# Patient Record
Sex: Female | Born: 1960
Health system: Southern US, Community
[De-identification: ages and names within clinical notes are randomized; demographics above are authoritative.]

## PROBLEM LIST (undated history)

## (undated) DIAGNOSIS — K289 Gastrojejunal ulcer, unspecified as acute or chronic, without hemorrhage or perforation: Secondary | ICD-10-CM

## (undated) DIAGNOSIS — E785 Hyperlipidemia, unspecified: Secondary | ICD-10-CM

## (undated) DIAGNOSIS — E669 Obesity, unspecified: Secondary | ICD-10-CM

## (undated) DIAGNOSIS — K219 Gastro-esophageal reflux disease without esophagitis: Secondary | ICD-10-CM

## (undated) DIAGNOSIS — I10 Essential (primary) hypertension: Secondary | ICD-10-CM

## (undated) DIAGNOSIS — Z8719 Personal history of other diseases of the digestive system: Secondary | ICD-10-CM

## (undated) DIAGNOSIS — F329 Major depressive disorder, single episode, unspecified: Secondary | ICD-10-CM

## (undated) DIAGNOSIS — F32A Depression, unspecified: Secondary | ICD-10-CM

## (undated) DIAGNOSIS — K589 Irritable bowel syndrome without diarrhea: Secondary | ICD-10-CM

## (undated) DIAGNOSIS — K635 Polyp of colon: Secondary | ICD-10-CM

## (undated) DIAGNOSIS — K5790 Diverticulosis of intestine, part unspecified, without perforation or abscess without bleeding: Secondary | ICD-10-CM

## (undated) DIAGNOSIS — D649 Anemia, unspecified: Secondary | ICD-10-CM

## (undated) DIAGNOSIS — F419 Anxiety disorder, unspecified: Secondary | ICD-10-CM

## (undated) HISTORY — DX: Obesity, unspecified: E66.9

## (undated) HISTORY — DX: Major depressive disorder, single episode, unspecified: F32.9

## (undated) HISTORY — PX: PIP JOINT FUSION: SHX2238

## (undated) HISTORY — DX: Diverticulosis of intestine, part unspecified, without perforation or abscess without bleeding: K57.90

## (undated) HISTORY — DX: Gastrojejunal ulcer, unspecified as acute or chronic, without hemorrhage or perforation: K28.9

## (undated) HISTORY — DX: Polyp of colon: K63.5

## (undated) HISTORY — DX: Anxiety disorder, unspecified: F41.9

## (undated) HISTORY — DX: Anemia, unspecified: D64.9

## (undated) HISTORY — PX: ABDOMINAL HYSTERECTOMY: SHX81

## (undated) HISTORY — DX: Irritable bowel syndrome, unspecified: K58.9

## (undated) HISTORY — DX: Personal history of other diseases of the digestive system: Z87.19

## (undated) HISTORY — DX: Depression, unspecified: F32.A

---

## 2009-05-04 HISTORY — PX: COLONOSCOPY WITH ESOPHAGOGASTRODUODENOSCOPY (EGD): SHX5779

## 2010-02-16 ENCOUNTER — Ambulatory Visit
Admission: RE | Admit: 2010-02-16 | Discharge: 2010-02-16 | Payer: Self-pay | Source: Home / Self Care | Admitting: Emergency Medicine

## 2010-02-16 ENCOUNTER — Encounter: Payer: Self-pay | Admitting: Emergency Medicine

## 2010-02-16 DIAGNOSIS — E119 Type 2 diabetes mellitus without complications: Secondary | ICD-10-CM | POA: Insufficient documentation

## 2010-02-16 DIAGNOSIS — K219 Gastro-esophageal reflux disease without esophagitis: Secondary | ICD-10-CM

## 2010-02-16 DIAGNOSIS — M25561 Pain in right knee: Secondary | ICD-10-CM

## 2010-02-16 DIAGNOSIS — E1140 Type 2 diabetes mellitus with diabetic neuropathy, unspecified: Secondary | ICD-10-CM

## 2010-02-16 DIAGNOSIS — I10 Essential (primary) hypertension: Secondary | ICD-10-CM

## 2010-02-16 DIAGNOSIS — M25569 Pain in unspecified knee: Secondary | ICD-10-CM | POA: Insufficient documentation

## 2010-02-16 DIAGNOSIS — M25562 Pain in left knee: Secondary | ICD-10-CM

## 2010-02-16 DIAGNOSIS — E785 Hyperlipidemia, unspecified: Secondary | ICD-10-CM

## 2010-02-16 DIAGNOSIS — F3289 Other specified depressive episodes: Secondary | ICD-10-CM | POA: Insufficient documentation

## 2010-02-16 DIAGNOSIS — F329 Major depressive disorder, single episode, unspecified: Secondary | ICD-10-CM | POA: Insufficient documentation

## 2010-02-16 HISTORY — DX: Pain in left knee: M25.562

## 2010-02-16 HISTORY — DX: Major depressive disorder, single episode, unspecified: F32.9

## 2010-02-16 HISTORY — DX: Other specified depressive episodes: F32.89

## 2010-02-16 HISTORY — DX: Type 2 diabetes mellitus with diabetic neuropathy, unspecified: E11.40

## 2010-02-16 HISTORY — DX: Essential (primary) hypertension: I10

## 2010-02-16 HISTORY — DX: Gastro-esophageal reflux disease without esophagitis: K21.9

## 2010-02-16 HISTORY — DX: Pain in right knee: M25.561

## 2010-02-16 HISTORY — DX: Hyperlipidemia, unspecified: E78.5

## 2010-02-22 NOTE — Assessment & Plan Note (Signed)
Summary: INJURY TO KNEE room 4   Vital Signs:  Patient Profile:   50 Years Old Female CC:      knee injury Height:     60 inches Weight:      166.50 pounds O2 Sat:      95 % O2 treatment:    Room Air Temp:     98.7 degrees F oral Pulse rate:   88 / minute Resp:     16 per minute BP sitting:   123 / 83  (left arm) Cuff size:   regular  Pt. in pain?   yes    Location:   knee    Intensity:   6    Type:       burning  Vitals Entered By: Clemens Catholic LPN (February 16, 2010 11:16 AM)                   Updated Prior Medication List: NEXIUM 40 MG CPDR (ESOMEPRAZOLE MAGNESIUM)  CELEXA 40 MG TABS (CITALOPRAM HYDROBROMIDE)  METFORMIN HCL 500 MG TABS (METFORMIN HCL)  VICTOZA 18 MG/3ML SOLN (LIRAGLUTIDE)  MAXZIDE 75-50 MG TABS (TRIAMTERENE-HCTZ)  TARKA 1-240 MG CR-TABS (TRANDOLAPRIL-VERAPAMIL HCL)  TOPROL XL 25 MG XR24H-TAB (METOPROLOL SUCCINATE)  LOVASTATIN 20 MG TABS (LOVASTATIN)   Current Allergies: ! AMPICILLINHistory of Present Illness History from: patient Chief Complaint: knee injury History of Present Illness: L knee injury at work.  Was walking down the hall, turned the corner, and while pivoting felt pain.  Immediate pain described as constant dull burning, worse with movement.  Mild swelling later on.  Has been icing and resting and using Ibu 600 which is helping.  No locking, popping, giving way.  REVIEW OF SYSTEMS Constitutional Symptoms      Denies fever, chills, night sweats, weight loss, weight gain, and fatigue.  Eyes       Complains of glasses.      Denies change in vision, eye pain, eye discharge, contact lenses, and eye surgery. Ear/Nose/Throat/Mouth       Denies hearing loss/aids, change in hearing, ear pain, ear discharge, dizziness, frequent runny nose, frequent nose bleeds, sinus problems, sore throat, hoarseness, and tooth pain or bleeding.  Respiratory       Denies dry cough, productive cough, wheezing, shortness of breath, asthma, bronchitis, and  emphysema/COPD.  Cardiovascular       Denies murmurs, chest pain, and tires easily with exhertion.    Gastrointestinal       Denies stomach pain, nausea/vomiting, diarrhea, constipation, blood in bowel movements, and indigestion. Genitourniary       Denies painful urination, kidney stones, and loss of urinary control. Neurological       Denies paralysis, seizures, and fainting/blackouts. Musculoskeletal       Denies muscle pain, joint pain, joint stiffness, decreased range of motion, redness, swelling, muscle weakness, and gout.  Skin       Denies bruising, unusual mles/lumps or sores, and hair/skin or nail changes.  Psych       Denies mood changes, temper/anger issues, anxiety/stress, speech problems, depression, and sleep problems. Other Comments: pt states that she injured her  LT knee yesterday. she was walking, turned the corner and heard a pop sound. pain is constant dull/burning, pain level 6. with movement pain level 10, sharp pain. she applied ice immediately, continued to ice through out the night, IBF 600mg  q 6 hrs with no relief.   Past History:  Past Medical History: Depression Diabetes mellitus, type II GERD Hyperlipidemia  Hypertension  Past Surgical History: Hysterectomy  Family History: Family History Diabetes 1st degree relative  Social History: Never Smoked Alcohol use-no Drug use-no Smoking Status:  never Drug Use:  no Physical Exam General appearance: well developed, well nourished, limping gait Extremities: OA nodules DIPs Skin: no obvious rashes or lesions MSE: oriented to time, place, and person L knee: FROM but painful with flexion and bounce test, no effusion, no ecchymoses, Lachmans normal (although patient is guarding), Anterior & posterior drawer normal, McMurrays is painful, Varus & valgus stress normal.  Patella freely mobile, Clarks compression test normal.  Good alignment.   She is tender both lateral and medial joint lines, and mildly TTP  along LCL and at pes anserine bursa.  Distal NV status intact. Assessment New Problems: KNEE PAIN, LEFT, ACUTE (ICD-719.46) FAMILY HISTORY DIABETES 1ST DEGREE RELATIVE (ICD-V18.0) HYPERTENSION (ICD-401.9) HYPERLIPIDEMIA (ICD-272.4) GERD (ICD-530.81) DIABETES MELLITUS, TYPE II (ICD-250.00) DEPRESSION (ICD-311)   Plan New Orders: New Patient Level III [99203] T-DG Knee Complete 4 Views*L* [16109] Planning Comments:   Ice, rest, compression, elevation.  Motrin as needed. Will refer to Dr. Margaretha Sheffield sports med for eval next week.  I suspect that she has a meniscal injury. Will get started with PT in the meantime Xray today is normal other than some mild medial compartment narrowing   The patient and/or caregiver has been counseled thoroughly with regard to medications prescribed including dosage, schedule, interactions, rationale for use, and possible side effects and they verbalize understanding.  Diagnoses and expected course of recovery discussed and will return if not improved as expected or if the condition worsens. Patient and/or caregiver verbalized understanding.   Orders Added: 1)  New Patient Level III [99203] 2)  T-DG Knee Complete 4 Views*L* [60454]

## 2010-02-22 NOTE — Letter (Signed)
Summary: Internal Other  Internal Other   Imported By: Dannette Barbara 02/16/2010 12:31:44  _____________________________________________________________________  External Attachment:    Type:   Image     Comment:   External Document

## 2011-02-02 ENCOUNTER — Emergency Department
Admission: EM | Admit: 2011-02-02 | Discharge: 2011-02-02 | Disposition: A | Discharge status: Home / Self Care | Payer: 59 | Source: Home / Self Care | Attending: Emergency Medicine | Admitting: Emergency Medicine

## 2011-02-02 ENCOUNTER — Emergency Department: Admit: 2011-02-02 | Discharge: 2011-02-02 | Disposition: A | Discharge status: Home / Self Care | Payer: 59

## 2011-02-02 DIAGNOSIS — M25539 Pain in unspecified wrist: Secondary | ICD-10-CM

## 2011-02-02 DIAGNOSIS — M25579 Pain in unspecified ankle and joints of unspecified foot: Secondary | ICD-10-CM

## 2011-02-02 DIAGNOSIS — M25532 Pain in left wrist: Secondary | ICD-10-CM

## 2011-02-02 DIAGNOSIS — M79609 Pain in unspecified limb: Secondary | ICD-10-CM

## 2011-02-02 DIAGNOSIS — M25571 Pain in right ankle and joints of right foot: Secondary | ICD-10-CM

## 2011-02-02 DIAGNOSIS — M79605 Pain in left leg: Secondary | ICD-10-CM

## 2011-02-02 DIAGNOSIS — M545 Low back pain, unspecified: Secondary | ICD-10-CM

## 2011-02-02 HISTORY — DX: Hyperlipidemia, unspecified: E78.5

## 2011-02-02 HISTORY — DX: Gastro-esophageal reflux disease without esophagitis: K21.9

## 2011-02-02 HISTORY — DX: Essential (primary) hypertension: I10

## 2011-02-02 MED ORDER — CYCLOBENZAPRINE HCL 10 MG PO TABS
10.0000 mg | ORAL_TABLET | Freq: Three times a day (TID) | ORAL | Status: DC | PRN
Start: 2011-02-02 — End: 2011-02-12

## 2011-02-02 MED ORDER — TRAMADOL HCL 50 MG PO TABS: 50.0000 mg | ORAL_TABLET | Freq: Three times a day (TID) | ORAL | Status: AC | PRN

## 2011-02-02 NOTE — ED Notes (Signed)
Fell in [place] this am

## 2011-02-02 NOTE — ED Provider Notes (Signed)
History     CSN: [id number]  Arrival date & time 02/02/11  1417   First MD Initiated Contact with Patient 02/02/11 1448      Chief Complaint  Patient presents with  . Fall    (Consider location/radiation/quality/duration/timing/severity/associated sxs/prior treatment) HPI She was walking to the front door of [employer] today about 2 hours ago and fell on a wet floor. When she fell hurt left leg went forward in her shin hit the shopping cart. Her right leg went backwards and she feels that she twisted her ankle. She also feels some tightness in her lower back. What most concerned her is her left wrist pain. She has recently had left wrist and left fifth finger surgery and just got out of a cast. Her pain was baseline 3/10 but has since increased to about 7/10 after the injury. She is here and would like to have that left wrist checked out and x-rayed. She did not hit her head, no headache or loss of consciousness. She was able to get up and walk away on her own power.  Past Medical History  Diagnosis Date  . Hypertension   . Diabetes mellitus   . Hyperlipidemia   . Gastro-esophageal reflux     Past Surgical History  Procedure Date  . Pip joint fusion     pinky  . Abdominal hysterectomy     Family History  Problem Relation Age of Onset  . Diabetes Mother   . Heart attack Father     History  Substance Use Topics  . Smoking status: Never Smoker   . Smokeless tobacco: Not on file  . Alcohol Use: No    OB History    Grav Para Term Preterm Abortions TAB SAB Ect Mult Living                  Review of Systems  Allergies  Ampicillin; Cefzil; and Mobic  Home Medications   Current Outpatient Rx  Name Route Sig Dispense Refill  . CITALOPRAM HYDROBROMIDE 40 MG PO TABS Oral Take 40 mg by mouth daily.    Marland Kitchen ESOMEPRAZOLE MAGNESIUM 40 MG PO CPDR Oral Take 40 mg by mouth daily before breakfast.    . FENOFIBRATE 54 MG PO TABS Oral Take 54 mg by mouth daily. Unsure of dosage      . INSULIN ASPART 100 UNIT/ML [place] SOLN Subcutaneous Inject 12 Units into the skin 3 (three) times daily before meals.    . INSULIN GLARGINE 100 UNIT/ML [place] SOLN Subcutaneous Inject 65 Units into the skin at bedtime.    Marland Kitchen LOVASTATIN 10 MG PO TABS Oral Take 10 mg by mouth at bedtime.    . TRANDOLAPRIL-VERAPAMIL HCL ER 2-240 MG PO TBCR Oral Take 1 tablet by mouth daily.    . TRIAMTERENE-HCTZ 75-50 MG PO TABS Oral Take 1 tablet by mouth daily.    . CYCLOBENZAPRINE HCL 10 MG PO TABS Oral Take 1 tablet (10 mg total) by mouth 3 (three) times daily as needed for muscle spasms. 15 tablet 0  . TRAMADOL HCL 50 MG PO TABS Oral Take 1 tablet (50 mg total) by mouth every 8 (eight) hours as needed for pain. 24 tablet 0    BP 136/85  Pulse 77  Temp(Src) 97.9 F (36.6 C) (Oral)  Resp 20  Ht 5' (1.524 m)  Wt 173 lb (78.472 kg)  BMI 33.79 kg/m2  SpO2 98%  Physical Exam  Nursing note and vitals reviewed. Constitutional: She is oriented to  person, place, and time. She appears well-developed and well-nourished.  HENT:  Head: Normocephalic and atraumatic.  Eyes: No scleral icterus.  Neck: Neck supple.  Cardiovascular: Regular rhythm and normal heart sounds.   Pulmonary/Chest: Effort normal and breath sounds normal. No respiratory distress.  Musculoskeletal:       R ankle/foot: FROM, +TTP ATFL and mild anterior ankle.   No TTP medial/lateral malleolus, navicular, base of 5th, calcaneus, Achilles, proximal fibula.  No swelling.  No ecchymoses.  Distal neurovascular status is intact.   Right shin examination shows tenderness and ecchymoses on the anterior aspect. Mild bony tenderness on the anterior tibia, no crepitus. Her gait is normal.  Lower back examination demonstrates a mild tenderness and spasm of the paraspinal muscles. No bony central tenderness. Straight leg raise negative.  Left wrist examination demonstrates some tenderness around the snuffbox and on the radial aspect around the [facility] joint. There  are multiple surgical scars. Her range of motion is limited by her original disability per her.  The distal neurovascular status of all extremities is intact and normal.  Neurological: She is alert and oriented to person, place, and time.  Skin: Skin is warm and dry.  Psychiatric: She has a normal mood and affect. Her speech is normal.    ED Course  Procedures (including critical care time)  Labs Reviewed - No data to display Dg Wrist Complete Left  02/02/2011  *RADIOLOGY REPORT*  Clinical Data: Fall  LEFT WRIST - COMPLETE 3+ VIEW  Comparison: None.  Findings: Status post resection of the trapezium.  Plate and screws are present within the proximal phalanx of the small finger.  No acute fracture or dislocation.  IMPRESSION: No acute bony pathology.  Original Report Authenticated By: Donavan Burnet, M.D.     1. Left wrist pain   2. Right ankle pain   3. Lumbago   4. Left leg pain       MDM   An x-ray was obtained today of her left wrist and is read by the radiologist as above. I've encouraged her to use rest, ice, elevation, and to put her wrist splint on for the next few days. I have also advised her to call her orthopedist and let them know that she fell and injured her wrist. For her other injuries such as the left shin contusion, ankle pain, and lower back pain, I do not feel that we need to get x-rays today. The patient agrees with me and states that she would only like to x-ray the left wrist because that is what she is most worried about since she recently had surgery on it. However, in a few days if she is noticing continued pain in any not improvement and the others, she should followup with her physician to have additional x-rays done. If there is going to be any kind of legal action, that x-rays would also be appropriate at that time or sooner. In the meantime, I believe these are just contusions and mild strains and I would expect that she will be sore for a few days at least.   Rx for Tramadol given for pain and Flexeril given if needed for her back.  Should also use a heating pad for her back.  Lily Kocher, MD 02/02/11 1550

## 2011-05-27 ENCOUNTER — Encounter: Payer: Self-pay | Admitting: Emergency Medicine

## 2011-05-27 ENCOUNTER — Emergency Department
Admission: EM | Admit: 2011-05-27 | Discharge: 2011-05-27 | Disposition: A | Discharge status: Home / Self Care | Payer: 59 | Source: Home / Self Care | Attending: Family Medicine | Admitting: Family Medicine

## 2011-05-27 DIAGNOSIS — M940 Chondrocostal junction syndrome [Tietze]: Secondary | ICD-10-CM

## 2011-05-27 DIAGNOSIS — J069 Acute upper respiratory infection, unspecified: Secondary | ICD-10-CM

## 2011-05-27 MED ORDER — GUAIFENESIN-CODEINE 100-10 MG/5ML PO SYRP: 10.0000 mL | ORAL_SOLUTION | Freq: Every day | ORAL | Status: AC

## 2011-05-27 MED ORDER — SULFAMETHOXAZOLE-TRIMETHOPRIM 800-160 MG PO TABS: 1.0000 | ORAL_TABLET | Freq: Two times a day (BID) | ORAL | Status: AC

## 2011-05-27 NOTE — Discharge Instructions (Signed)
Take plain Mucinex (guaifenesin) twice daily for cough and congestion.  Increase fluid intake, rest. May use Afrin nasal spray (or generic oxymetazoline) twice daily for about 5 days.  Also recommend using saline nasal spray several times daily and saline nasal irrigation (AYR is a common brand) Stop all antihistamines for now, and other non-prescription cough/cold preparations. May take Ibuprofen 200mg , 4 tabs every 8 hours with food for chest/sternum discomfort. Recommend a Tdap when well.  Follow-up with family doctor if not improving 7 to 10 days.

## 2011-05-27 NOTE — ED Provider Notes (Signed)
History     CSN: [id number]  Arrival date & time 05/27/11  1548   First MD Initiated Contact with Patient 05/27/11 [phone]      Chief Complaint  Patient presents with  . URI      HPI Comments: Patient complains of approximately 4 day history of gradually progressive URI symptoms beginning with a mild sore throat (now improved), followed by progressive nasal congestion.  A cough started about 2 days ago.  Complains of fatigue and initial myalgias.  Cough is now worse at night and generally non-productive during the day.  There has been no pleuritic pain but she has tightness in her anterior chest.  No wheezing or shortness of breath.  She sometimes coughs until she gags.  She notes that she has a similar respiratory illness about 3 to 4 weeks ago.  She does not remember her last tetanus shot.   The history is provided by the patient.    Past Medical History  Diagnosis Date  . Hypertension   . Diabetes mellitus   . Hyperlipidemia   . Gastro-esophageal reflux     Past Surgical History  Procedure Date  . Pip joint fusion     pinky  . Abdominal hysterectomy     Family History  Problem Relation Age of Onset  . Diabetes Mother   . Heart attack Father     History  Substance Use Topics  . Smoking status: Never Smoker   . Smokeless tobacco: Not on file  . Alcohol Use: No    OB History    Grav Para Term Preterm Abortions TAB SAB Ect Mult Living                  Review of Systems + sore throat + cough No pleuritic pain but has tightness in anterior chest No wheezing + nasal congestion + post-nasal drainage + sinus pain/pressure No itchy/red eyes ? earache No hemoptysis No SOB No fever/chills No nausea No vomiting No abdominal pain No diarrhea No urinary symptoms No skin rashes + fatigue + myalgias + headache Used OTC meds without relief (Robitussin)  Allergies  Ampicillin; Cefprozil; and Meloxicam  Home Medications   Current Outpatient Rx  Name Route  Sig Dispense Refill  . CITALOPRAM HYDROBROMIDE 40 MG PO TABS Oral Take 40 mg by mouth daily.    Marland Kitchen ESOMEPRAZOLE MAGNESIUM 40 MG PO CPDR Oral Take 40 mg by mouth daily before breakfast.    . FENOFIBRATE 54 MG PO TABS Oral Take 54 mg by mouth daily. Unsure of dosage    . GUAIFENESIN-CODEINE 100-10 MG/5ML PO SYRP Oral Take 10 mLs by mouth at bedtime. for cough 120 mL 0  . INSULIN ASPART 100 UNIT/ML [place] SOLN Subcutaneous Inject 12 Units into the skin 3 (three) times daily before meals.    . INSULIN GLARGINE 100 UNIT/ML [place] SOLN Subcutaneous Inject 65 Units into the skin at bedtime.    Marland Kitchen LOVASTATIN 10 MG PO TABS Oral Take 10 mg by mouth at bedtime.    . SULFAMETHOXAZOLE-TRIMETHOPRIM 800-160 MG PO TABS Oral Take 1 tablet by mouth 2 (two) times daily. 14 tablet 0  . TRANDOLAPRIL-VERAPAMIL HCL ER 2-240 MG PO TBCR Oral Take 1 tablet by mouth daily.    . TRIAMTERENE-HCTZ 75-50 MG PO TABS Oral Take 1 tablet by mouth daily.      BP 135/85  Pulse 80  Temp(Src) 98.6 F (37 C) (Oral)  Resp 16  Ht 5' (1.524 m)  Wt 178 lb (  80.74 kg)  BMI 34.76 kg/m2  SpO2 97%  Physical Exam Nursing notes and Vital Signs reviewed. Appearance:  Patient appears stated age, and in no acute distress.  Patient is obese (BMI 34.8) Eyes:  Pupils are equal, round, and reactive to light and accomodation.  Extraocular movement is intact.  Conjunctivae are not inflamed  Ears:  Canals normal.  Tympanic membranes normal.  Nose:  Mildly congested turbinates.  Maxillary sinus tenderness is present.  Pharynx:  Normal Neck:  Supple.   Tender shotty anterior/posterior nodes are palpated bilaterally  Lungs:  Clear to auscultation.  Breath sounds are equal.  Chest:  Distinct tenderness to palpation over the mid-sternum.  Heart:  Regular rate and rhythm without murmurs, rubs, or gallops.  Abdomen:  Nontender without masses or hepatosplenomegaly.  Bowel sounds are present.  No CVA or flank tenderness.  Extremities:  No edema.  No calf  tenderness Skin:  No rash present.   ED Course  Procedures  none      1. Acute upper respiratory infections of unspecified site   2. Costochondritis, acute       MDM  With a second respiratory illness in one month, will begin Bactrim DS.  Rx for cough suppressant at bedtime. Take plain Mucinex (guaifenesin) twice daily for cough and congestion.  Increase fluid intake, rest. May use Afrin nasal spray (or generic oxymetazoline) twice daily for about 5 days.  Also recommend using saline nasal spray several times daily and saline nasal irrigation (AYR is a common brand) Stop all antihistamines for now, and other non-prescription cough/cold preparations. May take Ibuprofen 200mg , 4 tabs every 8 hours with food for chest/sternum discomfort. Recommend a Tdap when well.  Follow-up with family doctor if not improving 7 to 10 days.        Lattie Haw, MD 05/27/11 [phone]

## 2011-05-27 NOTE — ED Notes (Signed)
Cough, Congestion, Headache, sore throat, green mucus x 5 days

## 2012-09-15 ENCOUNTER — Ambulatory Visit (HOSPITAL_COMMUNITY): Payer: 59 | Admitting: Behavioral Health

## 2012-09-17 ENCOUNTER — Encounter (HOSPITAL_COMMUNITY): Payer: Self-pay | Admitting: Behavioral Health

## 2012-09-17 ENCOUNTER — Ambulatory Visit (INDEPENDENT_AMBULATORY_CARE_PROVIDER_SITE_OTHER): Payer: 59 | Admitting: Behavioral Health

## 2012-09-17 DIAGNOSIS — F339 Major depressive disorder, recurrent, unspecified: Secondary | ICD-10-CM

## 2012-09-17 DIAGNOSIS — F331 Major depressive disorder, recurrent, moderate: Secondary | ICD-10-CM

## 2012-09-17 NOTE — Progress Notes (Signed)
Presenting Problem Chief Complaint: The client presents with multiple stressors. Her husband is out of work and due to severe anxiety. Her daughter has had issues with substance abuse. 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 There are also financial stressors as her husband was not able to work currently  What are the main stressors in your life right now? Depression  2  How long have you had these symptoms?: On and off for the last 4 or 5 years   Previous mental health services Have you ever been treated for a mental health problem? Yes  If Yes, when? Depression , where? , Health, by whom? Dr. Orson Aloe   Are you currently seeing a therapist or counselor? No If Yes, whom?   Have you ever had a mental health hospitalization? No If Yes, when?  , where? , why? , how many times?   Have you ever been treated with medication for a mental health problem? Yes If Yes, please list as completely as possible (name of medication, reason prescribed, and response:  depression see note in epic   Have you ever had suicidal thoughts or attempted suicide? None reported If Yes, when?   Describe   Risk factors for Suicide Demographic factors:  Caucasian Current mental status: Suicidal ideation  Loss factors: Financial problems/change in socioeconomic status Historical factors: No history or family history of suicide  Risk Reduction factors: Living with another person, especially a relative Clinical factors:  depression Cognitive features that contribute to risk:     SUICIDE RISK:  Minimal: No identifiable suicidal ideation.  Patients presenting with no risk factors but with morbid ruminations; may be classified as minimal risk based on the severity of the depressive symptoms   Medical history Medical treatment and/or problems: Yes If Yes, please explain see note in epic  Name of primary care physician/last physical exam:  Dr.  Orson Aloe  Chronic pain issues: No If Yes, please explain  Allergies: None reported If yes, what medications are you allergic to and what happened when taking the medication?    Current medications:  see note in epic  Prescribed by:  see note in epic   Is there any history of mental health problems or substance abuse in your family? No If Yes, please explain (include information on parents, siblings, aunts/uncles, grandparents, cousins, etc.):  Has anyone in your family been hospitalized for mental health problems?  If Yes, please explain (including who, where, and for what length of time):    Social/family history Who lives in your current household?  the client, her husband Tonya Chandler, and 40 year old daughter Tonya Chandler history: Have you ever been in the [employer]? No If Yes, when?  for how long?   Were you ever in active combat?  If Yes, when?  for how long?  Were there any lasting effects on you?  If Yes, please explain:   Religious/spiritual involvement:  What Religion are you?  Christian   Family of origin (childhood history)  Where were you born?  [facility] [place]  Where did you grow up? [place] Describe the household where you grew up: The client reports a good childhood with both parents being together  Do you have siblings, step/half siblings? Yes If Yes, please list names, sex and ages:  has 3 brothers 2 of which are older one of which his younger   Are your parents separated/divorced? No If Yes, approximately when?  clients father is deceased   Are you presently: Married How many times have you been married?  once  Dates of previous marriages:  nonapplicable  Do you have any concerns regarding marriage? No If Yes, please explain:  client is very supportive of her husband is going to a difficult time   Do you have any children? Yes If Yes, how many?  two Please list their sexes and ages:  Tonya Chandler who is 49 and Tonya Chandler who is 11  Social supports  (personal and professional):  the clients brothers, her husband and adult children, and her husband's brothers Education How many grades have you completed? college graduate Do you hold any Degrees? Yes If Yes, in what?   From where?  What were your special talents/interests in school?   Did you have any problems in school? No If Yes, were these problems behavioral, attention, or due to learning difficulties?  Were any medications ever prescribed for these problems?  If Yes, what were the medications?    Employment (financial issues) Do you work? 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  26 years   Name of employer:  [employer]  Do you enjoy your present job? Yes What is your previous work history?  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 No If Yes, please explain:    Legal history Do you have any current legal issues? no If yes, please describe:   Do you have any [ast legal issues? If yes, please describe:   Trauma/Abuse history: Have you ever been exposed to any form of abuse? No If Yes:   Have you ever been exposed to something traumatic? Yes If yes, please described:  the client reports that her father's death was traumatic for her. She described herself as a" daddy's girl." She indicated that he died 21 years ago and just in the past year as she found come to terms with it. She stated that she was with him on the day that he died and she and other family members simply thought he had the flu  when he had a major heart by the time that she got to him. She was told by the doctors at his heart exploded and that they could not say to him whether or not she had gotten into the hospital sooner. Substance use Do you use Caffeine?  If Yes, what type?  How often?   Do you use Nicotine? No What type?  Packs per day  How many years at this  frequency?   Do you use Alcohol? No If Yes, what type?  Frequency?   At what age did you take your first drink?  nonapplicable  Was this accepted by your family? NA  When was your last drink?  client does not drink  How much?   Have you ever experienced any form of withdrawal symptoms, i.e., Hallucinations, Tremors, Excessive Sweating, or Nausea or Vomiting? No If Yes, please explain:   Have you ever experienced blackouts? None reported If Yes, how frequently?   Have you ever had a DWI/DUI? No If Yes, when?   Do you have any legal charges pending involving substance abuse? No If Yes, please explain:   Have you ever used illicit drugs or taken more than prescribed? None reported If Yes, what type?  Frequency:   Date of last usage:  Have you ever experienced any withdrawal symptoms as listed above? No If Yes, please explain:   If you are not using presently, have you ever used in the past? None reported  If Yes, what types of Alcohol or other substances have you used?  Frequency  Last used:   Have you ever received treatment for Alcohol or Substance Abuse problems? No  Inpatient? No Outpatient? No What were the dates of treatment?  Where?   Have you ever been involved in any Recovery or Support Programs? No  If Yes, where?   Are you aware of your triggers to drink or use? No If Yes, please explain:   Mental Status: General Appearance Tonya Chandler:  Neat Eye Contact:  Good Motor Behavior:  Normal Speech:  Normal Level of Consciousness:  Alert Mood:  Depressed Affect:  Appropriate Anxiety Level:  Minimal Thought Process:  Coherent Thought Content:   Perception:  Normal Judgment:  Good Insight:  Present Cognition:  Orientation time Sleep:  client reports that she sleeps fairly well   Diagnosis AXIS I 296.32  AXIS II Deferred  AXIS III Past Medical History  Diagnosis Date  . Hypertension   . Diabetes mellitus   . Hyperlipidemia   . Gastro-esophageal  reflux     AXIS IV problems with primary support group/occupational  AXIS V 51-60 moderate symptoms    Plan: To provide supportive therapy for the client in dealing with stressors and depression.   __________________________________________ Signature/Date

## 2012-10-01 ENCOUNTER — Ambulatory Visit (INDEPENDENT_AMBULATORY_CARE_PROVIDER_SITE_OTHER): Payer: 59 | Admitting: Behavioral Health

## 2012-10-01 ENCOUNTER — Encounter (HOSPITAL_COMMUNITY): Payer: Self-pay | Admitting: Behavioral Health

## 2012-10-01 DIAGNOSIS — F331 Major depressive disorder, recurrent, moderate: Secondary | ICD-10-CM

## 2012-10-01 NOTE — Progress Notes (Signed)
   THERAPIST PROGRESS NOTE  Session Time: 10:00  Participation Level: Active  Behavioral Response: CasualAlertDepressed  Type of Therapy: Individual Therapy  Treatment Goals addressed: Coping  Interventions: CBT  Summary: Tonya Chandler is a 52 y.o. female who presents with depression.   Suicidal/Homicidal: Nowithout intent/plan  Therapist Response: The client presents with depression but also with the termination. She has multiple triggers including her husband who is struggling with moderate to severe anxiety, a difficult job which requires a substantial amount of travel, a part-time job related to the [employer], and a daughter who is struggling with overcoming and addiction..  The client expresses appreciation for supportive therapy. We did talk about ways in which she can find some time for herself for relaxation. She enjoys cooking. We also talked about scheduling time for hiking and, walking nature trails, getting a massage. She indicates that she has a small group for her church which is very supportive and has membership that she feel she can be very open with.  She indicates that one of her biggest stressors currently in his frustration with her daughter's lack of motivation. She indicates that her daughter has taking money from him as well as requested some of the clients medication. I told her that setting a firm limit with her daughter would be better for both of them. Asked her to not allow the daughter to have her do that credit card, do not give her any of her medication, and to directly pay for any of the daughter's school bills without allowing her any cash or access to dinner or credit card.  She reports that she is medication compliant and is sleeping fairly well.  Plan: Return again in 3 weeks.  Diagnosis: Axis I: 296.32    Axis II: Deferred    French Ana, [facility] 10/01/2012

## 2012-10-15 ENCOUNTER — Encounter (HOSPITAL_COMMUNITY): Payer: Self-pay | Admitting: Behavioral Health

## 2012-10-15 ENCOUNTER — Ambulatory Visit (INDEPENDENT_AMBULATORY_CARE_PROVIDER_SITE_OTHER): Payer: 59 | Admitting: Behavioral Health

## 2012-10-15 DIAGNOSIS — F331 Major depressive disorder, recurrent, moderate: Secondary | ICD-10-CM

## 2012-10-15 NOTE — Progress Notes (Signed)
   THERAPIST PROGRESS NOTE  Session Time: 10:00  Participation Level: Active  Behavioral Response: CasualAlertDepressed  Type of Therapy: Individual Therapy  Treatment Goals addressed: Coping  Interventions: CBT  Summary: Tonya Chandler is a 52 y.o. female who presents with depression.   Suicidal/Homicidal: Nowithout intent/plan  Therapist Response: The client presents with depression and anxiety. She indicates that her husband husband's health issues in particular anxiety have been very difficult. He is waiting to hear from his insurance company to see if he has been approved for disability and indicates that has been difficult for everyone. She also indicates that she struggles with her daughter's choices and lack of willingness to take responsibility or actively look for work. The client reports that her work is going very well and at times that is a play she can rest and escape. She does admit to being overwhelmed indicates that she has no thoughts of hurting herself or anyone else. We spent significant time talking about coping skills for the client, the client taking time for herself, and how she can set healthy limits.  Plan: Return again in 2 weeks.  Diagnosis: Axis I: 296.32    Axis II: Deferred    French Ana, [facility] 10/15/2012

## 2012-11-02 ENCOUNTER — Ambulatory Visit (HOSPITAL_COMMUNITY): Payer: Self-pay | Admitting: Behavioral Health

## 2012-11-05 ENCOUNTER — Ambulatory Visit (INDEPENDENT_AMBULATORY_CARE_PROVIDER_SITE_OTHER): Payer: 59 | Admitting: Behavioral Health

## 2012-11-05 ENCOUNTER — Encounter (HOSPITAL_COMMUNITY): Payer: Self-pay | Admitting: Behavioral Health

## 2012-11-05 DIAGNOSIS — F331 Major depressive disorder, recurrent, moderate: Secondary | ICD-10-CM

## 2012-11-05 NOTE — Progress Notes (Signed)
   THERAPIST PROGRESS NOTE  Session Time: 10:00  Participation Level: Active  Behavioral Response: Well GroomedAlertpleasant  Type of Therapy: Individual Therapy  Treatment Goals addressed: Coping  Interventions: CBT  Summary: Tonya Chandler is a 52 y.o. female who presents with depression.   Suicidal/Homicidal: Nowithout intent/plan  Therapist Response: The client stated that she was fired from her job very presented very calmly and at peace. She reported that she had been with the company 26 years and did not like the way the dismissal was handled but is not angry and is not better. She indicates that she was given a fair substance and is not concerned about finding work up immediately. 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 As she restricts that her husband found out that he had been approved for short-term disability to that also reduced a significant amount of stress from she and her family also. The client does indicate that she knows she will have to look for work fairly soon but in the meantime she is looking for to time with her family and an upcoming mission trip. She says that she does not have any major financial concerns. She contracts for safety having no thoughts of hurting herself or anyone else.  Plan: Return again in 3 weeks.  Diagnosis: Axis I: 296.32    Axis II: Deferred    Jay Haskew M, LPC 11/05/2012

## 2012-11-23 ENCOUNTER — Ambulatory Visit (INDEPENDENT_AMBULATORY_CARE_PROVIDER_SITE_OTHER): Payer: 59 | Admitting: Behavioral Health

## 2012-11-23 DIAGNOSIS — F331 Major depressive disorder, recurrent, moderate: Secondary | ICD-10-CM

## 2012-11-24 ENCOUNTER — Encounter (HOSPITAL_COMMUNITY): Payer: Self-pay | Admitting: Behavioral Health

## 2012-11-24 NOTE — Progress Notes (Signed)
   THERAPIST PROGRESS NOTE  Session Time: 2:00  Participation Level: Active  Behavioral Response: CasualAlertDepressed  Type of Therapy: Individual Therapy  Treatment Goals addressed: Coping  Interventions: CBT  Summary: Tonya Chandler is a 52 y.o. female who presents with depression.   Suicidal/Homicidal: Nowithout intent/plan  Therapist Response: The client presented with some frustration related to family matters. She indicates that her husband still struggles with some anxiety but feels that he is making progress. She said that her daughter is making poor decisions which has affected the whole family and created some trauma within the home. We talked about setting healthy limits with her daughter in order to protect herself.  She states that she is at peace with her job situation. She is going to a class related to the job search and had hunting opportunities this week which her former employer pay for. She states her is no financial stress now and feels confident that she will find work in an area that she is comfortable with. She is using this time to look closely what she wants to do her recognizes at this point in her work career she is looking forward to an 8-5 job in which she does not have to travel or take her job with her when she goes home.  She states that she is medication compliant and has no thoughts of hurting herself or anyone else.  Plan: Return again in 2 weeks.  Diagnosis: Axis I: 296.32    Axis II: Deferred    French Ana, [facility] 11/24/2012

## 2012-12-07 ENCOUNTER — Ambulatory Visit (INDEPENDENT_AMBULATORY_CARE_PROVIDER_SITE_OTHER): Payer: 59 | Admitting: Behavioral Health

## 2012-12-07 ENCOUNTER — Encounter (HOSPITAL_COMMUNITY): Payer: Self-pay | Admitting: Behavioral Health

## 2012-12-07 DIAGNOSIS — F331 Major depressive disorder, recurrent, moderate: Secondary | ICD-10-CM

## 2012-12-07 NOTE — Progress Notes (Signed)
   THERAPIST PROGRESS NOTE  Session Time: 11:00  Participation Level: Active  Behavioral Response: NeatAlertDepressed  Type of Therapy: Individual Therapy/grief  Treatment Goals addressed: Coping  Interventions: CBT  Summary: Tonya Chandler is a 52 y.o. female who presents with depression.   Suicidal/Homicidal: Nowithout intent/plan  Therapist Response: The client presents with depression indicated that her mother died last week. She indicated that she had been increasing stages of dementia for the past few years but had gone downhill significantly over the past few weeks and only live 6 days after being transferred to hospice. We did help the client process her grief issues related to her mother's death. She indicated that she typically doesn't requesting busy and so she is beginning this week to get back to looking for active work for herself. She does report some stressors with her daughter's he gave years but indicates that she and her husband are doing a much better job of setting limits for her daughter.  The client reports that she is medication compliant and that she has no thoughts of hurting herself or anyone else. She reports a very good support group with her husband and Bible study group as well as her church and extended family.  Plan: Return again in 3 weeks.  Diagnosis: Axis I: 296.32    Axis II: Deferred    French Ana, [facility] 12/07/2012

## 2012-12-21 ENCOUNTER — Ambulatory Visit (HOSPITAL_COMMUNITY): Payer: Self-pay | Admitting: Behavioral Health

## 2013-02-18 ENCOUNTER — Encounter (HOSPITAL_COMMUNITY): Payer: Self-pay | Admitting: Behavioral Health

## 2013-02-18 NOTE — Progress Notes (Unsigned)
Patient ID: Tonya Chandler, female   DOB: 09-10-1960, 53 y.o.   MRN: [id number]   Outpatient Therapist Discharge Summary  Tyria Springer    03-17-1960   Admission Date:    Discharge Date:   Reason for Discharge:  Client reported that she is doing well and did not feel the need to continue. Diagnosis:  Axis I:  296.32  Axis II:  deferred  Axis III:  None reported  Axis IV:    Axis V:    Comments:    Sabas Sous

## 2013-04-18 ENCOUNTER — Encounter: Payer: Self-pay | Admitting: Emergency Medicine

## 2013-04-18 ENCOUNTER — Emergency Department (INDEPENDENT_AMBULATORY_CARE_PROVIDER_SITE_OTHER)
Admission: EM | Admit: 2013-04-18 | Discharge: 2013-04-18 | Disposition: A | Discharge status: Home / Self Care | Payer: 59 | Source: Home / Self Care | Attending: Family Medicine | Admitting: Family Medicine

## 2013-04-18 DIAGNOSIS — H9312 Tinnitus, left ear: Secondary | ICD-10-CM

## 2013-04-18 DIAGNOSIS — H9319 Tinnitus, unspecified ear: Secondary | ICD-10-CM

## 2013-04-18 MED ORDER — PREDNISONE 20 MG PO TABS: 20.0000 mg | ORAL_TABLET | Freq: Two times a day (BID) | ORAL | Status: DC

## 2013-04-18 NOTE — Discharge Instructions (Signed)
Tinnitus  Sounds you hear in your ears and coming from within the ear is called tinnitus. This can be a symptom of many ear disorders. It is often associated with hearing loss.   Tinnitus can be seen with:  · Infections.  · Ear blockages such as wax buildup.  · Meniere's disease.  · Ear damage.  · Inherited.  · Occupational causes.  While irritating, it is not usually a threat to health. When the cause of the tinnitus is wax, infection in the middle ear, or foreign body it is easily treated. Hearing loss will usually be reversible.   TREATMENT   When treating the underlying cause does not get rid of tinnitus, it may be necessary to get rid of the unwanted sound by covering it up with more pleasant background noises. This may include music, the radio etc. There are tinnitus maskers which can be worn which produce background noise to cover up the tinnitus.  Avoid all medications which tend to make tinnitus worse such as alcohol, caffeine, aspirin, and nicotine. There are many soothing background tapes such as rain, ocean, thunderstorms, etc. These soothing sounds help with sleeping or resting.  Keep all follow-up appointments and referrals. This is important to identify the cause of the problem. It also helps avoid complications, impaired hearing, disability, or chronic pain.  Document Released: 01/07/2005 Document Revised: 04/01/2011 Document Reviewed: 08/26/2007  [employer]® Patient Information ©2014 [employer].

## 2013-04-18 NOTE — ED Provider Notes (Signed)
CSN: [id number]     Arrival date & time 04/18/13  1453 History   First MD Initiated Contact with Patient 04/18/13 1520     Chief Complaint  Patient presents with  . Tinnitus      HPI Comments: Patient complains of one week history of ringing in her left ear which she describes as a "swishy" tone.  The tone is more obvious when she is in quiet areas.  She believes that her hearing is slightly decreased, but no earache.  She feels slightly dizzy at times.  No recent sinus congestion or URI, and she feels well otherwise.  The history is provided by the patient.    Past Medical History  Diagnosis Date  . Hypertension   . Diabetes mellitus   . Hyperlipidemia   . Gastro-esophageal reflux   . Depression   . Anxiety    Past Surgical History  Procedure Laterality Date  . Pip joint fusion      pinky  . Abdominal hysterectomy     Family History  Problem Relation Age of Onset  . Diabetes Mother   . Dementia Mother   . Heart attack Father    History  Substance Use Topics  . Smoking status: Never Smoker   . Smokeless tobacco: Never Used  . Alcohol Use: No   OB History   Grav Para Term Preterm Abortions TAB SAB Ect Mult Living                 Review of Systems No sore throat No cough No pleuritic pain No wheezing No nasal congestion No post-nasal drainage No sinus pain/pressure No itchy/red eyes ? Earache + dizziness No hemoptysis No SOB No fever/chills No nausea No vomiting No abdominal pain No diarrhea No urinary symptoms No skin rash No fatigue No myalgias + headache Used OTC meds without relief  Allergies  Ampicillin; Cefprozil; and Meloxicam  Home Medications   Current Outpatient Rx  Name  Route  Sig  Dispense  Refill  . citalopram (CELEXA) 40 MG tablet   Oral   Take 40 mg by mouth daily.         Marland Kitchen esomeprazole (NEXIUM) 40 MG capsule   Oral   Take 40 mg by mouth daily before breakfast.         . fenofibrate 54 MG tablet   Oral   Take 54  mg by mouth daily. Unsure of dosage         . insulin aspart (NOVOLOG) 100 UNIT/ML injection   Subcutaneous   Inject 12 Units into the skin 3 (three) times daily before meals.         . insulin glargine (LANTUS) 100 UNIT/ML injection   Subcutaneous   Inject 65 Units into the skin at bedtime.         . lovastatin (MEVACOR) 10 MG tablet   Oral   Take 10 mg by mouth at bedtime.         . predniSONE (DELTASONE) 20 MG tablet   Oral   Take 1 tablet (20 mg total) by mouth 2 (two) times daily. Take with food.   10 tablet   0   . trandolapril-verapamil (TARKA) 2-240 MG per tablet   Oral   Take 1 tablet by mouth daily.         Marland Kitchen triamterene-hydrochlorothiazide (MAXZIDE) 75-50 MG per tablet   Oral   Take 1 tablet by mouth daily.          BP  109/73  Pulse 76  Temp(Src) 98 F (36.7 C) (Oral)  Resp 16  Ht 5' (1.524 m)  Wt 159 lb (72.122 kg)  BMI 31.05 kg/m2  SpO2 96% Physical Exam  Nursing notes and Vital Signs reviewed. Appearance:  Patient appears stated age, and in no acute distress. Patient is obese (BMI 31.1) Eyes:  Pupils are equal, round, and reactive to light and accomodation.  Extraocular movement is intact.  Conjunctivae are not inflamed  Ears:  Canals normal.  Tympanic membranes normal.  Nose:  Mildly congested turbinates.  No sinus tenderness.   Mouth/Pharynx:  Normal Neck:  Supple.  No adenopathy or thyromegaly.  Carotids have normal upstrokes without bruits. Lungs:  Clear to auscultation.  Breath sounds are equal.  Heart:  Regular rate and rhythm without murmurs, rubs, or gallops.  Skin:  No rash present. Neurologic:  Cranial nerves 2 through 12 are normal.  Patellar, achilles, and elbow reflexes are normal.  Cerebellar function is intact (finger-to-nose and rapid alternating hand movement).  Gait and station are normal.      ED Course  Procedures  none   Labs Reviewed - Tympanogram:  Positive peak pressure left ear; normal right ear      MDM    1. Tinnitus of left ear; suspect eustachian tube dysfunction    Begin prednisone burst. Followup with ENT if not improved one week.    Kandra Nicolas, MD 04/20/13 [phone]

## 2013-04-18 NOTE — ED Notes (Signed)
Reports ringing in left ear along with some pain x 1 week; feels it is effecting her hearing.

## 2013-12-18 ENCOUNTER — Emergency Department (INDEPENDENT_AMBULATORY_CARE_PROVIDER_SITE_OTHER)
Admission: EM | Admit: 2013-12-18 | Discharge: 2013-12-18 | Disposition: A | Discharge status: Home / Self Care | Payer: 59 | Source: Home / Self Care | Attending: Emergency Medicine | Admitting: Emergency Medicine

## 2013-12-18 DIAGNOSIS — J029 Acute pharyngitis, unspecified: Secondary | ICD-10-CM

## 2013-12-18 DIAGNOSIS — B370 Candidal stomatitis: Secondary | ICD-10-CM

## 2013-12-18 LAB — POCT RAPID STREP A (OFFICE): RAPID STREP A SCREEN: NEGATIVE

## 2013-12-18 MED ORDER — FLUCONAZOLE 200 MG PO TABS: 200.0000 mg | ORAL_TABLET | Freq: Every day | ORAL | Status: AC

## 2013-12-18 NOTE — Discharge Instructions (Signed)
Candida Infection A Candida infection (also called yeast, fungus, and Monilia infection) is an overgrowth of yeast that can occur anywhere on the body. A yeast infection commonly occurs in warm, moist body areas. Usually, the infection remains localized but can spread to become a systemic infection. A yeast infection may be a sign of a more severe disease such as diabetes, leukemia, or AIDS. A yeast infection can occur in both men and women. In women, Candida vaginitis is a vaginal infection. It is one of the most common causes of vaginitis. Men usually do not have symptoms or know they have an infection until other problems develop. Men may find out they have a yeast infection because their sex partner has a yeast infection. Uncircumcised men are more likely to get a yeast infection than circumcised men. This is because the uncircumcised glans is not exposed to air and does not remain as dry as that of a circumcised glans. Older adults may develop yeast infections around dentures. CAUSES  Women  Antibiotics.  Steroid medication taken for a long time.  Being overweight (obese).  Diabetes.  Poor immune condition.  Certain serious medical conditions.  Immune suppressive medications for organ transplant patients.  Chemotherapy.  Pregnancy.  Menstruation.  Stress and fatigue.  Intravenous drug use.  Oral contraceptives.  Wearing tight-fitting clothes in the crotch area.  Catching it from a sex partner who has a yeast infection.  Spermicide.  Intravenous, urinary, or other catheters. Men  Catching it from a sex partner who has a yeast infection.  Having oral or anal sex with a person who has the infection.  Spermicide.  Diabetes.  Antibiotics.  Poor immune system.  Medications that suppress the immune system.  Intravenous drug use.  Intravenous, urinary, or other catheters. SYMPTOMS  Women  Thick, white vaginal discharge.  Vaginal itching.  Redness and  swelling in and around the vagina.  Irritation of the lips of the vagina and perineum.  Blisters on the vaginal lips and perineum.  Painful sexual intercourse.  Low blood sugar (hypoglycemia).  Painful urination.  Bladder infections.  Intestinal problems such as constipation, indigestion, bad breath, bloating, increase in gas, diarrhea, or loose stools. Men  Men may develop intestinal problems such as constipation, indigestion, bad breath, bloating, increase in gas, diarrhea, or loose stools.  Dry, cracked skin on the penis with itching or discomfort.  Jock itch.  Dry, flaky skin.  Athlete's foot.  Hypoglycemia. DIAGNOSIS  Women  A history and an exam are performed.  The discharge may be examined under a microscope.  A culture may be taken of the discharge. Men  A history and an exam are performed.  Any discharge from the penis or areas of cracked skin will be looked at under the microscope and cultured.  Stool samples may be cultured. TREATMENT  Women  Vaginal antifungal suppositories and creams.  Medicated creams to decrease irritation and itching on the outside of the vagina.  Warm compresses to the perineal area to decrease swelling and discomfort.  Oral antifungal medications.  Medicated vaginal suppositories or cream for repeated or recurrent infections.  Wash and dry the irritation areas before applying the cream.  Eating yogurt with Lactobacillus may help with prevention and treatment.  Sometimes painting the vagina with gentian violet solution may help if creams and suppositories do not work. Men  Antifungal creams and oral antifungal medications.  Sometimes treatment must continue for 30 days after the symptoms go away to prevent recurrence. HOME CARE INSTRUCTIONS  Women  Use cotton underwear and avoid tight-fitting clothing.  Avoid colored, scented toilet paper and deodorant tampons or pads.  Do not douche.  Keep your diabetes  under control.  Finish all the prescribed medications.  Keep your skin clean and dry.  Consume milk or yogurt with Lactobacillus-active culture regularly. If you get frequent yeast infections and think that is what the infection is, there are over-the-counter medications that you can get. If the infection does not show healing in 3 days, talk to your caregiver.  Tell your sex partner you have a yeast infection. Your partner may need treatment also, especially if your infection does not clear up or recurs. Men  Keep your skin clean and dry.  Keep your diabetes under control.  Finish all prescribed medications.  Tell your sex partner that you have a yeast infection so he or she can be treated if necessary. SEEK MEDICAL CARE IF:   Your symptoms do not clear up or worsen in one week after treatment.  You have an oral temperature above 102 F (38.9 C).  You have trouble swallowing or eating for a prolonged time.  You develop blisters on and around your vagina.  You develop vaginal bleeding and it is not your menstrual period.  You develop abdominal pain.  You develop intestinal problems as mentioned above.  You get weak or light-headed.  You have painful or increased urination.  You have pain during sexual intercourse. MAKE SURE YOU:   Understand these instructions.  Will watch your condition.  Will get help right away if you are not doing well or get worse. Document Released: 02/15/2004 Document Revised: 05/24/2013 Document Reviewed: 05/29/2009 [facility] Patient Information 2015 [place], [place]. This information is not intended to replace advice given to you by your health care provider. Make sure you discuss any questions you have with your health care provider. Thrush, Adult  Ritta Slot, also called oral candidiasis, is a fungal infection that develops in the mouth and throat and on the tongue. It causes white patches to form on the mouth and tongue. Ritta Slot is most common  in older adults, but it can occur at any age.  Many cases of thrush are mild, but this infection can also be more serious. Ritta Slot can be a recurring problem for people who have chronic illnesses or who take medicines that limit the body's ability to fight infection. Because these people have difficulty fighting infections, the fungus that causes thrush can spread throughout the body. This can cause life-threatening blood or organ infections. CAUSES  Ritta Slot is usually caused by a yeast called Candida albicans. This fungus is normally present in small amounts in the mouth and on other mucous membranes. It usually causes no harm. However, when conditions are present that allow the fungus to grow uncontrolled, it invades surrounding tissues and becomes an infection. Less often, other Candida species can also lead to thrush.  RISK FACTORS Ritta Slot is more likely to develop in the following people: People with an impaired ability to fight infection (weakened immune system).  Older adults.  People with HIV.  People with diabetes.  People with dry mouth (xerostomia).  Pregnant women.  People with poor dental care, especially those who have false teeth.  People who use antibiotic medicines.  SIGNS AND SYMPTOMS  Ritta Slot can be a mild infection that causes no symptoms. If symptoms develop, they may include:  A burning feeling in the mouth and throat. This can occur at the start of a thrush infection.  White patches that adhere to the mouth and tongue. The tissue around the patches may be red, raw, and painful. If rubbed (during tooth brushing, for example), the patches and the tissue of the mouth may bleed easily.  A bad taste in the mouth or difficulty tasting foods.  Cottony feeling in the mouth.  Pain during eating and swallowing. DIAGNOSIS  Your health care provider can usually diagnose thrush by looking in your mouth and asking you questions about your health.  TREATMENT  Medicines that  help prevent the growth of fungi (antifungals) are the standard treatment for thrush. These medicines are either applied directly to the affected area (topical) or swallowed (oral). The treatment will depend on the severity of the condition.  Mild Thrush Mild cases of thrush may clear up with the use of an antifungal mouth rinse or lozenges. Treatment usually lasts about 14 days.  Moderate to Severe Thrush More severe thrush infections that have spread to the esophagus are treated with an oral antifungal medicine. A topical antifungal medicine may also be used.  For some severe infections, a treatment period longer than 14 days may be needed.  Oral antifungal medicines are almost never used during pregnancy because the fetus may be harmed. However, if a pregnant woman has a rare, severe thrush infection that has spread to her blood, oral antifungal medicines may be used. In this case, the risk of harm to the mother and fetus from the severe thrush infection may be greater than the risk posed by the use of antifungal medicines.  Persistent or Recurrent Thrush For cases of thrush that do not go away or keep coming back, treatment may involve the following:  Treatment may be needed twice as long as the symptoms last.  Treatment will include both oral and topical antifungal medicines.  People with weakened immune systems can take an antifungal medicine on a continuous basis to prevent thrush infections.  It is important to treat conditions that make you more likely to get thrush, such as diabetes or HIV.  HOME CARE INSTRUCTIONS  Only take over-the-counter or prescription medicine as directed by your health care provider. Talk to your health care provider about an over-the-counter medicine called gentian violet, which kills bacteria and fungi.  Eat plain, unflavored yogurt as directed by your health care provider. Check the label to make sure the yogurt contains live cultures. This yogurt can help  healthy bacteria grow in the mouth that can stop the growth of the fungus that causes thrush.  Try these measures to help reduce the discomfort of thrush:  Drink cold liquids such as water or iced tea.  Try flavored ice treats or frozen juices.  Eat foods that are easy to swallow, such as gelatin, ice cream, or custard.  If the patches in your mouth are painful, try drinking from a straw.  Rinse your mouth several times a day with a warm saltwater rinse. You can make the saltwater mixture with 1 tsp (6 g) of salt in 8 fl oz (0.2 L) of warm water.  If you wear dentures, remove the dentures before going to bed, brush them vigorously, and soak them in a cleaning solution as directed by your health care provider.  Women who are breastfeeding should clean their nipples with an antifungal medicine as directed by their health care provider. Dry the nipples after breastfeeding. Applying lanolin-containing body lotion may help relieve nipple soreness.  SEEK MEDICAL CARE IF: Your symptoms are getting worse or are not  improving within 7 days of starting treatment.  You have symptoms of spreading infection, such as white patches on the skin outside of the mouth.  You are nursing and you have redness, burning, or pain in the nipples that is not relieved with treatment.  MAKE SURE YOU: Understand these instructions. Will watch your condition. Will get help right away if you are not doing well or get worse. Document Released: 10/03/2003 Document Revised: 10/28/2012 Document Reviewed: 08/10/2012 [facility] Patient Information 2015 [place], [place]. This information is not intended to replace advice given to you by your health care provider. Make sure you discuss any questions you have with your health care provider.

## 2013-12-18 NOTE — ED Notes (Signed)
Complains of sore throat x 2-3 days

## 2013-12-19 NOTE — ED Provider Notes (Signed)
CSN: [id number]     Arrival date & time 12/18/13  1644 History   First MD Initiated Contact with Patient 12/18/13 1701     Chief Complaint  Patient presents with  . Sore Throat     (Consider location/radiation/quality/duration/timing/severity/associated sxs/prior Treatment) Patient is a 53 y.o. female presenting with pharyngitis. The history is provided by the patient. No language interpreter was used.  Sore Throat This is a new problem. The current episode started yesterday. The problem occurs constantly. The problem has been rapidly worsening. Nothing aggravates the symptoms. She has tried nothing for the symptoms. The treatment provided no relief.  Pt has a vaginal yeast infection.   Pt reports she now has a sore throat.  Past Medical History  Diagnosis Date  . Hypertension   . Diabetes mellitus   . Hyperlipidemia   . Gastro-esophageal reflux   . Depression   . Anxiety    Past Surgical History  Procedure Laterality Date  . Pip joint fusion      pinky  . Abdominal hysterectomy     Family History  Problem Relation Age of Onset  . Diabetes Mother   . Dementia Mother   . Heart attack Father    History  Substance Use Topics  . Smoking status: Never Smoker   . Smokeless tobacco: Never Used  . Alcohol Use: No   OB History    No data available     Review of Systems  All other systems reviewed and are negative.     Allergies  Ampicillin; Cefprozil; and Meloxicam  Home Medications   Prior to Admission medications   Medication Sig Start Date End Date Taking? Authorizing Provider  citalopram (CELEXA) 40 MG tablet Take 40 mg by mouth daily.   Yes Historical Provider, MD  esomeprazole (NEXIUM) 40 MG capsule Take 40 mg by mouth daily before breakfast.   Yes Historical Provider, MD  fenofibrate 54 MG tablet Take 54 mg by mouth daily. Unsure of dosage   Yes Historical Provider, MD  insulin aspart (NOVOLOG) 100 UNIT/ML injection Inject 12 Units into the skin 3 (three)  times daily before meals.   Yes Historical Provider, MD  insulin glargine (LANTUS) 100 UNIT/ML injection Inject 65 Units into the skin at bedtime.   Yes Historical Provider, MD  lovastatin (MEVACOR) 10 MG tablet Take 10 mg by mouth at bedtime.   Yes Historical Provider, MD  predniSONE (DELTASONE) 20 MG tablet Take 1 tablet (20 mg total) by mouth 2 (two) times daily. Take with food. 04/18/13  Yes Kandra Nicolas, MD  trandolapril-verapamil (TARKA) 2-240 MG per tablet Take 1 tablet by mouth daily.   Yes Historical Provider, MD  triamterene-hydrochlorothiazide (MAXZIDE) 75-50 MG per tablet Take 1 tablet by mouth daily.   Yes Historical Provider, MD  fluconazole (DIFLUCAN) 200 MG tablet Take 1 tablet (200 mg total) by mouth daily. 12/18/13 12/25/13  Hollace Kinnier Sofia, PA-C   BP 110/64 mmHg  Pulse 72  Temp(Src) 97.9 F (36.6 C) (Oral)  Wt 159 lb (72.122 kg)  SpO2 96% Physical Exam  Constitutional: She is oriented to person, place, and time. She appears well-developed.  HENT:  Head: Normocephalic and atraumatic.  Erythema posterior pharynx.  Small amount of white exudate  Eyes: Conjunctivae are normal. Pupils are equal, round, and reactive to light.  Neck: Normal range of motion. Neck supple.  Cardiovascular: Normal rate.   Pulmonary/Chest: Effort normal.  Abdominal: Soft.  Musculoskeletal: Normal range of motion.  Neurological: She is alert  and oriented to person, place, and time. She has normal reflexes.  Skin: Skin is warm.  Psychiatric: She has a normal mood and affect.    ED Course  Procedures (including critical care time) Labs Review Labs Reviewed  POCT RAPID STREP A (OFFICE) - Normal  STREP A DNA PROBE    Imaging Review No results found.   EKG Interpretation None      MDM   Strep negative   Final diagnoses:  Thrush    Diflucan  Return if any problems. AVS   Fransico Meadow, PA-C 12/19/13 1435

## 2013-12-20 LAB — STREP A DNA PROBE: GASP: NEGATIVE

## 2013-12-21 ENCOUNTER — Telehealth: Payer: Self-pay | Admitting: *Deleted

## 2013-12-23 ENCOUNTER — Telehealth: Payer: Self-pay | Admitting: *Deleted

## 2013-12-26 ENCOUNTER — Emergency Department (INDEPENDENT_AMBULATORY_CARE_PROVIDER_SITE_OTHER)
Admission: EM | Admit: 2013-12-26 | Discharge: 2013-12-26 | Disposition: A | Discharge status: Home / Self Care | Payer: 59 | Source: Home / Self Care | Attending: Family Medicine | Admitting: Family Medicine

## 2013-12-26 DIAGNOSIS — Z8619 Personal history of other infectious and parasitic diseases: Secondary | ICD-10-CM

## 2013-12-26 DIAGNOSIS — N760 Acute vaginitis: Secondary | ICD-10-CM

## 2013-12-26 MED ORDER — TERCONAZOLE 0.8 % VA CREA: 1.0000 | TOPICAL_CREAM | Freq: Every day | VAGINAL | Status: DC

## 2013-12-26 MED ORDER — CLOTRIMAZOLE 10 MG MT TROC: 10.0000 mg | Freq: Every day | OROMUCOSAL | Status: DC

## 2013-12-26 NOTE — Discharge Instructions (Signed)

## 2013-12-26 NOTE — ED Notes (Addendum)
Patient c/o yeast in mouth and vaginally,burning sensation down her esophagus, she was seen and treated last week and still is not better, she read the literature given to her at discharge last visit and thinks now that the yeast may be caused by her medications

## 2013-12-26 NOTE — ED Provider Notes (Signed)
CSN: [id number]     Arrival date & time 12/26/13  1256 History   First MD Initiated Contact with Patient 12/26/13 1529     Chief Complaint  Patient presents with  . Vaginitis      HPI Comments: Patient was treated for oral candidiasis and vaginitis one week ago Diflucan for one week.  Her symptoms improved, but she still has developed mild recurrent burning sensation when she swallows, and feels that she is beginning to have some recurrent vaginal itching.  No pelvic or abdominal pain.  She feels well otherwise. She notes that her Hgb A1C was last 6.3  Patient is a 53 y.o. female presenting with vaginal discharge. The history is provided by the patient and the spouse.  Vaginal Discharge Quality:  Unable to specify Severity:  Mild Onset quality:  Gradual Duration:  2 days Timing:  Intermittent Progression:  Partially resolved Chronicity:  Recurrent Relieved by: Diflucan. Worsened by:  Nothing tried Associated symptoms: vaginal itching   Associated symptoms: no abdominal pain, no dysuria, no fever, no genital lesions, no nausea, no rash, no urinary frequency, no urinary hesitancy, no urinary incontinence and no vomiting   Risk factors comment:  Diabetes   Past Medical History  Diagnosis Date  . Hypertension   . Diabetes mellitus   . Hyperlipidemia   . Gastro-esophageal reflux   . Depression   . Anxiety    Past Surgical History  Procedure Laterality Date  . Pip joint fusion      pinky  . Abdominal hysterectomy     Family History  Problem Relation Age of Onset  . Diabetes Mother   . Dementia Mother   . Heart attack Father    History  Substance Use Topics  . Smoking status: Never Smoker   . Smokeless tobacco: Never Used  . Alcohol Use: No   OB History    No data available     Review of Systems  Constitutional: Negative for fever.  Gastrointestinal: Negative for nausea, vomiting and abdominal pain.  Genitourinary: Positive for vaginal discharge. Negative for  bladder incontinence, dysuria and hesitancy.    Allergies  Ampicillin; Cefprozil; and Meloxicam  Home Medications   Prior to Admission medications   Medication Sig Start Date End Date Taking? Authorizing Provider  citalopram (CELEXA) 40 MG tablet Take 40 mg by mouth daily.   Yes Historical Provider, MD  esomeprazole (NEXIUM) 40 MG capsule Take 40 mg by mouth daily before breakfast.   Yes Historical Provider, MD  fenofibrate 54 MG tablet Take 54 mg by mouth daily. Unsure of dosage   Yes Historical Provider, MD  insulin aspart (NOVOLOG) 100 UNIT/ML injection Inject 12 Units into the skin 3 (three) times daily before meals.   Yes Historical Provider, MD  insulin glargine (LANTUS) 100 UNIT/ML injection Inject 65 Units into the skin at bedtime.   Yes Historical Provider, MD  lovastatin (MEVACOR) 10 MG tablet Take 10 mg by mouth at bedtime.   Yes Historical Provider, MD  predniSONE (DELTASONE) 20 MG tablet Take 1 tablet (20 mg total) by mouth 2 (two) times daily. Take with food. 04/18/13  Yes Kandra Nicolas, MD  trandolapril-verapamil (TARKA) 2-240 MG per tablet Take 1 tablet by mouth daily.   Yes Historical Provider, MD  triamterene-hydrochlorothiazide (MAXZIDE) 75-50 MG per tablet Take 1 tablet by mouth daily.   Yes Historical Provider, MD  clotrimazole (MYCELEX) 10 MG troche Take 1 tablet (10 mg total) by mouth 5 (five) times daily. Dissolve in  mouth 12/26/13   Kandra Nicolas, MD  terconazole (TERAZOL 3) 0.8 % vaginal cream Place 1 applicator vaginally at bedtime. 12/26/13   Kandra Nicolas, MD   BP 123/81 mmHg  Pulse 86  Temp(Src) 98.2 F (36.8 C) (Oral)  Wt 154 lb (69.854 kg)  SpO2 95% Physical Exam Nursing notes and Vital Signs reviewed. Appearance:  Patient appears healthy, stated age, and in no acute distress Eyes:  Pupils are equal, round, and reactive to light and accomodation.  Extraocular movement is intact.  Conjunctivae are not inflamed  Ears:  Canals normal.  Tympanic  membranes normal.  Nose:  Normal turbinates.  No sinus tenderness.   Mouth/Pharynx:  Normal.  No plaques, lesions, or ulceration.  No tenderness noted. Neck:  Supple.  No adenopathy Lungs:  Clear to auscultation.  Breath sounds are equal.  Heart:  Regular rate and rhythm without murmurs, rubs, or gallops.  Abdomen:  Nontender without masses or hepatosplenomegaly.  Bowel sounds are present.  No CVA or flank tenderness.  Extremities:  No edema.  No calf tenderness Skin:  No rash present.   ED Course  Procedures none    Labs Reviewed - POCT KOH prep mouth/pharynx/tongue:  No fungal elements noted.       MDM   1. Vaginitis and vulvovaginitis; recent history of candidiasis   2. History of oral candidiasis    Although oral KOH prep negative, will begin empiric Mycelex Troches, and empiric Terazol vaginal cream. Followup with Family Doctor if not improved in one week.     Kandra Nicolas, MD 12/29/13 [phone]

## 2013-12-29 DIAGNOSIS — M5442 Lumbago with sciatica, left side: Secondary | ICD-10-CM

## 2013-12-29 HISTORY — DX: Lumbago with sciatica, left side: M54.42

## 2015-03-14 DIAGNOSIS — F5101 Primary insomnia: Secondary | ICD-10-CM

## 2015-03-14 DIAGNOSIS — F411 Generalized anxiety disorder: Secondary | ICD-10-CM

## 2015-03-14 HISTORY — DX: Primary insomnia: F51.01

## 2015-03-14 HISTORY — DX: Generalized anxiety disorder: F41.1

## 2015-05-04 DIAGNOSIS — R0789 Other chest pain: Secondary | ICD-10-CM

## 2015-05-04 DIAGNOSIS — R072 Precordial pain: Secondary | ICD-10-CM | POA: Insufficient documentation

## 2015-05-04 HISTORY — DX: Precordial pain: R07.2

## 2015-05-04 HISTORY — DX: Other chest pain: R07.89

## 2015-05-10 DIAGNOSIS — I25119 Atherosclerotic heart disease of native coronary artery with unspecified angina pectoris: Secondary | ICD-10-CM

## 2015-05-10 HISTORY — DX: Atherosclerotic heart disease of native coronary artery with unspecified angina pectoris: I25.119

## 2015-07-14 DIAGNOSIS — F324 Major depressive disorder, single episode, in partial remission: Secondary | ICD-10-CM

## 2015-07-14 DIAGNOSIS — G2581 Restless legs syndrome: Secondary | ICD-10-CM | POA: Insufficient documentation

## 2015-07-14 HISTORY — DX: Restless legs syndrome: G25.81

## 2015-07-14 HISTORY — DX: Major depressive disorder, single episode, in partial remission: F32.4

## 2016-03-19 DIAGNOSIS — Z794 Long term (current) use of insulin: Secondary | ICD-10-CM | POA: Diagnosis not present

## 2016-03-19 DIAGNOSIS — F3341 Major depressive disorder, recurrent, in partial remission: Secondary | ICD-10-CM | POA: Diagnosis not present

## 2016-03-19 DIAGNOSIS — E1140 Type 2 diabetes mellitus with diabetic neuropathy, unspecified: Secondary | ICD-10-CM | POA: Diagnosis not present

## 2016-03-19 DIAGNOSIS — I10 Essential (primary) hypertension: Secondary | ICD-10-CM | POA: Diagnosis not present

## 2016-05-07 DIAGNOSIS — E119 Type 2 diabetes mellitus without complications: Secondary | ICD-10-CM | POA: Diagnosis not present

## 2016-07-29 DIAGNOSIS — M546 Pain in thoracic spine: Secondary | ICD-10-CM | POA: Diagnosis not present

## 2016-07-29 DIAGNOSIS — M542 Cervicalgia: Secondary | ICD-10-CM | POA: Diagnosis not present

## 2016-08-26 DIAGNOSIS — M25562 Pain in left knee: Secondary | ICD-10-CM | POA: Diagnosis not present

## 2016-09-25 DIAGNOSIS — E1140 Type 2 diabetes mellitus with diabetic neuropathy, unspecified: Secondary | ICD-10-CM | POA: Diagnosis not present

## 2016-09-25 DIAGNOSIS — I10 Essential (primary) hypertension: Secondary | ICD-10-CM | POA: Diagnosis not present

## 2016-09-25 DIAGNOSIS — I25119 Atherosclerotic heart disease of native coronary artery with unspecified angina pectoris: Secondary | ICD-10-CM | POA: Diagnosis not present

## 2016-09-25 DIAGNOSIS — Z0000 Encounter for general adult medical examination without abnormal findings: Secondary | ICD-10-CM | POA: Diagnosis not present

## 2016-09-25 DIAGNOSIS — F3341 Major depressive disorder, recurrent, in partial remission: Secondary | ICD-10-CM | POA: Diagnosis not present

## 2016-09-25 DIAGNOSIS — Z794 Long term (current) use of insulin: Secondary | ICD-10-CM | POA: Diagnosis not present

## 2016-10-09 DIAGNOSIS — M79644 Pain in right finger(s): Secondary | ICD-10-CM | POA: Diagnosis not present

## 2016-10-09 DIAGNOSIS — M25562 Pain in left knee: Secondary | ICD-10-CM | POA: Diagnosis not present

## 2016-10-09 DIAGNOSIS — S83242A Other tear of medial meniscus, current injury, left knee, initial encounter: Secondary | ICD-10-CM | POA: Diagnosis not present

## 2016-10-09 DIAGNOSIS — M1811 Unilateral primary osteoarthritis of first carpometacarpal joint, right hand: Secondary | ICD-10-CM | POA: Diagnosis not present

## 2016-11-13 DIAGNOSIS — M79641 Pain in right hand: Secondary | ICD-10-CM | POA: Diagnosis not present

## 2016-11-13 DIAGNOSIS — R9431 Abnormal electrocardiogram [ECG] [EKG]: Secondary | ICD-10-CM | POA: Diagnosis not present

## 2016-11-13 DIAGNOSIS — M25531 Pain in right wrist: Secondary | ICD-10-CM | POA: Diagnosis not present

## 2016-11-13 DIAGNOSIS — Z01818 Encounter for other preprocedural examination: Secondary | ICD-10-CM | POA: Diagnosis not present

## 2016-11-28 DIAGNOSIS — Z9889 Other specified postprocedural states: Secondary | ICD-10-CM | POA: Diagnosis not present

## 2016-11-28 DIAGNOSIS — M19041 Primary osteoarthritis, right hand: Secondary | ICD-10-CM | POA: Diagnosis not present

## 2016-11-28 DIAGNOSIS — M1811 Unilateral primary osteoarthritis of first carpometacarpal joint, right hand: Secondary | ICD-10-CM | POA: Diagnosis not present

## 2016-12-20 DIAGNOSIS — M19049 Primary osteoarthritis, unspecified hand: Secondary | ICD-10-CM | POA: Diagnosis not present

## 2016-12-20 DIAGNOSIS — Z4789 Encounter for other orthopedic aftercare: Secondary | ICD-10-CM | POA: Diagnosis not present

## 2017-01-10 ENCOUNTER — Other Ambulatory Visit: Payer: Self-pay

## 2017-01-10 ENCOUNTER — Emergency Department (INDEPENDENT_AMBULATORY_CARE_PROVIDER_SITE_OTHER): Payer: BLUE CROSS/BLUE SHIELD

## 2017-01-10 ENCOUNTER — Emergency Department (INDEPENDENT_AMBULATORY_CARE_PROVIDER_SITE_OTHER)
Admission: EM | Admit: 2017-01-10 | Discharge: 2017-01-10 | Disposition: A | Discharge status: Home / Self Care | Payer: BLUE CROSS/BLUE SHIELD | Source: Home / Self Care | Attending: Family Medicine | Admitting: Family Medicine

## 2017-01-10 ENCOUNTER — Encounter: Payer: Self-pay | Admitting: Emergency Medicine

## 2017-01-10 DIAGNOSIS — M542 Cervicalgia: Secondary | ICD-10-CM | POA: Diagnosis not present

## 2017-01-10 DIAGNOSIS — S161XXA Strain of muscle, fascia and tendon at neck level, initial encounter: Secondary | ICD-10-CM

## 2017-01-10 MED ORDER — PREDNISONE 20 MG PO TABS: ORAL_TABLET | ORAL | 0 refills | Status: DC

## 2017-01-10 MED ORDER — HYDROCODONE-ACETAMINOPHEN 5-325 MG PO TABS: 1.0000 | ORAL_TABLET | Freq: Four times a day (QID) | ORAL | 0 refills | Status: DC | PRN

## 2017-01-10 MED ORDER — CYCLOBENZAPRINE HCL 10 MG PO TABS: ORAL_TABLET | ORAL | 0 refills | Status: DC
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
## 2017-01-10 NOTE — Discharge Instructions (Addendum)
Wear cervical collar.  Apply ice pack for 20 to 30 minutes, 3 to 4 times daily  Continue until pain and swelling decrease.  Begin range of motion and stretching exercises as tolerated.
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
## 2017-01-11 ENCOUNTER — Telehealth: Payer: Self-pay | Admitting: Emergency Medicine

## 2017-01-11 NOTE — Telephone Encounter (Signed)
Courtesy call to patient. States she is feeling better.  advised to CB with questions or concerns.

## 2017-03-27 DIAGNOSIS — Z794 Long term (current) use of insulin: Secondary | ICD-10-CM | POA: Diagnosis not present

## 2017-03-27 DIAGNOSIS — F3341 Major depressive disorder, recurrent, in partial remission: Secondary | ICD-10-CM | POA: Diagnosis not present

## 2017-03-27 DIAGNOSIS — E1140 Type 2 diabetes mellitus with diabetic neuropathy, unspecified: Secondary | ICD-10-CM | POA: Diagnosis not present

## 2017-03-27 DIAGNOSIS — E785 Hyperlipidemia, unspecified: Secondary | ICD-10-CM | POA: Diagnosis not present

## 2017-03-27 DIAGNOSIS — I10 Essential (primary) hypertension: Secondary | ICD-10-CM | POA: Diagnosis not present

## 2017-03-27 DIAGNOSIS — I25119 Atherosclerotic heart disease of native coronary artery with unspecified angina pectoris: Secondary | ICD-10-CM | POA: Diagnosis not present

## 2017-04-28 DIAGNOSIS — G5603 Carpal tunnel syndrome, bilateral upper limbs: Secondary | ICD-10-CM | POA: Diagnosis not present

## 2017-04-28 DIAGNOSIS — Z96691 Finger-joint replacement of right hand: Secondary | ICD-10-CM | POA: Diagnosis not present

## 2017-04-28 DIAGNOSIS — M1811 Unilateral primary osteoarthritis of first carpometacarpal joint, right hand: Secondary | ICD-10-CM | POA: Diagnosis not present

## 2017-04-28 DIAGNOSIS — M79644 Pain in right finger(s): Secondary | ICD-10-CM | POA: Diagnosis not present

## 2017-05-23 DIAGNOSIS — E1140 Type 2 diabetes mellitus with diabetic neuropathy, unspecified: Secondary | ICD-10-CM | POA: Diagnosis not present

## 2017-05-23 DIAGNOSIS — Z794 Long term (current) use of insulin: Secondary | ICD-10-CM | POA: Diagnosis not present

## 2017-05-23 DIAGNOSIS — G5603 Carpal tunnel syndrome, bilateral upper limbs: Secondary | ICD-10-CM | POA: Diagnosis not present

## 2017-05-23 HISTORY — DX: Carpal tunnel syndrome, bilateral upper limbs: G56.03

## 2017-06-23 ENCOUNTER — Encounter: Payer: Self-pay | Admitting: Emergency Medicine

## 2017-06-23 ENCOUNTER — Emergency Department (INDEPENDENT_AMBULATORY_CARE_PROVIDER_SITE_OTHER)
Admission: EM | Admit: 2017-06-23 | Discharge: 2017-06-23 | Disposition: A | Discharge status: Home / Self Care | Payer: BLUE CROSS/BLUE SHIELD | Source: Home / Self Care | Attending: Family Medicine | Admitting: Family Medicine

## 2017-06-23 ENCOUNTER — Other Ambulatory Visit: Payer: Self-pay

## 2017-06-23 DIAGNOSIS — M720 Palmar fascial fibromatosis [Dupuytren]: Secondary | ICD-10-CM

## 2017-06-23 NOTE — ED Triage Notes (Signed)
Patient had right carpal tunnel surgery in October, 2018 and has had problems with pain in hand since then. Over past one month has developed knots along palm crease and excruciating pain at base of thumb.

## 2017-06-23 NOTE — ED Provider Notes (Signed)
[name]    CSN: [id number] Arrival date & time: 06/23/17  0807     History   Chief Complaint Chief Complaint  Patient presents with  . Hand Problem    right    HPI Tonya Chandler is a 57 y.o. female.   Patient complains of approximately one month history of gradual development of painful "knots" in the palmar surface of her right hand.  She has developed a particularly painful small nodule at the base of right thumb (palmar surface).  She has discomfort when extending all of her fingers. In October 2018 she underwent right [facility] interposition arthroplasty utilizing flexor carpi radialis longus, bio-tenodesis screw fixation and graft jacket right thumb in [place].  She recovered reasonably well from that surgery, although she feels discomfort in her right volar forearm.    The history is provided by the patient and the spouse.  Hand Pain  This is a new problem. Episode onset: 1 month ago. The problem occurs constantly. The problem has been gradually worsening. Exacerbated by: flexion and extension of fingers. Nothing relieves the symptoms. Treatments tried: aspirin. The treatment provided mild relief.    Past Medical History:  Diagnosis Date  . Anxiety   . Depression   . Diabetes mellitus   . Gastro-esophageal reflux   . Hyperlipidemia   . Hypertension     Patient Active Problem List   Diagnosis Date Noted  . DIABETES MELLITUS, TYPE II 02/16/2010  . HYPERLIPIDEMIA 02/16/2010  . DEPRESSION 02/16/2010  . HYPERTENSION 02/16/2010  . GERD 02/16/2010  . KNEE PAIN, LEFT, ACUTE 02/16/2010    Past Surgical History:  Procedure Laterality Date  . ABDOMINAL HYSTERECTOMY    . CARPAL TUNNEL RELEASE Right 10/2016  . PIP JOINT FUSION     pinky    OB History   None      Home Medications    Prior to Admission medications   Medication Sig Start Date End Date Taking? Authorizing Provider  Dulaglutide (TRULICITY) 2.87 OM/7.6HM SOPN Inject into the skin once a  week.   Yes [provider]  DULoxetine (CYMBALTA) 20 MG capsule Take 20 mg by mouth daily.   Yes [provider]  estradiol (CLIMARA - DOSED IN MG/24 HR) 0.1 mg/24hr patch Place 0.1 mg onto the skin once a week.   Yes [provider]  rOPINIRole (REQUIP) 3 MG tablet Take 3 mg by mouth at bedtime.   Yes [provider]  citalopram (CELEXA) 40 MG tablet Take 40 mg by mouth daily.    [provider]  clotrimazole (MYCELEX) 10 MG troche Take 1 tablet (10 mg total) by mouth 5 (five) times daily. Dissolve in mouth 12/26/13   Kandra Nicolas, MD  cyclobenzaprine (FLEXERIL) 10 MG tablet Take one tab by mouth 2 or 3 times daily for muscle spasm. 01/10/17   Kandra Nicolas, MD  esomeprazole (NEXIUM) 40 MG capsule Take 40 mg by mouth daily before breakfast.    [provider]  fenofibrate 54 MG tablet Take 54 mg by mouth daily. Unsure of dosage    [provider]  HYDROcodone-acetaminophen (NORCO/VICODIN) 5-325 MG tablet Take 1 tablet by mouth every 6 (six) hours as needed for moderate pain. 01/10/17   Kandra Nicolas, MD  insulin aspart (NOVOLOG) 100 UNIT/ML injection Inject 12 Units into the skin 3 (three) times daily before meals.    [provider]  insulin glargine (LANTUS) 100 UNIT/ML injection Inject 55 Units into the skin  at bedtime.     [provider]  lovastatin (MEVACOR) 10 MG tablet Take 10 mg by mouth at bedtime.    [provider]  predniSONE (DELTASONE) 20 MG tablet Take one tab by mouth twice daily for 3 days, then one daily for 2 days. Take with food. 01/10/17   Kandra Nicolas, MD  terconazole (TERAZOL 3) 0.8 % vaginal cream Place 1 applicator vaginally at bedtime. 12/26/13   Kandra Nicolas, MD  trandolapril-verapamil (TARKA) 2-240 MG per tablet Take 1 tablet by mouth daily.    [provider]  triamterene-hydrochlorothiazide (MAXZIDE) 75-50 MG per tablet Take 1 tablet by mouth daily.     [provider]    Family History Family History  Problem Relation Age of Onset  . Diabetes Mother   . Dementia Mother   . Heart attack Father     Social History Social History   Tobacco Use  . Smoking status: Never Smoker  . Smokeless tobacco: Never Used  Substance Use Topics  . Alcohol use: No  . Drug use: No     Allergies   Ampicillin; Cefprozil; and Meloxicam   Review of Systems Review of Systems  All other systems reviewed and are negative.    Physical Exam Triage Vital Signs ED Triage Vitals  Enc Vitals Group     BP 06/23/17 0829 (!) 141/94     Pulse Rate 06/23/17 0829 83     Resp 06/23/17 0829 16     Temp 06/23/17 0829 98.2 F (36.8 C)     Temp Source 06/23/17 0829 Oral     SpO2 06/23/17 0829 95 %     Weight 06/23/17 0830 153 lb (69.4 kg)     Height 06/23/17 0830 5' (1.524 m)     Head Circumference --      Peak Flow --      Pain Score 06/23/17 0829 4     Pain Loc --      Pain Edu? --      Excl. in [place]? --    No data found.  Updated Vital Signs BP (!) 141/94 (BP Location: Left Arm)   Pulse 83   Temp 98.2 F (36.8 C) (Oral)   Resp 16   Ht 5' (1.524 m)   Wt 153 lb (69.4 kg)   SpO2 95%   BMI 29.88 kg/m   Visual Acuity Right Eye Distance:   Left Eye Distance:   Bilateral Distance:    Right Eye Near:   Left Eye Near:    Bilateral Near:     Physical Exam  Constitutional: She appears well-developed and well-nourished. No distress.  HENT:  Head: Normocephalic.  Eyes: Pupils are equal, round, and reactive to light.  Pulmonary/Chest: Effort normal.  Musculoskeletal:       Arms:      Right hand: She exhibits tenderness. She exhibits normal range of motion, no bony tenderness, normal two-point discrimination, normal capillary refill, no deformity and no swelling. Normal sensation noted.       Hands: Well healed surgical scar right volar forearm; no tenderness to palpation.  Right palm has several palpable subcutaneous nodules  c/w dupuytren's contracture.  There is a small nodule about 30mm diameter at base of thumb that is particularly tender to palpation.   No tenderness of thumb extensor tendons.  Neurological: She is alert.  Skin: Skin is warm and dry.  Nursing note and vitals reviewed.    UC Treatments / Results  Labs (  all labs ordered are listed, but only abnormal results are displayed) Labs Reviewed - No data to display  EKG None  Radiology No results found.  Procedures Procedures (including critical care time)  Medications Ordered in UC Medications - No data to display  Initial Impression / Assessment and Plan / UC Course  I have reviewed the triage vital signs and the nursing notes.  Pertinent labs & imaging results that were available during my care of the patient were reviewed by me and considered in my medical decision making (see chart for details).    Will refer to Sports Medicine for further evaluation/treatment.   Final Clinical Impressions(s) / UC Diagnoses   Final diagnoses:  Dupuytren's contracture of right hand     Discharge Instructions     May take ibuprofen as needed.    ED Prescriptions    None         Kandra Nicolas, MD 06/23/17 [phone]

## 2017-06-23 NOTE — Discharge Instructions (Signed)
May take ibuprofen as needed. 

## 2017-06-24 ENCOUNTER — Ambulatory Visit (INDEPENDENT_AMBULATORY_CARE_PROVIDER_SITE_OTHER): Payer: BLUE CROSS/BLUE SHIELD | Admitting: Sports Medicine

## 2017-06-24 ENCOUNTER — Ambulatory Visit (INDEPENDENT_AMBULATORY_CARE_PROVIDER_SITE_OTHER): Payer: BLUE CROSS/BLUE SHIELD

## 2017-06-24 DIAGNOSIS — M7541 Impingement syndrome of right shoulder: Secondary | ICD-10-CM

## 2017-06-24 DIAGNOSIS — M25511 Pain in right shoulder: Secondary | ICD-10-CM | POA: Diagnosis not present

## 2017-06-24 DIAGNOSIS — M79641 Pain in right hand: Secondary | ICD-10-CM

## 2017-06-24 DIAGNOSIS — M720 Palmar fascial fibromatosis [Dupuytren]: Secondary | ICD-10-CM | POA: Diagnosis not present

## 2017-06-24 HISTORY — DX: Palmar fascial fibromatosis (dupuytren): M72.0

## 2017-06-24 HISTORY — DX: Impingement syndrome of right shoulder: M75.41

## 2017-06-24 HISTORY — DX: Pain in right hand: M79.641

## 2017-06-24 NOTE — Assessment & Plan Note (Signed)
We are going to watch this for now, I do not think its the Dupuytren's contracture that is giving her pain.

## 2017-06-24 NOTE — Progress Notes (Signed)
Subjective:    I'm seeing this patient as a consultation for:  Drake Leach, MD  CC:  Right hand pain and right shoulder pain  HPI: Tonya Chandler, a 57yo female with a pmh significant for bilateral cmc 1st digit oa-8 months s/p right 1st cmc arthroplasty -is presenting in clinic today with a cc of right hand pain on the palmer surface as well as the 1st extension compartment of the wrist.  Pt reports that the pain on the palmer side of her hand began about 2-3  months ago and has progressively worsened- persistent and moderate (5/10 on the pain scale). Pt reports that nsaids have brought inadequate relief.  Pt reports that pain is worse with hand extension. Pt reports that pain is accompanied by progressive weakness. Pt denies mechanical symptoms of locking or catching.   Pt also reports progressively worsening pain of the right shoulder, moderate and persistent.   I reviewed the past medical history, family history, social history, surgical history, and allergies today and no changes were needed.  Please see the problem list section below in epic for further details.  Past Medical History: Past Medical History:  Diagnosis Date  . Anxiety   . Depression   . Diabetes mellitus   . Gastro-esophageal reflux   . Hyperlipidemia   . Hypertension    Past Surgical History: Past Surgical History:  Procedure Laterality Date  . ABDOMINAL HYSTERECTOMY    . CARPAL TUNNEL RELEASE Right 10/2016  . PIP JOINT FUSION     pinky   Social History: Social History   Socioeconomic History  . Marital status: Married    Spouse name: Not on file  . Number of children: Not on file  . Years of education: Not on file  . Highest education level: Not on file  Occupational History  . Not on file  Social Needs  . Financial resource strain: Not on file  . Food insecurity:    Worry: Not on file    Inability: Not on file  . Transportation needs:    Medical: Not on file    Non-medical: Not on file    Tobacco Use  . Smoking status: Never Smoker  . Smokeless tobacco: Never Used  Substance and Sexual Activity  . Alcohol use: No  . Drug use: No  . Sexual activity: Yes  Lifestyle  . Physical activity:    Days per week: Not on file    Minutes per session: Not on file  . Stress: Not on file  Relationships  . Social connections:    Talks on phone: Not on file    Gets together: Not on file    Attends religious service: Not on file    Active member of club or organization: Not on file    Attends meetings of clubs or organizations: Not on file    Relationship status: Not on file  Other Topics Concern  . Not on file  Social History Narrative  . Not on file   Family History: Family History  Problem Relation Age of Onset  . Diabetes Mother   . Dementia Mother   . Heart attack Father    Allergies: Allergies  Allergen Reactions  . Ampicillin   . Cefprozil   . Meloxicam    Medications: See med rec.  Review of Systems: No headache, visual changes, nausea, vomiting, diarrhea, constipation, dizziness, abdominal pain, skin rash, fevers, chills, night sweats, weight loss, swollen lymph nodes, body aches, joint swelling, muscle aches, chest  pain, shortness of breath, mood changes, visual or auditory hallucinations.   Objective:   General: Well Developed, well nourished, and in no acute distress.  Neuro:  Extra-ocular muscles intact, able to move all 4 extremities, sensation grossly intact.  Deep tendon reflexes tested were normal. Psych: Alert and oriented, mood congruent with affect. ENT:  Ears and nose appear unremarkable.  Hearing grossly normal. Neck: Unremarkable overall appearance, trachea midline.  No visible thyroid enlargement. Eyes: Conjunctivae and lids appear unremarkable.  Pupils equal and round. Skin: Warm and dry, no rashes noted.  Cardiovascular: Pulses palpable, no extremity edema.  Right Wrist: Inspection normal with no visible erythema but some visible swelling  over 1st extensor compartment of the wrist. ROM smooth and normal with good flexion and extension and ulnar/radial deviation that is symmetrical with opposite wrist. Palpation is normal over metacarpals, navicular, lunate, and TFCC; without tenderness/swelling.   Nodular mass felt at the midpalmer space over flexor tendons of the hand; tender to palpation 4th >> 3rd Snuffbox tenderness. No tenderness over Canal of [place]. Strength 5/5 in all directions without pain. Negative Finklestein, with baseline tenderness felt over the snuffbox  Negative Watson's test. Palpable digital nerve on 1st digit that is tender to palpation   Right Shoulder: Inspection reveals no abnormalities, atrophy or asymmetry. Palpation is normal with no tenderness over AC joint or bicipital groove. ROM is full in all planes. Rotator cuff strength normal throughout. Signs of impingement with positive Neer and Hawkin's tests,  Negative empty can sign. Speeds and Yergason's tests normal. No labral pathology noted with negative Obrien's, negative clunk and good stability. Normal scapular function observed. No apprehension sign   Impression and Recommendations:   This case required medical decision making of moderate complexity.  Assessment-Dupuytren's Contracture of 3rd and 4th Digit of Right Hand Injected fourth flexor tendon sheath with local short term anesthetic (1/2 ml lidocaine) and corticosteroid  (1/2 ml kenalog) to provide immediate relief.  Pt was asked to follow up for evaluation of symptoms.   Since primary pain generator is  mostly likely the duputren contracture, located midpalm near the 4th flexor tendon, and not over the palpable digital nerve , pt was counseled about next steps including but not limited to nerve block and/or hydrodissection of the palpable digital nerve should right hand pain persist.    Assessment-Impingement Syndrome of Right Shoulder Pt was referred to complete formal physical  therapy to improve range of motion and strength long term.   Pt was asked to complete imaging (Xray) today to more fully evaluate the joint and establish a baseline.  Pt was counseled about next steps which include but are not limited to injection into the RTC and further imaging (MRI) should symptoms not alleviate. Pt was asked to follow up in one month   ___________________________________________ Gwen Her. Dianah Field, M.D., ABFM., CAQSM. Primary Care and [place] [employer] [facility]

## 2017-06-24 NOTE — Assessment & Plan Note (Signed)
I think most of her pain is coming from her fourth flexor tendon sheath, injection today. She does have a palpable digital nerve on the volar aspect of her right thumb that seems to be compressed. We will try a nerve block/hydrodissection at the next visit if the thumb pain is not better.

## 2017-06-24 NOTE — Assessment & Plan Note (Addendum)
X-rays, formal physical therapy. Return in 1 month, injection if no better.

## 2017-07-14 ENCOUNTER — Ambulatory Visit: Payer: BLUE CROSS/BLUE SHIELD | Admitting: Rehabilitative and Restorative Service Providers"

## 2017-07-14 ENCOUNTER — Encounter: Payer: Self-pay | Admitting: Rehabilitative and Restorative Service Providers"

## 2017-07-14 DIAGNOSIS — R29898 Other symptoms and signs involving the musculoskeletal system: Secondary | ICD-10-CM | POA: Diagnosis not present

## 2017-07-14 DIAGNOSIS — M25512 Pain in left shoulder: Secondary | ICD-10-CM

## 2017-07-14 DIAGNOSIS — R293 Abnormal posture: Secondary | ICD-10-CM | POA: Diagnosis not present

## 2017-07-14 DIAGNOSIS — M25511 Pain in right shoulder: Secondary | ICD-10-CM

## 2017-07-14 NOTE — Therapy (Signed)
[place], [name], [address] Phone: [phone]   Fax:  [phone]  Physical Therapy Evaluation  Patient Details  Name: Tonya Chandler MRN: [id number] Date of Birth: 1960-02-08 Referring Provider: Dr Dianah Field   Encounter Date: 07/14/2017  PT End of Session - 07/14/17 0846    Visit Number  1    Number of Visits  12    Date for PT Re-Evaluation  08/25/17    PT Start Time  0844    PT Stop Time  0940    PT Time Calculation (min)  56 min    Activity Tolerance  Patient tolerated treatment well       Past Medical History:  Diagnosis Date  . Anxiety   . Depression   . Diabetes mellitus   . Gastro-esophageal reflux   . Hyperlipidemia   . Hypertension     Past Surgical History:  Procedure Laterality Date  . ABDOMINAL HYSTERECTOMY    . CARPAL TUNNEL RELEASE Right 10/2016  . PIP JOINT FUSION     pinky    There were no vitals filed for this visit.   Subjective Assessment - 07/14/17 0859    Subjective  Patient reports ~ 2 month history of Rt shoulder pain with no known injury. Symptoms have increased in the past serveral weeks. Pain is worst with driving and functional use of Rt UE>     Pertinent History  [facility] surgery 10/18 Rt; 2015 Lt; hysterectomy      Diagnostic tests  xrays    Patient Stated Goals  get rid of shoulder pain     Currently in Pain?  Yes    Pain Score  7     Pain Location  Shoulder    Pain Orientation  Right    Pain Descriptors / Indicators  Dull;Nagging;Constant    Pain Type  Acute pain    Pain Radiating Towards  into the Rt neck area' occasionally into the Rt arm to elbow area     Pain Onset  More than a month ago    Pain Frequency  Constant    Aggravating Factors   driving; lifting; carrying; reaching; lying on Lt/Rt side to sleep    Pain Relieving Factors  TENS; heat         [facility] PT Assessment - 07/14/17 0001      Assessment   Medical Diagnosis  Rt shoulder  pain     Referring Provider  Dr Dianah Field    Onset Date/Surgical Date  04/21/17    Hand Dominance  Left    Next MD Visit  07/22/17    Prior Therapy  none       Precautions   Precautions  None      Balance Screen   Has the patient fallen in the past 6 months  No    Has the patient had a decrease in activity level because of a fear of falling?   No    Is the patient reluctant to leave their home because of a fear of falling?   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       Observation/Other Assessments   Focus on Therapeutic Outcomes (  FOTO)   27% limitation       Sensation   Additional Comments  numbness and tingling Rt hand since surgery       Posture/Postural Control   Posture Comments  head forward; shoulders rounded and elevated; head of the humerus anterior in orientation       AROM   Right/Left Shoulder  -- discomfort end range Rt shd flex/abd/IR; cervical motions     Right Shoulder Extension  45 Degrees    Right Shoulder Flexion  150 Degrees    Right Shoulder ABduction  149 Degrees    Right Shoulder Internal Rotation  35 Degrees    Right Shoulder External Rotation  88 Degrees    Left Shoulder Extension  57 Degrees    Left Shoulder Flexion  151 Degrees    Left Shoulder ABduction  169 Degrees    Left Shoulder Internal Rotation  42 Degrees    Left Shoulder External Rotation  90 Degrees    Cervical Flexion  42    Cervical Extension  36    Cervical - Right Side Bend  35    Cervical - Left Side Bend  26    Cervical - Right Rotation  53    Cervical - Left Rotation  59      Strength   Overall Strength Comments  5/5 bilat shoudler except middle and lower traps Rt - 4+/5 with some discomfort       Palpation   Spinal mobility  hypomibiilty and pain with  CPA and lateral mobs upper thoracic and cervical spine     Palpation comment  muscular tightness Rt > Lt pecs; upper trap; leveator; periscapular musculature; teres                 Objective measurements completed on examination: See above findings.      [facility] Adult PT Treatment/Exercise - 07/14/17 0001      Self-Care   Self-Care  -- myofacial ball release work       Neuro Re-ed    Neuro Re-ed Details   postural correction       Shoulder Exercises: Standing   Other Standing Exercises  axial extension 10 sec x 5; scap squeeze 10 sec x 5; L's x 10; W's x 10 with swim noodle       Shoulder Exercises: Stretch   Other Shoulder Stretches  3 way doorway stretch 30 sec x 2 each       Moist Heat Therapy   Number Minutes Moist Heat  20 Minutes    Moist Heat Location  Cervical;Shoulder Rt      Electrical Stimulation   Electrical Stimulation Location  Rt cervical and shoulder areas     Electrical Stimulation Action  IFC    Electrical Stimulation Parameters  to tolerance    Electrical Stimulation Goals  Pain;Tone             PT Education - 07/14/17 [phone]    Education Details  HEP DN     Person(s) Educated  Patient    Methods  Explanation;Demonstration;Tactile cues;Verbal cues;Handout    Comprehension  Verbalized understanding;Returned demonstration;Verbal cues required;Tactile cues required          PT Long Term Goals - 07/14/17 1717      PT LONG TERM GOAL #1   Title  Improve posture and alignment with patient to demonstrate improve upright postue with posterior shoulder girdle engaged. 08/25/17    Time  6    Period  Weeks    Status  New      PT LONG TERM GOAL #2   Title  Decrease pain Rt upper quarter by 50-75% allowing patient to participate in normal functional activities with less pain and discomfort 08/25/17    Time  6    Period  Weeks    Status  New      PT LONG TERM GOAL #3   Title  Increase cervical and shoulder ROM to painful WNL's 08/25/17    Time  6     Period  Weeks    Status  New      PT LONG TERM GOAL #4   Title  Independent in HEP 08/25/17    Time  6    Period  Weeks    Status  New      PT LONG TERM GOAL #5   Title  Improve to maintain FOTO at current level 08/25/17    Time  [place]    Status  New             Plan - 07/14/17 1712    Clinical Impression Statement  Carinne presents with 2 month history of Rt shoulder pain wth no known injury. She ahs poor posture; limted cervical and shoulder ROM; muscular tightness to palpation; radicular symptoms; Rt hand and UE dysfunction. She will benefit from PT to address problems identified.     History and Personal Factors relevant to plan of care:  chronic Rt UE dysfunction     Clinical Presentation  Stable    Clinical Decision Making  Low    Rehab Potential  Good    PT Frequency  2x / week    PT Duration  6 weeks    PT Treatment/Interventions  Patient/family education;ADLs/Self Care Home Management;Cryotherapy;Electrical Stimulation;Iontophoresis 4mg /ml Dexamethasone;Moist Heat;Ultrasound;Dry needling;Manual techniques;Neuromuscular re-education;Therapeutic activities;Therapeutic exercise    PT Next Visit Plan  check neural tension; progress with supine static snow angel; manual work Rt upper quarter; progress to posterior shoulder girdle strengthening as postural correction allows; modalities    Consulted and Agree with Plan of Care  Patient       Patient will benefit from skilled therapeutic intervention in order to improve the following deficits and impairments:  Postural dysfunction, Improper body mechanics, Pain, Increased fascial restricitons, Increased muscle spasms, Decreased range of motion, Decreased activity tolerance  Visit Diagnosis: Acute pain of left shoulder - Plan: PT plan of care cert/re-cert  Abnormal posture - Plan: PT plan of care cert/re-cert  Other symptoms and signs involving the musculoskeletal system - Plan: PT plan of care  cert/re-cert     Problem List Patient Active Problem List   Diagnosis Date Noted  . Right hand pain 06/24/2017  . Impingement syndrome of shoulder, right 06/24/2017  . Dupuytren's contracture of right hand 06/24/2017  . DIABETES MELLITUS, TYPE II 02/16/2010  . HYPERLIPIDEMIA 02/16/2010  . DEPRESSION 02/16/2010  . HYPERTENSION 02/16/2010  . GERD 02/16/2010  . KNEE PAIN, LEFT, ACUTE 02/16/2010    Derita Michelsen Nilda Simmer PT,MPH  07/14/2017, 5:22 PM  [facility] [place], [name], [address] Phone: [phone]   Fax:  [phone]  Name: Tocara Mennen MRN: [id number] Date of Birth: 1960-11-19

## 2017-07-14 NOTE — Patient Instructions (Signed)
Axial Extension (Chin Tuck)    Pull chin in and lengthen back of neck. Hold __5__ seconds while counting out loud. Repeat __10__ times. Do __several__ sessions per day.  Shoulder Blade Squeeze   Can use swim noodle along spine  Rotate shoulders back, then squeeze shoulder blades down and back. Hold 10 Repeat _10___ times. Do __several __ sessions per day.  Upper Back Strength: Lower Trapezius / Rotator Cuff " L's "     Arms in waitress pose, palms up. Press hands back and slide shoulder blades down. Hold for __5__ seconds. Repeat _10___ times. 1-2 times per day.    Scapular Retraction: Elbow Flexion (Standing)  "W's"     With elbows bent to 90, pinch shoulder blades together and rotate arms out, keeping elbows bent. Repeat __10__ times per set. Do __1-2__ sets per session. Do _several ___ sessions per day.  Scapula Adduction With Pectoralis Stretch: Low - Standing   Shoulders at 45 hands even with shoulders, keeping weight through legs, shift weight forward until you feel pull or stretch through the front of your chest. Hold _30__ seconds. Do _3__ times, _2-4__ times per day.   Scapula Adduction With Pectoralis Stretch: Mid-Range - Standing   Shoulders at 90 elbows even with shoulders, keeping weight through legs, shift weight forward until you feel pull or strength through the front of your chest. Hold __30_ seconds. Do _3__ times, __2-4_ times per day.   Scapula Adduction With Pectoralis Stretch: High - Standing   Shoulders at 120 hands up high on the doorway, keeping weight on feet, shift weight forward until you feel pull or stretch through the front of your chest. Hold _30__ seconds. Do _3__ times, _2-3__ times per day.   Trigger Point Dry Needling  . What is Trigger Point Dry Needling (DN)? o DN is a physical therapy technique used to treat muscle pain and dysfunction. Specifically, DN helps deactivate muscle trigger points (muscle knots).  o A thin  filiform needle is used to penetrate the skin and stimulate the underlying trigger point. The goal is for a local twitch response (LTR) to occur and for the trigger point to relax. No medication of any kind is injected during the procedure.   . What Does Trigger Point Dry Needling Feel Like?  o The procedure feels different for each individual patient. Some patients report that they do not actually feel the needle enter the skin and overall the process is not painful. Very mild bleeding may occur. However, many patients feel a deep cramping in the muscle in which the needle was inserted. This is the local twitch response.   Marland Kitchen How Will I feel after the treatment? o Soreness is normal, and the onset of soreness may not occur for a few hours. Typically this soreness does not last longer than two days.  o Bruising is uncommon, however; ice can be used to decrease any possible bruising.  o In rare cases feeling tired or nauseous after the treatment is normal. In addition, your symptoms may get worse before they get better, this period will typically not last longer than 24 hours.   . What Can I do After My Treatment? o Increase your hydration by drinking more water for the next 24 hours. o You may place ice or heat on the areas treated that have become sore, however, do not use heat on inflamed or bruised areas. Heat often brings more relief post needling. o You can continue your regular activities, but vigorous  activity is not recommended initially after the treatment for 24 hours. o DN is best combined with other physical therapy such as strengthening, stretching, and other therapies.    [facility] [place], [place] [address]  [phone] (office) [phone] (fax)

## 2017-07-17 ENCOUNTER — Ambulatory Visit: Payer: BLUE CROSS/BLUE SHIELD | Admitting: Physical Therapy

## 2017-07-17 DIAGNOSIS — M25511 Pain in right shoulder: Secondary | ICD-10-CM

## 2017-07-17 DIAGNOSIS — R293 Abnormal posture: Secondary | ICD-10-CM | POA: Diagnosis not present

## 2017-07-17 DIAGNOSIS — R29898 Other symptoms and signs involving the musculoskeletal system: Secondary | ICD-10-CM | POA: Diagnosis not present

## 2017-07-17 DIAGNOSIS — M25512 Pain in left shoulder: Secondary | ICD-10-CM | POA: Diagnosis not present

## 2017-07-17 NOTE — Therapy (Signed)
[place], [name], [address] Phone: [phone]   Fax:  [phone]  Physical Therapy Treatment  Patient Details  Name: Tonya Chandler MRN: [id number] Date of Birth: 05/29/1960 Referring Provider: Dr. Dianah Field   Encounter Date: 07/17/2017  PT End of Session - 07/17/17 1618    Visit Number  2    Number of Visits  12    Date for PT Re-Evaluation  08/25/17    PT Start Time  5053    PT Stop Time  1716    PT Time Calculation (min)  61 min    Activity Tolerance  Patient tolerated treatment well    Behavior During Therapy  [facility] for tasks assessed/performed       Past Medical History:  Diagnosis Date  . Anxiety   . Depression   . Diabetes mellitus   . Gastro-esophageal reflux   . Hyperlipidemia   . Hypertension     Past Surgical History:  Procedure Laterality Date  . ABDOMINAL HYSTERECTOMY    . CARPAL TUNNEL RELEASE Right 10/2016  . PIP JOINT FUSION     pinky    There were no vitals filed for this visit.  Subjective Assessment - 07/17/17 1618    Subjective  Pt reports she was so sore after last session.  She was too sore to do exercises. She couldn't find a ball for massage.     Currently in Pain?  Yes    Pain Score  7     Pain Location  Shoulder    Pain Orientation  Right    Pain Radiating Towards  into Rt neck and elbow    Aggravating Factors   driving, lifting, ,carrying, reaching    Pain Relieving Factors  TENS, aspirin, heat.          [facility] PT Assessment - 07/17/17 0001      Assessment   Medical Diagnosis  Rt shoulder pain     Referring Provider  Dr. Dianah Field    Onset Date/Surgical Date  04/21/17    Hand Dominance  Left    Next MD Visit  07/22/17      AROM   Cervical - Right Rotation  32    Cervical - Left Rotation  60        [facility] Adult PT Treatment/Exercise - 07/17/17 0001      Self-Care   Self-Care  Other Self-Care Comments    Other Self-Care Comments   Pt  instructed in self MFR to Rt ant neck.  Pt returned demo with VC, verbalized understanding.       Shoulder Exercises: Supine   Other Supine Exercises  thoracic ext over black bolster for 3-4 breaths, x 3 reps      Shoulder Exercises: Seated   Other Seated Exercises  scap squeeze with L's, W's x 5 sec hold x 10 reps    Other Seated Exercises  chin tucks with axial ext x 5 sec x 10 reps;  thoracic ext over back of chair x 4 reps of 5-10 sec      Shoulder Exercises: Stretch   Other Shoulder Stretches  3 position doorway x 3 reps each position for 20 sec - modified to unilateral for use in HEP when traveling.       Moist Heat Therapy   Number Minutes Moist Heat  15 Minutes    Moist Heat Location  Cervical;Shoulder Rt      Electrical Stimulation   Electrical Stimulation  Location  Rt cervical and shoulder areas     Electrical Stimulation Action  IFC    Electrical Stimulation Parameters   To tolerance    Electrical Stimulation Goals  Pain;Tone      Manual Therapy   Manual Therapy  Soft tissue mobilization    Manual therapy comments  pt supine    Soft tissue mobilization  STM to Rt upper trap, levator, scalenes, rhomboid and thoracic paraspinals.  suboccipital release.                   PT Long Term Goals - 07/14/17 1717      PT LONG TERM GOAL #1   Title  Improve posture and alignment with patient to demonstrate improve upright postue with posterior shoulder girdle engaged. 08/25/17    Time  6    Period  Weeks    Status  New      PT LONG TERM GOAL #2   Title  Decrease pain Rt upper quarter by 50-75% allowing patient to participate in normal functional activities with less pain and discomfort 08/25/17    Time  6    Period  Weeks    Status  New      PT LONG TERM GOAL #3   Title  Increase cervical and shoulder ROM to painful WNL's 08/25/17    Time  6    Period  Weeks    Status  New      PT LONG TERM GOAL #4   Title  Independent in HEP 08/25/17    Time  6    Period  Weeks     Status  New      PT LONG TERM GOAL #5   Title  Improve to maintain FOTO at current level 08/25/17    Time  6    Period  Weeks    Status  New            Plan - 07/17/17 1807    Clinical Impression Statement  Pt demonstrated decreased Rt cervical rotation today.  She required some cues to correct form with exercises from HEP.  Pt very sensitive to pressure in Rt suboccipital and Rt cervical/thoracic paraspinals.  Pt tolerated exercises well, with no increase in pain.      Rehab Potential  Good    PT Frequency  2x / week    PT Duration  6 weeks    PT Treatment/Interventions  Patient/family education;ADLs/Self Care Home Management;Cryotherapy;Electrical Stimulation;Iontophoresis 4mg /ml Dexamethasone;Moist Heat;Ultrasound;Dry needling;Manual techniques;Neuromuscular re-education;Therapeutic activities;Therapeutic exercise    PT Next Visit Plan  manual therapy to Rt upper quadrant; add supine static snow angel.      Consulted and Agree with Plan of Care  Patient       Patient will benefit from skilled therapeutic intervention in order to improve the following deficits and impairments:  Postural dysfunction, Improper body mechanics, Pain, Increased fascial restricitons, Increased muscle spasms, Decreased range of motion, Decreased activity tolerance  Visit Diagnosis: Acute pain of left shoulder  Abnormal posture  Other symptoms and signs involving the musculoskeletal system     Problem List Patient Active Problem List   Diagnosis Date Noted  . Right hand pain 06/24/2017  . Impingement syndrome of shoulder, right 06/24/2017  . Dupuytren's contracture of right hand 06/24/2017  . DIABETES MELLITUS, TYPE II 02/16/2010  . HYPERLIPIDEMIA 02/16/2010  . DEPRESSION 02/16/2010  . HYPERTENSION 02/16/2010  . GERD 02/16/2010  . KNEE PAIN, LEFT, ACUTE 02/16/2010   This entire  session was performed under direct supervision and direction of a licensed physical therapist assistant. I have  personally read, edited and approve of the note as written.  Kerin Perna, PTA 07/17/17 6:10 PM  [place], [name], [address] Phone: [phone]   Fax:  [phone]  Name: Tonya Chandler MRN: [id number] Date of Birth: 06/19/1960

## 2017-07-21 ENCOUNTER — Ambulatory Visit (INDEPENDENT_AMBULATORY_CARE_PROVIDER_SITE_OTHER): Payer: BLUE CROSS/BLUE SHIELD | Admitting: Rehabilitative and Restorative Service Providers"

## 2017-07-21 ENCOUNTER — Encounter: Payer: Self-pay | Admitting: Rehabilitative and Restorative Service Providers"

## 2017-07-21 DIAGNOSIS — R29898 Other symptoms and signs involving the musculoskeletal system: Secondary | ICD-10-CM

## 2017-07-21 DIAGNOSIS — M25511 Pain in right shoulder: Secondary | ICD-10-CM

## 2017-07-21 DIAGNOSIS — R293 Abnormal posture: Secondary | ICD-10-CM

## 2017-07-21 NOTE — Patient Instructions (Signed)
Neurovascular: Median Nerve Stretch - Supine   NO PAIN with this stretch - repeat on both sides  Lie with neck supported, side-bent away from moving arm. Hold right arm out to side, elbow bent, thumb down, fingers and wrist bent back. Slowly straighten elbowas far as possible without pain. Hold for __60__ seconds. Repeat _2___ times 2 times per day  Trigger Point Dry Needling  . What is Trigger Point Dry Needling (DN)? o DN is a physical therapy technique used to treat muscle pain and dysfunction. Specifically, DN helps deactivate muscle trigger points (muscle knots).  o A thin filiform needle is used to penetrate the skin and stimulate the underlying trigger point. The goal is for a local twitch response (LTR) to occur and for the trigger point to relax. No medication of any kind is injected during the procedure.   . What Does Trigger Point Dry Needling Feel Like?  o The procedure feels different for each individual patient. Some patients report that they do not actually feel the needle enter the skin and overall the process is not painful. Very mild bleeding may occur. However, many patients feel a deep cramping in the muscle in which the needle was inserted. This is the local twitch response.   Marland Kitchen How Will I feel after the treatment? o Soreness is normal, and the onset of soreness may not occur for a few hours. Typically this soreness does not last longer than two days.  o Bruising is uncommon, however; ice can be used to decrease any possible bruising.  o In rare cases feeling tired or nauseous after the treatment is normal. In addition, your symptoms may get worse before they get better, this period will typically not last longer than 24 hours.   . What Can I do After My Treatment? o Increase your hydration by drinking more water for the next 24 hours. o You may place ice or heat on the areas treated that have become sore, however, do not use heat on inflamed or bruised areas. Heat often  brings more relief post needling. o You can continue your regular activities, but vigorous activity is not recommended initially after the treatment for 24 hours. o DN is best combined with other physical therapy such as strengthening, stretching, and other therapies.

## 2017-07-21 NOTE — Therapy (Signed)
[place], [name], [address] Phone: [phone]   Fax:  [phone]  Physical Therapy Treatment  Patient Details  Name: Tonya Chandler MRN: [id number] Date of Birth: November 06, 1960 Referring Provider: Dr Dianah Field    Encounter Date: 07/21/2017  PT End of Session - 07/21/17 1604    Visit Number  3    Number of Visits  12    Date for PT Re-Evaluation  08/25/17    PT Start Time  1602    PT Stop Time  1659    PT Time Calculation (min)  57 min    Activity Tolerance  Patient tolerated treatment well       Past Medical History:  Diagnosis Date  . Anxiety   . Depression   . Diabetes mellitus   . Gastro-esophageal reflux   . Hyperlipidemia   . Hypertension     Past Surgical History:  Procedure Laterality Date  . ABDOMINAL HYSTERECTOMY    . CARPAL TUNNEL RELEASE Right 10/2016  . PIP JOINT FUSION     pinky    There were no vitals filed for this visit.  Subjective Assessment - 07/21/17 1605    Subjective  Can't tell much difference in the shoulder pain. Can turn her head more after manual work last visit.     Currently in Pain?  Yes    Pain Score  3     Pain Location  Shoulder    Pain Orientation  Right    Pain Descriptors / Indicators  Dull;Nagging;Constant    Pain Onset  More than a month ago    Pain Frequency  Constant         [facility] PT Assessment - 07/21/17 0001      Assessment   Medical Diagnosis  Rt shoulder pain     Referring Provider  Dr Dianah Field     Onset Date/Surgical Date  04/21/17    Hand Dominance  Left    Next MD Visit  07/22/17      Sensation   Additional Comments  numbness and tingling Rt hand since surgery       Posture/Postural Control   Posture Comments  head forward; shoulders rounded and elevated; head of the humerus anterior in orientation       Palpation   Spinal mobility  hypomobiilty and pain with CPA and lateral mobs upper thoracic and cervical spine     Palpation comment  muscular tightness Rt > Lt ant/lat/posterior cervical musculature; pecs; upper trap; leveator; periscapular musculature; teres       Special Tests   Other special tests  (+) neural tension test bilat UE's                    [facility] Adult PT Treatment/Exercise - 07/21/17 0001      Shoulder Exercises: Supine   Other Supine Exercises  chin tuck; nodding yes/nodding no x 3-5 reps       Shoulder Exercises: Seated   Other Seated Exercises  scap squeeze with L's, W's x 5 sec hold x 10 reps      Shoulder Exercises: Standing   Other Standing Exercises  axial extension 10 sec x 5; scap squeeze 10 sec x 5; L's x 10; W's x 10 with swim noodle       Shoulder Exercises: Stretch   Other Shoulder Stretches  3 way doorwya stretch 30 sec x 3 reps       Moist Heat Therapy  Number Minutes Moist Heat  15 Minutes    Moist Heat Location  Cervical;Shoulder Rt      Electrical Stimulation   Electrical Stimulation Location  Rt cervical and shoulder areas     Electrical Stimulation Action  IFC    Electrical Stimulation Parameters  to tolerance    Electrical Stimulation Goals  Pain;Tone      Manual Therapy   Manual therapy comments  pt supine    Joint Mobilization  cervical CPA and lateral mobs through the cervical spine     Soft tissue mobilization  deep tissue work through the Rt > Lt cervical musculature - ant/middle/posterior cerviical musculature; upper trap; pecs     Myofascial Release  anterior cervical to pecs     Passive ROM  gentle stretch cervical spine     Manual Traction  manual traction 2-3 reps for 20-30 sec        Trigger Point Dry Needling - 07/21/17 1659    Consent Given?  Yes    Education Handout Provided  Yes    Muscles Treated Upper Body  Sternocleidomastoid;Scalenes;Upper trapezius;Longissimus scaleni Rt     Sternocleidomastoid Response  Palpable increased muscle length    Scalenes Response  Palpable increased muscle length    Longissimus Response   Palpable increased muscle length           PT Education - 07/21/17 1658    Education Details  HEP DN     Person(s) Educated  Patient    Methods  Explanation;Demonstration;Tactile cues;Verbal cues;Handout    Comprehension  Verbalized understanding;Returned demonstration;Verbal cues required;Tactile cues required          PT Long Term Goals - 07/21/17 1604      PT LONG TERM GOAL #1   Title  Improve posture and alignment with patient to demonstrate improve upright postue with posterior shoulder girdle engaged. 08/25/17    Time  6    Period  Weeks    Status  On-going      PT LONG TERM GOAL #2   Title  Decrease pain Rt upper quarter by 50-75% allowing patient to participate in normal functional activities with less pain and discomfort 08/25/17    Time  6    Period  Weeks    Status  On-going      PT LONG TERM GOAL #3   Title  Increase cervical and shoulder ROM to painful WNL's 08/25/17    Time  6    Period  Weeks    Status  On-going      PT LONG TERM GOAL #4   Title  Independent in HEP 08/25/17    Time  6    Period  Weeks    Status  On-going      PT LONG TERM GOAL #5   Title  Improve to maintain FOTO at current level 08/25/17    Time  6    Period  Weeks    Status  On-going            Plan - 07/21/17 1655    Clinical Impression Statement  Significant tightness and cervical limitation in ROM through the Rt cervical spine. Good response to DN and manual work with decreased muscular tightness to palpation and improved mobility through the cervical spine following treatment.     Rehab Potential  Good    PT Frequency  2x / week    PT Duration  6 weeks    PT Treatment/Interventions  Patient/family education;ADLs/Self Care  Home Management;Cryotherapy;Electrical Stimulation;Iontophoresis 4mg /ml Dexamethasone;Moist Heat;Ultrasound;Dry needling;Manual techniques;Neuromuscular re-education;Therapeutic activities;Therapeutic exercise    PT Next Visit Plan  manual therapy to Rt  upper quadrant; add supine static snow angel, further assess neural tightness and response to DN.      Consulted and Agree with Plan of Care  Patient       Patient will benefit from skilled therapeutic intervention in order to improve the following deficits and impairments:  Postural dysfunction, Improper body mechanics, Pain, Increased fascial restricitons, Increased muscle spasms, Decreased range of motion, Decreased activity tolerance  Visit Diagnosis: Acute pain of right shoulder  Abnormal posture  Other symptoms and signs involving the musculoskeletal system     Problem List Patient Active Problem List   Diagnosis Date Noted  . Right hand pain 06/24/2017  . Impingement syndrome of shoulder, right 06/24/2017  . Dupuytren's contracture of right hand 06/24/2017  . DIABETES MELLITUS, TYPE II 02/16/2010  . HYPERLIPIDEMIA 02/16/2010  . DEPRESSION 02/16/2010  . HYPERTENSION 02/16/2010  . GERD 02/16/2010  . KNEE PAIN, LEFT, ACUTE 02/16/2010    Rhiley Tarver Nilda Simmer PT, MPH  07/21/2017, 5:00 PM  [facility] [place], [name], [address] Phone: [phone]   Fax:  [phone]  Name: Tonya Chandler MRN: [id number] Date of Birth: November 25, 1960

## 2017-07-22 ENCOUNTER — Encounter: Payer: Self-pay | Admitting: Sports Medicine

## 2017-07-22 ENCOUNTER — Ambulatory Visit (INDEPENDENT_AMBULATORY_CARE_PROVIDER_SITE_OTHER): Payer: BLUE CROSS/BLUE SHIELD | Admitting: Sports Medicine

## 2017-07-22 DIAGNOSIS — M7541 Impingement syndrome of right shoulder: Secondary | ICD-10-CM

## 2017-07-22 DIAGNOSIS — M79641 Pain in right hand: Secondary | ICD-10-CM

## 2017-07-22 MED ORDER — CYCLOBENZAPRINE HCL 10 MG PO TABS: ORAL_TABLET | ORAL | 11 refills | Status: DC

## 2017-07-22 NOTE — Progress Notes (Addendum)
Subjective:    CC: Follow-up  HPI: Right hand pain: Previous injection performed the fourth flexor tendon sheath has resolved all pain here, continues to have some discomfort over the volar first MCP that seems to be referrable to a digital nerve running across the joint.  Right shoulder pain: Doing much better with physical therapy, 70% improvement after only 3 sessions and not yet plateaued.  I reviewed the past medical history, family history, social history, surgical history, and allergies today and no changes were needed.  Please see the problem list section below in epic for further details.  Past Medical History: Past Medical History:  Diagnosis Date  . Anxiety   . Depression   . Diabetes mellitus   . Gastro-esophageal reflux   . Hyperlipidemia   . Hypertension    Past Surgical History: Past Surgical History:  Procedure Laterality Date  . ABDOMINAL HYSTERECTOMY    . CARPAL TUNNEL RELEASE Right 10/2016  . PIP JOINT FUSION     pinky   Social History: Social History   Socioeconomic History  . Marital status: Married    Spouse name: Not on file  . Number of children: Not on file  . Years of education: Not on file  . Highest education level: Not on file  Occupational History  . Not on file  Social Needs  . Financial resource strain: Not on file  . Food insecurity:    Worry: Not on file    Inability: Not on file  . Transportation needs:    Medical: Not on file    Non-medical: Not on file  Tobacco Use  . Smoking status: Never Smoker  . Smokeless tobacco: Never Used  Substance and Sexual Activity  . Alcohol use: No  . Drug use: No  . Sexual activity: Yes  Lifestyle  . Physical activity:    Days per week: Not on file    Minutes per session: Not on file  . Stress: Not on file  Relationships  . Social connections:    Talks on phone: Not on file    Gets together: Not on file    Attends religious service: Not on file    Active member of club or  organization: Not on file    Attends meetings of clubs or organizations: Not on file    Relationship status: Not on file  Other Topics Concern  . Not on file  Social History Narrative  . Not on file   Family History: Family History  Problem Relation Age of Onset  . Diabetes Mother   . Dementia Mother   . Heart attack Father    Allergies: Allergies  Allergen Reactions  . Ampicillin   . Cefprozil   . Meloxicam    Medications: See med rec.  Review of Systems: No fevers, chills, night sweats, weight loss, chest pain, or shortness of breath.   Objective:    General: Well Developed, well nourished, and in no acute distress.  Neuro: Alert and oriented x3, extra-ocular muscles intact, sensation grossly intact.  HEENT: Normocephalic, atraumatic, pupils equal round reactive to light, neck supple, no masses, no lymphadenopathy, thyroid nonpalpable.  Skin: Warm and dry, no rashes. Cardiac: Regular rate and rhythm, no murmurs rubs or gallops, no lower extremity edema.  Respiratory: Clear to auscultation bilaterally. Not using accessory muscles, speaking in full sentences. Right hand: Fourth flexor tendon sheath is pain-free, no triggering, still has some tenderness over the volar thumb over the palpable proper digital nerve.  Procedure: Real-time  Ultrasound Guided Hydro dissection of right thumb proper digital nerve Device: GE Logiq E  Verbal informed consent obtained.  Time-out conducted.  Noted no overlying erythema, induration, or other signs of local infection.  Skin prepped in a sterile fashion.  Local anesthesia: Topical Ethyl chloride.  With sterile technique and under real time ultrasound guidance: Visualized proper digital nerve overlying first MCP, 25-gauge needle advanced to the nerve and injected medication both superficial to and deep to the nerve taking care to avoid intraneural injection, needle was redirected and I also placed medication into the flexor pollicis longus  tendon sheath for a total of 0.5 cc Kenalog 40, 0.5 cc lidocaine Completed without difficulty  Pain immediately resolved suggesting accurate placement of the medication.  Advised to call if fevers/chills, erythema, induration, drainage, or persistent bleeding.  Images permanently stored and available for review in the ultrasound unit.  Impression: Technically successful ultrasound guided injection.  Impression and Recommendations:    Right hand pain Right fourth flexor tendon sheath is now pain-free. She still has some pain or not a palpable digital nerve on the volar aspect of her right thumb, hydrodissection performed today around the proper digital nerves as well as the flexor pollicis longus tendon. Return in 1 month for this.  Impingement syndrome of shoulder, right Continues to improve with formal physical therapy, has only had 3 sessions and not plateaued, we will give her at least another month. ___________________________________________ Gwen Her. Dianah Field, M.D., ABFM., CAQSM. Primary Care and [place] In[employer] of [employer] [facility]

## 2017-07-22 NOTE — Addendum Note (Signed)
Addended by: Silverio Decamp on: 07/22/2017 10:02 AM   Modules accepted: Orders

## 2017-07-22 NOTE — Assessment & Plan Note (Addendum)
Right fourth flexor tendon sheath is now pain-free. She still has some pain or not a palpable digital nerve on the volar aspect of her right thumb, hydrodissection performed today around the proper digital nerves as well as the flexor pollicis longus tendon. Return in 1 month for this.

## 2017-07-22 NOTE — Assessment & Plan Note (Signed)
Continues to improve with formal physical therapy, has only had 3 sessions and not plateaued, we will give her at least another month.

## 2017-07-28 ENCOUNTER — Encounter: Payer: Self-pay | Admitting: Rehabilitative and Restorative Service Providers"

## 2017-07-28 ENCOUNTER — Ambulatory Visit (INDEPENDENT_AMBULATORY_CARE_PROVIDER_SITE_OTHER): Payer: BLUE CROSS/BLUE SHIELD | Admitting: Rehabilitative and Restorative Service Providers"

## 2017-07-28 DIAGNOSIS — M25511 Pain in right shoulder: Secondary | ICD-10-CM | POA: Diagnosis not present

## 2017-07-28 DIAGNOSIS — R293 Abnormal posture: Secondary | ICD-10-CM

## 2017-07-28 DIAGNOSIS — R29898 Other symptoms and signs involving the musculoskeletal system: Secondary | ICD-10-CM

## 2017-07-28 NOTE — Therapy (Signed)
[place], [name], [address] Phone: [phone]   Fax:  [phone]  Physical Therapy Treatment  Patient Details  Name: Tonya Chandler MRN: [id number] Date of Birth: 23-Jun-1960 Referring Provider: Dr Dianah Field    Encounter Date: 07/28/2017  PT End of Session - 07/28/17 0804    Visit Number  4    Number of Visits  12    Date for PT Re-Evaluation  08/25/17    PT Start Time  0804    PT Stop Time  0901    PT Time Calculation (min)  57 min    Activity Tolerance  Patient tolerated treatment well       Past Medical History:  Diagnosis Date  . Anxiety   . Depression   . Diabetes mellitus   . Gastro-esophageal reflux   . Hyperlipidemia   . Hypertension     Past Surgical History:  Procedure Laterality Date  . ABDOMINAL HYSTERECTOMY    . CARPAL TUNNEL RELEASE Right 10/2016  . PIP JOINT FUSION     pinky    There were no vitals filed for this visit.  Subjective Assessment - 07/28/17 0809    Subjective  Some improvement - thinks the DN helped. Still has some soreness in the trap area.     Currently in Pain?  Yes    Pain Score  3     Pain Location  Shoulder    Pain Orientation  Right    Pain Descriptors / Indicators  Dull;Nagging    Pain Type  Acute pain    Pain Onset  More than a month ago    Pain Frequency  Intermittent         [facility] PT Assessment - 07/28/17 0001      Assessment   Medical Diagnosis  Rt shoulder pain     Referring Provider  Dr Dianah Field     Onset Date/Surgical Date  04/21/17    Hand Dominance  Left    Next MD Visit  07/22/17      Sensation   Additional Comments  numbness and tingling Rt hand since surgery       Posture/Postural Control   Posture Comments  head forward; shoulders rounded and elevated; head of the humerus anterior in orientation       AROM   Right Shoulder Extension  62 Degrees    Right Shoulder Flexion  153 Degrees    Right Shoulder ABduction   164 Degrees    Right Shoulder Internal Rotation  39 Degrees    Right Shoulder External Rotation  95 Degrees                   [facility] Adult PT Treatment/Exercise - 07/28/17 0001      Shoulder Exercises: ROM/Strengthening   UBE (Upper Arm Bike)  L1 to L2 1 min fwd/1 min back       Shoulder Exercises: Stretch   Other Shoulder Stretches  3 way doorwya stretch 30 sec x 3 reps     Other Shoulder Stretches  reach for floor upper trap stretch 10 sec x 4; lateral cervical flexion 10 sec x 3       Moist Heat Therapy   Number Minutes Moist Heat  20 Minutes    Moist Heat Location  Cervical;Shoulder Rt      Electrical Stimulation   Electrical Stimulation Location  Rt cervical and shoulder areas     Electrical Stimulation Action  IFC  Electrical Stimulation Parameters  to tolerance    Electrical Stimulation Goals  Pain;Tone      Manual Therapy   Manual therapy comments  pt prone     Joint Mobilization  cervical CPA and lateral mobs through the cervical spine     Soft tissue mobilization  deep tissue work through the Rt > Lt cervical musculature - ant/middle/posterior cerviical musculature; upper trap; pecs     Myofascial Release  posterior cervical to upper trap        Trigger Point Dry Needling - 07/28/17 0842    Consent Given?  Yes    Muscles Treated Upper Body  -- Rt with estim     Upper Trapezius Response  Palpable increased muscle length;Twitch reponse elicited    SubOccipitals Response  Palpable increased muscle length    Levator Scapulae Response  Palpable increased muscle length;Twitch response elicited    Longissimus Response  Palpable increased muscle length           PT Education - 07/28/17 0830    Education Details  HEP    Person(s) Educated  Patient    Methods  Explanation;Demonstration;Tactile cues;Verbal cues;Handout    Comprehension  Verbalized understanding;Returned demonstration;Verbal cues required;Tactile cues required          PT Long Term  Goals - 07/28/17 0805      PT LONG TERM GOAL #1   Title  Improve posture and alignment with patient to demonstrate improve upright postue with posterior shoulder girdle engaged. 08/25/17    Time  6    Period  Weeks    Status  On-going      PT LONG TERM GOAL #2   Title  Decrease pain Rt upper quarter by 50-75% allowing patient to participate in normal functional activities with less pain and discomfort 08/25/17    Time  6    Period  Weeks    Status  On-going      PT LONG TERM GOAL #3   Title  Increase cervical and shoulder ROM to painful WNL's 08/25/17    Time  6    Period  Weeks    Status  On-going      PT LONG TERM GOAL #4   Title  Independent in HEP 08/25/17    Time  6    Period  Weeks    Status  On-going      PT LONG TERM GOAL #5   Title  Improve to maintain FOTO at current level 08/25/17    Time  6    Period  Weeks    Status  On-going            Plan - 07/28/17 0810    Clinical Impression Statement  Patient reports some improvement - symptoms are no longer constant. Good gains in shoulder ROM. Gradual progress continues toward stated goals of therapy.     Rehab Potential  Good    PT Frequency  2x / week    PT Duration  6 weeks    PT Treatment/Interventions  Patient/family education;ADLs/Self Care Home Management;Cryotherapy;Electrical Stimulation;Iontophoresis 4mg /ml Dexamethasone;Moist Heat;Ultrasound;Dry needling;Manual techniques;Neuromuscular re-education;Therapeutic activities;Therapeutic exercise    PT Next Visit Plan  manual therapy to Rt upper quadrant; add supine static snow angel, continue DN as indicated.      Consulted and Agree with Plan of Care  Patient       Patient will benefit from skilled therapeutic intervention in order to improve the following deficits and impairments:  Postural dysfunction, Improper  body mechanics, Pain, Increased fascial restricitons, Increased muscle spasms, Decreased range of motion, Decreased activity tolerance  Visit  Diagnosis: Acute pain of right shoulder  Abnormal posture  Other symptoms and signs involving the musculoskeletal system     Problem List Patient Active Problem List   Diagnosis Date Noted  . Right hand pain 06/24/2017  . Impingement syndrome of shoulder, right 06/24/2017  . Dupuytren's contracture of right hand 06/24/2017  . DIABETES MELLITUS, TYPE II 02/16/2010  . HYPERLIPIDEMIA 02/16/2010  . DEPRESSION 02/16/2010  . HYPERTENSION 02/16/2010  . GERD 02/16/2010  . KNEE PAIN, LEFT, ACUTE 02/16/2010    Lodie Waheed Nilda Simmer PT, MPH  07/28/2017, 8:49 AM  [facility] [place], [name], [address] Phone: [phone]   Fax:  [phone]  Name: Tonya Chandler MRN: [id number] Date of Birth: January 14, 1961

## 2017-07-28 NOTE — Patient Instructions (Addendum)
   Standing tall - lift chest and reach for the floor with both hands  Hold 10-15 sec release and repeat 3-4 times    Side Bend, Sitting    Sit, head in comfortable, centered position, chin slightly tucked. Gently tilt head, bringing ear toward same-side shoulder. Hold _5-10__ seconds.  Repeat _2-3__ times per session. Do _2-3__ sessions per day.

## 2017-07-31 ENCOUNTER — Ambulatory Visit (INDEPENDENT_AMBULATORY_CARE_PROVIDER_SITE_OTHER): Payer: BLUE CROSS/BLUE SHIELD | Admitting: Physical Therapy

## 2017-07-31 DIAGNOSIS — R293 Abnormal posture: Secondary | ICD-10-CM

## 2017-07-31 DIAGNOSIS — M25511 Pain in right shoulder: Secondary | ICD-10-CM

## 2017-07-31 DIAGNOSIS — R29898 Other symptoms and signs involving the musculoskeletal system: Secondary | ICD-10-CM | POA: Diagnosis not present

## 2017-07-31 NOTE — Patient Instructions (Signed)
Resisted External Rotation: in Neutral - Bilateral  PALMS UP!!! Sit or stand, tubing in both hands, elbows at sides, bent to 90, forearms forward. Pinch shoulder blades together and rotate forearms out. Keep elbows at sides. Repeat __10__ times per set. Do __2-3__ sets per session. Do __3-4__ sessions per week.  Resistive Band Rowing   With resistive band anchored in door, grasp both ends. Keeping elbows bent, pull back, squeezing shoulder blades together. Hold _3-5___ seconds. Repeat _10-30___ times. Do __1__ sessions per day.   Strengthening: Resisted Extension   Hold tubing with both hands, arms forward. Pull arms back, elbow straight. Repeat _10-30___ times per set. Do ____ sets per session. Do _1___ sessions per day.

## 2017-07-31 NOTE — Therapy (Signed)
[place], [name], [address] Phone: [phone]   Fax:  [phone]  Physical Therapy Treatment  Patient Details  Name: Tonya Chandler MRN: [id number] Date of Birth: Oct 09, 1960 Referring Provider: Dr. Dianah Field   Encounter Date: 07/31/2017  PT End of Session - 07/31/17 1659    Visit Number  5    Number of Visits  12    Date for PT Re-Evaluation  08/25/17    PT Start Time  1027    PT Stop Time  1655    PT Time Calculation (min)  40 min    Activity Tolerance  Patient tolerated treatment well    Behavior During Therapy  [facility] for tasks assessed/performed       Past Medical History:  Diagnosis Date  . Anxiety   . Depression   . Diabetes mellitus   . Gastro-esophageal reflux   . Hyperlipidemia   . Hypertension     Past Surgical History:  Procedure Laterality Date  . ABDOMINAL HYSTERECTOMY    . CARPAL TUNNEL RELEASE Right 10/2016  . PIP JOINT FUSION     pinky    There were no vitals filed for this visit.  Subjective Assessment - 07/31/17 1610    Subjective  Pt reports she is still sore from DN last session.  She took off last week and was painting shoulder to ankle height structures. She is back to driving for last 3 days (600-700 miles).  She has been doing exercises and neck stretches in the car.     Patient Stated Goals  get rid of shoulder pain     Currently in Pain?  Yes    Pain Score  4     Pain Location  Shoulder    Pain Orientation  Right    Pain Descriptors / Indicators  Aching;Dull    Pain Radiating Towards  up into neck    Aggravating Factors   lifting, driving, carrying     Pain Relieving Factors  TENS, aspirin, heat         [facility] PT Assessment - 07/31/17 0001      Assessment   Medical Diagnosis  Rt shoulder pain     Referring Provider  Dr. Dianah Field    Onset Date/Surgical Date  04/21/17    Hand Dominance  Left    Next MD Visit  08/25/17      AROM   Cervical  Flexion  50    Cervical Extension  30    Cervical - Right Side Bend  40    Cervical - Left Side Bend  35    Cervical - Right Rotation  45    Cervical - Left Rotation  60        [facility] Adult PT Treatment/Exercise - 07/31/17 0001      Shoulder Exercises: Standing   External Rotation  Strengthening;Both;10 reps;Theraband    Theraband Level (Shoulder External Rotation)  Level 1 (Yellow);Level 2 (Red)    Other Standing Exercises  Row and shoulder ext (yellow band) with 3 sec pause in retraction x 10 reps;       Shoulder Exercises: ROM/Strengthening   UBE (Upper Arm Bike)  L1: 1.5 min fwd/1.5 min back       Shoulder Exercises: Stretch   Other Shoulder Stretches  3 way doorway stretch 30 sec x 3 reps     Other Shoulder Stretches  reach for floor upper trap stretch 10 sec x 4; lateral cervical flexion  10 sec x 3       Modalities   Modalities  -- pt declined, time constraints      Moist Heat Therapy   Number Minutes Moist Heat  --    Moist Heat Location  --      Acupuncturist Location  --    Electrical Stimulation Action  --    Electrical Stimulation Parameters  --    Electrical Stimulation Goals  --      Manual Therapy   Manual therapy comments  pt supine    Soft tissue mobilization  STM to Rt upper trap, levator, scalenes, rhomboid and thoracic paraspinals.  suboccipital release.     Myofascial Release  anterior cervical to pecs; Rt pec release, suboccipital release     Manual Traction  manual traction 2-3 reps for 20-30 sec       Neck Exercises: Stretches   Other Neck Stretches  seated with forearms on thighs, cervical flex to neutral and cervical diagonals x 5 each.              PT Education - 07/31/17 1702    Education Details  HEP - added standing resistance and issued yellow band    Person(s) Educated  Patient    Methods  Explanation;Demonstration;Tactile cues;Verbal cues;Handout    Comprehension  Verbalized  understanding;Returned demonstration          PT Long Term Goals - 07/31/17 1617      PT LONG TERM GOAL #1   Title  Improve posture and alignment with patient to demonstrate improve upright postue with posterior shoulder girdle engaged. 08/25/17    Time  6    Period  Weeks    Status  On-going      PT LONG TERM GOAL #2   Title  Decrease pain Rt upper quarter by 50-75% allowing patient to participate in normal functional activities with less pain and discomfort 08/25/17    Time  6    Period  Weeks    Status  Partially Met 50% reduction, reported 07/31/17.      PT LONG TERM GOAL #3   Title  Increase cervical and shoulder ROM to painful WNL's 08/25/17    Time  6    Period  Weeks    Status  Partially Met      PT LONG TERM GOAL #4   Title  Independent in HEP 08/25/17    Time  6    Period  Weeks    Status  On-going      PT LONG TERM GOAL #5   Title  Improve to maintain FOTO at current level 08/25/17    Time  6    Period  Weeks    Status  On-going            Plan - 07/31/17 1803    Clinical Impression Statement  Pt demonstrated improved Rt shoulder and cervical ROM, especially after manual therapy.   She tolerated new exercises without increase in symptoms.  Pt has partially met LTG #2 and 3, and is progressing towards goals.      Rehab Potential  Good    PT Frequency  2x / week    PT Duration  6 weeks    PT Treatment/Interventions  Patient/family education;ADLs/Self Care Home Management;Cryotherapy;Electrical Stimulation;Iontophoresis 50m/ml Dexamethasone;Moist Heat;Ultrasound;Dry needling;Manual techniques;Neuromuscular re-education;Therapeutic activities;Therapeutic exercise    PT Next Visit Plan  manual therapy to Rt upper quadrant; assess response to new exercises, continue  DN as indicated.      Consulted and Agree with Plan of Care  Patient       Patient will benefit from skilled therapeutic intervention in order to improve the following deficits and impairments:  Postural  dysfunction, Improper body mechanics, Pain, Increased fascial restricitons, Increased muscle spasms, Decreased range of motion, Decreased activity tolerance  Visit Diagnosis: Acute pain of right shoulder  Abnormal posture  Other symptoms and signs involving the musculoskeletal system     Problem List Patient Active Problem List   Diagnosis Date Noted  . Right hand pain 06/24/2017  . Impingement syndrome of shoulder, right 06/24/2017  . Dupuytren's contracture of right hand 06/24/2017  . DIABETES MELLITUS, TYPE II 02/16/2010  . HYPERLIPIDEMIA 02/16/2010  . DEPRESSION 02/16/2010  . HYPERTENSION 02/16/2010  . GERD 02/16/2010  . KNEE PAIN, LEFT, ACUTE 02/16/2010   Kerin Perna, PTA 07/31/17 6:07 PM  [place], [name], [address] Phone: [phone]   Fax:  [phone]  Name: Lucely Leard MRN: [id number] Date of Birth: November 09, 1960

## 2017-08-04 ENCOUNTER — Encounter: Payer: Self-pay | Admitting: Rehabilitative and Restorative Service Providers"

## 2017-08-04 ENCOUNTER — Ambulatory Visit (INDEPENDENT_AMBULATORY_CARE_PROVIDER_SITE_OTHER): Payer: BLUE CROSS/BLUE SHIELD | Admitting: Rehabilitative and Restorative Service Providers"

## 2017-08-04 DIAGNOSIS — R29898 Other symptoms and signs involving the musculoskeletal system: Secondary | ICD-10-CM | POA: Diagnosis not present

## 2017-08-04 DIAGNOSIS — M25511 Pain in right shoulder: Secondary | ICD-10-CM

## 2017-08-04 DIAGNOSIS — R293 Abnormal posture: Secondary | ICD-10-CM

## 2017-08-04 NOTE — Therapy (Signed)
[place], [name], [address] Phone: [phone]   Fax:  [phone]  Physical Therapy Treatment  Patient Details  Name: Tonya Chandler MRN: [id number] Date of Birth: 02/15/1960 Referring Provider: Dr Liam Graham    Encounter Date: 08/04/2017  PT End of Session - 08/04/17 1620    Visit Number  6    Number of Visits  12    Date for PT Re-Evaluation  08/25/17    PT Start Time  4097    PT Stop Time  1656    PT Time Calculation (min)  59 min    Activity Tolerance  Patient tolerated treatment well       Past Medical History:  Diagnosis Date  . Anxiety   . Depression   . Diabetes mellitus   . Gastro-esophageal reflux   . Hyperlipidemia   . Hypertension     Past Surgical History:  Procedure Laterality Date  . ABDOMINAL HYSTERECTOMY    . CARPAL TUNNEL RELEASE Right 10/2016  . PIP JOINT FUSION     pinky    There were no vitals filed for this visit.  Subjective Assessment - 08/04/17 1624    Subjective  Feeling much better. Still some tightness in the shoulder blade area. Does well with DN. She is doing her exercises without difficulty.     Currently in Pain?  Yes    Pain Score  3     Pain Location  Shoulder    Pain Orientation  Right    Pain Descriptors / Indicators  Aching;Dull    Pain Onset  More than a month ago         [facility] PT Assessment - 08/04/17 0001      Assessment   Medical Diagnosis  Rt shoulder pain     Referring Provider  Dr Liam Graham     Onset Date/Surgical Date  04/21/17    Hand Dominance  Left    Next MD Visit  08/25/17      Palpation   Palpation comment  decreased muscular tightness Rt > Lt ant/lat/posterior cervical musculature; pecs; upper trap; leveator; periscapular musculature; teres                    [facility] Adult PT Treatment/Exercise - 08/04/17 0001      Shoulder Exercises: ROM/Strengthening   UBE (Upper Arm Bike)  L2: 1 min fwd/1 min back  total 4 min alt       Shoulder Exercises: Stretch   Other Shoulder Stretches  3 way doorway stretch 30 sec x 3 reps     Other Shoulder Stretches  reach for floor upper trap stretch 10 sec x 4; lateral cervical flexion 10 sec x 3       Moist Heat Therapy   Number Minutes Moist Heat  20 Minutes    Moist Heat Location  Cervical;Shoulder      Electrical Stimulation   Electrical Stimulation Location  Rt cervical and shoulder area    Electrical Stimulation Action  IFC    Electrical Stimulation Parameters  to tolerance    Electrical Stimulation Goals  Pain;Tone      Manual Therapy   Manual therapy comments  pt prone     Soft tissue mobilization  deep tissue work through cervical and thoracic paraspinals; upper trap; leveator Rt > Lt     Myofascial Release  posterior cervical to thoracic        Trigger Point Dry Needling -  08/04/17 1645    Consent Given?  Yes    Muscles Treated Upper Body  -- Rt thoracic area with estim    Upper Trapezius Response  Palpable increased muscle length    Levator Scapulae Response  Palpable increased muscle length    Rhomboids Response  Palpable increased muscle length    Longissimus Response  Palpable increased muscle length Rt thoracic                 PT Long Term Goals - 08/04/17 1623      PT LONG TERM GOAL #1   Title  Improve posture and alignment with patient to demonstrate improve upright postue with posterior shoulder girdle engaged. 08/25/17    Time  6    Period  Weeks    Status  On-going      PT LONG TERM GOAL #2   Title  Decrease pain Rt upper quarter by 50-75% allowing patient to participate in normal functional activities with less pain and discomfort 08/25/17    Time  6    Period  Weeks    Status  Partially Met      PT LONG TERM GOAL #3   Title  Increase cervical and shoulder ROM to painful WNL's 08/25/17    Time  6    Period  Weeks    Status  Partially Met      PT LONG TERM GOAL #4   Time  6    Period  Weeks    Status   On-going      PT LONG TERM GOAL #5   Title  Improve to maintain FOTO at current level 08/25/17    Time  6    Period  Weeks    Status  On-going            Plan - 08/04/17 1620    Clinical Impression Statement  Continued improvement in pain and muscular tightness. She has less palpable tightness noted through cervical and thoracic musculature. She responded well to DN and manual work with decreased palpable tightness. Progressing well toward stated goals of therapy.     Rehab Potential  Good    PT Frequency  2x / week    PT Duration  6 weeks    PT Treatment/Interventions  Patient/family education;ADLs/Self Care Home Management;Cryotherapy;Electrical Stimulation;Iontophoresis 25m/ml Dexamethasone;Moist Heat;Ultrasound;Dry needling;Manual techniques;Neuromuscular re-education;Therapeutic activities;Therapeutic exercise    PT Next Visit Plan  manual therapy to Rt upper quadrant; assess response to new exercises, continue DN as indicated.      Consulted and Agree with Plan of Care  Patient       Patient will benefit from skilled therapeutic intervention in order to improve the following deficits and impairments:  Postural dysfunction, Improper body mechanics, Pain, Increased fascial restricitons, Increased muscle spasms, Decreased range of motion, Decreased activity tolerance  Visit Diagnosis: Acute pain of right shoulder  Abnormal posture  Other symptoms and signs involving the musculoskeletal system     Problem List Patient Active Problem List   Diagnosis Date Noted  . Right hand pain 06/24/2017  . Impingement syndrome of shoulder, right 06/24/2017  . Dupuytren's contracture of right hand 06/24/2017  . DIABETES MELLITUS, TYPE II 02/16/2010  . HYPERLIPIDEMIA 02/16/2010  . DEPRESSION 02/16/2010  . HYPERTENSION 02/16/2010  . GERD 02/16/2010  . KNEE PAIN, LEFT, ACUTE 02/16/2010    Jashan Cotten PNilda SimmerPT, MPH  08/04/2017, 4:49 PM  [facility]  [place] [address] Phone: [phone]   Fax:  [phone]  Name: Tonya Chandler MRN: [id number] Date of Birth: 12-Jan-1961

## 2017-08-07 ENCOUNTER — Ambulatory Visit (INDEPENDENT_AMBULATORY_CARE_PROVIDER_SITE_OTHER): Payer: BLUE CROSS/BLUE SHIELD | Admitting: Rehabilitative and Restorative Service Providers"

## 2017-08-07 ENCOUNTER — Encounter: Payer: Self-pay | Admitting: Rehabilitative and Restorative Service Providers"

## 2017-08-07 DIAGNOSIS — M25511 Pain in right shoulder: Secondary | ICD-10-CM | POA: Diagnosis not present

## 2017-08-07 DIAGNOSIS — R293 Abnormal posture: Secondary | ICD-10-CM

## 2017-08-07 DIAGNOSIS — R29898 Other symptoms and signs involving the musculoskeletal system: Secondary | ICD-10-CM

## 2017-08-07 NOTE — Therapy (Signed)
[place], [name], [address] Phone: [phone]   Fax:  [phone]  Physical Therapy Treatment  Patient Details  Name: Tonya Chandler MRN: [id number] Date of Birth: Apr 23, 1960 Referring Provider: Dr Liam Graham    Encounter Date: 08/07/2017  PT End of Session - 08/07/17 1532    Visit Number  7    Number of Visits  12    Date for PT Re-Evaluation  08/25/17    PT Start Time  6962    PT Stop Time  1628    PT Time Calculation (min)  57 min    Activity Tolerance  Patient tolerated treatment well       Past Medical History:  Diagnosis Date  . Anxiety   . Depression   . Diabetes mellitus   . Gastro-esophageal reflux   . Hyperlipidemia   . Hypertension     Past Surgical History:  Procedure Laterality Date  . ABDOMINAL HYSTERECTOMY    . CARPAL TUNNEL RELEASE Right 10/2016  . PIP JOINT FUSION     pinky    There were no vitals filed for this visit.  Subjective Assessment - 08/07/17 1533    Subjective  Patient reports that she continues to improve with less pain and improved activity. Having some continued pain in the top of the shoulder. Would like to try the DN for the upper shoulder area - it really helped the last time we did it.     Currently in Pain?  Yes    Pain Score  2     Pain Location  Shoulder    Pain Orientation  Right;Left    Pain Descriptors / Indicators  Aching;Dull    Pain Type  Acute pain    Pain Radiating Towards  into the neck less     Pain Onset  More than a month ago    Pain Frequency  Intermittent                       [facility] Adult PT Treatment/Exercise - 08/07/17 0001      Shoulder Exercises: ROM/Strengthening   UBE (Upper Arm Bike)  L2: 1 min fwd/1 min back total 4 min alt       Shoulder Exercises: Stretch   Other Shoulder Stretches  3 way doorway stretch 30 sec x 3 reps     Other Shoulder Stretches  reach for floor upper trap stretch 10 sec x 4;  lateral cervical flexion 10 sec x 3       Moist Heat Therapy   Number Minutes Moist Heat  20 Minutes    Moist Heat Location  Cervical;Shoulder      Electrical Stimulation   Electrical Stimulation Location  Rt cervical and shoulder area    Electrical Stimulation Action  IFC    Electrical Stimulation Parameters  to tolerance    Electrical Stimulation Goals  Pain;Tone      Manual Therapy   Manual therapy comments  pt sitting and supine    Joint Mobilization  cervical CPA and lateral mobs through the cervical spine     Soft tissue mobilization  deep tissue work through cervical and thoracic paraspinals; upper trap; leveator Rt > Lt     Myofascial Release  cervical to thoracic     Manual Traction  manual traction 2-3 reps for 20-30 sec        Trigger Point Dry Needling - 08/07/17 1557    Consent  Given?  Yes    Muscles Treated Upper Body  -- Rt with estim     Upper Trapezius Response  Palpable increased muscle length    Levator Scapulae Response  Palpable increased muscle length    Rhomboids Response  Palpable increased muscle length    Longissimus Response  Palpable increased muscle length Rt upper thoracic                 PT Long Term Goals - 08/04/17 1623      PT LONG TERM GOAL #1   Title  Improve posture and alignment with patient to demonstrate improve upright postue with posterior shoulder girdle engaged. 08/25/17    Time  6    Period  Weeks    Status  On-going      PT LONG TERM GOAL #2   Title  Decrease pain Rt upper quarter by 50-75% allowing patient to participate in normal functional activities with less pain and discomfort 08/25/17    Time  6    Period  Weeks    Status  Partially Met      PT LONG TERM GOAL #3   Title  Increase cervical and shoulder ROM to painful WNL's 08/25/17    Time  6    Period  Weeks    Status  Partially Met      PT LONG TERM GOAL #4   Time  6    Period  Weeks    Status  On-going      PT LONG TERM GOAL #5   Title  Improve to  maintain FOTO at current level 08/25/17    Time  6    Period  Weeks    Status  On-going            Plan - 08/07/17 1537    Clinical Impression Statement  Continued improvement with decreased pain and discomfort. Patient has persistent tightness through the upper trap musculature and leveator on Rt. Responds well to DN and manual work. Progressing well toward stated goals of therapy.     Rehab Potential  Good    PT Frequency  2x / week    PT Duration  6 weeks    PT Treatment/Interventions  Patient/family education;ADLs/Self Care Home Management;Cryotherapy;Electrical Stimulation;Iontophoresis '4mg'$ /ml Dexamethasone;Moist Heat;Ultrasound;Dry needling;Manual techniques;Neuromuscular re-education;Therapeutic activities;Therapeutic exercise    PT Next Visit Plan  manual therapy to Rt upper quadrant; assess response to DN    Consulted and Agree with Plan of Care  Patient       Patient will benefit from skilled therapeutic intervention in order to improve the following deficits and impairments:  Postural dysfunction, Improper body mechanics, Pain, Increased fascial restricitons, Increased muscle spasms, Decreased range of motion, Decreased activity tolerance  Visit Diagnosis: Acute pain of right shoulder  Abnormal posture  Other symptoms and signs involving the musculoskeletal system     Problem List Patient Active Problem List   Diagnosis Date Noted  . Right hand pain 06/24/2017  . Impingement syndrome of shoulder, right 06/24/2017  . Dupuytren's contracture of right hand 06/24/2017  . DIABETES MELLITUS, TYPE II 02/16/2010  . HYPERLIPIDEMIA 02/16/2010  . DEPRESSION 02/16/2010  . HYPERTENSION 02/16/2010  . GERD 02/16/2010  . KNEE PAIN, LEFT, ACUTE 02/16/2010    [name] PT, MPH  08/07/2017, 3:59 PM  [facility] [place], [name], [address] Phone: [phone]   Fax:  [phone]  Name: Tonya Chandler MRN: [id number] Date of  Birth: 1960-11-18

## 2017-08-11 ENCOUNTER — Encounter: Payer: Self-pay | Admitting: Physical Therapy

## 2017-08-14 ENCOUNTER — Encounter: Payer: Self-pay | Admitting: Rehabilitative and Restorative Service Providers"

## 2017-08-14 ENCOUNTER — Ambulatory Visit (INDEPENDENT_AMBULATORY_CARE_PROVIDER_SITE_OTHER): Payer: BLUE CROSS/BLUE SHIELD | Admitting: Rehabilitative and Restorative Service Providers"

## 2017-08-14 DIAGNOSIS — R293 Abnormal posture: Secondary | ICD-10-CM

## 2017-08-14 DIAGNOSIS — M25511 Pain in right shoulder: Secondary | ICD-10-CM | POA: Diagnosis not present

## 2017-08-14 DIAGNOSIS — R29898 Other symptoms and signs involving the musculoskeletal system: Secondary | ICD-10-CM

## 2017-08-14 NOTE — Therapy (Signed)
[place], [name], [address] Phone: [phone]   Fax:  [phone]  Physical Therapy Treatment  Patient Details  Name: Tonya Chandler MRN: [id number] Date of Birth: 1960/08/17 Referring Provider: Dr Liam Graham    Encounter Date: 08/14/2017  PT End of Session - 08/14/17 1503    Visit Number  8    Number of Visits  12    Date for PT Re-Evaluation  08/25/17    PT Start Time  1501    PT Stop Time  1552    PT Time Calculation (min)  51 min    Activity Tolerance  Patient tolerated treatment well       Past Medical History:  Diagnosis Date  . Anxiety   . Depression   . Diabetes mellitus   . Gastro-esophageal reflux   . Hyperlipidemia   . Hypertension     Past Surgical History:  Procedure Laterality Date  . ABDOMINAL HYSTERECTOMY    . CARPAL TUNNEL RELEASE Right 10/2016  . PIP JOINT FUSION     pinky    There were no vitals filed for this visit.  Subjective Assessment - 08/14/17 1505    Subjective  Patient reports some persistent symptoms in the Rt upper trap/neck area. Improved with DN/manual work and stretching but just keeps hanging on. Has tried to make some modifications with driving/work. Now sleeping without awakening due to pain.     Currently in Pain?  Yes    Pain Score  3     Pain Location  Neck    Pain Orientation  Right    Pain Descriptors / Indicators  Aching;Dull    Pain Type  Acute pain    Pain Radiating Towards  Into the shoulder     Pain Frequency  Intermittent                       [facility] Adult PT Treatment/Exercise - 08/14/17 0001      Shoulder Exercises: Stretch   Other Shoulder Stretches  3 way doorway stretch 30 sec x 3 reps     Other Shoulder Stretches  reach for floor upper trap stretch 10 sec x 4; lateral cervical flexion 10 sec x 3       Moist Heat Therapy   Number Minutes Moist Heat  20 Minutes    Moist Heat Location  Cervical;Shoulder       Electrical Stimulation   Electrical Stimulation Location  Rt cervical and shoulder area    Electrical Stimulation Action  TENS    Electrical Stimulation Parameters  to tolerance    Electrical Stimulation Goals  Pain;Tone      Manual Therapy   Manual therapy comments  pt sitting     Soft tissue mobilization  deep tissue work through cervical and thoracic paraspinals; upper trap; leveator Rt > Lt        Trigger Point Dry Needling - 08/14/17 1530    Consent Given?  Yes    Muscles Treated Upper Body  -- Rt with estim     Upper Trapezius Response  Palpable increased muscle length    Levator Scapulae Response  Palpable increased muscle length    Rhomboids Response  Palpable increased muscle length    Longissimus Response  Palpable increased muscle length Rt cervical                 PT Long Term Goals - 08/14/17 1503  PT LONG TERM GOAL #1   Title  Improve posture and alignment with patient to demonstrate improve upright postue with posterior shoulder girdle engaged. 08/25/17    Time  6    Period  Weeks    Status  On-going      PT LONG TERM GOAL #2   Title  Decrease pain Rt upper quarter by 50-75% allowing patient to participate in normal functional activities with less pain and discomfort 08/25/17    Time  6    Period  Weeks    Status  Partially Met      PT LONG TERM GOAL #3   Title  Increase cervical and shoulder ROM to painful WNL's 08/25/17    Time  6    Period  Weeks    Status  Partially Met      PT LONG TERM GOAL #4   Title  Independent in HEP 08/25/17    Time  6    Period  Weeks    Status  On-going      PT LONG TERM GOAL #5   Title  Improve to maintain FOTO at current level 08/25/17    Time  6    Period  Weeks    Status  On-going            Plan - 08/14/17 1531    Clinical Impression Statement  Pt running late for appt - continues to have muscular tightness through Rt upper trap/lateral cervical musculature. Responds well to DN and manual work.      Rehab Potential  Good    PT Frequency  2x / week    PT Duration  6 weeks    PT Treatment/Interventions  Patient/family education;ADLs/Self Care Home Management;Cryotherapy;Electrical Stimulation;Iontophoresis 3m/ml Dexamethasone;Moist Heat;Ultrasound;Dry needling;Manual techniques;Neuromuscular re-education;Therapeutic activities;Therapeutic exercise    PT Next Visit Plan  manual therapy to Rt upper quadrant; continue DN    Consulted and Agree with Plan of Care  Patient       Patient will benefit from skilled therapeutic intervention in order to improve the following deficits and impairments:  Postural dysfunction, Improper body mechanics, Pain, Increased fascial restricitons, Increased muscle spasms, Decreased range of motion, Decreased activity tolerance  Visit Diagnosis: Acute pain of right shoulder  Abnormal posture  Other symptoms and signs involving the musculoskeletal system     Problem List Patient Active Problem List   Diagnosis Date Noted  . Right hand pain 06/24/2017  . Impingement syndrome of shoulder, right 06/24/2017  . Dupuytren's contracture of right hand 06/24/2017  . DIABETES MELLITUS, TYPE II 02/16/2010  . HYPERLIPIDEMIA 02/16/2010  . DEPRESSION 02/16/2010  . HYPERTENSION 02/16/2010  . GERD 02/16/2010  . KNEE PAIN, LEFT, ACUTE 02/16/2010    Deke Tilghman PNilda SimmerPT, MPH  08/14/2017, 3:34 PM  [facility] [address]Phone: [phone]  Fax:  [phone] Name: JShelbe HaglundMRN: [id number] of Birth: [birth year]

## 2017-08-18 ENCOUNTER — Ambulatory Visit (INDEPENDENT_AMBULATORY_CARE_PROVIDER_SITE_OTHER): Payer: BLUE CROSS/BLUE SHIELD | Admitting: Rehabilitative and Restorative Service Providers"

## 2017-08-18 ENCOUNTER — Encounter: Payer: Self-pay | Admitting: Rehabilitative and Restorative Service Providers"

## 2017-08-18 DIAGNOSIS — M25511 Pain in right shoulder: Secondary | ICD-10-CM | POA: Diagnosis not present

## 2017-08-18 DIAGNOSIS — R293 Abnormal posture: Secondary | ICD-10-CM | POA: Diagnosis not present

## 2017-08-18 DIAGNOSIS — R29898 Other symptoms and signs involving the musculoskeletal system: Secondary | ICD-10-CM | POA: Diagnosis not present

## 2017-08-18 NOTE — Therapy (Signed)
[place], [name], [address] Phone: [phone]   Fax:  [phone]  Physical Therapy Treatment  Patient Details  Name: Tonya Chandler MRN: [id number] Date of Birth: 05/06/1960 Referring Provider: Dr Dianah Field   Encounter Date: 08/18/2017  PT End of Session - 08/18/17 1148    Visit Number  9    Number of Visits  12    Date for PT Re-Evaluation  08/25/17    PT Start Time  1146    PT Stop Time  1245    PT Time Calculation (min)  59 min    Activity Tolerance  Patient tolerated treatment well       Past Medical History:  Diagnosis Date  . Anxiety   . Depression   . Diabetes mellitus   . Gastro-esophageal reflux   . Hyperlipidemia   . Hypertension     Past Surgical History:  Procedure Laterality Date  . ABDOMINAL HYSTERECTOMY    . CARPAL TUNNEL RELEASE Right 10/2016  . PIP JOINT FUSION     pinky    There were no vitals filed for this visit.  Subjective Assessment - 08/18/17 1149    Subjective  Shoulder is feeling better than it did last week. She has less tightness and pain. The DN continues to help with the tightness.     Currently in Pain?  Yes    Pain Score  2     Pain Location  Shoulder    Pain Orientation  Right    Pain Descriptors / Indicators  Dull;Tightness    Pain Type  Acute pain    Pain Onset  More than a month ago    Pain Frequency  Intermittent         [facility] PT Assessment - 08/18/17 0001      Assessment   Medical Diagnosis  Rt shoulder pain     Referring Provider  Dr Dianah Field    Onset Date/Surgical Date  04/21/17    Hand Dominance  Left    Next MD Visit  08/25/17      AROM   Cervical Flexion  46    Cervical Extension  53    Cervical - Right Side Bend  42    Cervical - Left Side Bend  38    Cervical - Right Rotation  64    Cervical - Left Rotation  70      Palpation   Palpation comment  muscular tightness Rt > Lt ant/lat/posterior cervical musculature;  pecs; upper trap; leveator; periscapular musculature; teres                    [facility] Adult PT Treatment/Exercise - 08/18/17 0001      Shoulder Exercises: Prone   Other Prone Exercises  prone series 5 reps each       Shoulder Exercises: ROM/Strengthening   UBE (Upper Arm Bike)  L3: 1 min fwd/1 min back total 4 min alt       Shoulder Exercises: Stretch   Other Shoulder Stretches  3 way doorway stretch 30 sec x 3 reps     Other Shoulder Stretches  reach for floor upper trap stretch 10 sec x 4; lateral cervical flexion 10 sec x 3       Moist Heat Therapy   Number Minutes Moist Heat  20 Minutes    Moist Heat Location  Cervical;Shoulder      Electrical Stimulation   Electrical Stimulation Location  Rt cervical and shoulder area    Electrical Stimulation Action  IFC    Electrical Stimulation Parameters  to tolerance    Electrical Stimulation Goals  Pain;Tone      Manual Therapy   Manual therapy comments  pt sitting     Soft tissue mobilization  deep tissue work through cervical and thoracic paraspinals; upper trap; leveator Rt > Lt     Myofascial Release  cervical to upper trap       Neck Exercises: Stretches   Neck Stretch  3 reps;10 seconds lateral cervical flexion        Trigger Point Dry Needling - 08/18/17 1219    Consent Given?  Yes    Muscles Treated Upper Body  -- Rt with estim     Upper Trapezius Response  Palpable increased muscle length    Levator Scapulae Response  Palpable increased muscle length    Rhomboids Response  Palpable increased muscle length    Longissimus Response  Palpable increased muscle length Rt cervical            PT Education - 08/18/17 1207    Education Details  HEP     Person(s) Educated  Patient    Methods  Explanation;Demonstration;Tactile cues;Verbal cues;Handout    Comprehension  Verbalized understanding;Returned demonstration;Verbal cues required;Tactile cues required          PT Long Term Goals - 08/18/17 1150       PT LONG TERM GOAL #1   Title  Improve posture and alignment with patient to demonstrate improve upright postue with posterior shoulder girdle engaged. 08/25/17    Time  6    Period  Weeks    Status  On-going      PT LONG TERM GOAL #2   Title  Decrease pain Rt upper quarter by 50-75% allowing patient to participate in normal functional activities with less pain and discomfort 08/25/17    Time  6    Period  Weeks    Status  Partially Met      PT LONG TERM GOAL #3   Title  Increase cervical and shoulder ROM to painful WNL's 08/25/17    Time  6    Period  Weeks    Status  Partially Met      PT LONG TERM GOAL #4   Title  Independent in HEP 08/25/17    Time  6    Period  Weeks    Status  On-going      PT LONG TERM GOAL #5   Title  Improve to maintain FOTO at current level 08/25/17    Time  6    Period  Weeks    Status  On-going            Plan - 08/18/17 1151    Clinical Impression Statement  Continued gradula improvement. Fighting posture and activities of work and daily living to create lasting changes in the neck and shoulders. Patient has some improvement in posture and alignment; continued muscular tightness through the Rt > Lt upper trap and cervical musculature. Good response to Dn and manual work    Rehab Potential  Good    PT Frequency  2x / week    PT Duration  6 weeks    PT Treatment/Interventions  Patient/family education;ADLs/Self Care Home Management;Cryotherapy;Electrical Stimulation;Iontophoresis 41m/ml Dexamethasone;Moist Heat;Ultrasound;Dry needling;Manual techniques;Neuromuscular re-education;Therapeutic activities;Therapeutic exercise    PT Next Visit Plan  manual therapy to Rt upper quadrant; continue DN    Consulted  and Agree with Plan of Care  Patient       Patient will benefit from skilled therapeutic intervention in order to improve the following deficits and impairments:  Postural dysfunction, Improper body mechanics, Pain, Increased fascial restricitons,  Increased muscle spasms, Decreased range of motion, Decreased activity tolerance  Visit Diagnosis: Acute pain of right shoulder  Abnormal posture  Other symptoms and signs involving the musculoskeletal system     Problem List Patient Active Problem List   Diagnosis Date Noted  . Right hand pain 06/24/2017  . Impingement syndrome of shoulder, right 06/24/2017  . Dupuytren's contracture of right hand 06/24/2017  . DIABETES MELLITUS, TYPE II 02/16/2010  . HYPERLIPIDEMIA 02/16/2010  . DEPRESSION 02/16/2010  . HYPERTENSION 02/16/2010  . GERD 02/16/2010  . KNEE PAIN, LEFT, ACUTE 02/16/2010    Kemaria Dedic Nilda Simmer PT, MPH  08/18/2017, 12:30 PM  [facility] [place], [name], [address] Phone: [phone]   Fax:  [phone]  Name: Tonya Chandler MRN: [id number] Date of Birth: [birth year]

## 2017-08-18 NOTE — Patient Instructions (Addendum)
Side Bend, Sitting Keep chin tucked     Sit, head in comfortable, centered position, chin slightly tucked. Gently tilt head, bringing ear toward same-side shoulder. Hold _10__ seconds.  Repeat _2-3__ times per session. Do _2-3__ sessions per day.   Side Bend, Sitting Keep chin tucked     Sit, hand over top of head. Gently pull head to one side. Hold _10__ seconds. Repeat _2-3__ times per session. Do _2-3__ sessions per day.  Shoulder Blade Squeeze: Arms at Sides    Arms at sides, parallel, elbows straight, palms up. Press pelvis down. Squeeze backbone with shoulder blades, raising front of shoulders, chest, and arms. Keep head and neck neutral. Hold _3-5__ seconds. Relax. Repeat _5-10__ times.   \ Shoulder Blade Squeeze: W    Arms out to sides at 90 palms down. Bend elbows to 90. Press pelvis down. Squeeze backbone with shoulder blades. Raise arms, front of shoulders, chest, and head. Keep neck neutral. Hold __2-3_ seconds. Relax. Repeat _5-10__ times.  Shoulder Blade Squeeze: Airplane    Arms out to sides at 90, elbows straight, palms down. Press pelvis down. Squeeze backbone with shoulder blades. Raise arms, front of shoulders, chest, and head. Keep neck neutral. Hold __2-3_ seconds. Relax. Repeat __5-10_ times.  \ Shoulder Blade Squeeze: Superperson    Arms alongside head, elbows straight, palms down. Press pelvis down. Squeeze backbone with shoulder blades. Raise arms, chest, and head. Keep neck neutral. Hold _2-3__ seconds. Relax. Repeat __5-10_ times.

## 2017-08-21 ENCOUNTER — Ambulatory Visit (INDEPENDENT_AMBULATORY_CARE_PROVIDER_SITE_OTHER): Payer: BLUE CROSS/BLUE SHIELD | Admitting: Physical Therapy

## 2017-08-21 DIAGNOSIS — M25511 Pain in right shoulder: Secondary | ICD-10-CM

## 2017-08-21 DIAGNOSIS — R293 Abnormal posture: Secondary | ICD-10-CM

## 2017-08-21 DIAGNOSIS — R29898 Other symptoms and signs involving the musculoskeletal system: Secondary | ICD-10-CM | POA: Diagnosis not present

## 2017-08-21 NOTE — Therapy (Signed)
[place], [name], [address] Phone: [phone]   Fax:  [phone]  Physical Therapy Treatment  Patient Details  Name: Tonya Chandler MRN: [id number] Date of Birth: 29-Oct-1960 Referring Provider: Dr. Dianah Field   Encounter Date: 08/21/2017  PT End of Session - 08/21/17 1702    Visit Number  10    Number of Visits  12    Date for PT Re-Evaluation  08/25/17    PT Start Time  3244    PT Stop Time  1700    PT Time Calculation (min)  43 min    Activity Tolerance  Patient tolerated treatment well    Behavior During Therapy  [facility] for tasks assessed/performed       Past Medical History:  Diagnosis Date  . Anxiety   . Depression   . Diabetes mellitus   . Gastro-esophageal reflux   . Hyperlipidemia   . Hypertension     Past Surgical History:  Procedure Laterality Date  . ABDOMINAL HYSTERECTOMY    . CARPAL TUNNEL RELEASE Right 10/2016  . PIP JOINT FUSION     pinky    There were no vitals filed for this visit.  Subjective Assessment - 08/21/17 1625    Subjective  Pt reports she has had a couple stressful days with a lot of driving. Neck has been a little tighter.  She can now sleep on either shoulder.     Currently in Pain?  Yes    Pain Score  4     Pain Location  Shoulder (upper trap area)    Pain Orientation  Right    Pain Descriptors / Indicators  Dull;Tightness    Aggravating Factors   driving    Pain Relieving Factors  TENS, heat         [facility] PT Assessment - 08/21/17 0001      Assessment   Medical Diagnosis  Rt shoulder pain     Referring Provider  Dr. Dianah Field    Onset Date/Surgical Date  04/21/17    Hand Dominance  Left    Next MD Visit  08/25/17      AROM   Overall AROM Comments  measurements taken at previous visit     Cervical Flexion  46    Cervical Extension  53    Cervical - Right Side Bend  42    Cervical - Left Side Bend  38    Cervical - Right Rotation  64    Cervical - Left Rotation  70       [facility] Adult PT Treatment/Exercise - 08/21/17 0001      Shoulder Exercises: Prone   Other Prone Exercises  Prone series: axial ext, scap retraction, and --arms in ext, abdct, goal post, then superman x 5 of each .      Shoulder Exercises: Standing   Other Standing Exercises  D2 flexion with yellow band and mirror for feedback x 8 reps each arm.       Shoulder Exercises: ROM/Strengthening   UBE (Upper Arm Bike)  L3: 2 min fwd/1 min back       Shoulder Exercises: Stretch   Other Shoulder Stretches  3 way doorway stretch 30 sec x 3 reps       Modalities   Modalities  Ultrasound;Teacher, English as a foreign language Location  Rt upper trap and cervical paraspinals     Chartered certified accountant  combo [place]  Electrical Stimulation Parameters  to tolerance    Electrical Stimulation Goals  Pain;Tone      Ultrasound   Ultrasound Location  see estim     Ultrasound Parameters  100%, 1.2 w/cm2, 8 min, 1.0 mHz.      Ultrasound Goals  Pain tightness      Manual Therapy   Manual therapy comments  pt sitting     Soft tissue mobilization  IASTM with Edge tool to Rt upper trap and Rt cervical paraspinals to decrease fascial restriction and improve ROM.       Neck Exercises: Stretches   Levator Stretch  Right;Left;3 reps;20 seconds    Neck Stretch  3 reps;10 seconds lateral cervical flexion                   PT Long Term Goals - 08/18/17 1150      PT LONG TERM GOAL #1   Title  Improve posture and alignment with patient to demonstrate improve upright postue with posterior shoulder girdle engaged. 08/25/17    Time  6    Period  Weeks    Status  On-going      PT LONG TERM GOAL #2   Title  Decrease pain Rt upper quarter by 50-75% allowing patient to participate in normal functional activities with less pain and discomfort 08/25/17    Time  6    Period  Weeks    Status  Partially Met      PT LONG TERM  GOAL #3   Title  Increase cervical and shoulder ROM to painful WNL's 08/25/17    Time  6    Period  Weeks    Status  Partially Met      PT LONG TERM GOAL #4   Title  Independent in HEP 08/25/17    Time  6    Period  Weeks    Status  On-going      PT LONG TERM GOAL #5   Title  Improve to maintain FOTO at current level 08/25/17    Time  6    Period  Weeks    Status  On-going            Plan - 08/21/17 1801    Clinical Impression Statement  Pt has had an improvement in cervical ROM since initiating therapy. She reports she is able to sleep on her sides again.  She has had a good response with manual therapy (including IASTM today) and DN.  Pt is making good gains towards remaining goals.     Rehab Potential  Good    PT Frequency  2x / week    PT Duration  6 weeks    PT Treatment/Interventions  Patient/family education;ADLs/Self Care Home Management;Cryotherapy;Electrical Stimulation;Iontophoresis 45m/ml Dexamethasone;Moist Heat;Ultrasound;Dry needling;Manual techniques;Neuromuscular re-education;Therapeutic activities;Therapeutic exercise    PT Next Visit Plan  assess response to combo [place] IASTM.  FOTO! end of POC.     Consulted and Agree with Plan of Care  Patient       Patient will benefit from skilled therapeutic intervention in order to improve the following deficits and impairments:  Postural dysfunction, Improper body mechanics, Pain, Increased fascial restricitons, Increased muscle spasms, Decreased range of motion, Decreased activity tolerance  Visit Diagnosis: Acute pain of right shoulder  Abnormal posture  Other symptoms and signs involving the musculoskeletal system     Problem List Patient Active Problem List   Diagnosis Date Noted  . Right hand pain 06/24/2017  .  Impingement syndrome of shoulder, right 06/24/2017  . Dupuytren's contracture of right hand 06/24/2017  . DIABETES MELLITUS, TYPE II 02/16/2010  . HYPERLIPIDEMIA 02/16/2010  . DEPRESSION  02/16/2010  . HYPERTENSION 02/16/2010  . GERD 02/16/2010  . KNEE PAIN, LEFT, ACUTE 02/16/2010   Kerin Perna, PTA 08/21/17 6:06 PM  [place], [name], [address] Phone: [phone]   Fax:  [phone]  Name: Ragna Kramlich MRN: [id number] Date of Birth: 1960-10-12

## 2017-08-25 ENCOUNTER — Encounter: Payer: Self-pay | Admitting: Sports Medicine

## 2017-08-25 ENCOUNTER — Ambulatory Visit (INDEPENDENT_AMBULATORY_CARE_PROVIDER_SITE_OTHER): Payer: BLUE CROSS/BLUE SHIELD | Admitting: Sports Medicine

## 2017-08-25 DIAGNOSIS — M79641 Pain in right hand: Secondary | ICD-10-CM

## 2017-08-25 DIAGNOSIS — M7541 Impingement syndrome of right shoulder: Secondary | ICD-10-CM

## 2017-08-25 NOTE — Assessment & Plan Note (Signed)
Continues to improve, nearly pain free, return as needed for this.

## 2017-08-25 NOTE — Assessment & Plan Note (Signed)
Right fourth flexor sheath is doing very well. She did have some pain over a palpable digital nerve of the volar aspect of her thumb, I did a hydrodissection of the proper digital nerve as well as an injection into the flexor pollicis longus tendon sheath at the last visit and she returns today completely pain-free.

## 2017-08-25 NOTE — Progress Notes (Signed)
Subjective:    CC: Follow-up  HPI: Right hand pain: Completely resolved after Hydro dissection of right thumb proper digital nerve as well as injection of the FPL.  Right shoulder pain: Almost completely resolved with physical therapy.  Continues to improve.  I reviewed the past medical history, family history, social history, surgical history, and allergies today and no changes were needed.  Please see the problem list section below in epic for further details.  Past Medical History: Past Medical History:  Diagnosis Date  . Anxiety   . Depression   . Diabetes mellitus   . Gastro-esophageal reflux   . Hyperlipidemia   . Hypertension    Past Surgical History: Past Surgical History:  Procedure Laterality Date  . ABDOMINAL HYSTERECTOMY    . CARPAL TUNNEL RELEASE Right 10/2016  . PIP JOINT FUSION     pinky   Social History: Social History   Socioeconomic History  . Marital status: Married    Spouse name: Not on file  . Number of children: Not on file  . Years of education: Not on file  . Highest education level: Not on file  Occupational History  . Not on file  Social Needs  . Financial resource strain: Not on file  . Food insecurity:    Worry: Not on file    Inability: Not on file  . Transportation needs:    Medical: Not on file    Non-medical: Not on file  Tobacco Use  . Smoking status: Never Smoker  . Smokeless tobacco: Never Used  Substance and Sexual Activity  . Alcohol use: No  . Drug use: No  . Sexual activity: Yes  Lifestyle  . Physical activity:    Days per week: Not on file    Minutes per session: Not on file  . Stress: Not on file  Relationships  . Social connections:    Talks on phone: Not on file    Gets together: Not on file    Attends religious service: Not on file    Active member of club or organization: Not on file    Attends meetings of clubs or organizations: Not on file    Relationship status: Not on file  Other Topics Concern  .  Not on file  Social History Narrative  . Not on file   Family History: Family History  Problem Relation Age of Onset  . Diabetes Mother   . Dementia Mother   . Heart attack Father    Allergies: Allergies  Allergen Reactions  . Ampicillin   . Cefprozil   . Meloxicam    Medications: See med rec.  Review of Systems: No fevers, chills, night sweats, weight loss, chest pain, or shortness of breath.   Objective:    General: Well Developed, well nourished, and in no acute distress.  Neuro: Alert and oriented x3, extra-ocular muscles intact, sensation grossly intact.  HEENT: Normocephalic, atraumatic, pupils equal round reactive to light, neck supple, no masses, no lymphadenopathy, thyroid nonpalpable.  Skin: Warm and dry, no rashes. Cardiac: Regular rate and rhythm, no murmurs rubs or gallops, no lower extremity edema.  Respiratory: Clear to auscultation bilaterally. Not using accessory muscles, speaking in full sentences.  Impression and Recommendations:    Right hand pain Right fourth flexor sheath is doing very well. She did have some pain over a palpable digital nerve of the volar aspect of her thumb, I did a hydrodissection of the proper digital nerve as well as an injection into the  flexor pollicis longus tendon sheath at the last visit and she returns today completely pain-free.  Impingement syndrome of shoulder, right Continues to improve, nearly pain free, return as needed for this. ___________________________________________ Gwen Her. Dianah Field, M.D., ABFM., CAQSM. Primary Care and [place] Instructor of [place] [facility] [place] [facility] [facility]

## 2017-08-26 ENCOUNTER — Encounter: Payer: BLUE CROSS/BLUE SHIELD | Admitting: Physical Therapy

## 2017-08-29 ENCOUNTER — Ambulatory Visit (INDEPENDENT_AMBULATORY_CARE_PROVIDER_SITE_OTHER): Payer: BLUE CROSS/BLUE SHIELD | Admitting: Physical Therapy

## 2017-08-29 DIAGNOSIS — R293 Abnormal posture: Secondary | ICD-10-CM | POA: Diagnosis not present

## 2017-08-29 DIAGNOSIS — M25511 Pain in right shoulder: Secondary | ICD-10-CM

## 2017-08-29 DIAGNOSIS — R29898 Other symptoms and signs involving the musculoskeletal system: Secondary | ICD-10-CM

## 2017-08-29 NOTE — Therapy (Signed)
[place], [name], [address] Phone: [phone]   Fax:  [phone]  Physical Therapy Treatment  Patient Details  Name: Tonya Chandler MRN: [id number] Date of Birth: 31-Jan-1960 Referring Provider: Dr. Dianah Field   Encounter Date: 08/29/2017  PT End of Session - 08/29/17 0808    Visit Number  11    PT Start Time  0804    PT Stop Time  0900    PT Time Calculation (min)  56 min    Activity Tolerance  Patient tolerated treatment well    Behavior During Therapy  [facility] for tasks assessed/performed       Past Medical History:  Diagnosis Date  . Anxiety   . Depression   . Diabetes mellitus   . Gastro-esophageal reflux   . Hyperlipidemia   . Hypertension     Past Surgical History:  Procedure Laterality Date  . ABDOMINAL HYSTERECTOMY    . CARPAL TUNNEL RELEASE Right 10/2016  . PIP JOINT FUSION     pinky    There were no vitals filed for this visit.  Subjective Assessment - 08/29/17 0808    Subjective  Pt reports she is very stiff today.  She had a busy day yesterday and didn't get a chance to do any exercises.  "I went to doctor last Monday and told him I was doing well, so he released me". Pt reports a flare up of symptoms since then.     Currently in Pain?  Yes    Pain Score  6     Pain Location  Shoulder   upper trap area   Pain Orientation  Right    Pain Descriptors / Indicators  Dull    Aggravating Factors   driving    Pain Relieving Factors  TENS, heat          [facility] PT Assessment - 08/29/17 0001      Assessment   Medical Diagnosis  Rt shoulder pain     Referring Provider  Dr. Dianah Field    Onset Date/Surgical Date  04/21/17    Hand Dominance  Left    Next MD Visit  PRN      Observation/Other Assessments   Focus on Therapeutic Outcomes (FOTO)   37% limited (intake 27% limited)      AROM   Cervical Flexion  41   57 after IASTM   Cervical Extension  32   40 after IASTM   Cervical - Right Side Bend  31   41 after IASTM   Cervical - Left Side Bend  30   37 after IASTM   Cervical - Right Rotation  46   49 after IASTM   Cervical - Left Rotation  68   70 after IASTM       [facility] Adult PT Treatment/Exercise - 08/29/17 0001      Shoulder Exercises: Supine   Other Supine Exercises  overhead pull with yellow band x 5 reps, 2 sets; sash with yellow band x 5 each arm, horiz abdct x 10 (with red band);  bilat shoulder ER x 10 reps (yellow)      Shoulder Exercises: Standing   Other Standing Exercises  D2 flexion with yellow band and mirror for feedback x 5 reps each arm.       Shoulder Exercises: ROM/Strengthening   UBE (Upper Arm Bike)  L1: 1.5 min each       Shoulder Exercises: Stretch   Other Shoulder Stretches  3 way doorway stretch 30 sec x 3 reps       Moist Heat Therapy   Number Minutes Moist Heat  15 Minutes    Moist Heat Location  Cervical;Shoulder      Electrical Stimulation   Electrical Stimulation Location  bilat upper trap and cervical paraspinals.     Electrical Stimulation Action  IFC    Electrical Stimulation Parameters  to tolerance     Electrical Stimulation Goals  Pain;Tone      Manual Therapy   Soft tissue mobilization  IASTM with Edge tool to Rt upper trap and Rt cervical paraspinals to decrease fascial restriction and improve ROM.                   PT Long Term Goals - 08/29/17 0914      PT LONG TERM GOAL #1   Title  Improve posture and alignment with patient to demonstrate improve upright postue with posterior shoulder girdle engaged. 08/25/17    Time  6    Period  Weeks    Status  On-going      PT LONG TERM GOAL #2   Title  Decrease pain Rt upper quarter by 50-75% allowing patient to participate in normal functional activities with less pain and discomfort 08/25/17    Time  6    Period  Weeks    Status  Partially Met   dependent on activity and driving schedule     PT LONG TERM GOAL #3   Title  Increase  cervical and shoulder ROM to painful WNL's 08/25/17    Time  6    Period  Weeks    Status  Partially Met   shoulder met     PT LONG TERM GOAL #4   Title  Independent in HEP 08/25/17    Time  6    Period  Weeks    Status  On-going      PT LONG TERM GOAL #5   Title  Improve to maintain FOTO at current level 08/25/17    Time  6    Period  Weeks    Status  On-going            Plan - 08/29/17 0852    Clinical Impression Statement  Pt has had a flare up of symptoms in Rt neck/shoulder since last visit a week ago.  Pt reported decrease in stiffness with exercise and further reduction after IASTM/estim. Cervical ROM improved after IASTM.  Pt's decrease in FOTO score likely represents flare up of symptoms over last 1.5 days. Encouraged pt to be more compliant with HEP daily to prevent increase in symptoms; pt verbalized understanding.  She continues to make gradual gains towards goals and will benefit from continued PT intervention to max functional mobility with less pain.      Rehab Potential  Good    PT Frequency  2x / week    PT Duration  6 weeks    PT Treatment/Interventions  Patient/family education;ADLs/Self Care Home Management;Cryotherapy;Electrical Stimulation;Iontophoresis 4mg/ml Dexamethasone;Moist Heat;Ultrasound;Dry needling;Manual techniques;Neuromuscular re-education;Therapeutic activities;Therapeutic exercise    PT Next Visit Plan  spoke to supervising PT regarding pt's progress and goals.  Will request additional visits from MD.        Patient will benefit from skilled therapeutic intervention in order to improve the following deficits and impairments:  Postural dysfunction, Improper body mechanics, Pain, Increased fascial restricitons, Increased muscle spasms, Decreased range of motion, Decreased activity tolerance  Visit Diagnosis: Acute   pain of right shoulder  Abnormal posture  Other symptoms and signs involving the musculoskeletal system     Problem List Patient  Active Problem List   Diagnosis Date Noted  . Right hand pain 06/24/2017  . Impingement syndrome of shoulder, right 06/24/2017  . Dupuytren's contracture of right hand 06/24/2017  . DIABETES MELLITUS, TYPE II 02/16/2010  . HYPERLIPIDEMIA 02/16/2010  . DEPRESSION 02/16/2010  . HYPERTENSION 02/16/2010  . GERD 02/16/2010  . KNEE PAIN, LEFT, ACUTE 02/16/2010   Jennifer Carlson-Long, PTA 08/29/17 9:26 AM  Celyn P. Holt PT, MPH 08/29/17 10:28 AM   [facility] [address] [place], [place], [address] Phone: [phone]   Fax:  [phone]  Name: Tonya Chandler MRN: [id number] Date of Birth: 07/24/1960   

## 2017-09-01 ENCOUNTER — Ambulatory Visit (INDEPENDENT_AMBULATORY_CARE_PROVIDER_SITE_OTHER): Payer: BLUE CROSS/BLUE SHIELD | Admitting: Physical Therapy

## 2017-09-01 DIAGNOSIS — R29898 Other symptoms and signs involving the musculoskeletal system: Secondary | ICD-10-CM

## 2017-09-01 DIAGNOSIS — R293 Abnormal posture: Secondary | ICD-10-CM

## 2017-09-01 DIAGNOSIS — M25511 Pain in right shoulder: Secondary | ICD-10-CM

## 2017-09-01 NOTE — Therapy (Signed)
[place], [name], [address] Phone: [phone]   Fax:  [phone]  Physical Therapy Treatment  Patient Details  Name: Tonya Chandler MRN: [id number] Date of Birth: [birth year] Referring Provider: Dr. Dianah Field   Encounter Date: 09/01/2017  PT End of Session - 09/01/17 0847    Visit Number  12    Number of Visits  24    Date for PT Re-Evaluation  10/10/17    PT Start Time  0805    PT Stop Time  0902    PT Time Calculation (min)  57 min    Activity Tolerance  Patient tolerated treatment well    Behavior During Therapy  [facility] for tasks assessed/performed       Past Medical History:  Diagnosis Date  . Anxiety   . Depression   . Diabetes mellitus   . Gastro-esophageal reflux   . Hyperlipidemia   . Hypertension     Past Surgical History:  Procedure Laterality Date  . ABDOMINAL HYSTERECTOMY    . CARPAL TUNNEL RELEASE Right 10/2016  . PIP JOINT FUSION     pinky    There were no vitals filed for this visit.  Subjective Assessment - 09/01/17 0810    Subjective  Pt reports she is sore from last treatment, but not as stiff. She has been stretching since last visit and continues to try to adjust her arm position while driving.     Patient Stated Goals  get rid of shoulder pain     Currently in Pain?  Yes    Pain Score  2     Pain Location  Shoulder   upper trap area   Pain Orientation  Right   slightly on Lt   Pain Descriptors / Indicators  Sharp    Aggravating Factors   driving    Pain Relieving Factors  TENS, heat         [facility] PT Assessment - 09/01/17 0001      Assessment   Medical Diagnosis  Rt shoulder pain     Referring Provider  Dr. Dianah Field    Onset Date/Surgical Date  04/21/17    Hand Dominance  Left    Next MD Visit  PRN        [facility] Adult PT Treatment/Exercise - 09/01/17 0001      Shoulder Exercises: Prone   Other Prone Exercises  goal posts with scap retraction  x 10 reps (held prone flexion due to increased pain last time performed)      Shoulder Exercises: Standing   Other Standing Exercises  step back single arm row with red band x 10 each side.   D2 flexion (sash) wiht yellow band x 5 each side (tactile cues to correct form);  bilat shoulder ER with yellowband x 10, back against noodle- 2 sts      Shoulder Exercises: ROM/Strengthening   UBE (Upper Arm Bike)  L2: 1 min each direction       Shoulder Exercises: Stretch   Other Shoulder Stretches  3 way doorway stretch 30 sec x 3 reps       Moist Heat Therapy   Number Minutes Moist Heat  20 Minutes    Moist Heat Location  Cervical;Shoulder      Electrical Stimulation   Electrical Stimulation Location  bilat upper trap and cervical paraspinals.     Electrical Stimulation Action  IFC    Electrical Stimulation Parameters  to tolerance  Electrical Stimulation Goals  Tone;Pain      Manual Therapy   Soft tissue mobilization  IASTM with Edge tool to Rt/Lt upper trap and Rt cervical paraspinals to decrease fascial restriction and improve ROM; STM to same area; TPR to Rt paraspinals with active Rt rotation, Rt lateral bending, and flexion.       Myofascial Release  suboccipital release with pt in prone                  PT Long Term Goals - 08/29/17 0914      PT LONG TERM GOAL #1   Title  Improve posture and alignment with patient to demonstrate improve upright postue with posterior shoulder girdle engaged. 10/10/17    Time  12    Period  Weeks    Status  Revised      PT LONG TERM GOAL #2   Title  Decrease pain Rt upper quarter by 50-75% allowing patient to participate in normal functional activities with less pain and discomfort 10/10/17    Time  12    Period  Weeks    Status  Revised   dependent on activity and driving schedule     PT LONG TERM GOAL #3   Title  Increase cervical and shoulder ROM to painful WNL's 10/10/17    Time  12    Period  Weeks    Status  Revised    shoulder met     PT LONG TERM GOAL #4   Title  Independent in HEP 09/2017    Time  12    Period  Weeks    Status  Revised      PT LONG TERM GOAL #5   Title  Improve to maintain FOTO at current level 10/10/17    Time  12    Period  Weeks    Status  Revised            Plan - 09/01/17 1237    Clinical Impression Statement  Pt's pain in Rt neck has calmed some since last visit.  All exercises tolerated well, without increase in symptoms.  Pt has palpable tightness/ trigger point in Rt cervical paraspinals around C4. will benefit from continued manual therapy to this area.       Rehab Potential  Good    PT Frequency  2x / week    PT Duration  6 weeks    PT Treatment/Interventions  Patient/family education;ADLs/Self Care Home Management;Cryotherapy;Electrical Stimulation;Iontophoresis 54m/ml Dexamethasone;Moist Heat;Ultrasound;Dry needling;Manual techniques;Neuromuscular re-education;Therapeutic activities;Therapeutic exercise    PT Next Visit Plan  continued postural strengthening and manual.    Consulted and Agree with Plan of Care  Patient       Patient will benefit from skilled therapeutic intervention in order to improve the following deficits and impairments:  Postural dysfunction, Improper body mechanics, Pain, Increased fascial restricitons, Increased muscle spasms, Decreased range of motion, Decreased activity tolerance  Visit Diagnosis: Acute pain of right shoulder  Abnormal posture  Other symptoms and signs involving the musculoskeletal system     Problem List Patient Active Problem List   Diagnosis Date Noted  . Right hand pain 06/24/2017  . Impingement syndrome of shoulder, right 06/24/2017  . Dupuytren's contracture of right hand 06/24/2017  . DIABETES MELLITUS, TYPE II 02/16/2010  . HYPERLIPIDEMIA 02/16/2010  . DEPRESSION 02/16/2010  . HYPERTENSION 02/16/2010  . GERD 02/16/2010  . KNEE PAIN, LEFT, ACUTE 02/16/2010   JKerin Perna  PTA 09/01/17 12:48 PM  [facility] [address] [place], [name], [address] Phone: [phone]   Fax:  [phone]  Name: Tonya Chandler MRN: [id number] Date of Birth: [birth year]

## 2017-09-04 ENCOUNTER — Ambulatory Visit (INDEPENDENT_AMBULATORY_CARE_PROVIDER_SITE_OTHER): Payer: BLUE CROSS/BLUE SHIELD | Admitting: Physical Therapy

## 2017-09-04 DIAGNOSIS — M25511 Pain in right shoulder: Secondary | ICD-10-CM | POA: Diagnosis not present

## 2017-09-04 DIAGNOSIS — R29898 Other symptoms and signs involving the musculoskeletal system: Secondary | ICD-10-CM | POA: Diagnosis not present

## 2017-09-04 DIAGNOSIS — R293 Abnormal posture: Secondary | ICD-10-CM | POA: Diagnosis not present

## 2017-09-04 NOTE — Therapy (Addendum)
[place], [name], [address] Phone: [phone]   Fax:  [phone]  Physical Therapy Treatment  Patient Details  Name: Tonya Chandler MRN: [id number] Date of Birth: 17-Mar-1960 Referring Provider: Dr. Dianah Field   Encounter Date: 09/04/2017  PT End of Session - 09/04/17 1545    Visit Number  13    Number of Visits  24    Date for PT Re-Evaluation  10/10/17    PT Start Time  9983   pt arrived late   PT Stop Time  1628    PT Time Calculation (min)  46 min    Activity Tolerance  Patient tolerated treatment well;No increased pain    Behavior During Therapy  [facility] for tasks assessed/performed       Past Medical History:  Diagnosis Date  . Anxiety   . Depression   . Diabetes mellitus   . Gastro-esophageal reflux   . Hyperlipidemia   . Hypertension     Past Surgical History:  Procedure Laterality Date  . ABDOMINAL HYSTERECTOMY    . CARPAL TUNNEL RELEASE Right 10/2016  . PIP JOINT FUSION     pinky    There were no vitals filed for this visit.  Subjective Assessment - 09/04/17 1545    Subjective  pt reports she is stiff in her neck in Rt, but less than last visit.  She has been stretching daily.   She reports she has had 3 occasions where her Lt leg has gone numb when driving.      Currently in Pain?  Yes    Pain Score  1     Pain Location  Neck    Pain Orientation  Right        [facility] Adult PT Treatment/Exercise - 09/04/17 0001      Shoulder Exercises: Supine   Other Supine Exercises  overhead pull with red band x 10 reps,  sash with red band x 10 each arm, horiz abdct x 10 (with red band)    Other Supine Exercises  snow angel while hooklying on pool noodle x 1 min, then 10 active snow angels.       Shoulder Exercises: Prone   Other Prone Exercises  goal posts with scap retraction x 10 reps      Shoulder Exercises: Standing   External Rotation  Both;10 reps;Theraband    Theraband  Level (Shoulder External Rotation)  Level 2 (Red)    Row  Strengthening;15 reps    Theraband Level (Shoulder Row)  Level 2 (Red)      Shoulder Exercises: Stretch   Other Shoulder Stretches  3 way doorway stretch 30 sec x 2 reps       Moist Heat Therapy   Number Minutes Moist Heat  15 Minutes    Moist Heat Location  Cervical;Shoulder      Electrical Stimulation   Electrical Stimulation Location  bilat upper trap and cervical paraspinals.     Electrical Stimulation Action  IFC    Electrical Stimulation Parameters  to tolerance    Electrical Stimulation Goals  Pain;Tone      Manual Therapy   Manual therapy comments  pt supine    Soft tissue mobilization  STM to bilat upper trap, levator, cervical paraspinals.     Myofascial Release  suboccipital release with pt in prone    Passive ROM  gentle stretch cervical spine       Neck Exercises: Stretches   Neck Stretch  10 seconds;2 reps   lateral cervical flexion, holding onto chair.                   PT Long Term Goals - 08/29/17 0914      PT LONG TERM GOAL #1   Title  Improve posture and alignment with patient to demonstrate improve upright postue with posterior shoulder girdle engaged. 10/10/17    Time  12    Period  Weeks    Status  Revised      PT LONG TERM GOAL #2   Title  Decrease pain Rt upper quarter by 50-75% allowing patient to participate in normal functional activities with less pain and discomfort 10/10/17    Time  12    Period  Weeks    Status  Revised   dependent on activity and driving schedule     PT LONG TERM GOAL #3   Title  Increase cervical and shoulder ROM to painful WNL's 10/10/17    Time  12    Period  Weeks    Status  Revised   shoulder met     PT LONG TERM GOAL #4   Title  Independent in HEP 09/2017    Time  12    Period  Weeks    Status  Revised      PT LONG TERM GOAL #5   Title  Improve to maintain FOTO at current level 10/10/17    Time  12    Period  Weeks    Status  Revised             Plan - 09/04/17 1618    Clinical Impression Statement  Pt tolerated all exercises with red band without increase in symptoms. She verbalized interest in working on her HEP and holding therapy for 2wks.  She has partially met her goals and is making good gains towards remaining goals.     Rehab Potential  Good    PT Frequency  2x / week    PT Duration  6 weeks    PT Treatment/Interventions  Patient/family education;ADLs/Self Care Home Management;Cryotherapy;Electrical Stimulation;Iontophoresis 16m/ml Dexamethasone;Moist Heat;Ultrasound;Dry needling;Manual techniques;Neuromuscular re-education;Therapeutic activities;Therapeutic exercise    PT Next Visit Plan  will hold therapy for 2 wks (until 8/9); if pt doesn't return will d/c to HEP.     Consulted and Agree with Plan of Care  Patient       Patient will benefit from skilled therapeutic intervention in order to improve the following deficits and impairments:  Postural dysfunction, Improper body mechanics, Pain, Increased fascial restricitons, Increased muscle spasms, Decreased range of motion, Decreased activity tolerance  Visit Diagnosis: Acute pain of right shoulder  Abnormal posture  Other symptoms and signs involving the musculoskeletal system     Problem List Patient Active Problem List   Diagnosis Date Noted  . Right hand pain 06/24/2017  . Impingement syndrome of shoulder, right 06/24/2017  . Dupuytren's contracture of right hand 06/24/2017  . DIABETES MELLITUS, TYPE II 02/16/2010  . HYPERLIPIDEMIA 02/16/2010  . DEPRESSION 02/16/2010  . HYPERTENSION 02/16/2010  . GERD 02/16/2010  . KNEE PAIN, LEFT, ACUTE 02/16/2010   JKerin Perna PTA 09/04/17 4:26 PM  [place] [address]Phone: [phone]  Fax:  [phone] Name: Tonya SobhMRN: [id number] of Birth: [birth year] PHYSICAL THERAPY DISCHARGE  SUMMARY  Visits from Start of Care: 13  Current functional level related to goals / functional  outcomes: See last progress note for discharge status    Remaining deficits: Unknown    Education / Equipment: HEP  Plan: Patient agrees to discharge.  Patient goals were partially met. Patient is being discharged due to being pleased with the current functional level.  ?????   Celyn P. Helene Kelp PT, MPH 10/02/17 4:32 PM

## 2017-11-13 DIAGNOSIS — E1140 Type 2 diabetes mellitus with diabetic neuropathy, unspecified: Secondary | ICD-10-CM | POA: Diagnosis not present

## 2017-11-13 DIAGNOSIS — Z794 Long term (current) use of insulin: Secondary | ICD-10-CM | POA: Diagnosis not present

## 2017-11-14 DIAGNOSIS — I10 Essential (primary) hypertension: Secondary | ICD-10-CM | POA: Diagnosis not present

## 2017-11-14 DIAGNOSIS — F3341 Major depressive disorder, recurrent, in partial remission: Secondary | ICD-10-CM | POA: Diagnosis not present

## 2017-11-14 DIAGNOSIS — E785 Hyperlipidemia, unspecified: Secondary | ICD-10-CM | POA: Diagnosis not present

## 2017-11-14 DIAGNOSIS — E1140 Type 2 diabetes mellitus with diabetic neuropathy, unspecified: Secondary | ICD-10-CM | POA: Diagnosis not present

## 2017-11-14 DIAGNOSIS — Z0000 Encounter for general adult medical examination without abnormal findings: Secondary | ICD-10-CM | POA: Diagnosis not present

## 2017-12-05 DIAGNOSIS — Z13820 Encounter for screening for osteoporosis: Secondary | ICD-10-CM | POA: Diagnosis not present

## 2017-12-05 DIAGNOSIS — Z79890 Hormone replacement therapy (postmenopausal): Secondary | ICD-10-CM | POA: Diagnosis not present

## 2017-12-05 DIAGNOSIS — Z1231 Encounter for screening mammogram for malignant neoplasm of breast: Secondary | ICD-10-CM | POA: Diagnosis not present

## 2017-12-05 LAB — HM MAMMOGRAPHY: HM Mammogram: NORMAL (ref 0–4)

## 2017-12-16 ENCOUNTER — Encounter: Payer: Self-pay | Admitting: Sports Medicine

## 2017-12-16 ENCOUNTER — Ambulatory Visit (INDEPENDENT_AMBULATORY_CARE_PROVIDER_SITE_OTHER): Payer: BLUE CROSS/BLUE SHIELD | Admitting: Sports Medicine

## 2017-12-16 DIAGNOSIS — G5601 Carpal tunnel syndrome, right upper limb: Secondary | ICD-10-CM | POA: Diagnosis not present

## 2017-12-16 HISTORY — DX: Carpal tunnel syndrome, right upper limb: G56.01

## 2017-12-16 NOTE — Assessment & Plan Note (Signed)
Hydrodissection, nighttime splinting. She did have a positive Phalen sign on exam. Return in 1 month.

## 2017-12-16 NOTE — Progress Notes (Signed)
Subjective:    I'm seeing this patient as a consultation for: Dr. Drake Leach  CC: Right hand pain  HPI: This is a pleasant 57 year old female, for the past several weeks she said increasing pain in her right hand, with paresthesias into the middle finger, occasional radiation up to the mid forearm into the elbow.  Worse with prolonged gripping motions, she does have significant nocturnal symptoms.  Moderate, persistent with radiation as above.  I reviewed the past medical history, family history, social history, surgical history, and allergies today and no changes were needed.  Please see the problem list section below in epic for further details.  Past Medical History: Past Medical History:  Diagnosis Date  . Anxiety   . Depression   . Diabetes mellitus   . Gastro-esophageal reflux   . Hyperlipidemia   . Hypertension    Past Surgical History: Past Surgical History:  Procedure Laterality Date  . ABDOMINAL HYSTERECTOMY    . PIP JOINT FUSION     pinky   Social History: Social History   Socioeconomic History  . Marital status: Married    Spouse name: Not on file  . Number of children: Not on file  . Years of education: Not on file  . Highest education level: Not on file  Occupational History  . Not on file  Social Needs  . Financial resource strain: Not on file  . Food insecurity:    Worry: Not on file    Inability: Not on file  . Transportation needs:    Medical: Not on file    Non-medical: Not on file  Tobacco Use  . Smoking status: Never Smoker  . Smokeless tobacco: Never Used  Substance and Sexual Activity  . Alcohol use: No  . Drug use: No  . Sexual activity: Yes  Lifestyle  . Physical activity:    Days per week: Not on file    Minutes per session: Not on file  . Stress: Not on file  Relationships  . Social connections:    Talks on phone: Not on file    Gets together: Not on file    Attends religious service: Not on file    Active member of  club or organization: Not on file    Attends meetings of clubs or organizations: Not on file    Relationship status: Not on file  Other Topics Concern  . Not on file  Social History Narrative  . Not on file   Family History: Family History  Problem Relation Age of Onset  . Diabetes Mother   . Dementia Mother   . Heart attack Father    Allergies: Allergies  Allergen Reactions  . Ampicillin   . Cefprozil   . Meloxicam    Medications: See med rec.  Review of Systems: No headache, visual changes, nausea, vomiting, diarrhea, constipation, dizziness, abdominal pain, skin rash, fevers, chills, night sweats, weight loss, swollen lymph nodes, body aches, joint swelling, muscle aches, chest pain, shortness of breath, mood changes, visual or auditory hallucinations.   Objective:   General: Well Developed, well nourished, and in no acute distress.  Neuro:  Extra-ocular muscles intact, able to move all 4 extremities, sensation grossly intact.  Deep tendon reflexes tested were normal. Psych: Alert and oriented, mood congruent with affect. ENT:  Ears and nose appear unremarkable.  Hearing grossly normal. Neck: Unremarkable overall appearance, trachea midline.  No visible thyroid enlargement. Eyes: Conjunctivae and lids appear unremarkable.  Pupils equal and round. Skin:  Warm and dry, no rashes noted.  Cardiovascular: Pulses palpable, no extremity edema. Right wrist: Inspection normal with no visible erythema or swelling. ROM smooth and normal with good flexion and extension and ulnar/radial deviation that is symmetrical with opposite wrist. Palpation is normal over metacarpals, navicular, lunate, and TFCC; tendons without tenderness/ swelling No snuffbox tenderness. No tenderness over Canal of [place]. Strength 5/5 in all directions without pain. Negative Tinel sign, positive Phalen sign. Negative Finkelstein sign. Negative Watson's test.  Procedure: Real-time Ultrasound Guided  hydrodissection of the right median nerve at the carpal tunnel Device: GE Logiq E  Verbal informed consent obtained.  Time-out conducted.  Noted no overlying erythema, induration, or other signs of local infection.  Skin prepped in a sterile fashion.  Local anesthesia: Topical Ethyl chloride.  With sterile technique and under real time ultrasound guidance: Using a 25-gauge needle advanced into the carpal tunnel, taking care to avoid intraneural injection I injected medication both superficial to and deep to the median nerve freeing it from surrounding structures, I then redirected the needle deep and injected further medication around the flexor tendons deep within the carpal tunnel for a total of 1 cc kenalog 40, 5 cc lidocaine. Completed without difficulty  Advised to call if fevers/chills, erythema, induration, drainage, or persistent bleeding.  Images permanently stored and available for review in the ultrasound unit.  Impression: Technically successful ultrasound guided median nerve hydrodissection.  Impression and Recommendations:   This case required medical decision making of moderate complexity.  Carpal tunnel syndrome, right Hydrodissection, nighttime splinting. She did have a positive Phalen sign on exam. Return in 1 month. ___________________________________________ Gwen Her. Dianah Field, M.D., ABFM., CAQSM. Primary Care and Sports Medicine [facility]  Adjunct Professor of [employer]

## 2018-01-15 ENCOUNTER — Ambulatory Visit: Payer: Self-pay | Admitting: Sports Medicine

## 2018-01-20 ENCOUNTER — Encounter: Payer: Self-pay | Admitting: Sports Medicine

## 2018-01-20 ENCOUNTER — Ambulatory Visit (INDEPENDENT_AMBULATORY_CARE_PROVIDER_SITE_OTHER): Payer: BLUE CROSS/BLUE SHIELD | Admitting: Sports Medicine

## 2018-01-20 DIAGNOSIS — G5601 Carpal tunnel syndrome, right upper limb: Secondary | ICD-10-CM | POA: Diagnosis not present

## 2018-01-20 DIAGNOSIS — M5412 Radiculopathy, cervical region: Secondary | ICD-10-CM | POA: Diagnosis not present

## 2018-01-20 HISTORY — DX: Radiculopathy, cervical region: M54.12

## 2018-01-20 NOTE — Assessment & Plan Note (Signed)
Partial improvement after hydrodissection, nighttime splinting. She did have a positive Phalen sign on exam.

## 2018-01-20 NOTE — Progress Notes (Signed)
Subjective:    CC: Follow-up  HPI: Right hand numbness and tingling: Carpal tunnel syndrome with a positive Phalen sign, and a nerve conduction study many months ago that did show mild carpal tunnel syndrome, failed nighttime splinting so we did a ultrasound-guided median nerve hydrodissection, she has had fantastic relief in her symptoms, she only gets intermittent numbness and tingling but this time going down from the shoulder all the way to the thumb with prolonged gripping.  Better with abduction of the right arm.  No progressive weakness, no trauma, no constitutional symptoms.  She has already had an x-ray and has done greater than 6 weeks of formal physical therapy for her neck, has plateaued with PT.  I reviewed the past medical history, family history, social history, surgical history, and allergies today and no changes were needed.  Please see the problem list section below in epic for further details.  Past Medical History: Past Medical History:  Diagnosis Date  . Anxiety   . Depression   . Diabetes mellitus   . Gastro-esophageal reflux   . Hyperlipidemia   . Hypertension    Past Surgical History: Past Surgical History:  Procedure Laterality Date  . ABDOMINAL HYSTERECTOMY    . PIP JOINT FUSION     pinky   Social History: Social History   Socioeconomic History  . Marital status: Married    Spouse name: Not on file  . Number of children: Not on file  . Years of education: Not on file  . Highest education level: Not on file  Occupational History  . Not on file  Social Needs  . Financial resource strain: Not on file  . Food insecurity:    Worry: Not on file    Inability: Not on file  . Transportation needs:    Medical: Not on file    Non-medical: Not on file  Tobacco Use  . Smoking status: Never Smoker  . Smokeless tobacco: Never Used  Substance and Sexual Activity  . Alcohol use: No  . Drug use: No  . Sexual activity: Yes  Lifestyle  . Physical activity:     Days per week: Not on file    Minutes per session: Not on file  . Stress: Not on file  Relationships  . Social connections:    Talks on phone: Not on file    Gets together: Not on file    Attends religious service: Not on file    Active member of club or organization: Not on file    Attends meetings of clubs or organizations: Not on file    Relationship status: Not on file  Other Topics Concern  . Not on file  Social History Narrative  . Not on file   Family History: Family History  Problem Relation Age of Onset  . Diabetes Mother   . Dementia Mother   . Heart attack Father    Allergies: Allergies  Allergen Reactions  . Ampicillin   . Cefprozil   . Meloxicam    Medications: See med rec.  Review of Systems: No fevers, chills, night sweats, weight loss, chest pain, or shortness of breath.   Objective:    General: Well Developed, well nourished, and in no acute distress.  Neuro: Alert and oriented x3, extra-ocular muscles intact, sensation grossly intact.  HEENT: Normocephalic, atraumatic, pupils equal round reactive to light, neck supple, no masses, no lymphadenopathy, thyroid nonpalpable.  Skin: Warm and dry, no rashes. Cardiac: Regular rate and rhythm, no murmurs rubs  or gallops, no lower extremity edema.  Respiratory: Clear to auscultation bilaterally. Not using accessory muscles, speaking in full sentences. Right wrist: Inspection normal with no visible erythema or swelling. ROM smooth and normal with good flexion and extension and ulnar/radial deviation that is symmetrical with opposite wrist. Palpation is normal over metacarpals, navicular, lunate, and TFCC; tendons without tenderness/ swelling No snuffbox tenderness. No tenderness over Canal of Guyon. Strength 5/5 in all directions without pain. Negative tinel's and phalens signs. Negative Finkelstein sign. Negative Watson's test.  Impression and Recommendations:    Carpal tunnel syndrome,  right Partial improvement after hydrodissection, nighttime splinting. She did have a positive Phalen sign on exam.   Radiculitis of right cervical region Right C6 versus C7 distribution. Failed conservative measures including greater than 6 weeks of PT.  ___________________________________________ Gwen Her. Dianah Field, M.D., ABFM., CAQSM. Primary Care and Sports Medicine [facility]  Adjunct Professor of [place] [employer] [facility] [employer]

## 2018-01-20 NOTE — Assessment & Plan Note (Signed)
Right C6 versus C7 distribution. Failed conservative measures including greater than 6 weeks of PT.

## 2018-01-26 ENCOUNTER — Ambulatory Visit (INDEPENDENT_AMBULATORY_CARE_PROVIDER_SITE_OTHER): Payer: BLUE CROSS/BLUE SHIELD

## 2018-01-26 DIAGNOSIS — M5412 Radiculopathy, cervical region: Secondary | ICD-10-CM | POA: Diagnosis not present

## 2018-01-26 DIAGNOSIS — M4312 Spondylolisthesis, cervical region: Secondary | ICD-10-CM | POA: Diagnosis not present

## 2018-01-26 DIAGNOSIS — M47812 Spondylosis without myelopathy or radiculopathy, cervical region: Secondary | ICD-10-CM | POA: Diagnosis not present

## 2018-02-02 ENCOUNTER — Ambulatory Visit (INDEPENDENT_AMBULATORY_CARE_PROVIDER_SITE_OTHER): Payer: BLUE CROSS/BLUE SHIELD | Admitting: Sports Medicine

## 2018-02-02 ENCOUNTER — Encounter: Payer: Self-pay | Admitting: Sports Medicine

## 2018-02-02 DIAGNOSIS — M5412 Radiculopathy, cervical region: Secondary | ICD-10-CM

## 2018-02-02 NOTE — Assessment & Plan Note (Signed)
The MRI does show central canal as well as foraminal stenosis at C4-5 and C5-6. Tonya Chandler is having right C6 versus C7 distribution radiculitis. At this point has failed conservative measures including greater than 6 weeks of PT so we are going to proceed with a right C6-C7 interlaminar epidural. Return to see me 1 month after the injection to evaluate relief. Epidurals can be done up to 3 times in a 36-month period.

## 2018-02-02 NOTE — Progress Notes (Signed)
Subjective:    CC: Go over MRI results  HPI: Tonya Chandler returns, she is a pleasant 58 year old female with multifactorial right hand paresthesias.  We initially suspected only carpal tunnel syndrome, ultimately a median nerve hydrodissection provided good relief of her paresthesias in the volar hand, she continued to have paresthesias in the dorsal hand so we obtained a cervical spine MRI the results of which will be dictated below.  I reviewed the past medical history, family history, social history, surgical history, and allergies today and no changes were needed.  Please see the problem list section below in epic for further details.  Past Medical History: Past Medical History:  Diagnosis Date  . Anxiety   . Depression   . Diabetes mellitus   . Gastro-esophageal reflux   . Hyperlipidemia   . Hypertension    Past Surgical History: Past Surgical History:  Procedure Laterality Date  . ABDOMINAL HYSTERECTOMY    . PIP JOINT FUSION     pinky   Social History: Social History   Socioeconomic History  . Marital status: Married    Spouse name: Not on file  . Number of children: Not on file  . Years of education: Not on file  . Highest education level: Not on file  Occupational History  . Not on file  Social Needs  . Financial resource strain: Not on file  . Food insecurity:    Worry: Not on file    Inability: Not on file  . Transportation needs:    Medical: Not on file    Non-medical: Not on file  Tobacco Use  . Smoking status: Never Smoker  . Smokeless tobacco: Never Used  Substance and Sexual Activity  . Alcohol use: No  . Drug use: No  . Sexual activity: Yes  Lifestyle  . Physical activity:    Days per week: Not on file    Minutes per session: Not on file  . Stress: Not on file  Relationships  . Social connections:    Talks on phone: Not on file    Gets together: Not on file    Attends religious service: Not on file    Active member of club or organization: Not  on file    Attends meetings of clubs or organizations: Not on file    Relationship status: Not on file  Other Topics Concern  . Not on file  Social History Narrative  . Not on file   Family History: Family History  Problem Relation Age of Onset  . Diabetes Mother   . Dementia Mother   . Heart attack Father    Allergies: Allergies  Allergen Reactions  . Ampicillin   . Cefprozil   . Meloxicam    Medications: See med rec.  Review of Systems: No fevers, chills, night sweats, weight loss, chest pain, or shortness of breath.   Objective:    General: Well Developed, well nourished, and in no acute distress.  Neuro: Alert and oriented x3, extra-ocular muscles intact, sensation grossly intact.  HEENT: Normocephalic, atraumatic, pupils equal round reactive to light, neck supple, no masses, no lymphadenopathy, thyroid nonpalpable.  Skin: Warm and dry, no rashes. Cardiac: Regular rate and rhythm, no murmurs rubs or gallops, no lower extremity edema.  Respiratory: Clear to auscultation bilaterally. Not using accessory muscles, speaking in full sentences.  MRI personally reviewed, she has central canal stenosis as well as by foraminal stenosis from C4-C6.  Impression and Recommendations:    Radiculitis of right cervical region The  MRI does show central canal as well as foraminal stenosis at C4-5 and C5-6. Jennfer is having right C6 versus C7 distribution radiculitis. At this point has failed conservative measures including greater than 6 weeks of PT so we are going to proceed with a right C6-C7 interlaminar epidural. Return to see me 1 month after the injection to evaluate relief. Epidurals can be done up to 3 times in a 23-month period. ___________________________________________ Gwen Her. Dianah Field, M.D., ABFM., CAQSM. Primary Care and Sports Medicine [facility]  Adjunct Professor of [place] [employer] [facility] [employer]

## 2018-02-16 ENCOUNTER — Encounter: Payer: BLUE CROSS/BLUE SHIELD | Admitting: Neurology

## 2018-02-17 ENCOUNTER — Ambulatory Visit
Admission: RE | Admit: 2018-02-17 | Discharge: 2018-02-17 | Disposition: A | Discharge status: Home / Self Care | Payer: BLUE CROSS/BLUE SHIELD | Source: Ambulatory Visit | Attending: Sports Medicine | Admitting: Sports Medicine

## 2018-02-17 DIAGNOSIS — M47812 Spondylosis without myelopathy or radiculopathy, cervical region: Secondary | ICD-10-CM | POA: Diagnosis not present

## 2018-02-17 MED ORDER — TRIAMCINOLONE ACETONIDE 40 MG/ML IJ SUSP (RADIOLOGY)
60.0000 mg | Freq: Once | INTRAMUSCULAR | Status: AC
  Administered 2018-02-17: 60 mg via EPIDURAL

## 2018-02-17 MED ORDER — IOPAMIDOL (ISOVUE-M 300) INJECTION 61%
1.0000 mL | Freq: Once | INTRAMUSCULAR | Status: AC | PRN
  Administered 2018-02-17: 1 mL via EPIDURAL

## 2018-02-17 NOTE — Discharge Instructions (Signed)

## 2018-03-09 ENCOUNTER — Ambulatory Visit (INDEPENDENT_AMBULATORY_CARE_PROVIDER_SITE_OTHER): Payer: BLUE CROSS/BLUE SHIELD | Admitting: Sports Medicine

## 2018-03-09 ENCOUNTER — Encounter: Payer: Self-pay | Admitting: Sports Medicine

## 2018-03-09 DIAGNOSIS — M5412 Radiculopathy, cervical region: Secondary | ICD-10-CM | POA: Diagnosis not present

## 2018-03-09 MED ORDER — GABAPENTIN 800 MG PO TABS: ORAL_TABLET | ORAL | 3 refills | Status: DC

## 2018-03-09 NOTE — Assessment & Plan Note (Signed)
Multilevel cervical DDD, degenerative disc disease with disc protrusions C5-6 and C6-7. She does have some C4-C5 anterolisthesis. At this point we have done a cervical epidural with no relief, not in temporary, she has persistent right-sided radicular pain. Going to increase her gabapentin to 800 mg morning and midday and 1600 mg at bedtime and I would like an opinion from Dr. Phylliss Bob and Pricilla Holm, PA-C with spine surgery.

## 2018-03-09 NOTE — Progress Notes (Signed)
Subjective:    CC: Neck pain  HPI: Taimi returns, she is a pleasant 58 year old female with C4-C5 anterolisthesis, and C5-C7 DDD with central canal stenosis.  We have treated her conservatively, unfortunately she has not responded to conservative treatments including a cervical epidural.  I reviewed the past medical history, family history, social history, surgical history, and allergies today and no changes were needed.  Please see the problem list section below in epic for further details.  Past Medical History: Past Medical History:  Diagnosis Date  . Anxiety   . Depression   . Diabetes mellitus   . Gastro-esophageal reflux   . Hyperlipidemia   . Hypertension    Past Surgical History: Past Surgical History:  Procedure Laterality Date  . ABDOMINAL HYSTERECTOMY    . PIP JOINT FUSION     pinky   Social History: Social History   Socioeconomic History  . Marital status: Married    Spouse name: Not on file  . Number of children: Not on file  . Years of education: Not on file  . Highest education level: Not on file  Occupational History  . Not on file  Social Needs  . Financial resource strain: Not on file  . Food insecurity:    Worry: Not on file    Inability: Not on file  . Transportation needs:    Medical: Not on file    Non-medical: Not on file  Tobacco Use  . Smoking status: Never Smoker  . Smokeless tobacco: Never Used  Substance and Sexual Activity  . Alcohol use: No  . Drug use: No  . Sexual activity: Yes  Lifestyle  . Physical activity:    Days per week: Not on file    Minutes per session: Not on file  . Stress: Not on file  Relationships  . Social connections:    Talks on phone: Not on file    Gets together: Not on file    Attends religious service: Not on file    Active member of club or organization: Not on file    Attends meetings of clubs or organizations: Not on file    Relationship status: Not on file  Other Topics Concern  . Not on file    Social History Narrative  . Not on file   Family History: Family History  Problem Relation Age of Onset  . Diabetes Mother   . Dementia Mother   . Heart attack Father    Allergies: Allergies  Allergen Reactions  . Ampicillin   . Cefprozil   . Meloxicam    Medications: See med rec.  Review of Systems: No fevers, chills, night sweats, weight loss, chest pain, or shortness of breath.   Objective:    General: Well Developed, well nourished, and in no acute distress.  Neuro: Alert and oriented x3, extra-ocular muscles intact, sensation grossly intact.  HEENT: Normocephalic, atraumatic, pupils equal round reactive to light, neck supple, no masses, no lymphadenopathy, thyroid nonpalpable.  Skin: Warm and dry, no rashes. Cardiac: Regular rate and rhythm, no murmurs rubs or gallops, no lower extremity edema.  Respiratory: Clear to auscultation bilaterally. Not using accessory muscles, speaking in full sentences.  Impression and Recommendations:    Radiculitis of right cervical region Multilevel cervical DDD, degenerative disc disease with disc protrusions C5-6 and C6-7. She does have some C4-C5 anterolisthesis. At this point we have done a cervical epidural with no relief, not in temporary, she has persistent right-sided radicular pain. Going to increase her  gabapentin to 800 mg morning and midday and 1600 mg at bedtime and I would like an opinion from Dr. Phylliss Bob and Pricilla Holm, PA-C with spine surgery.  I spent 25 minutes with this patient, greater than 50% was face-to-face time counseling regarding the above diagnoses, specifically diagnosis, treatment options and anticipatory guidance with regards to failure of conservative measures with cervical DDD ___________________________________________ Gwen Her. Dianah Field, M.D., ABFM., CAQSM. Primary Care and Sports Medicine [facility]  Adjunct Professor of [place] of [facility] of Medicine

## 2018-03-16 DIAGNOSIS — M5412 Radiculopathy, cervical region: Secondary | ICD-10-CM | POA: Diagnosis not present

## 2018-03-24 ENCOUNTER — Other Ambulatory Visit: Payer: Self-pay | Admitting: Orthopedic Surgery

## 2018-03-24 DIAGNOSIS — M5412 Radiculopathy, cervical region: Secondary | ICD-10-CM | POA: Diagnosis not present

## 2018-03-27 NOTE — Pre-Procedure Instructions (Signed)
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 [place]  [address] Phone: [phone] Fax: [phone]    Your procedure is scheduled on March 12th.  Report to [facility] Entrance "A" Admitting at 12:00 P.M.  Call this number if you have problems the morning of surgery:  [phone]   Remember:  Do not eat or drink after midnight.     Take these medicines the morning of surgery with A SIP OF WATER   Tylenol - if needed  Cymbalta  Pepcid  Gabapentin   Follow your surgeon's instructions on when to stop Asprin.  If no instructions were given by your surgeon then you will need to call the office to get those instructions.    7 days prior to surgery STOP taking any Aleve, Naproxen, Ibuprofen, Motrin, Advil, Goody's, BC's, all herbal medications, fish oil, and all vitamins.   WHAT DO I DO ABOUT MY DIABETES MEDICATION?   Marland Kitchen Do not take oral diabetes medicines (pills) the morning of surgery. - Invokana   . THE DAY BEFORE - Do NOT take Invokana  . THE NIGHT BEFORE SURGERY, take 50% of dose (27 units) of Insulin Glargine.     . THE MORNING OF SURGERY, take 50% of dose (27 units) of Insulin Glargine.  . The day of surgery, do not take other diabetes injectables, including Byetta (exenatide), Bydureon (exenatide ER), Victoza (liraglutide), or Trulicity (dulaglutide).   How to Manage Your Diabetes Before and After Surgery  Why is it important to control my blood sugar before and after surgery? . Improving blood sugar levels before and after surgery helps healing and can limit problems. . A way of improving blood sugar control is eating a healthy diet by: o  Eating less sugar and carbohydrates o  Increasing activity/exercise o  Talking with your doctor about reaching your blood sugar goals . High blood sugars (greater than 180 mg/dL) can raise your risk  of infections and slow your recovery, so you will need to focus on controlling your diabetes during the weeks before surgery. . Make sure that the doctor who takes care of your diabetes knows about your planned surgery including the date and location.  How do I manage my blood sugar before surgery? . Check your blood sugar at least 4 times a day, starting 2 days before surgery, to make sure that the level is not too high or low. o Check your blood sugar the morning of your surgery when you wake up and every 2 hours until you get to the Short Stay unit. . If your blood sugar is less than 70 mg/dL, you will need to treat for low blood sugar: o Do not take insulin. o Treat a low blood sugar (less than 70 mg/dL) with  cup of clear juice (cranberry or apple), 4 glucose tablets, OR glucose gel. o Recheck blood sugar in 15 minutes after treatment (to make sure it is greater than 70 mg/dL). If your blood sugar is not greater than 70 mg/dL on recheck, call [phone] for further instructions. . Report your blood sugar to the short stay nurse when you get to Short Stay.  . If you are admitted to the hospital after surgery: o Your blood sugar will be checked by the staff and you will probably be given insulin after surgery (instead of  oral diabetes medicines) to make sure you have good blood sugar levels. o The goal for blood sugar control after surgery is 80-180 mg/dL.     Do not wear jewelry, make-up or nail polish.  Do not wear lotions, powders, or perfumes, or deodorant.  Do not shave 48 hours prior to surgery.    Do not bring valuables to the hospital.  [facility] is not responsible for any belongings or valuables.   [facility]- Preparing For Surgery  Before surgery, you can play an important role. Because skin is not sterile, your skin needs to be as free of germs as possible. You can reduce the number of germs on your skin by washing with CHG (chlorahexidine gluconate) Soap before  surgery.  CHG is an antiseptic cleaner which kills germs and bonds with the skin to continue killing germs even after washing.    Oral Hygiene is also important to reduce your risk of infection.  Remember - BRUSH YOUR TEETH THE MORNING OF SURGERY WITH YOUR REGULAR TOOTHPASTE  Please do not use if you have an allergy to CHG or antibacterial soaps. If your skin becomes reddened/irritated stop using the CHG.  Do not shave (including legs and underarms) for at least 48 hours prior to first CHG shower. It is OK to shave your face.  Please follow these instructions carefully.   1. Shower the NIGHT BEFORE SURGERY and the MORNING OF SURGERY with CHG.   2. If you chose to wash your hair, wash your hair first as usual with your normal shampoo.  3. After you shampoo, rinse your hair and body thoroughly to remove the shampoo.  4. Use CHG as you would any other liquid soap. You can apply CHG directly to the skin and wash gently with a scrungie or a clean washcloth.   5. Apply the CHG Soap to your body ONLY FROM THE NECK DOWN.  Do not use on open wounds or open sores. Avoid contact with your eyes, ears, mouth and genitals (private parts). Wash Face and genitals (private parts)  with your normal soap.  6. Wash thoroughly, paying special attention to the area where your surgery will be performed.  7. Thoroughly rinse your body with warm water from the neck down.  8. DO NOT shower/wash with your normal soap after using and rinsing off the CHG Soap.  9. Pat yourself dry with a CLEAN TOWEL.  10. Wear CLEAN PAJAMAS to bed the night before surgery, wear comfortable clothes the morning of surgery  11. Place CLEAN SHEETS on your bed the night of your first shower and DO NOT SLEEP WITH PETS.   Day of Surgery:  Do not apply any deodorants/lotions.  Please wear clean clothes to the hospital/surgery center.   Remember to brush your teeth WITH YOUR REGULAR TOOTHPASTE.   Contacts, dentures or bridgework  may not be worn into surgery.  Leave your suitcase in the car.  After surgery it may be brought to your room.  For patients admitted to the hospital, discharge time will be determined by your treatment team.  Patients discharged the day of surgery will not be allowed to drive home.   Please read over the following fact sheets that you were given. Coughing and Deep Breathing and Surgical Site Infection Prevention

## 2018-03-30 ENCOUNTER — Other Ambulatory Visit: Payer: Self-pay

## 2018-03-30 ENCOUNTER — Encounter (HOSPITAL_COMMUNITY)
Admission: RE | Admit: 2018-03-30 | Discharge: 2018-03-30 | Disposition: A | Discharge status: Home / Self Care | Payer: BLUE CROSS/BLUE SHIELD | Source: Ambulatory Visit | Attending: Orthopedic Surgery | Admitting: Orthopedic Surgery

## 2018-03-30 ENCOUNTER — Encounter (HOSPITAL_COMMUNITY): Payer: Self-pay

## 2018-03-30 DIAGNOSIS — Z01818 Encounter for other preprocedural examination: Secondary | ICD-10-CM | POA: Diagnosis not present

## 2018-03-30 LAB — URINALYSIS, ROUTINE W REFLEX MICROSCOPIC
Bilirubin Urine: NEGATIVE
Glucose, UA: >=500 mg/dL — AB
Hgb urine dipstick: NEGATIVE
Ketones, ur: NEGATIVE mg/dL
Leukocytes,Ua: NEGATIVE
Nitrite: NEGATIVE
Protein, ur: NEGATIVE mg/dL
Specific Gravity, Urine: 1.018 (ref 1.005–1.030)
pH: 7 (ref 5.0–8.0)

## 2018-03-30 LAB — CBC WITH DIFFERENTIAL/PLATELET
Abs Immature Granulocytes: 0.03 10*3/uL (ref 0.00–0.07)
BASOS ABS: 0.1 10*3/uL (ref 0.0–0.1)
Basophils Relative: 1 %
EOS ABS: 0.5 10*3/uL (ref 0.0–0.5)
Eosinophils Relative: 6 %
HCT: 50.3 % — ABNORMAL HIGH (ref 36.0–46.0)
Hemoglobin: 16.7 g/dL — ABNORMAL HIGH (ref 12.0–15.0)
Immature Granulocytes: 0 %
Lymphocytes Relative: 32 %
Lymphs Abs: 2.8 10*3/uL (ref 0.7–4.0)
MCH: 30 pg (ref 26.0–34.0)
MCHC: 33.2 g/dL (ref 30.0–36.0)
MCV: 90.5 fL (ref 80.0–100.0)
Monocytes Absolute: 0.7 10*3/uL (ref 0.1–1.0)
Monocytes Relative: 8 %
Neutro Abs: 4.7 10*3/uL (ref 1.7–7.7)
Neutrophils Relative %: 53 %
PLATELETS: 328 10*3/uL (ref 150–400)
RBC: 5.56 MIL/uL — ABNORMAL HIGH (ref 3.87–5.11)
RDW: 12.8 % (ref 11.5–15.5)
WBC: 8.8 10*3/uL (ref 4.0–10.5)
nRBC: 0 % (ref 0.0–0.2)

## 2018-03-30 LAB — COMPREHENSIVE METABOLIC PANEL
ALT: 15 U/L (ref 0–44)
AST: 21 U/L (ref 15–41)
Albumin: 4.1 g/dL (ref 3.5–5.0)
Alkaline Phosphatase: 76 U/L (ref 38–126)
Anion gap: 14 (ref 5–15)
BUN: 10 mg/dL (ref 6–20)
CHLORIDE: 98 mmol/L (ref 98–111)
CO2: 26 mmol/L (ref 22–32)
Calcium: 9.9 mg/dL (ref 8.9–10.3)
Creatinine, Ser: 0.57 mg/dL (ref 0.44–1.00)
GFR calc Af Amer: >60 mL/min (ref >60)
GFR calc non Af Amer: >60 mL/min (ref >60)
Glucose, Bld: 140 mg/dL — ABNORMAL HIGH (ref 70–99)
Potassium: 3.2 mmol/L — ABNORMAL LOW (ref 3.5–5.1)
Sodium: 138 mmol/L (ref 135–145)
Total Bilirubin: 0.5 mg/dL (ref 0.3–1.2)
Total Protein: 7.2 g/dL (ref 6.5–8.1)

## 2018-03-30 LAB — PROTIME-INR
INR: 0.9 (ref 0.8–1.2)
Prothrombin Time: 11.9 seconds (ref 11.4–15.2)

## 2018-03-30 LAB — ABO/RH: ABO/RH(D): O POS

## 2018-03-30 LAB — TYPE AND SCREEN
ABO/RH(D): O POS
ANTIBODY SCREEN: NEGATIVE

## 2018-03-30 LAB — APTT: aPTT: 25 seconds (ref 24–36)

## 2018-03-30 LAB — SURGICAL PCR SCREEN
MRSA, PCR: NEGATIVE
Staphylococcus aureus: NEGATIVE

## 2018-03-30 LAB — HEMOGLOBIN A1C
Hgb A1c MFr Bld: 9 % — ABNORMAL HIGH (ref 4.8–5.6)
Mean Plasma Glucose: 211.6 mg/dL

## 2018-03-30 LAB — GLUCOSE, CAPILLARY: Glucose-Capillary: 153 mg/dL — ABNORMAL HIGH (ref 70–99)

## 2018-03-30 NOTE — Progress Notes (Addendum)
PCP - Drake Leach, MD Cardiologist - pt denies  Chest x-ray -  EKG - 03/30/2018 at PAT appointment  Stress Test - 04/24/15-results in Care Everywhere-normal ECHO -  Pt denies past 10 years  Cardiac Cath - pt denies  Sleep Study - pt denies CPAP - n/a  Fasting Blood Sugar - 120 Checks Blood Sugar _____ times a day- not regularly  Blood Thinner Instructions: n/a Aspirin Instructions: Aspirin 325 mg -on hold since March 22, 2018  Anesthesia review: Yes-EKG  Patient denies shortness of breath, fever, cough and chest pain at PAT appointment  Patient verbalized understanding of instructions that were given to them at the PAT appointment. Patient was also instructed that they will need to review over the PAT instructions again at home before surgery.

## 2018-03-30 NOTE — Progress Notes (Signed)
Tonya Chandler            03/27/2018                          [id number] - [place], [place] - [address] AT [place] [address]) [address] [place] [address] Phone: [phone] Fax: [phone]              Your procedure is scheduled on March 12th.            Report to [facility] Entrance "A" Admitting at 12:00 PM.            Call this number if you have problems the morning of surgery:            [phone]             Remember:            Do not eat or drink after midnight.                                   Take these medicines the morning of surgery with A SIP OF WATER             Tylenol - if needed            Cymbalta            Pepcid            Gabapentin   Follow your surgeon's instructions on when to stop Asprin.  If no instructions were given by your surgeon then you will need to call the office to get those instructions.    7 days prior to surgery STOP taking any Aleve, Naproxen, Ibuprofen, Motrin, Advil, Goody's, BC's, all herbal medications, fish oil, and all vitamins.   WHAT DO I DO ABOUT MY DIABETES MEDICATION?    Do not take oral diabetes medicines (pills) the morning of surgery. - Invokana    THE DAY BEFORE - Do NOT take Invokana   THE NIGHT BEFORE SURGERY (or the morning of surgery-whenever you normally take your Insulin Glargine), take 50% of dose (27 units) of Insulin Glargine.                                  The day of surgery, do not take other diabetes injectables, including Byetta (exenatide), Bydureon (exenatide ER), Victoza (liraglutide), or Trulicity (dulaglutide).   HOW TO MANAGE YOUR DIABETES BEFORE AND AFTER SURGERY  Why is it important to control my blood sugar before and after surgery?  Improving blood sugar levels before and after surgery helps healing and can limit problems.  A way of improving blood sugar control is eating a healthy diet by: ?   Eating less sugar and carbohydrates ?  Increasing activity/exercise ?  Talking with your doctor about reaching your blood sugar goals  High blood sugars (greater than 180 mg/dL) can raise your risk of infections and slow your recovery, so you will need to focus on controlling your diabetes during the weeks before surgery.  Make sure that the doctor who takes care of your diabetes knows about your planned surgery including the date and location.  How do I manage my blood sugar before surgery?  Check your blood  sugar at least 4 times a day, starting 2 days before surgery, to make sure that the level is not too high or low. ? Check your blood sugar the morning of your surgery when you wake up and every 2 hours until you get to the Short Stay unit.  If your blood sugar is less than 70 mg/dL, you will need to treat for low blood sugar: ? Do not take insulin. ? Treat a low blood sugar (less than 70 mg/dL) with  cup of clear juice (cranberry or apple), 4glucose tablets, OR glucose gel. ? Recheck blood sugar in 15 minutes after treatment (to make sure it is greater than 70 mg/dL). If your blood sugar is not greater than 70 mg/dL on recheck, call [phone] for further instructions.  Report your blood sugar to the short stay nurse when you get to Short Stay.   If you are admitted to the hospital after surgery: ? Your blood sugar will be checked by the staff and you will probably be given insulin after surgery (instead of oral diabetes medicines) to make sure you have good blood sugar levels. ? The goal for blood sugar control after surgery is 80-180 mg/dL.    Tonya Chandler- Preparing For Surgery  Before surgery, you can play an important role. Because skin is not sterile, your skin needs to be as free of germs as possible. You can reduce the number of germs on your skin by washing with CHG (chlorahexidine gluconate) Soap before surgery.  CHG is an antiseptic cleaner which kills germs and  bonds with the skin to continue killing germs even after washing.    Oral Hygiene is also important to reduce your risk of infection.  Remember - BRUSH YOUR TEETH THE MORNING OF SURGERY WITH YOUR REGULAR TOOTHPASTE  Please do not use if you have an allergy to CHG or antibacterial soaps. If your skin becomes reddened/irritated stop using the CHG.  Do not shave (including legs and underarms) for at least 48 hours prior to first CHG shower. It is OK to shave your face.  Please follow these instructions carefully.                                                                                                                     1. Shower the NIGHT BEFORE SURGERY and the MORNING OF SURGERY with CHG.   2. If you chose to wash your hair, wash your hair first as usual with your normal shampoo.  3. After you shampoo, rinse your hair and body thoroughly to remove the shampoo.  4. Use CHG as you would any other liquid soap. You can apply CHG directly to the skin and wash gently with a scrungie or a clean washcloth.   5. Apply the CHG Soap to your body ONLY FROM THE NECK DOWN.  Do not use on open wounds or open sores. Avoid contact with your eyes, ears, mouth and genitals (private parts). Wash Face and genitals (private parts)  with your  normal soap.  6. Wash thoroughly, paying special attention to the area where your surgery will be performed.  7. Thoroughly rinse your body with warm water from the neck down.  8. DO NOT shower/wash with your normal soap after using and rinsing off the CHG Soap.  9. Pat yourself dry with a CLEAN TOWEL.  10. Wear CLEAN PAJAMAS to bed the night before surgery, wear comfortable clothes the morning of surgery  11. Place CLEAN SHEETS on your bed the night of your first shower and DO NOT SLEEP WITH PETS.   Day of Surgery:  Do not apply any deodorants/lotions.  Please wear clean clothes to the hospital/surgery center.   Remember to brush your teeth  WITH YOUR REGULAR TOOTHPASTE.            Do not wear jewelry, make-up or nail polish.            Do not wear lotions, powders, or perfumes, or deodorant.            Do not shave 48 hours prior to surgery.              Do not bring valuables to the hospital.            [facility] is not responsible for any belongings or valuables.  Contacts, dentures or bridgework may not be worn into surgery.  Leave your suitcase in the car.  After surgery it may be brought to your room.  For patients admitted to the hospital, discharge time will be determined by your treatment team.  Patients discharged the day of surgery will not be allowed to drive home.   Please read over the following fact sheets that you were given. Coughing and Deep Breathing and Surgical Site Infection Prevention

## 2018-03-31 ENCOUNTER — Other Ambulatory Visit: Payer: Self-pay | Admitting: Sports Medicine

## 2018-03-31 DIAGNOSIS — M5412 Radiculopathy, cervical region: Secondary | ICD-10-CM

## 2018-03-31 MED ORDER — GABAPENTIN 800 MG PO TABS: ORAL_TABLET | ORAL | 3 refills | Status: DC

## 2018-03-31 NOTE — Progress Notes (Addendum)
Anesthesia Chart Review:  Case:  [id number] Date/Time:  04/02/18 1451   Procedure:  ANTERIOR CERVICAL DECOMPRESSION FUSION, CERVICAL 4-5, CERVICAL 5-6, WITH INSTRUMENTATION AND ALLOGRAFT (N/A )   Anesthesia type:  General   Pre-op diagnosis:  RIGHT - SIDED CERVICAL RADICULOPATHY   Location:  [facility] OR ROOM 05 / [facility]   Surgeon:  Phylliss Bob, MD      DISCUSSION: 58 yo female never smoker. Pertinent hx includes HTN, HLD, IDDM, GERD, Anxiety, Depression.   EKG done at PAT appt showed marked anterolateral t-wave inversions. No comparison EKG on file. I called pt's PCP, Dr. Sallyanne Havers, and faxed EKG for his review. He said these changes were new, significantly different from a tracing he had on file from 2018. He recommended cardiology eval prior to elective surgery. I called Butch Penny at Dr. Laurena Bering office to make her aware.  Ability to proceed with case pending cardiology eval.   Addendum 04/01/2018: Pt seen by cardiologist Dr. Hamilton Capri 04/01/2018, had stress echo, and was cleared "Negative stress echo. Proceed with elective surgery from cardiovascular standpoint." Stress echo was read by Dr. Hamilton Capri at time of test, results are not yet available in Epic. Per Dr. Dion Body office they are having IT issues, currently being worked on, are unable to retreive stress test result for transmittance. Butch Penny from Dr. Laurena Bering office has called as well, results are to be sent as soon as possible.   VS: BP (!) 156/93   Pulse 90   Temp 36.7 C   Resp 20   Ht 5' (1.524 m)   Wt 71.6 kg   SpO2 99%   BMI 30.82 kg/m   PROVIDERS: Drake Leach, MD is PCP  Almira Coaster, MD is Cardiologist  LABS: Notable for poorly controlled IDDM. (all labs ordered are listed, but only abnormal results are displayed)  Labs Reviewed  GLUCOSE, CAPILLARY - Abnormal; Notable for the following components:      Result Value   Glucose-Capillary 153 (*)    All other components within normal limits  HEMOGLOBIN A1C -  Abnormal; Notable for the following components:   Hgb A1c MFr Bld 9.0 (*)    All other components within normal limits  CBC WITH DIFFERENTIAL/PLATELET - Abnormal; Notable for the following components:   RBC 5.56 (*)    Hemoglobin 16.7 (*)    HCT 50.3 (*)    All other components within normal limits  COMPREHENSIVE METABOLIC PANEL - Abnormal; Notable for the following components:   Potassium 3.2 (*)    Glucose, Bld 140 (*)    All other components within normal limits  URINALYSIS, ROUTINE W REFLEX MICROSCOPIC - Abnormal; Notable for the following components:   Glucose, UA >=500 (*)    Bacteria, UA RARE (*)    All other components within normal limits  SURGICAL PCR SCREEN  APTT  PROTIME-INR  TYPE AND SCREEN  ABO/RH     IMAGES: MRI Cspine 01/26/2018: IMPRESSION: 1. Generalized facet degeneration with anterolisthesis at C4-5 and C5-6. Disc degeneration at C4-5 to C6-7. 2. On the symptomatic right side there is foraminal impingement at both C4-5 and C5-6. 3. On the left there is foraminal impingement that is advanced at C6-7 and mild-to-moderate at C4-5 and C5-6  EKG: Normal sinus rhythm. Rate 79. Marked T wave abnormality, consider anterolateral ischemia Prolonged QT. QT/QTc 410/470 ms.  CV: Treadmill stress 05/04/2015 care everywhere:  Excellent functional capacity, achieved 11.3 METS  Pt reports left neck/jaw pain at peak of exercise. No EKG changes noted.  Negative stres test.   Past Medical History:  Diagnosis Date  . Anxiety   . Depression   . Diabetes mellitus   . Gastro-esophageal reflux   . Hyperlipidemia   . Hypertension     Past Surgical History:  Procedure Laterality Date  . ABDOMINAL HYSTERECTOMY    . PIP JOINT FUSION Bilateral    pinky    MEDICATIONS: . acetaminophen (TYLENOL) 500 MG tablet  . aspirin 325 MG tablet  . Cyanocobalamin (VITAMIN B-12) 2500 MCG SUBL  . cyclobenzaprine (FLEXERIL) 10 MG tablet  . Dulaglutide (TRULICITY) 1.5 PJ/8.2NK  SOPN  . DULoxetine (CYMBALTA) 60 MG capsule  . estradiol (ESTRACE) 1 MG tablet  . famotidine (PEPCID) 20 MG tablet  . gabapentin (NEURONTIN) 800 MG tablet  . Insulin Glargine (BASAGLAR KWIKPEN) 100 UNIT/ML SOPN  . INVOKANA 300 MG TABS tablet  . magnesium oxide (MAG-OX) 400 MG tablet  . mirtazapine (REMERON) 45 MG tablet  . rOPINIRole (REQUIP) 0.5 MG tablet  . trandolapril-verapamil (TARKA) 2-240 MG per tablet  . triamterene-hydrochlorothiazide (MAXZIDE-25) 37.5-25 MG tablet   No current facility-administered medications for this encounter.     Wynonia Musty [facility] Short Stay Center/Anesthesiology Phone [phone] 04/01/2018 1:16 PM

## 2018-04-01 DIAGNOSIS — Z01810 Encounter for preprocedural cardiovascular examination: Secondary | ICD-10-CM | POA: Diagnosis not present

## 2018-04-01 DIAGNOSIS — R9431 Abnormal electrocardiogram [ECG] [EKG]: Secondary | ICD-10-CM

## 2018-04-01 HISTORY — DX: Abnormal electrocardiogram (ECG) (EKG): R94.31

## 2018-04-01 NOTE — Anesthesia Preprocedure Evaluation (Addendum)
Anesthesia Evaluation  Patient identified by MRN, date of birth, ID band Patient awake    Reviewed: Allergy & Precautions, H&P , NPO status , Patient's Chart, lab work & pertinent test results  Airway Mallampati: II   Neck ROM: full    Dental   Pulmonary neg pulmonary ROS,    breath sounds clear to auscultation       Cardiovascular hypertension,  Rhythm:regular Rate:Normal     Neuro/Psych PSYCHIATRIC DISORDERS Anxiety Depression  Neuromuscular disease    GI/Hepatic GERD  ,  Endo/Other  diabetes, Type 2, Insulin Dependent  Renal/GU      Musculoskeletal   Abdominal   Peds  Hematology   Anesthesia Other Findings   Reproductive/Obstetrics                            Anesthesia Physical Anesthesia Plan  ASA: II  Anesthesia Plan: General   Post-op Pain Management:    Induction: Intravenous  PONV Risk Score and Plan: 3 and Ondansetron, Dexamethasone, Midazolam and Treatment may vary due to age or medical condition  Airway Management Planned: LMA  Additional Equipment:   Intra-op Plan:   Post-operative Plan:   Informed Consent: I have reviewed the patients History and Physical, chart, labs and discussed the procedure including the risks, benefits and alternatives for the proposed anesthesia with the patient or authorized representative who has indicated his/her understanding and acceptance.       Plan Discussed with: CRNA, Anesthesiologist and Surgeon  Anesthesia Plan Comments: (See PAT note by Karoline Caldwell, PA-C )       Anesthesia Quick Evaluation

## 2018-04-02 ENCOUNTER — Ambulatory Visit (HOSPITAL_COMMUNITY): Payer: BLUE CROSS/BLUE SHIELD

## 2018-04-02 ENCOUNTER — Observation Stay (HOSPITAL_COMMUNITY)
Admission: AD | Admit: 2018-04-02 | Discharge: 2018-04-03 | Disposition: A | Discharge status: Home / Self Care | Payer: BLUE CROSS/BLUE SHIELD | Attending: Orthopedic Surgery | Admitting: Orthopedic Surgery

## 2018-04-02 ENCOUNTER — Ambulatory Visit (HOSPITAL_COMMUNITY): Payer: BLUE CROSS/BLUE SHIELD | Admitting: Physician Assistant

## 2018-04-02 ENCOUNTER — Encounter (HOSPITAL_COMMUNITY)
Admission: AD | Disposition: A | Discharge status: Home / Self Care | Payer: Self-pay | Source: Home / Self Care | Attending: Orthopedic Surgery

## 2018-04-02 ENCOUNTER — Other Ambulatory Visit: Payer: Self-pay

## 2018-04-02 ENCOUNTER — Ambulatory Visit (HOSPITAL_COMMUNITY): Payer: BLUE CROSS/BLUE SHIELD | Admitting: Anesthesiology

## 2018-04-02 ENCOUNTER — Encounter (HOSPITAL_COMMUNITY): Payer: Self-pay

## 2018-04-02 DIAGNOSIS — Z794 Long term (current) use of insulin: Secondary | ICD-10-CM | POA: Diagnosis not present

## 2018-04-02 DIAGNOSIS — Z79899 Other long term (current) drug therapy: Secondary | ICD-10-CM | POA: Diagnosis not present

## 2018-04-02 DIAGNOSIS — I10 Essential (primary) hypertension: Secondary | ICD-10-CM | POA: Insufficient documentation

## 2018-04-02 DIAGNOSIS — Z79890 Hormone replacement therapy (postmenopausal): Secondary | ICD-10-CM | POA: Insufficient documentation

## 2018-04-02 DIAGNOSIS — M5410 Radiculopathy, site unspecified: Secondary | ICD-10-CM | POA: Diagnosis present

## 2018-04-02 DIAGNOSIS — M5412 Radiculopathy, cervical region: Secondary | ICD-10-CM | POA: Insufficient documentation

## 2018-04-02 DIAGNOSIS — F329 Major depressive disorder, single episode, unspecified: Secondary | ICD-10-CM | POA: Diagnosis not present

## 2018-04-02 DIAGNOSIS — F419 Anxiety disorder, unspecified: Secondary | ICD-10-CM | POA: Diagnosis not present

## 2018-04-02 DIAGNOSIS — Z7982 Long term (current) use of aspirin: Secondary | ICD-10-CM | POA: Diagnosis not present

## 2018-04-02 DIAGNOSIS — M4322 Fusion of spine, cervical region: Secondary | ICD-10-CM | POA: Diagnosis not present

## 2018-04-02 DIAGNOSIS — E785 Hyperlipidemia, unspecified: Secondary | ICD-10-CM | POA: Diagnosis not present

## 2018-04-02 DIAGNOSIS — M4802 Spinal stenosis, cervical region: Principal | ICD-10-CM | POA: Insufficient documentation

## 2018-04-02 DIAGNOSIS — E119 Type 2 diabetes mellitus without complications: Secondary | ICD-10-CM | POA: Insufficient documentation

## 2018-04-02 DIAGNOSIS — Z419 Encounter for procedure for purposes other than remedying health state, unspecified: Secondary | ICD-10-CM

## 2018-04-02 HISTORY — DX: Radiculopathy, site unspecified: M54.10

## 2018-04-02 HISTORY — PX: ANTERIOR CERVICAL DECOMP/DISCECTOMY FUSION: SHX1161

## 2018-04-02 LAB — GLUCOSE, CAPILLARY
Glucose-Capillary: 251 mg/dL — ABNORMAL HIGH (ref 70–99)
Glucose-Capillary: 180 mg/dL — ABNORMAL HIGH (ref 70–99)
Glucose-Capillary: 229 mg/dL — ABNORMAL HIGH (ref 70–99)
Glucose-Capillary: 272 mg/dL — ABNORMAL HIGH (ref 70–99)
Glucose-Capillary: 313 mg/dL — ABNORMAL HIGH (ref 70–99)

## 2018-04-02 SURGERY — ANTERIOR CERVICAL DECOMPRESSION/DISCECTOMY FUSION 2 LEVELS
Anesthesia: General

## 2018-04-02 SURGERY — Surgical Case
Anesthesia: *Unknown

## 2018-04-02 MED ORDER — POVIDONE-IODINE 7.5 % EX SOLN: Freq: Once | CUTANEOUS | Status: DC

## 2018-04-02 MED ORDER — OXYCODONE HCL 5 MG/5ML PO SOLN: 5.0000 mg | Freq: Once | ORAL | Status: AC | PRN

## 2018-04-02 MED ORDER — SODIUM CHLORIDE 0.9 % IV SOLN: 250.0000 mL | INTRAVENOUS | Status: DC

## 2018-04-02 MED ORDER — THROMBIN 20000 UNITS EX SOLR
CUTANEOUS | Status: AC
  Filled 2018-04-02: qty 20000

## 2018-04-02 MED ORDER — 0.9 % SODIUM CHLORIDE (POUR BTL) OPTIME
TOPICAL | Status: DC | PRN
  Administered 2018-04-02 (×2): 1000 mL

## 2018-04-02 MED ORDER — MIDAZOLAM HCL 5 MG/5ML IJ SOLN
INTRAMUSCULAR | Status: DC | PRN
  Administered 2018-04-02: 2 mg via INTRAVENOUS

## 2018-04-02 MED ORDER — MIDAZOLAM HCL 2 MG/2ML IJ SOLN
INTRAMUSCULAR | Status: AC
  Filled 2018-04-02: qty 2

## 2018-04-02 MED ORDER — FENTANYL CITRATE (PF) 100 MCG/2ML IJ SOLN
INTRAMUSCULAR | Status: AC
  Filled 2018-04-02: qty 2

## 2018-04-02 MED ORDER — CANAGLIFLOZIN 300 MG PO TABS
300.0000 mg | ORAL_TABLET | Freq: Every day | ORAL | Status: DC
  Administered 2018-04-02: 300 mg via ORAL
  Filled 2018-04-02 (×2): qty 1

## 2018-04-02 MED ORDER — VERAPAMIL HCL ER 240 MG PO TBCR
240.0000 mg | EXTENDED_RELEASE_TABLET | Freq: Every day | ORAL | Status: DC
  Filled 2018-04-02 (×2): qty 1

## 2018-04-02 MED ORDER — ONDANSETRON HCL 4 MG/2ML IJ SOLN
INTRAMUSCULAR | Status: AC
  Filled 2018-04-02: qty 2

## 2018-04-02 MED ORDER — VANCOMYCIN HCL IN DEXTROSE 1-5 GM/200ML-% IV SOLN
1000.0000 mg | INTRAVENOUS | Status: AC
  Administered 2018-04-02: 1000 mg via INTRAVENOUS

## 2018-04-02 MED ORDER — VANCOMYCIN HCL IN DEXTROSE 1-5 GM/200ML-% IV SOLN: 1000.0000 mg | Freq: Once | INTRAVENOUS | Status: DC

## 2018-04-02 MED ORDER — DEXAMETHASONE SODIUM PHOSPHATE 10 MG/ML IJ SOLN
INTRAMUSCULAR | Status: DC | PRN
  Administered 2018-04-02: 10 mg via INTRAVENOUS

## 2018-04-02 MED ORDER — ACETAMINOPHEN 650 MG RE SUPP: 650.0000 mg | RECTAL | Status: DC | PRN

## 2018-04-02 MED ORDER — TRIAMTERENE-HCTZ 37.5-25 MG PO TABS
1.0000 | ORAL_TABLET | Freq: Every day | ORAL | Status: DC
  Administered 2018-04-02: 1 via ORAL
  Filled 2018-04-02 (×2): qty 1

## 2018-04-02 MED ORDER — FLEET ENEMA 7-19 GM/118ML RE ENEM: 1.0000 | ENEMA | Freq: Once | RECTAL | Status: DC | PRN

## 2018-04-02 MED ORDER — VANCOMYCIN HCL IN DEXTROSE 1-5 GM/200ML-% IV SOLN
1000.0000 mg | Freq: Once | INTRAVENOUS | Status: AC
  Administered 2018-04-03: 1000 mg via INTRAVENOUS
  Filled 2018-04-02: qty 200

## 2018-04-02 MED ORDER — FENTANYL CITRATE (PF) 250 MCG/5ML IJ SOLN
INTRAMUSCULAR | Status: AC
  Filled 2018-04-02: qty 5

## 2018-04-02 MED ORDER — PHENOL 1.4 % MT LIQD: 1.0000 | OROMUCOSAL | Status: DC | PRN

## 2018-04-02 MED ORDER — DIAZEPAM 5 MG PO TABS
5.0000 mg | ORAL_TABLET | Freq: Four times a day (QID) | ORAL | Status: DC | PRN
  Administered 2018-04-02 – 2018-04-03 (×3): 5 mg via ORAL
  Filled 2018-04-02 (×2): qty 1

## 2018-04-02 MED ORDER — PROPOFOL 10 MG/ML IV BOLUS
INTRAVENOUS | Status: AC
  Filled 2018-04-02: qty 20

## 2018-04-02 MED ORDER — MENTHOL 3 MG MT LOZG: 1.0000 | LOZENGE | OROMUCOSAL | Status: DC | PRN

## 2018-04-02 MED ORDER — ONDANSETRON HCL 4 MG/2ML IJ SOLN: 4.0000 mg | Freq: Four times a day (QID) | INTRAMUSCULAR | Status: DC | PRN

## 2018-04-02 MED ORDER — PANTOPRAZOLE SODIUM 40 MG IV SOLR
40.0000 mg | Freq: Every day | INTRAVENOUS | Status: DC
  Administered 2018-04-02: 40 mg via INTRAVENOUS
  Filled 2018-04-02: qty 40

## 2018-04-02 MED ORDER — DULOXETINE HCL 30 MG PO CPEP: 60.0000 mg | ORAL_CAPSULE | Freq: Every day | ORAL | Status: DC

## 2018-04-02 MED ORDER — INSULIN ASPART 100 UNIT/ML ~~LOC~~ SOLN: 0.0000 [IU] | Freq: Three times a day (TID) | SUBCUTANEOUS | Status: DC

## 2018-04-02 MED ORDER — ONDANSETRON HCL 4 MG/2ML IJ SOLN
INTRAMUSCULAR | Status: DC | PRN
  Administered 2018-04-02: 4 mg via INTRAVENOUS

## 2018-04-02 MED ORDER — SUGAMMADEX SODIUM 200 MG/2ML IV SOLN
INTRAVENOUS | Status: DC | PRN
  Administered 2018-04-02: 150 mg via INTRAVENOUS

## 2018-04-02 MED ORDER — OXYCODONE HCL 5 MG PO TABS
ORAL_TABLET | ORAL | Status: AC
  Filled 2018-04-02: qty 1

## 2018-04-02 MED ORDER — DOCUSATE SODIUM 100 MG PO CAPS
100.0000 mg | ORAL_CAPSULE | Freq: Two times a day (BID) | ORAL | Status: DC
  Administered 2018-04-02: 100 mg via ORAL
  Filled 2018-04-02: qty 1

## 2018-04-02 MED ORDER — INSULIN ASPART 100 UNIT/ML ~~LOC~~ SOLN
0.0000 [IU] | Freq: Every day | SUBCUTANEOUS | Status: DC
  Administered 2018-04-02: 4 [IU] via SUBCUTANEOUS

## 2018-04-02 MED ORDER — ROCURONIUM BROMIDE 100 MG/10ML IV SOLN
INTRAVENOUS | Status: DC | PRN
  Administered 2018-04-02: 50 mg via INTRAVENOUS
  Administered 2018-04-02 (×2): 20 mg via INTRAVENOUS
  Administered 2018-04-02 (×2): 30 mg via INTRAVENOUS

## 2018-04-02 MED ORDER — ROCURONIUM BROMIDE 50 MG/5ML IV SOSY
PREFILLED_SYRINGE | INTRAVENOUS | Status: AC
  Filled 2018-04-02: qty 5

## 2018-04-02 MED ORDER — DEXAMETHASONE SODIUM PHOSPHATE 10 MG/ML IJ SOLN
INTRAMUSCULAR | Status: AC
  Filled 2018-04-02: qty 1

## 2018-04-02 MED ORDER — ESMOLOL HCL 100 MG/10ML IV SOLN
INTRAVENOUS | Status: AC
  Filled 2018-04-02: qty 10

## 2018-04-02 MED ORDER — LABETALOL HCL 5 MG/ML IV SOLN
INTRAVENOUS | Status: DC | PRN
  Administered 2018-04-02: 5 mg via INTRAVENOUS

## 2018-04-02 MED ORDER — MIRTAZAPINE 45 MG PO TABS
45.0000 mg | ORAL_TABLET | Freq: Every day | ORAL | Status: DC
  Filled 2018-04-02 (×2): qty 1

## 2018-04-02 MED ORDER — OXYCODONE-ACETAMINOPHEN 5-325 MG PO TABS
1.0000 | ORAL_TABLET | ORAL | Status: DC | PRN
  Administered 2018-04-02 – 2018-04-03 (×4): 2 via ORAL
  Filled 2018-04-02 (×4): qty 2

## 2018-04-02 MED ORDER — SENNOSIDES-DOCUSATE SODIUM 8.6-50 MG PO TABS: 1.0000 | ORAL_TABLET | Freq: Every evening | ORAL | Status: DC | PRN

## 2018-04-02 MED ORDER — FAMOTIDINE 20 MG PO TABS
20.0000 mg | ORAL_TABLET | Freq: Every day | ORAL | Status: DC
  Administered 2018-04-03: 20 mg via ORAL

## 2018-04-02 MED ORDER — VANCOMYCIN HCL IN DEXTROSE 1-5 GM/200ML-% IV SOLN
INTRAVENOUS | Status: AC
  Administered 2018-04-02: 1000 mg via INTRAVENOUS
  Filled 2018-04-02: qty 200

## 2018-04-02 MED ORDER — SODIUM CHLORIDE 0.9% FLUSH: 3.0000 mL | INTRAVENOUS | Status: DC | PRN

## 2018-04-02 MED ORDER — LIDOCAINE 2% (20 MG/ML) 5 ML SYRINGE
INTRAMUSCULAR | Status: DC | PRN
  Administered 2018-04-02: 60 mg via INTRAVENOUS

## 2018-04-02 MED ORDER — DIAZEPAM 5 MG PO TABS
ORAL_TABLET | ORAL | Status: AC
  Filled 2018-04-02: qty 1

## 2018-04-02 MED ORDER — ESTRADIOL 1 MG PO TABS
1.0000 mg | ORAL_TABLET | Freq: Every day | ORAL | Status: DC
  Administered 2018-04-02: 1 mg via ORAL
  Filled 2018-04-02: qty 1

## 2018-04-02 MED ORDER — SODIUM CHLORIDE 0.9% FLUSH
3.0000 mL | Freq: Two times a day (BID) | INTRAVENOUS | Status: DC
  Administered 2018-04-02: 3 mL via INTRAVENOUS

## 2018-04-02 MED ORDER — TRANDOLAPRIL 2 MG PO TABS
2.0000 mg | ORAL_TABLET | Freq: Every day | ORAL | Status: DC
  Filled 2018-04-02: qty 1

## 2018-04-02 MED ORDER — GABAPENTIN 800 MG PO TABS
800.0000 mg | ORAL_TABLET | Freq: Two times a day (BID) | ORAL | Status: DC
  Filled 2018-04-02: qty 1

## 2018-04-02 MED ORDER — GABAPENTIN 400 MG PO CAPS
800.0000 mg | ORAL_CAPSULE | Freq: Two times a day (BID) | ORAL | Status: DC
  Administered 2018-04-02: 800 mg via ORAL
  Filled 2018-04-02: qty 2

## 2018-04-02 MED ORDER — LACTATED RINGERS IV SOLN
INTRAVENOUS | Status: DC
  Administered 2018-04-02: 11:00:00 via INTRAVENOUS

## 2018-04-02 MED ORDER — BISACODYL 5 MG PO TBEC: 5.0000 mg | DELAYED_RELEASE_TABLET | Freq: Every day | ORAL | Status: DC | PRN

## 2018-04-02 MED ORDER — THROMBIN 20000 UNITS EX SOLR
CUTANEOUS | Status: DC | PRN
  Administered 2018-04-02: 20000 [IU] via TOPICAL

## 2018-04-02 MED ORDER — ALUM & MAG HYDROXIDE-SIMETH 200-200-20 MG/5ML PO SUSP: 30.0000 mL | Freq: Four times a day (QID) | ORAL | Status: DC | PRN

## 2018-04-02 MED ORDER — INSULIN GLARGINE 100 UNIT/ML ~~LOC~~ SOLN
54.0000 [IU] | Freq: Every day | SUBCUTANEOUS | Status: DC
  Filled 2018-04-02: qty 0.54

## 2018-04-02 MED ORDER — ROPINIROLE HCL 1 MG PO TABS
1.5000 mg | ORAL_TABLET | Freq: Every day | ORAL | Status: DC
  Administered 2018-04-02: 1.5 mg via ORAL
  Filled 2018-04-02: qty 2

## 2018-04-02 MED ORDER — FENTANYL CITRATE (PF) 100 MCG/2ML IJ SOLN
25.0000 ug | INTRAMUSCULAR | Status: DC | PRN
  Administered 2018-04-02: 25 ug via INTRAVENOUS

## 2018-04-02 MED ORDER — DULAGLUTIDE 1.5 MG/0.5ML ~~LOC~~ SOAJ: 1.5000 mg | SUBCUTANEOUS | Status: DC

## 2018-04-02 MED ORDER — LABETALOL HCL 5 MG/ML IV SOLN
INTRAVENOUS | Status: AC
  Filled 2018-04-02: qty 4

## 2018-04-02 MED ORDER — FENTANYL CITRATE (PF) 100 MCG/2ML IJ SOLN
INTRAMUSCULAR | Status: DC | PRN
  Administered 2018-04-02: 50 ug via INTRAVENOUS
  Administered 2018-04-02: 150 ug via INTRAVENOUS

## 2018-04-02 MED ORDER — ESMOLOL HCL 100 MG/10ML IV SOLN
INTRAVENOUS | Status: DC | PRN
  Administered 2018-04-02 (×3): 20 mg via INTRAVENOUS

## 2018-04-02 MED ORDER — OXYCODONE HCL 5 MG PO TABS
5.0000 mg | ORAL_TABLET | Freq: Once | ORAL | Status: AC | PRN
  Administered 2018-04-02: 5 mg via ORAL

## 2018-04-02 MED ORDER — BUPIVACAINE-EPINEPHRINE (PF) 0.25% -1:200000 IJ SOLN
INTRAMUSCULAR | Status: AC
  Filled 2018-04-02: qty 30

## 2018-04-02 MED ORDER — TRANDOLAPRIL-VERAPAMIL HCL ER 2-240 MG PO TBCR: 1.0000 | EXTENDED_RELEASE_TABLET | Freq: Every day | ORAL | Status: DC

## 2018-04-02 MED ORDER — ZOLPIDEM TARTRATE 5 MG PO TABS: 5.0000 mg | ORAL_TABLET | Freq: Every evening | ORAL | Status: DC | PRN

## 2018-04-02 MED ORDER — ONDANSETRON HCL 4 MG PO TABS: 4.0000 mg | ORAL_TABLET | Freq: Four times a day (QID) | ORAL | Status: DC | PRN

## 2018-04-02 MED ORDER — HEMOSTATIC AGENTS (NO CHARGE) OPTIME
TOPICAL | Status: DC | PRN
  Administered 2018-04-02: 1 via TOPICAL

## 2018-04-02 MED ORDER — BASAGLAR KWIKPEN 100 UNIT/ML ~~LOC~~ SOPN: 54.0000 [IU] | PEN_INJECTOR | Freq: Every day | SUBCUTANEOUS | Status: DC

## 2018-04-02 MED ORDER — LIDOCAINE 2% (20 MG/ML) 5 ML SYRINGE
INTRAMUSCULAR | Status: AC
  Filled 2018-04-02: qty 5

## 2018-04-02 MED ORDER — ACETAMINOPHEN 325 MG PO TABS: 650.0000 mg | ORAL_TABLET | ORAL | Status: DC | PRN

## 2018-04-02 MED ORDER — PROPOFOL 10 MG/ML IV BOLUS
INTRAVENOUS | Status: DC | PRN
  Administered 2018-04-02: 200 mg via INTRAVENOUS

## 2018-04-02 MED ORDER — MAGNESIUM OXIDE 400 (241.3 MG) MG PO TABS
400.0000 mg | ORAL_TABLET | Freq: Every day | ORAL | Status: DC
  Administered 2018-04-02: 400 mg via ORAL
  Filled 2018-04-02: qty 1

## 2018-04-02 SURGICAL SUPPLY — 77 items
BENZOIN TINCTURE PRP APPL 2/3 (GAUZE/BANDAGES/DRESSINGS) ×2 IMPLANT
BIT DRILL NEURO 2X3.1 SFT TUCH (MISCELLANEOUS) ×1 IMPLANT
BIT DRILL SKYLINE 12MM (BIT) ×1 IMPLANT
BLADE CLIPPER SURG (BLADE) IMPLANT
BLADE SURG 15 STRL LF DISP TIS (BLADE) ×1 IMPLANT
BLADE SURG 15 STRL SS (BLADE) ×1
BONE VIVIGEN FORMABLE 1.3CC (Bone Implant) ×2 IMPLANT
BUR MATCHSTICK NEURO 3.0 LAGG (BURR) IMPLANT
CARTRIDGE OIL MAESTRO DRILL (MISCELLANEOUS) ×1 IMPLANT
COLLAR CERV LO CONTOUR FIRM DE (SOFTGOODS) IMPLANT
CORDS BIPOLAR (ELECTRODE) IMPLANT
COVER SURGICAL LIGHT HANDLE (MISCELLANEOUS) IMPLANT
COVER WAND RF STERILE (DRAPES) ×2 IMPLANT
CRADLE DONUT ADULT HEAD (MISCELLANEOUS) ×2 IMPLANT
DECANTER SPIKE VIAL GLASS SM (MISCELLANEOUS) ×2 IMPLANT
DIFFUSER DRILL AIR PNEUMATIC (MISCELLANEOUS) ×2 IMPLANT
DRAIN JACKSON RD 7FR 3/32 (WOUND CARE) IMPLANT
DRAPE C-ARM 42X72 X-RAY (DRAPES) ×4 IMPLANT
DRAPE POUCH INSTRU U-SHP 10X18 (DRAPES) IMPLANT
DRAPE SURG 17X23 STRL (DRAPES) ×8 IMPLANT
DRILL BIT SKYLINE 12MM (BIT) ×1
DRILL NEURO 2X3.1 SOFT TOUCH (MISCELLANEOUS) ×2
DURAPREP 26ML APPLICATOR (WOUND CARE) ×2 IMPLANT
ELECT COATED BLADE 2.86 ST (ELECTRODE) ×2 IMPLANT
ELECT REM PT RETURN 9FT ADLT (ELECTROSURGICAL) ×2
ELECTRODE REM PT RTRN 9FT ADLT (ELECTROSURGICAL) ×1 IMPLANT
EVACUATOR SILICONE 100CC (DRAIN) IMPLANT
GAUZE 4X4 16PLY RFD (DISPOSABLE) IMPLANT
GAUZE SPONGE 4X4 12PLY STRL (GAUZE/BANDAGES/DRESSINGS) ×2 IMPLANT
GAUZE SPONGE 4X4 12PLY STRL LF (GAUZE/BANDAGES/DRESSINGS) ×2 IMPLANT
GLOVE BIO SURGEON STRL SZ7 (GLOVE) ×4 IMPLANT
GLOVE BIO SURGEON STRL SZ8 (GLOVE) ×2 IMPLANT
GLOVE BIOGEL PI IND STRL 7.0 (GLOVE) ×3 IMPLANT
GLOVE BIOGEL PI IND STRL 7.5 (GLOVE) ×1 IMPLANT
GLOVE BIOGEL PI IND STRL 8 (GLOVE) ×1 IMPLANT
GLOVE BIOGEL PI INDICATOR 7.0 (GLOVE) ×3
GLOVE BIOGEL PI INDICATOR 7.5 (GLOVE) ×1
GLOVE BIOGEL PI INDICATOR 8 (GLOVE) ×1
GOWN STRL REUS W/ TWL LRG LVL3 (GOWN DISPOSABLE) ×3 IMPLANT
GOWN STRL REUS W/ TWL XL LVL3 (GOWN DISPOSABLE) ×2 IMPLANT
GOWN STRL REUS W/TWL LRG LVL3 (GOWN DISPOSABLE) ×3
GOWN STRL REUS W/TWL XL LVL3 (GOWN DISPOSABLE) ×2
INTERLOCK LRDTC CRVCL VBR 6MM (Bone Implant) ×2 IMPLANT
IV CATH 14GX2 1/4 (CATHETERS) ×2 IMPLANT
KIT BASIN OR (CUSTOM PROCEDURE TRAY) ×2 IMPLANT
KIT TURNOVER KIT B (KITS) ×2 IMPLANT
LORDOTIC CERVICAL VBR 6MM SM (Bone Implant) ×4 IMPLANT
MANIFOLD NEPTUNE II (INSTRUMENTS) IMPLANT
NEEDLE PRECISIONGLIDE 27X1.5 (NEEDLE) ×2 IMPLANT
NEEDLE SPNL 20GX3.5 QUINCKE YW (NEEDLE) ×2 IMPLANT
NS IRRIG 1000ML POUR BTL (IV SOLUTION) ×4 IMPLANT
OIL CARTRIDGE MAESTRO DRILL (MISCELLANEOUS) ×2
PACK LAMINECTOMY NEURO (CUSTOM PROCEDURE TRAY) ×2 IMPLANT
PACK ORTHO CERVICAL (CUSTOM PROCEDURE TRAY) IMPLANT
PAD ARMBOARD 7.5X6 YLW CONV (MISCELLANEOUS) ×4 IMPLANT
PATTIES SURGICAL .5 X.5 (GAUZE/BANDAGES/DRESSINGS) IMPLANT
PATTIES SURGICAL .5 X1 (DISPOSABLE) ×2 IMPLANT
PIN DISTRACTION 14 (PIN) ×4 IMPLANT
PLATE SKYLINE TWO LEVEL 28MM (Plate) ×2 IMPLANT
SCREW SKYLINE VARIABLE LG (Screw) ×12 IMPLANT
SPONGE INTESTINAL PEANUT (DISPOSABLE) ×4 IMPLANT
SPONGE SURGIFOAM ABS GEL 100 (HEMOSTASIS) ×2 IMPLANT
STRIP CLOSURE SKIN 1/2X4 (GAUZE/BANDAGES/DRESSINGS) ×2 IMPLANT
SURGIFLO W/THROMBIN 8M KIT (HEMOSTASIS) IMPLANT
SUT MNCRL AB 4-0 PS2 18 (SUTURE) ×2 IMPLANT
SUT SILK 4 0 (SUTURE)
SUT SILK 4-0 18XBRD TIE 12 (SUTURE) IMPLANT
SUT VIC AB 2-0 CT2 18 VCP726D (SUTURE) ×2 IMPLANT
SYR BULB IRRIGATION 50ML (SYRINGE) ×2 IMPLANT
SYR CONTROL 10ML LL (SYRINGE) ×6 IMPLANT
TAPE CLOTH 4X10 WHT NS (GAUZE/BANDAGES/DRESSINGS) ×2 IMPLANT
TAPE CLOTH SURG 4X10 WHT LF (GAUZE/BANDAGES/DRESSINGS) ×2 IMPLANT
TAPE UMBILICAL COTTON 1/8X30 (MISCELLANEOUS) ×2 IMPLANT
TOWEL OR 17X24 6PK STRL BLUE (TOWEL DISPOSABLE) ×2 IMPLANT
TOWEL OR 17X26 10 PK STRL BLUE (TOWEL DISPOSABLE) ×2 IMPLANT
WATER STERILE IRR 1000ML POUR (IV SOLUTION) ×2 IMPLANT
YANKAUER SUCT BULB TIP NO VENT (SUCTIONS) ×2 IMPLANT

## 2018-04-02 NOTE — Anesthesia Procedure Notes (Signed)
Procedure Name: Intubation Date/Time: 04/02/2018 1:28 PM Performed by: Gwyndolyn Saxon, CRNA Pre-anesthesia Checklist: Patient identified, Emergency Drugs available, Suction available and Patient being monitored Patient Re-evaluated:Patient Re-evaluated prior to induction Oxygen Delivery Method: Circle system utilized Preoxygenation: Pre-oxygenation with 100% oxygen Induction Type: IV induction Ventilation: Mask ventilation without difficulty Laryngoscope Size: Miller and 2 Grade View: Grade I Tube type: Oral Tube size: 7.0 mm Number of attempts: 1 Airway Equipment and Method: Patient positioned with wedge pillow and Stylet Placement Confirmation: ETT inserted through vocal cords under direct vision,  positive ETCO2 and breath sounds checked- equal and bilateral Secured at: 19 cm Tube secured with: Tape Dental Injury: Teeth and Oropharynx as per pre-operative assessment

## 2018-04-02 NOTE — H&P (Signed)
PREOPERATIVE H&P  Chief Complaint: Right arm pain  HPI: Tonya Chandler is a 58 y.o. female who presents with ongoing pain in the right arm  MRI reveals severe stenosis at C4/5 and C5/6  Patient has failed multiple forms of conservative care and continues to have pain (see office notes for additional details regarding the patient's full course of treatment)  Past Medical History:  Diagnosis Date  . Anxiety   . Depression   . Diabetes mellitus   . Gastro-esophageal reflux   . Hyperlipidemia   . Hypertension    Past Surgical History:  Procedure Laterality Date  . ABDOMINAL HYSTERECTOMY    . PIP JOINT FUSION Bilateral    pinky   Social History   Socioeconomic History  . Marital status: Married    Spouse name: Not on file  . Number of children: Not on file  . Years of education: Not on file  . Highest education level: Not on file  Occupational History  . Not on file  Social Needs  . Financial resource strain: Not on file  . Food insecurity:    Worry: Not on file    Inability: Not on file  . Transportation needs:    Medical: Not on file    Non-medical: Not on file  Tobacco Use  . Smoking status: Never Smoker  . Smokeless tobacco: Never Used  Substance and Sexual Activity  . Alcohol use: No  . Drug use: No  . Sexual activity: Yes  Lifestyle  . Physical activity:    Days per week: Not on file    Minutes per session: Not on file  . Stress: Not on file  Relationships  . Social connections:    Talks on phone: Not on file    Gets together: Not on file    Attends religious service: Not on file    Active member of club or organization: Not on file    Attends meetings of clubs or organizations: Not on file    Relationship status: Not on file  Other Topics Concern  . Not on file  Social History Narrative  . Not on file   Family History  Problem Relation Age of Onset  . Diabetes Mother   . Dementia Mother   . Heart attack Father    Allergies  Allergen  Reactions  . Adhesive [Tape] Hives    Welts--PLEASE USE HYPO ALLERGIC TAPE  . Ampicillin Diarrhea    Did it involve swelling of the face/tongue/throat, SOB, or low BP? No Did it involve sudden or severe rash/hives, skin peeling, or any reaction on the inside of your mouth or nose? No Did you need to seek medical attention at a hospital or doctor's office? No When did it last happen?30-40 years ago If all above answers are "NO", may proceed with cephalosporin use.   Marland Kitchen Cefprozil Other (See Comments)    Acid reflux/indigestion issues  . Meloxicam Other (See Comments)    Acid reflux/indigestion issues   Prior to Admission medications   Medication Sig Start Date End Date Taking? Authorizing Provider  acetaminophen (TYLENOL) 500 MG tablet Take 1,000 mg by mouth every 6 (six) hours as needed (for pain.).   Yes [provider]  Cyanocobalamin (VITAMIN B-12) 2500 MCG SUBL Place 2,500 mcg under the tongue daily.   Yes [provider]  cyclobenzaprine (FLEXERIL) 10 MG tablet Take one tab by mouth 2 or 3 times daily for muscle spasm. Patient taking differently: Take 10 mg  by mouth at bedtime.  07/22/17  Yes Silverio Decamp, MD  Dulaglutide (TRULICITY) 1.5 GX/2.1JH SOPN Inject 1.5 mg into the skin every Sunday.   Yes [provider]  DULoxetine (CYMBALTA) 60 MG capsule Take 60 mg by mouth daily. 02/10/18  Yes [provider]  estradiol (ESTRACE) 1 MG tablet Take 1 mg by mouth at bedtime.  12/12/17  Yes [provider]  famotidine (PEPCID) 20 MG tablet Take 20 mg by mouth daily before breakfast.   Yes [provider]  gabapentin (NEURONTIN) 800 MG tablet 1 tab in the morning, 1 tab midday, 2 tabs at bedtime 03/31/18  Yes Silverio Decamp, MD  Insulin Glargine (BASAGLAR KWIKPEN) 100 UNIT/ML SOPN Inject 54 Units into the skin daily.  10/29/17  Yes [provider]  INVOKANA 300 MG TABS tablet Take 300 mg by mouth daily. 03/23/18   Yes [provider]  magnesium oxide (MAG-OX) 400 MG tablet Take 400 mg by mouth at bedtime.   Yes [provider]  mirtazapine (REMERON) 45 MG tablet Take 45 mg by mouth at bedtime.  11/20/17  Yes [provider]  rOPINIRole (REQUIP) 0.5 MG tablet Take 1.5 mg by mouth at bedtime. 02/11/18  Yes [provider]  trandolapril-verapamil (TARKA) 2-240 MG per tablet Take 1 tablet by mouth daily.   Yes [provider]  triamterene-hydrochlorothiazide (MAXZIDE-25) 37.5-25 MG tablet Take 1 tablet by mouth daily. 02/27/18  Yes [provider]  aspirin 325 MG tablet Take 650 mg by mouth daily.    [provider]     All other systems have been reviewed and were otherwise negative with the exception of those mentioned in the HPI and as above.  Physical Exam: Vitals:   04/02/18 1021  BP: (!) 166/68  Pulse: 88  Resp: 20  Temp: 97.7 F (36.5 C)  SpO2: 94%    Body mass index is 30.27 kg/m.  General: Alert, no acute distress Cardiovascular: No pedal edema Respiratory: No cyanosis, no use of accessory musculature Skin: No lesions in the area of chief complaint Neurologic: Sensation intact distally Psychiatric: Patient is competent for consent with normal mood and affect Lymphatic: No axillary or cervical lymphadenopathy  MUSCULOSKELETAL: + spurling's sign on the right  Assessment/Plan: RIGHT - SIDED CERVICAL RADICULOPATHY Plan for Procedure(s): ANTERIOR CERVICAL DECOMPRESSION FUSION, CERVICAL 4-5, CERVICAL 5-6, WITH INSTRUMENTATION AND ALLOGRAFT   Norva Karvonen, MD 04/02/2018 12:53 PM

## 2018-04-02 NOTE — Progress Notes (Signed)
Patient's CBG 251.  Dr. Marcie Bal notified.  He does not want to treat at this time.  Will continue to monitor patient.

## 2018-04-02 NOTE — Transfer of Care (Signed)
Immediate Anesthesia Transfer of Care Note  Patient: Tonya Chandler  Procedure(s) Performed: ANTERIOR CERVICAL DECOMPRESSION FUSION, CERVICAL FOUR-FIVE, CERVICAL FIVE-SIX, WITH INSTRUMENTATION AND ALLOGRAFT (N/A )  Patient Location: PACU  Anesthesia Type:General  Level of Consciousness: drowsy and patient cooperative  Airway & Oxygen Therapy: Patient Spontanous Breathing and Patient connected to nasal cannula oxygen  Post-op Assessment: Report given to RN and Post -op Vital signs reviewed and stable  Post vital signs: Reviewed and stable  Last Vitals:  Vitals Value Taken Time  BP 151/85 04/02/2018  3:36 PM  Temp    Pulse 90 04/02/2018  3:40 PM  Resp 12 04/02/2018  3:40 PM  SpO2 96 % 04/02/2018  3:40 PM  Vitals shown include unvalidated device data.  Last Pain:  Vitals:   04/02/18 1103  TempSrc:   PainSc: 4       Patients Stated Pain Goal: 3 (94/[id number]/54 [id number])  Complications: No apparent anesthesia complications

## 2018-04-02 NOTE — Op Note (Signed)
PATIENT NAME: Tonya Chandler   MEDICAL RECORD NO.:   [id number]    PHYSICIAN:  Phylliss Bob, MD      DATE OF BIRTH: 06/16/1960   DATE OF PROCEDURE: 04/02/2018                               OPERATIVE REPORT     PREOPERATIVE DIAGNOSES: 1. Left-sided cervical radiculopathy. 2. Spinal stenosis spanning C4-C6   POSTOPERATIVE DIAGNOSES: 1. Left-sided cervical radiculopathy. 2. Spinal stenosis spanning C4-C6   PROCEDURE: 1. Anterior cervical decompression and fusion C4/5, C5/6 2. Placement of anterior instrumentation, C4-C6 3. Insertion of interbody device x 2 (Titan intervertebral spacers). 4. Intraoperative use of fluoroscopy. 5. Use of morselized allograft - ViviGen.   SURGEON:  Phylliss Bob, MD   ASSISTANT:  Pricilla Holm, PA-C.   ANESTHESIA:  General endotracheal anesthesia.   COMPLICATIONS:  None.   DISPOSITION:  Stable.   ESTIMATED BLOOD LOSS:  Minimal.   INDICATIONS FOR SURGERY:  Briefly, Tonya Chandler is a pleasant 58 y.o. year- old patient, who did present to me with severe pain in the neck, also with numbness in her right arm.  [name] MRI did reveal the findings noted above.  Given the patient's ongoing rather debilitating pain and lack of improvement with appropriate treatment measures, we did discuss proceeding with the procedure noted above.  The patient was fully aware of the risks and limitations of surgery as outlined in my preoperative note.   OPERATIVE DETAILS:  On 04/02/2018  the patient was brought to surgery and general endotracheal anesthesia was administered.  The patient was placed supine on the hospital bed. The neck was gently extended.  All bony prominences were meticulously padded.  The neck was prepped and draped in the usual sterile fashion.  At this point, I did make a left-sided transverse incision.  The platysma was incised.  A Smith-Robinson approach was used and the anterior spine was identified. A self-retaining retractor was placed.  I  then subperiosteally exposed the vertebral bodies from C4-C6.  Caspar pins were then placed into the C5 and C6 vertebral bodies and distraction was applied.  A thorough and complete C5-6 intervertebral diskectomy was performed.  The posterior longitudinal ligament was identified and entered using a nerve hook.  I then used #1 followed by #2 Kerrison to perform a thorough and complete intervertebral diskectomy.  The spinal canal was thoroughly decompressed, as was the right and left neuroforamen.  The endplates were then prepared and the appropriate-sized intervertebral spacer was then packed with ViviGen and tamped into position in the usual fashion.  The lower Caspar pin was then removed and placed into the C4 vertebral body and once again, distraction was applied across the C4-5 intervertebral space.  I then again performed a thorough and complete diskectomy, thoroughly decompressing the spinal canal and bilateral neuroforamena.  After preparing the endplates, the appropriate-sized intervertebral spacer was packed with ViviGen and tamped into position. The Caspar pins then were removed and bone wax was placed in their place.  The appropriate-sized anterior cervical plate was placed over the anterior spine.  12 mm variable angle screws were placed, 2 in each vertebral body from C4-C6 for a total of 6 vertebral body screws.  The screws were then locked to the plate using the Cam locking mechanism.  I was very pleased with the final fluoroscopic images.  The wound was then irrigated.  The wound was then explored  for any undue bleeding and there was no bleeding noted. The wound was then closed in layers using 2-0 Vicryl, followed by 4-0 Monocryl.  Benzoin and Steri-Strips were applied, followed by sterile dressing.  All instrument counts were correct at the termination of the procedure.   Of note, Pricilla Holm, PA-C, was my assistant throughout surgery, and did aid in retraction,  suctioning, and closure from start to finish.     Phylliss Bob, MD

## 2018-04-02 NOTE — Progress Notes (Signed)
Pharmacy Antibiotic Note  Tonya Chandler is a 58 y.o. female admitted on 04/02/2018 with surgical prophylaxis.  Pharmacy has been consulted for vanc dosing.  S/p spinal decompression and fusion. No drain in place. Will only need 1 post op dose of vanc.  Plan: Vanc 1g IV x1  Height: 5' (152.4 cm) Weight: 155 lb (70.3 kg) IBW/kg (Calculated) : 45.5  Temp (24hrs), Avg:97.8 F (36.6 C), Min:97.7 F (36.5 C), Max:97.9 F (36.6 C)  Recent Labs  Lab 03/30/18 0924  WBC 8.8  CREATININE 0.57    Estimated Creatinine Clearance: 66.2 mL/min (by C-G formula based on SCr of 0.57 mg/dL).    Allergies  Allergen Reactions  . Adhesive [Tape] Hives    Welts--PLEASE USE HYPO ALLERGIC TAPE  . Ampicillin Diarrhea    Did it involve swelling of the face/tongue/throat, SOB, or low BP? No Did it involve sudden or severe rash/hives, skin peeling, or any reaction on the inside of your mouth or nose? No Did you need to seek medical attention at a hospital or doctor's office? No When did it last happen?30-40 years ago If all above answers are "NO", may proceed with cephalosporin use.   Marland Kitchen Cefprozil Other (See Comments)    Acid reflux/indigestion issues  . Meloxicam Other (See Comments)    Acid reflux/indigestion issues    Onnie Boer, PharmD, BCIDP, AAHIVP, CPP Infectious Disease Pharmacist 04/02/2018 5:16 PM

## 2018-04-03 ENCOUNTER — Encounter (HOSPITAL_COMMUNITY): Payer: Self-pay | Admitting: Orthopedic Surgery

## 2018-04-03 DIAGNOSIS — Z79899 Other long term (current) drug therapy: Secondary | ICD-10-CM | POA: Diagnosis not present

## 2018-04-03 DIAGNOSIS — F329 Major depressive disorder, single episode, unspecified: Secondary | ICD-10-CM | POA: Diagnosis not present

## 2018-04-03 DIAGNOSIS — M4802 Spinal stenosis, cervical region: Secondary | ICD-10-CM | POA: Diagnosis not present

## 2018-04-03 DIAGNOSIS — E119 Type 2 diabetes mellitus without complications: Secondary | ICD-10-CM | POA: Diagnosis not present

## 2018-04-03 DIAGNOSIS — F419 Anxiety disorder, unspecified: Secondary | ICD-10-CM | POA: Diagnosis not present

## 2018-04-03 DIAGNOSIS — M5412 Radiculopathy, cervical region: Secondary | ICD-10-CM | POA: Diagnosis not present

## 2018-04-03 DIAGNOSIS — I10 Essential (primary) hypertension: Secondary | ICD-10-CM | POA: Diagnosis not present

## 2018-04-03 DIAGNOSIS — Z794 Long term (current) use of insulin: Secondary | ICD-10-CM | POA: Diagnosis not present

## 2018-04-03 DIAGNOSIS — Z79890 Hormone replacement therapy (postmenopausal): Secondary | ICD-10-CM | POA: Diagnosis not present

## 2018-04-03 DIAGNOSIS — Z7982 Long term (current) use of aspirin: Secondary | ICD-10-CM | POA: Diagnosis not present

## 2018-04-03 LAB — GLUCOSE, CAPILLARY: Glucose-Capillary: 196 mg/dL — ABNORMAL HIGH (ref 70–99)

## 2018-04-03 MED ORDER — DIAZEPAM 5 MG PO TABS: 5.0000 mg | ORAL_TABLET | Freq: Four times a day (QID) | ORAL | 0 refills | Status: DC | PRN

## 2018-04-03 MED ORDER — OXYCODONE-ACETAMINOPHEN 5-325 MG PO TABS: 1.0000 | ORAL_TABLET | ORAL | 0 refills | Status: DC | PRN

## 2018-04-03 NOTE — Progress Notes (Signed)
    Patient doing well  Denies arm pain Tolerating PO well   Physical Exam: Vitals:   04/02/18 2331 04/03/18 0307  BP: (!) 162/99 (!) 171/96  Pulse: 94 84  Resp: 18 18  Temp: 97.9 F (36.6 C) 97.7 F (36.5 C)  SpO2: 95% 94%    Neck soft/supple Dressing in place NVI  POD #1 s/p C4-6 ACDF, doing well  - encourage ambulation - Percocet for pain, Valium for muscle spasms - d/c home today with f/u in 2 weeks

## 2018-04-03 NOTE — Progress Notes (Signed)
Inpatient Diabetes Program Recommendations  AACE/ADA: New Consensus Statement on Inpatient Glycemic Control (2015)  Target Ranges:  Prepandial:   less than 140 mg/dL      Peak postprandial:   less than 180 mg/dL (1-2 hours)      Critically ill patients:  140 - 180 mg/dL   Lab Results  Component Value Date   GLUCAP 196 (H) 04/03/2018   HGBA1C 9.0 (H) 03/30/2018    Review of Glycemic Control  Inpatient Diabetes Program Recommendations:   Spoke with Tonya Chandler and husband @ bedside about A1C 9.0 (average CBG 212 over the past 2-3 months) results with them and explained what an A1C is, basic pathophysiology of DM Type 2, basic home care, basic diabetes diet nutrition principles, importance of checking CBGs and maintaining good CBG control to prevent long-term and short-term complications. Reviewed signs and symptoms of hyperglycemia and hypoglycemia and how to treat hypoglycemia at home. Also reviewed blood sugar goals at home.   Reviewed basic plate method nutrition guidelines with patient and answered questions.  Patient has signed and been given consent for CGM/[employer] [place] research study.  Patient has been assigned to control group and will not receive CGM at d/c. Diabetes Quality of Life survey administered.  Patient understands that diabetes coordinator will call them after discharge between days 30-35 after d/c from hospital.  Thank you, [name]. Yena Tisby, RN, MSN, CDE  Diabetes Coordinator Inpatient Glycemic Control Team Team Pager [phone] (8am-5pm) 04/03/2018 9:29 AM

## 2018-04-03 NOTE — Progress Notes (Signed)
Patient is discharged from room 3C03 at this time. Alert and in stable condition. IV site d/c'[d and instructions read to patient and spouse with understanding verbalized. Left unit via wheelchair with all belongings at side. 

## 2018-04-03 NOTE — Anesthesia Postprocedure Evaluation (Signed)
Anesthesia Post Note  Patient: Tonya Chandler  Procedure(s) Performed: ANTERIOR CERVICAL DECOMPRESSION FUSION, CERVICAL FOUR-FIVE, CERVICAL FIVE-SIX, WITH INSTRUMENTATION AND ALLOGRAFT (N/A )     Patient location during evaluation: PACU Anesthesia Type: General Level of consciousness: awake and alert Pain management: pain level controlled Vital Signs Assessment: post-procedure vital signs reviewed and stable Respiratory status: spontaneous breathing, nonlabored ventilation, respiratory function stable and patient connected to nasal cannula oxygen Cardiovascular status: blood pressure returned to baseline and stable Postop Assessment: no apparent nausea or vomiting Anesthetic complications: no    Last Vitals:  Vitals:   04/03/18 0307 04/03/18 0726  BP: (!) 171/96 (!) 153/78  Pulse: 84 83  Resp: 18 16  Temp: 36.5 C 36.6 C  SpO2: 94% 94%    Last Pain:  Vitals:   04/03/18 0726  TempSrc: Oral  PainSc:                  Henefer S

## 2018-04-06 ENCOUNTER — Ambulatory Visit: Payer: Self-pay | Admitting: Sports Medicine

## 2018-04-07 DIAGNOSIS — M47812 Spondylosis without myelopathy or radiculopathy, cervical region: Secondary | ICD-10-CM | POA: Diagnosis not present

## 2018-04-09 NOTE — Discharge Summary (Signed)
Patient ID: Tonya Chandler MRN: [id number] DOB/AGE: November 26, 1960 58 y.o.  Admit date: 04/02/2018 Discharge date: 04/03/2018  Admission Diagnoses:  Active Problems:   Radiculopathy   Discharge Diagnoses:  Same  Past Medical History:  Diagnosis Date  . Anxiety   . Depression   . Diabetes mellitus   . Gastro-esophageal reflux   . Hyperlipidemia   . Hypertension     Surgeries: Procedure(s): ANTERIOR CERVICAL DECOMPRESSION FUSION, CERVICAL FOUR-FIVE, CERVICAL FIVE-SIX, WITH INSTRUMENTATION AND ALLOGRAFT on 04/02/2018   Consultants: None  Discharged Condition: Improved  Hospital Course: Tonya Chandler is an 58 y.o. female who was admitted 04/02/2018 for operative treatment of radiculopathy. Patient has severe unremitting pain that affects sleep, daily activities, and work/hobbies. After pre-op clearance the patient was taken to the operating room on 04/02/2018 and underwent  Procedure(s): ANTERIOR CERVICAL DECOMPRESSION FUSION, CERVICAL FOUR-FIVE, CERVICAL FIVE-SIX, WITH INSTRUMENTATION AND ALLOGRAFT.    Patient was given perioperative antibiotics:  Anti-infectives (From admission, onward)   Start     Dose/Rate Route Frequency Ordered Stop   04/03/18 0200  vancomycin (VANCOCIN) IVPB 1000 mg/200 mL premix     1,000 mg 200 mL/hr over 60 Minutes Intravenous  Once 04/02/18 1717 04/03/18 0350   04/03/18 0000  vancomycin (VANCOCIN) IVPB 1000 mg/200 mL premix  Status:  Discontinued     1,000 mg 200 mL/hr over 60 Minutes Intravenous  Once 04/02/18 1718 04/02/18 1719   04/02/18 1030  vancomycin (VANCOCIN) IVPB 1000 mg/200 mL premix     1,000 mg 200 mL/hr over 60 Minutes Intravenous On call to O.R. 04/02/18 1029 04/02/18 1717       Patient was given sequential compression devices, early ambulation to prevent DVT.  Patient benefited maximally from hospital stay and there were no complications.    Recent vital signs: BP (!) 153/78 (BP Location: Right Arm)   Pulse 83   Temp 97.8 F (36.6 C)  (Oral)   Resp 16   Ht 5' (1.524 m)   Wt 70.3 kg   SpO2 94%   BMI 30.27 kg/m    Discharge Medications:   Allergies as of 04/03/2018      Reactions   Adhesive [tape] Hives   Welts--PLEASE USE HYPO ALLERGIC TAPE   Ampicillin Diarrhea   Did it involve swelling of the face/tongue/throat, SOB, or low BP? No Did it involve sudden or severe rash/hives, skin peeling, or any reaction on the inside of your mouth or nose? No Did you need to seek medical attention at a hospital or doctor's office? No When did it last happen?30-40 years ago If all above answers are "NO", may proceed with cephalosporin use.   Cefprozil Other (See Comments)   Acid reflux/indigestion issues   Meloxicam Other (See Comments)   Acid reflux/indigestion issues      Medication List    STOP taking these medications   acetaminophen 500 MG tablet Commonly known as:  TYLENOL     TAKE these medications   Basaglar KwikPen 100 UNIT/ML Sopn Inject 54 Units into the skin daily.   diazepam 5 MG tablet Commonly known as:  VALIUM Take 1 tablet (5 mg total) by mouth every 6 (six) hours as needed for muscle spasms.   DULoxetine 60 MG capsule Commonly known as:  CYMBALTA Take 60 mg by mouth daily.   estradiol 1 MG tablet Commonly known as:  ESTRACE Take 1 mg by mouth at bedtime.   famotidine 20 MG tablet Commonly known as:  PEPCID Take 20 mg by  mouth daily before breakfast.   gabapentin 800 MG tablet Commonly known as:  Neurontin 1 tab in the morning, 1 tab midday, 2 tabs at bedtime   Invokana 300 MG Tabs tablet Generic drug:  canagliflozin Take 300 mg by mouth daily.   magnesium oxide 400 MG tablet Commonly known as:  MAG-OX Take 400 mg by mouth at bedtime.   mirtazapine 45 MG tablet Commonly known as:  REMERON Take 45 mg by mouth at bedtime.   oxyCODONE-acetaminophen 5-325 MG tablet Commonly known as:  PERCOCET/ROXICET Take 1-2 tablets by mouth every 4 (four) hours as needed for moderate pain  or severe pain.   rOPINIRole 0.5 MG tablet Commonly known as:  REQUIP Take 1.5 mg by mouth at bedtime.   trandolapril-verapamil 2-240 MG tablet Commonly known as:  TARKA Take 1 tablet by mouth daily.   triamterene-hydrochlorothiazide 37.5-25 MG tablet Commonly known as:  MAXZIDE-25 Take 1 tablet by mouth daily.   Trulicity 1.5 VF/6.4PP Sopn Generic drug:  Dulaglutide Inject 1.5 mg into the skin every Sunday.   Vitamin B-12 2500 MCG Subl Place 2,500 mcg under the tongue daily.       Diagnostic Studies: Dg Cervical Spine 2-3 Views  Result Date: 04/02/2018 CLINICAL DATA:  Operative imaging for anterior cervical disc fusion. EXAM: CERVICAL SPINE - 2-3 VIEW; DG C-ARM 61-120 MIN COMPARISON:  Cervical MRI, 01/26/2018 FINDINGS: Two submitted images show placement of an anterior fusion plate and associated fixation screws, spanning C4 through C6. Intervertebral metal disc spacers are well centered at the C4-C5 and C5-C6 levels. Orthopedic hardware appears well seated and positioned. IMPRESSION: Well-positioned orthopedic hardware fusing C4 through C6. Electronically Signed   By: Lajean Manes M.D.   On: 04/02/2018 15:32   Dg C-arm 1-60 Min  Result Date: 04/02/2018 CLINICAL DATA:  Operative imaging for anterior cervical disc fusion. EXAM: CERVICAL SPINE - 2-3 VIEW; DG C-ARM 61-120 MIN COMPARISON:  Cervical MRI, 01/26/2018 FINDINGS: Two submitted images show placement of an anterior fusion plate and associated fixation screws, spanning C4 through C6. Intervertebral metal disc spacers are well centered at the C4-C5 and C5-C6 levels. Orthopedic hardware appears well seated and positioned. IMPRESSION: Well-positioned orthopedic hardware fusing C4 through C6. Electronically Signed   By: Lajean Manes M.D.   On: 04/02/2018 15:32    Disposition:    POD #1 s/p C4-6 ACDF, doing well  - encourage ambulation - Percocet for pain, Valium for muscle spasms - d/c home today with f/u in 2 weeks    Signed: Justice Britain 04/09/2018, 12:10 PM

## 2018-04-15 DIAGNOSIS — Z9889 Other specified postprocedural states: Secondary | ICD-10-CM | POA: Diagnosis not present

## 2018-05-06 ENCOUNTER — Telehealth: Payer: Self-pay

## 2018-05-26 DIAGNOSIS — Z9889 Other specified postprocedural states: Secondary | ICD-10-CM | POA: Diagnosis not present

## 2018-06-09 ENCOUNTER — Other Ambulatory Visit: Payer: Self-pay

## 2018-06-09 ENCOUNTER — Ambulatory Visit (INDEPENDENT_AMBULATORY_CARE_PROVIDER_SITE_OTHER): Payer: BLUE CROSS/BLUE SHIELD | Admitting: Sports Medicine

## 2018-06-09 ENCOUNTER — Ambulatory Visit (INDEPENDENT_AMBULATORY_CARE_PROVIDER_SITE_OTHER): Payer: BLUE CROSS/BLUE SHIELD

## 2018-06-09 DIAGNOSIS — M79642 Pain in left hand: Secondary | ICD-10-CM | POA: Diagnosis not present

## 2018-06-09 DIAGNOSIS — M79641 Pain in right hand: Secondary | ICD-10-CM | POA: Diagnosis not present

## 2018-06-09 DIAGNOSIS — R7989 Other specified abnormal findings of blood chemistry: Secondary | ICD-10-CM | POA: Diagnosis not present

## 2018-06-09 NOTE — Progress Notes (Addendum)
Subjective:    CC: Right hand pain  HPI: This is a pleasant 58 year old female, we have treated her for multiple osteoarthritic processes, she is post cervical ACDF, more recently she is had increasing pain, swelling in the right fourth PIP, she does have a brother with a history of rheumatoid arthritis.  Pain is moderate, persistent, localized without radiation.  She has been historically avoiding NSAIDs due to a suspected cardiac process, on further questioning she has no symptoms, she had a catheterization years ago with nonhemodynamically significant stenosis, she also has a recent stress test that was negative.  I reviewed the past medical history, family history, social history, surgical history, and allergies today and no changes were needed.  Please see the problem list section below in epic for further details.  Past Medical History: Past Medical History:  Diagnosis Date  . Anxiety   . Depression   . Diabetes mellitus   . Gastro-esophageal reflux   . Hyperlipidemia   . Hypertension    Past Surgical History: Past Surgical History:  Procedure Laterality Date  . ABDOMINAL HYSTERECTOMY    . ANTERIOR CERVICAL DECOMP/DISCECTOMY FUSION N/A 04/02/2018   Procedure: ANTERIOR CERVICAL DECOMPRESSION FUSION, CERVICAL FOUR-FIVE, CERVICAL FIVE-SIX, WITH INSTRUMENTATION AND ALLOGRAFT;  Surgeon: Phylliss Bob, MD;  Location: [facility];  Service: Orthopedics;  Laterality: N/A;  . PIP JOINT FUSION Bilateral    pinky   Social History: Social History   Socioeconomic History  . Marital status: Married    Spouse name: Not on file  . Number of children: Not on file  . Years of education: Not on file  . Highest education level: Not on file  Occupational History  . Not on file  Social Needs  . Financial resource strain: Not on file  . Food insecurity:    Worry: Not on file    Inability: Not on file  . Transportation needs:    Medical: Not on file    Non-medical: Not on file  Tobacco Use   . Smoking status: Never Smoker  . Smokeless tobacco: Never Used  Substance and Sexual Activity  . Alcohol use: No  . Drug use: No  . Sexual activity: Yes  Lifestyle  . Physical activity:    Days per week: Not on file    Minutes per session: Not on file  . Stress: Not on file  Relationships  . Social connections:    Talks on phone: Not on file    Gets together: Not on file    Attends religious service: Not on file    Active member of club or organization: Not on file    Attends meetings of clubs or organizations: Not on file    Relationship status: Not on file  Other Topics Concern  . Not on file  Social History Narrative  . Not on file   Family History: Family History  Problem Relation Age of Onset  . Diabetes Mother   . Dementia Mother   . Heart attack Father    Allergies: Allergies  Allergen Reactions  . Adhesive [Tape] Hives    Welts--PLEASE USE HYPO ALLERGIC TAPE  . Ampicillin Diarrhea    Did it involve swelling of the face/tongue/throat, SOB, or low BP? No Did it involve sudden or severe rash/hives, skin peeling, or any reaction on the inside of your mouth or nose? No Did you need to seek medical attention at a hospital or doctor's office? No When did it last happen?30-40 years ago If all above answers  are "NO", may proceed with cephalosporin use.   Marland Kitchen Cefprozil Other (See Comments)    Acid reflux/indigestion issues  . Meloxicam Other (See Comments)    Acid reflux/indigestion issues   Medications: See med rec.  Review of Systems: No fevers, chills, night sweats, weight loss, chest pain, or shortness of breath.   Objective:    General: Well Developed, well nourished, and in no acute distress.  Neuro: Alert and oriented x3, extra-ocular muscles intact, sensation grossly intact.  HEENT: Normocephalic, atraumatic, pupils equal round reactive to light, neck supple, no masses, no lymphadenopathy, thyroid nonpalpable.  Skin: Warm and dry, no rashes.  Cardiac: Regular rate and rhythm, no murmurs rubs or gallops, no lower extremity edema.  Respiratory: Clear to auscultation bilaterally. Not using accessory muscles, speaking in full sentences. Right hand: Tender swollen fourth PIP with synovitis.  Procedure: Real-time Ultrasound Guided injection of the right fourth PIP Device: GE Logiq E  Verbal informed consent obtained.  Time-out conducted.  Noted no overlying erythema, induration, or other signs of local infection.  Skin prepped in a sterile fashion.  Local anesthesia: Topical Ethyl chloride.  With sterile technique and under real time ultrasound guidance:  1/2 cc Kenalog 40, 1/2 cc lidocaine injected easily Completed without difficulty  Pain immediately resolved suggesting accurate placement of the medication.  Advised to call if fevers/chills, erythema, induration, drainage, or persistent bleeding.  Images permanently stored and available for review in the ultrasound unit.  Impression: Technically successful ultrasound guided injection.  Impression and Recommendations:    Right hand pain Right fourth PIP synovitis. This joint was injected today. Repeat hand x-rays. Brother with a history of severe rheumatoid arthritis, we are going to proceed with rheumatoid testing. She has been avoiding NSAIDs but has a recent history of a negative stress test.  Rheumatoid work-up is negative, adding Celebrex, dyspepsia with meloxicam in the past.   ___________________________________________ Gwen Her. Dianah Field, M.D., ABFM., CAQSM. Primary Care and Sports Medicine [facility]  Adjunct Professor of [employer]

## 2018-06-09 NOTE — Assessment & Plan Note (Addendum)
Right fourth PIP synovitis. This joint was injected today. Repeat hand x-rays. Brother with a history of severe rheumatoid arthritis, we are going to proceed with rheumatoid testing. She has been avoiding NSAIDs but has a recent history of a negative stress test.  Rheumatoid work-up is negative, adding Celebrex, dyspepsia with meloxicam in the past.

## 2018-06-12 MED ORDER — CELECOXIB 200 MG PO CAPS: ORAL_CAPSULE | ORAL | 2 refills | Status: DC

## 2018-06-12 NOTE — Addendum Note (Signed)
Addended by: Silverio Decamp on: 06/12/2018 09:15 AM   Modules accepted: Orders

## 2018-06-13 LAB — RHEUMATOID ARTHRITIS DIAGNOSTIC PANEL, COMPREHENSIVE
Cyclic Citrullin Peptide Ab: <16 Units (ref <20)
Rheumatoid Factor (IgA): <5 U (ref <=6)
Rheumatoid Factor (IgG): <5 U (ref <=6)
Rheumatoid Factor (IgM): <5 U (ref <=6)
SSA (Ro) (ENA) Antibody, IgG: <1 AI
SSB (La) (ENA) Antibody, IgG: <1 AI

## 2018-06-13 LAB — CK: Total CK: 23 U/L — ABNORMAL LOW (ref 29–143)

## 2018-06-13 LAB — ANA, IFA COMPREHENSIVE PANEL
Anti Nuclear Antibody (ANA): NEGATIVE
ENA SM Ab Ser-aCnc: <1 AI
SM/RNP: <1 AI
SSA (Ro) (ENA) Antibody, IgG: <1 AI
SSB (La) (ENA) Antibody, IgG: <1 AI
Scleroderma (Scl-70) (ENA) Antibody, IgG: <1 AI
ds DNA Ab: <1 IU/mL

## 2018-06-13 LAB — SEDIMENTATION RATE: Sed Rate: 14 mm/h (ref 0–30)

## 2018-06-13 LAB — URIC ACID: Uric Acid, Serum: 6.9 mg/dL (ref 2.5–7.0)

## 2018-06-16 ENCOUNTER — Telehealth: Payer: Self-pay | Admitting: Sports Medicine

## 2018-06-16 NOTE — Telephone Encounter (Signed)
Approvedon May 22 Celebrex CaseId:[id number];Status:Approved;Review Type:Prior Auth;Coverage Start Date:05/13/2018;Coverage End Date:06/12/2019; Pharmacy aware.

## 2018-06-28 ENCOUNTER — Other Ambulatory Visit: Payer: Self-pay | Admitting: Sports Medicine

## 2018-07-06 ENCOUNTER — Ambulatory Visit: Payer: BLUE CROSS/BLUE SHIELD | Admitting: Sports Medicine

## 2018-07-06 ENCOUNTER — Other Ambulatory Visit: Payer: Self-pay | Admitting: Sports Medicine

## 2018-07-06 DIAGNOSIS — M5412 Radiculopathy, cervical region: Secondary | ICD-10-CM

## 2018-07-06 DIAGNOSIS — M542 Cervicalgia: Secondary | ICD-10-CM | POA: Diagnosis not present

## 2018-07-06 DIAGNOSIS — M545 Low back pain: Secondary | ICD-10-CM | POA: Diagnosis not present

## 2018-07-06 MED ORDER — GABAPENTIN 800 MG PO TABS: ORAL_TABLET | ORAL | 3 refills | Status: DC

## 2018-09-30 ENCOUNTER — Ambulatory Visit (INDEPENDENT_AMBULATORY_CARE_PROVIDER_SITE_OTHER): Payer: BC Managed Care – PPO | Admitting: Osteopathic Medicine

## 2018-09-30 ENCOUNTER — Encounter: Payer: Self-pay | Admitting: Osteopathic Medicine

## 2018-09-30 ENCOUNTER — Other Ambulatory Visit: Payer: Self-pay

## 2018-09-30 VITALS — BP 144/86 | HR 89 | Temp 98.4°F | Ht 60.0 in | Wt 157.9 lb

## 2018-09-30 DIAGNOSIS — M79641 Pain in right hand: Secondary | ICD-10-CM

## 2018-09-30 DIAGNOSIS — F419 Anxiety disorder, unspecified: Secondary | ICD-10-CM

## 2018-09-30 DIAGNOSIS — Z23 Encounter for immunization: Secondary | ICD-10-CM | POA: Diagnosis not present

## 2018-09-30 DIAGNOSIS — Z1211 Encounter for screening for malignant neoplasm of colon: Secondary | ICD-10-CM | POA: Diagnosis not present

## 2018-09-30 DIAGNOSIS — E1169 Type 2 diabetes mellitus with other specified complication: Secondary | ICD-10-CM | POA: Diagnosis not present

## 2018-09-30 DIAGNOSIS — E1165 Type 2 diabetes mellitus with hyperglycemia: Secondary | ICD-10-CM

## 2018-09-30 DIAGNOSIS — F32A Depression, unspecified: Secondary | ICD-10-CM

## 2018-09-30 DIAGNOSIS — E1140 Type 2 diabetes mellitus with diabetic neuropathy, unspecified: Secondary | ICD-10-CM

## 2018-09-30 DIAGNOSIS — IMO0002 Reserved for concepts with insufficient information to code with codable children: Secondary | ICD-10-CM

## 2018-09-30 DIAGNOSIS — F329 Major depressive disorder, single episode, unspecified: Secondary | ICD-10-CM

## 2018-09-30 DIAGNOSIS — Z794 Long term (current) use of insulin: Secondary | ICD-10-CM | POA: Diagnosis not present

## 2018-09-30 MED ORDER — CELECOXIB 200 MG PO CAPS: ORAL_CAPSULE | ORAL | 2 refills | Status: DC

## 2018-09-30 NOTE — Progress Notes (Signed)
HPI: Tonya Chandler is a 58 y.o. female who  has a past medical history of Anxiety, Colon polyps, Depression, Diabetes mellitus, Gastro-esophageal reflux, Hyperlipidemia, and Hypertension.  she presents to [facility] today, 09/30/18,  for chief complaint of: New to establish care Diabetes  Very pleasant new patient here to establish care.  Works as a Biochemist, clinical, travels a good deal.  Previously at [employer] in [employer], here for usual "6 months checkup"  Diabetes: Last A1c about 6 months ago was 9.0.  Patient is taking Basaglar 54 units daily, Trulicity she thinks [id number] mg weekly, Invokana 300 mg daily.  History of neuropathy, taking gabapentin, prescription for 800 mg 4 times daily as needed, she typically only takes this in the evening.  Also has prescriptions for ropinirole 0.5 mg 2 to 3 tablets at night.  Health: Taking Cymbalta 60 mg daily, mirtazapine 45 mg nightly.  She is dealing with a daughter who has addiction problems, this has been very stressful and expensive for the family to deal with.  She would like to seek up with a therapist to talk about these issues and potentially in the future discussed medication changes.  Vascular: Reports abnormal EKG during surgical clearance work-up, she had a stress test which was reportedly normal.  Strong family history of heart attack in siblings and parents.     Past medical, surgical, social and family history reviewed:  Patient Active Problem List   Diagnosis Date Noted  . Radiculopathy 04/02/2018  . Radiculitis of right cervical region 01/20/2018  . Carpal tunnel syndrome, right 12/16/2017  . Right hand pain 06/24/2017  . Impingement syndrome of shoulder, right 06/24/2017  . Dupuytren's contracture of right hand 06/24/2017  . DIABETES MELLITUS, TYPE II 02/16/2010  . HYPERLIPIDEMIA 02/16/2010  . DEPRESSION 02/16/2010  . HYPERTENSION 02/16/2010  . GERD 02/16/2010  . KNEE PAIN, LEFT,  ACUTE 02/16/2010    Past Surgical History:  Procedure Laterality Date  . ABDOMINAL HYSTERECTOMY    . ANTERIOR CERVICAL DECOMP/DISCECTOMY FUSION N/A 04/02/2018   Procedure: ANTERIOR CERVICAL DECOMPRESSION FUSION, CERVICAL FOUR-FIVE, CERVICAL FIVE-SIX, WITH INSTRUMENTATION AND ALLOGRAFT;  Surgeon: Phylliss Bob, MD;  Location: [facility];  Service: Orthopedics;  Laterality: N/A;  . PIP JOINT FUSION Bilateral    pinky    Social History   Tobacco Use  . Smoking status: Never Smoker  . Smokeless tobacco: Never Used  Substance Use Topics  . Alcohol use: No    Family History  Problem Relation Age of Onset  . Diabetes Mother   . Dementia Mother   . High blood pressure Mother   . Heart attack Mother   . Colon cancer Mother   . Ovarian cancer Mother   . Heart attack Father   . Diabetes Father   . Heart attack Brother   . High blood pressure Brother   . Diabetes Brother   . Epilepsy Brother      Current medication list and allergy/intolerance information reviewed:    Current Outpatient Medications  Medication Sig Dispense Refill  . celecoxib (CELEBREX) 200 MG capsule One to 2 tablets by mouth daily as needed for pain. 60 capsule 2  . Cyanocobalamin (VITAMIN B-12) 2500 MCG SUBL Place 2,500 mcg under the tongue daily.    . cyclobenzaprine (FLEXERIL) 10 MG tablet Take 1 tablet (10 mg total) by mouth at bedtime. 30 tablet 3  . Dulaglutide (TRULICITY) 1.5 0000000 SOPN Inject 1.5 mg into the skin every Sunday.    Marland Kitchen  DULoxetine (CYMBALTA) 60 MG capsule Take 60 mg by mouth daily.    Marland Kitchen estradiol (ESTRACE) 1 MG tablet Take 1 mg by mouth at bedtime.     . gabapentin (NEURONTIN) 800 MG tablet 1 tab in the morning, 1 tab midday, 2 tabs at bedtime 120 tablet 3  . Insulin Glargine (BASAGLAR KWIKPEN) 100 UNIT/ML SOPN Inject 54 Units into the skin daily.     . INVOKANA 300 MG TABS tablet Take 300 mg by mouth daily.    . magnesium oxide (MAG-OX) 400 MG tablet Take 400 mg by mouth at bedtime.    .  mirtazapine (REMERON) 45 MG tablet Take 45 mg by mouth at bedtime.   5  . rOPINIRole (REQUIP) 0.5 MG tablet Take 1.5 mg by mouth at bedtime.    . trandolapril-verapamil (TARKA) 2-240 MG per tablet Take 1 tablet by mouth daily.    Marland Kitchen triamterene-hydrochlorothiazide (MAXZIDE-25) 37.5-25 MG tablet Take 1 tablet by mouth daily.    . famotidine (PEPCID) 20 MG tablet Take 20 mg by mouth daily before breakfast.     No current facility-administered medications for this visit.     Allergies  Allergen Reactions  . Adhesive [Tape] Hives    Welts--PLEASE USE HYPO ALLERGIC TAPE  . Ampicillin Diarrhea    Did it involve swelling of the face/tongue/throat, SOB, or low BP? No Did it involve sudden or severe rash/hives, skin peeling, or any reaction on the inside of your mouth or nose? No Did you need to seek medical attention at a hospital or doctor's office? No When did it last happen?30-40 years ago If all above answers are "NO", may proceed with cephalosporin use.   Marland Kitchen Cefprozil Other (See Comments)    Acid reflux/indigestion issues  . Meloxicam Other (See Comments)    Acid reflux/indigestion issues      Review of Systems:  Constitutional:  No  fever, no chills, No recent illness, No unintentional weight changes. No significant fatigue.   HEENT: No  headache, no vision change, no hearing change, No sore throat, No  sinus pressure  Cardiac: No  chest pain, No  pressure, No palpitations, No  Orthopnea  Respiratory:  No  shortness of breath. No  Cough  Gastrointestinal: No  abdominal pain, No  nausea, No  vomiting,  No  blood in stool, No  diarrhea, No  constipation   Musculoskeletal: No new myalgia/arthralgia  Skin: No  Rash, No other wounds/concerning lesions  Genitourinary: No  incontinence, No  abnormal genital bleeding, No abnormal genital discharge  Hem/Onc: No  easy bruising/bleeding, No  abnormal lymph node  Endocrine: No cold intolerance,  No heat intolerance. No  polyuria/polydipsia/polyphagia   Neurologic: No  weakness, No  dizziness, No  slurred speech/focal weakness/facial droop  Psychiatric: +concerns with depression, +concerns with anxiety, +sleep problems, No mood problems  Exam:  BP (!) 144/86 (BP Location: Left Arm, Patient Position: Sitting, Cuff Size: Normal)   Pulse 89   Temp 98.4 F (36.9 C) (Oral)   Ht 5' (1.524 m)   Wt 157 lb 14.4 oz (71.6 kg)   BMI 30.84 kg/m   Constitutional: VS see above. General Appearance: alert, well-developed, well-nourished, NAD  Eyes: Normal lids and conjunctive, non-icteric sclera  Neck: No masses, trachea midline. No thyroid enlargement. No tenderness/mass appreciated. No lymphadenopathy  Respiratory: Normal respiratory effort. no wheeze, no rhonchi, no rales  Cardiovascular: S1/S2 normal, no murmur, no rub/gallop auscultated. RRR. No lower extremity edema.   Gastrointestinal: Nontender, no masses. No  hepatomegaly, no splenomegaly. No hernia appreciated. Bowel sounds normal. Rectal exam deferred.   Musculoskeletal: Gait normal. No clubbing/cyanosis of digits.   Neurological: Normal balance/coordination. No tremor.   Skin: warm, dry, intact. No rash/ulcer. No concerning nevi or subq nodules on limited exam.    Psychiatric: Normal judgment/insight. Normal mood and affect. Oriented x3.    No results found for this or any previous visit (from the past 72 hour(s)).  No results found.  EKG reviewed from earlier this year, 03/2018.  Showed some pretty significant T wave inversions, she was sent for stress test at [facility], I do not have official report for this.     ASSESSMENT/PLAN: The primary encounter diagnosis was Type 2 diabetes, uncontrolled, with neuropathy ([place]). Diagnoses of Need for influenza vaccination, Need for varicella vaccine, Anxiety and depression, Colon cancer screening, and Right hand pain were also pertinent to this visit.   Orders Placed This Encounter  Procedures  . Flu  Vaccine QUAD 6+ mos PF IM (Fluarix Quad PF)  . Varicella-zoster vaccine IM (Shingrix)  . CBC  . COMPLETE METABOLIC PANEL WITH GFR  . Lipid panel  . Hemoglobin A1c  . Ambulatory referral to [facility]  . Ambulatory referral to Gastroenterology       Visit summary with medication list and pertinent instructions was printed for patient to review. All questions at time of visit were answered - patient instructed to contact office with any additional concerns or updates. ER/RTC precautions were reviewed with the patient.     Please note: voice recognition software was used to produce this document, and typos may escape review. Please contact Dr. Sheppard Coil for any needed clarifications.     Follow-up plan: Return for RECHECK PENDING RESULTS / IF WORSE OR CHANGE.

## 2018-10-01 LAB — COMPLETE METABOLIC PANEL WITH GFR
AG Ratio: 1.6 (calc) (ref 1.0–2.5)
ALT: 19 U/L (ref 6–29)
AST: 21 U/L (ref 10–35)
Albumin: 4.5 g/dL (ref 3.6–5.1)
Alkaline phosphatase (APISO): 84 U/L (ref 37–153)
BUN: 13 mg/dL (ref 7–25)
CO2: 32 mmol/L (ref 20–32)
Calcium: 9.7 mg/dL (ref 8.6–10.4)
Chloride: 96 mmol/L — ABNORMAL LOW (ref 98–110)
Creat: 0.6 mg/dL (ref 0.50–0.99)
GFR, Est African American: 115 mL/min/{1.73_m2} (ref >=60)
GFR, Est Non African American: 99 mL/min/{1.73_m2} (ref >=60)
Globulin: 2.8 g/dL (calc) (ref 1.9–3.7)
Glucose, Bld: 90 mg/dL (ref 65–99)
Potassium: 3.8 mmol/L (ref 3.5–5.3)
Sodium: 139 mmol/L (ref 135–146)
Total Bilirubin: 0.4 mg/dL (ref 0.2–1.2)
Total Protein: 7.3 g/dL (ref 6.1–8.1)

## 2018-10-01 LAB — CBC
HCT: 48.1 % — ABNORMAL HIGH (ref 35.0–45.0)
Hemoglobin: 16.6 g/dL — ABNORMAL HIGH (ref 11.7–15.5)
MCH: 30.6 pg (ref 27.0–33.0)
MCHC: 34.5 g/dL (ref 32.0–36.0)
MCV: 88.6 fL (ref 80.0–100.0)
MPV: 11.1 fL (ref 7.5–12.5)
Platelets: 372 10*3/uL (ref 140–400)
RBC: 5.43 10*6/uL — ABNORMAL HIGH (ref 3.80–5.10)
RDW: 13.3 % (ref 11.0–15.0)
WBC: 10.6 10*3/uL (ref 3.8–10.8)

## 2018-10-01 LAB — LIPID PANEL
Cholesterol: 216 mg/dL — ABNORMAL HIGH (ref <200)
HDL: 58 mg/dL (ref >=50)
Non-HDL Cholesterol (Calc): 158 mg/dL (calc) — ABNORMAL HIGH (ref <130)
Total CHOL/HDL Ratio: 3.7 (calc) (ref <5.0)
Triglycerides: 421 mg/dL — ABNORMAL HIGH (ref <150)

## 2018-10-01 LAB — HEMOGLOBIN A1C
Hgb A1c MFr Bld: 7.7 % of total Hgb — ABNORMAL HIGH (ref <5.7)
Mean Plasma Glucose: 174 (calc)
eAG (mmol/L): 9.7 (calc)

## 2018-10-02 MED ORDER — ATORVASTATIN CALCIUM 40 MG PO TABS: 40.0000 mg | ORAL_TABLET | Freq: Every day | ORAL | 3 refills | Status: DC

## 2018-10-02 NOTE — Addendum Note (Signed)
Addended by: Maryla Morrow on: 10/02/2018 04:04 PM   Modules accepted: Orders

## 2018-10-06 DIAGNOSIS — H5203 Hypermetropia, bilateral: Secondary | ICD-10-CM | POA: Diagnosis not present

## 2018-10-06 DIAGNOSIS — H524 Presbyopia: Secondary | ICD-10-CM | POA: Diagnosis not present

## 2018-10-06 DIAGNOSIS — H52223 Regular astigmatism, bilateral: Secondary | ICD-10-CM | POA: Diagnosis not present

## 2018-10-12 ENCOUNTER — Other Ambulatory Visit: Payer: Self-pay | Admitting: *Deleted

## 2018-10-12 MED ORDER — CYCLOBENZAPRINE HCL 10 MG PO TABS: 10.0000 mg | ORAL_TABLET | Freq: Every day | ORAL | 3 refills | Status: DC

## 2018-10-13 ENCOUNTER — Ambulatory Visit: Payer: BC Managed Care – PPO | Admitting: Psychology

## 2018-10-20 DIAGNOSIS — H43812 Vitreous degeneration, left eye: Secondary | ICD-10-CM | POA: Diagnosis not present

## 2018-10-20 DIAGNOSIS — E119 Type 2 diabetes mellitus without complications: Secondary | ICD-10-CM | POA: Diagnosis not present

## 2018-10-20 LAB — HM DIABETES EYE EXAM

## 2018-10-22 ENCOUNTER — Other Ambulatory Visit: Payer: Self-pay

## 2018-10-22 MED ORDER — TRANDOLAPRIL-VERAPAMIL HCL ER 2-240 MG PO TBCR: 1.0000 | EXTENDED_RELEASE_TABLET | Freq: Every day | ORAL | 0 refills | Status: DC

## 2018-11-02 ENCOUNTER — Ambulatory Visit (INDEPENDENT_AMBULATORY_CARE_PROVIDER_SITE_OTHER): Payer: BC Managed Care – PPO | Admitting: Psychology

## 2018-11-02 DIAGNOSIS — F331 Major depressive disorder, recurrent, moderate: Secondary | ICD-10-CM | POA: Diagnosis not present

## 2018-11-03 ENCOUNTER — Encounter: Payer: Self-pay | Admitting: Osteopathic Medicine

## 2018-11-10 ENCOUNTER — Ambulatory Visit (INDEPENDENT_AMBULATORY_CARE_PROVIDER_SITE_OTHER): Payer: BC Managed Care – PPO | Admitting: Psychology

## 2018-11-10 ENCOUNTER — Telehealth: Payer: Self-pay

## 2018-11-10 DIAGNOSIS — F331 Major depressive disorder, recurrent, moderate: Secondary | ICD-10-CM

## 2018-11-10 MED ORDER — TRULICITY 1.5 MG/0.5ML ~~LOC~~ SOAJ: 1.5000 mg | SUBCUTANEOUS | 11 refills | Status: DC

## 2018-11-10 NOTE — Telephone Encounter (Signed)
Pt called requesting med refill for Trulicity. Written by historical provider. Pls send to [facility] drug store.

## 2018-11-10 NOTE — Telephone Encounter (Signed)
Sent!

## 2018-11-10 NOTE — Telephone Encounter (Signed)
Pt has been updated that med refill sent to local pharmacy. No other inquiries during call.  

## 2018-11-24 ENCOUNTER — Ambulatory Visit (INDEPENDENT_AMBULATORY_CARE_PROVIDER_SITE_OTHER): Payer: BC Managed Care – PPO | Admitting: Psychology

## 2018-11-24 ENCOUNTER — Telehealth: Payer: Self-pay

## 2018-11-24 DIAGNOSIS — F331 Major depressive disorder, recurrent, moderate: Secondary | ICD-10-CM

## 2018-11-24 MED ORDER — INVOKANA 300 MG PO TABS: 300.0000 mg | ORAL_TABLET | Freq: Every day | ORAL | 1 refills | Status: DC

## 2018-11-24 NOTE — Telephone Encounter (Signed)
Pt called requesting med refill for Invokana. Written by historical provider. Pls send rx to [facility] located in [place].

## 2018-12-08 ENCOUNTER — Ambulatory Visit (INDEPENDENT_AMBULATORY_CARE_PROVIDER_SITE_OTHER): Payer: BC Managed Care – PPO | Admitting: Psychology

## 2018-12-08 DIAGNOSIS — F331 Major depressive disorder, recurrent, moderate: Secondary | ICD-10-CM

## 2018-12-22 ENCOUNTER — Ambulatory Visit (INDEPENDENT_AMBULATORY_CARE_PROVIDER_SITE_OTHER): Payer: BC Managed Care – PPO | Admitting: Psychology

## 2018-12-22 DIAGNOSIS — F331 Major depressive disorder, recurrent, moderate: Secondary | ICD-10-CM

## 2018-12-29 ENCOUNTER — Other Ambulatory Visit: Payer: Self-pay

## 2018-12-29 DIAGNOSIS — M79641 Pain in right hand: Secondary | ICD-10-CM

## 2018-12-29 MED ORDER — CELECOXIB 200 MG PO CAPS: ORAL_CAPSULE | ORAL | 0 refills | Status: DC

## 2018-12-31 ENCOUNTER — Other Ambulatory Visit: Payer: Self-pay

## 2018-12-31 MED ORDER — ROPINIROLE HCL 0.5 MG PO TABS: 1.5000 mg | ORAL_TABLET | Freq: Every day | ORAL | 3 refills | Status: DC

## 2018-12-31 NOTE — Telephone Encounter (Signed)
At provider's request contacted pt regarding clarification on current daily dose. As per pt - she is taking 0.5 mg/3 tabs at night.

## 2018-12-31 NOTE — Telephone Encounter (Signed)
Pt called requesting med refill for ropinirole. Written by historical provider. Pt is requesting #90 supply with refills. Pls send rx to [facility].

## 2018-12-31 NOTE — Telephone Encounter (Signed)
Prescription pended -  Can we just confirm that she is taking 0.5 mg pills, 3 per night? Thanks!

## 2019-01-04 ENCOUNTER — Ambulatory Visit (INDEPENDENT_AMBULATORY_CARE_PROVIDER_SITE_OTHER): Payer: BC Managed Care – PPO | Admitting: Osteopathic Medicine

## 2019-01-04 ENCOUNTER — Other Ambulatory Visit: Payer: Self-pay

## 2019-01-04 ENCOUNTER — Encounter: Payer: Self-pay | Admitting: Osteopathic Medicine

## 2019-01-04 VITALS — BP 110/77 | HR 83 | Temp 98.2°F | Wt 156.0 lb

## 2019-01-04 DIAGNOSIS — E1165 Type 2 diabetes mellitus with hyperglycemia: Secondary | ICD-10-CM

## 2019-01-04 DIAGNOSIS — E1140 Type 2 diabetes mellitus with diabetic neuropathy, unspecified: Secondary | ICD-10-CM | POA: Diagnosis not present

## 2019-01-04 DIAGNOSIS — M79641 Pain in right hand: Secondary | ICD-10-CM

## 2019-01-04 DIAGNOSIS — IMO0002 Reserved for concepts with insufficient information to code with codable children: Secondary | ICD-10-CM

## 2019-01-04 LAB — POCT GLYCOSYLATED HEMOGLOBIN (HGB A1C): Hemoglobin A1C: 8 % — AB (ref 4.0–5.6)

## 2019-01-04 NOTE — Progress Notes (Signed)
Subjective:    CC: Hand pain  HPI: Tonya Chandler returns, she is a pleasant 58 year old female with right fourth PIP synovitis, we last injected this about 7 months ago, she is having recurrence of pain, severe, persistent, localized without radiation.  I reviewed the past medical history, family history, social history, surgical history, and allergies today and no changes were needed.  Please see the problem list section below in epic for further details.  Past Medical History: Past Medical History:  Diagnosis Date  . Anxiety   . Colon polyps   . Depression   . Diabetes mellitus   . Gastro-esophageal reflux   . Hyperlipidemia   . Hypertension    Past Surgical History: Past Surgical History:  Procedure Laterality Date  . ABDOMINAL HYSTERECTOMY    . ANTERIOR CERVICAL DECOMP/DISCECTOMY FUSION N/A 04/02/2018   Procedure: ANTERIOR CERVICAL DECOMPRESSION FUSION, CERVICAL FOUR-FIVE, CERVICAL FIVE-SIX, WITH INSTRUMENTATION AND ALLOGRAFT;  Surgeon: Phylliss Bob, MD;  Location: [facility];  Service: Orthopedics;  Laterality: N/A;  . PIP JOINT FUSION Bilateral    pinky   Social History: Social History   Socioeconomic History  . Marital status: Married    Spouse name: Not on file  . Number of children: 2  . Years of education: Not on file  . Highest education level: Not on file  Occupational History  . Occupation: Tourist information centre manager: [employer]   Tobacco Use  . Smoking status: Never Smoker  . Smokeless tobacco: Never Used  Substance and Sexual Activity  . Alcohol use: No  . Drug use: No  . Sexual activity: Yes    Partners: Male  Other Topics Concern  . Not on file  Social History Narrative  . Not on file   Social Determinants of Health   Financial Resource Strain:   . Difficulty of Paying Living Expenses: Not on file  Food Insecurity:   . Worried About Charity fundraiser in the Last Year: Not on file  . Ran Out of Food in the Last Year: Not on file  Transportation  Needs:   . Lack of Transportation (Medical): Not on file  . Lack of Transportation (Non-Medical): Not on file  Physical Activity:   . Days of Exercise per Week: Not on file  . Minutes of Exercise per Session: Not on file  Stress:   . Feeling of Stress : Not on file  Social Connections:   . Frequency of Communication with Friends and Family: Not on file  . Frequency of Social Gatherings with Friends and Family: Not on file  . Attends Religious Services: Not on file  . Active Member of Clubs or Organizations: Not on file  . Attends Archivist Meetings: Not on file  . Marital Status: Not on file   Family History: Family History  Problem Relation Age of Onset  . Diabetes Mother   . Dementia Mother   . High blood pressure Mother   . Heart attack Mother   . Colon cancer Mother   . Ovarian cancer Mother   . Heart attack Father   . Diabetes Father   . Heart attack Brother   . High blood pressure Brother   . Diabetes Brother   . Epilepsy Brother    Allergies: Allergies  Allergen Reactions  . Adhesive [Tape] Hives    Welts--PLEASE USE HYPO ALLERGIC TAPE  . Ampicillin Diarrhea    Did it involve swelling of the face/tongue/throat, SOB, or low BP? No Did  it involve sudden or severe rash/hives, skin peeling, or any reaction on the inside of your mouth or nose? No Did you need to seek medical attention at a hospital or doctor's office? No When did it last happen?30-40 years ago If all above answers are "NO", may proceed with cephalosporin use.   Marland Kitchen Cefprozil Other (See Comments)    Acid reflux/indigestion issues  . Meloxicam Other (See Comments)    Acid reflux/indigestion issues   Medications: See med rec.  Review of Systems: No fevers, chills, night sweats, weight loss, chest pain, or shortness of breath.   Objective:    General: Well Developed, well nourished, and in no acute distress.  Neuro: Alert and oriented x3, extra-ocular muscles intact, sensation  grossly intact.  HEENT: Normocephalic, atraumatic, pupils equal round reactive to light, neck supple, no masses, no lymphadenopathy, thyroid nonpalpable.  Skin: Warm and dry, no rashes. Cardiac: Regular rate and rhythm, no murmurs rubs or gallops, no lower extremity edema.  Respiratory: Clear to auscultation bilaterally. Not using accessory muscles, speaking in full sentences. Right hand: Severe synovitis involving the fourth PIP.  Procedure: Real-time Ultrasound Guided injection of the right fourth PIP Device: Samsung HS60  Verbal informed consent obtained.  Time-out conducted.  Noted no overlying erythema, induration, or other signs of local infection.  Skin prepped in a sterile fashion.  Local anesthesia: Topical Ethyl chloride.  With sterile technique and under real time ultrasound guidance:  Noted significant synovitis, 1/2 cc Kenalog 40, 1/2 cc lidocaine injected easily.  Completed without difficulty  Pain immediately resolved suggesting accurate placement of the medication.  Advised to call if fevers/chills, erythema, induration, drainage, or persistent bleeding.  Images permanently stored and available for review in the ultrasound unit.  Impression: Technically successful ultrasound guided injection.  Impression and Recommendations:    Right hand pain Right fourth PIP synovitis, brother with history of severe rheumatoid arthritis, she has had negative testing recently. Considering the distribution of symptoms and severe synovitis on ultrasound I do think this is likely seronegative rheumatoid arthritis. I would like a second opinion from rheumatology and we are pulling the trigger for another set of labs including a full rheumatoid panel, ESR, uric acid, 14-3-3 antigen. I did inject Chandler right fourth PIP today.   ___________________________________________ Tonya Chandler. Dianah Field, M.D., ABFM., CAQSM. Primary Care and Sports Medicine [facility]  Adjunct  Professor of [employer]

## 2019-01-04 NOTE — Patient Instructions (Signed)
For INSULIN  Continue other meds as you are taking them  Measure fasting blood sugar every day: goal for now is to get this to 80-130  Increase daily Insulin dose by 2 units at a time, twice per week, until fasting sugars are consistently 80-130, then continue at that dose  Plan to recheck A1C in 3 months  Plan to follow-up in the office sooner if you experience low sugars   Plan to follow-up in the office sooner if your fasting sugars are consistently high  Bring all sugar readings with you to your office visits

## 2019-01-04 NOTE — Progress Notes (Signed)
HPI: Tonya Chandler is a 58 y.o. female who  has a past medical history of Anxiety, Colon polyps, Depression, Diabetes mellitus, Gastro-esophageal reflux, Hyperlipidemia, and Hypertension.  she presents to [facility] today, 01/04/19,  for chief complaint of: Diabetes follow-up   Diabetes: Last A1c about 6 months ago was 9.0.  Patient is taking Basaglar 54 units daily, Trulicity she thinks [id number] mg weekly, Invokana 300 mg daily.  History of neuropathy, taking gabapentin, prescription for 800 mg 4 times daily as needed, she typically only takes this in the evening.  Also has prescriptions for ropinirole 0.5 mg 2 to 3 tablets at night.  DIABETES SCREENING/PREVENTIVE CARE: A1C past 3-6 mos: Yes  controlled? decent  03/30/2018: 9.0  09/30/18: 7.7  Today 01/04/19: 8.0  BP goal <130/80: Yes   BP Readings from Last 3 Encounters:  01/04/19 110/77  09/30/18 (!) 144/86  06/09/18 (!) 149/91   LDL goal <70: unable to calculate d/t high TG, last check 09/30/18 Eye exam annually: 10/20/2018, importance discussed with patient Foot exam: No  Microalbuminuria:n/a on ACE Metformin: No  ACE/ARB: Yes  Antiplatelet if ASCVD Risk >10%: need Statin: Yes  Pneumovax: needs   Immunization History  Administered Date(s) Administered  . Influenza,inj,Quad PF,6+ Mos 11/05/2014, 10/12/2017, 10/12/2017, 09/30/2018  . Influenza-Unspecified 10/22/2014, 10/13/2015  . Tdap 07/22/2011  . Zoster Recombinat (Shingrix) 09/30/2018    The 10-year ASCVD risk score Mikey Bussing DC Jr., et al., 2013) is: 10.8%   Values used to calculate the score:     Age: 68 years     Sex: Female     Is Non-Hispanic African American: No     Diabetic: Yes     Tobacco smoker: No     Systolic Blood Pressure: 123456 mmHg     Is BP treated: Yes     HDL Cholesterol: 58 mg/dL     Total Cholesterol: 216 mg/dL          Past medical, surgical, social and family history reviewed:  Patient Active Problem  List   Diagnosis Date Noted  . Radiculopathy 04/02/2018  . Radiculitis of right cervical region 01/20/2018  . Carpal tunnel syndrome, right 12/16/2017  . Right hand pain 06/24/2017  . Impingement syndrome of shoulder, right 06/24/2017  . Dupuytren's contracture of right hand 06/24/2017  . DIABETES MELLITUS, TYPE II 02/16/2010  . HYPERLIPIDEMIA 02/16/2010  . DEPRESSION 02/16/2010  . HYPERTENSION 02/16/2010  . GERD 02/16/2010  . KNEE PAIN, LEFT, ACUTE 02/16/2010    Past Surgical History:  Procedure Laterality Date  . ABDOMINAL HYSTERECTOMY    . ANTERIOR CERVICAL DECOMP/DISCECTOMY FUSION N/A 04/02/2018   Procedure: ANTERIOR CERVICAL DECOMPRESSION FUSION, CERVICAL FOUR-FIVE, CERVICAL FIVE-SIX, WITH INSTRUMENTATION AND ALLOGRAFT;  Surgeon: Phylliss Bob, MD;  Location: [place];  Service: Orthopedics;  Laterality: N/A;  . PIP JOINT FUSION Bilateral    pinky    Social History   Tobacco Use  . Smoking status: Never Smoker  . Smokeless tobacco: Never Used  Substance Use Topics  . Alcohol use: No    Family History  Problem Relation Age of Onset  . Diabetes Mother   . Dementia Mother   . High blood pressure Mother   . Heart attack Mother   . Colon cancer Mother   . Ovarian cancer Mother   . Heart attack Father   . Diabetes Father   . Heart attack Brother   . High blood pressure Brother   . Diabetes Brother   . Epilepsy Brother  Current medication list and allergy/intolerance information reviewed:    Current Outpatient Medications  Medication Sig Dispense Refill  . atorvastatin (LIPITOR) 40 MG tablet Take 1 tablet (40 mg total) by mouth daily. 90 tablet 3  . celecoxib (CELEBREX) 200 MG capsule One to 2 tablets by mouth daily as needed for pain. 60 capsule 0  . Cyanocobalamin (VITAMIN B-12) 2500 MCG SUBL Place 2,500 mcg under the tongue daily.    . cyclobenzaprine (FLEXERIL) 10 MG tablet Take 1 tablet (10 mg total) by mouth at bedtime. 30 tablet 3  . Dulaglutide  (TRULICITY) 1.5 0000000 SOPN Inject 1.5 mg into the skin every Sunday. 4 pen 11  . DULoxetine (CYMBALTA) 60 MG capsule Take 60 mg by mouth daily.    Marland Kitchen estradiol (ESTRACE) 1 MG tablet Take 1 mg by mouth at bedtime.     . famotidine (PEPCID) 20 MG tablet Take 20 mg by mouth daily before breakfast.    . gabapentin (NEURONTIN) 800 MG tablet 1 tab in the morning, 1 tab midday, 2 tabs at bedtime 120 tablet 3  . Insulin Glargine (BASAGLAR KWIKPEN) 100 UNIT/ML SOPN Inject 54 Units into the skin daily.     . INVOKANA 300 MG TABS tablet Take 1 tablet (300 mg total) by mouth daily. 90 tablet 1  . magnesium oxide (MAG-OX) 400 MG tablet Take 400 mg by mouth at bedtime.    . mirtazapine (REMERON) 45 MG tablet Take 45 mg by mouth at bedtime.   5  . rOPINIRole (REQUIP) 0.5 MG tablet Take 3 tablets (1.5 mg total) by mouth at bedtime. 270 tablet 3  . trandolapril-verapamil (TARKA) 2-240 MG tablet Take 1 tablet by mouth daily. 90 tablet 0  . triamterene-hydrochlorothiazide (MAXZIDE-25) 37.5-25 MG tablet Take 1 tablet by mouth daily.     No current facility-administered medications for this visit.    Allergies  Allergen Reactions  . Adhesive [Tape] Hives    Welts--PLEASE USE HYPO ALLERGIC TAPE  . Ampicillin Diarrhea    Did it involve swelling of the face/tongue/throat, SOB, or low BP? No Did it involve sudden or severe rash/hives, skin peeling, or any reaction on the inside of your mouth or nose? No Did you need to seek medical attention at a hospital or doctor's office? No When did it last happen?30-40 years ago If all above answers are "NO", may proceed with cephalosporin use.   Marland Kitchen Cefprozil Other (See Comments)    Acid reflux/indigestion issues  . Meloxicam Other (See Comments)    Acid reflux/indigestion issues      Review of Systems:  Constitutional:  No  fever, no chills, No recent illness.   Cardiac: No  chest pain  Respiratory:  No  shortness of breath. No  Cough  Gastrointestinal:  No  abdominal pain   Exam:  BP 110/77 (BP Location: Left Arm, Patient Position: Sitting, Cuff Size: Normal)   Pulse 83   Temp 98.2 F (36.8 C) (Oral)   Wt 156 lb 0.6 oz (70.8 kg)   BMI 30.47 kg/m   Constitutional: VS see above. General Appearance: alert, well-developed, well-nourished, NAD  Neck: No masses, trachea midline  Respiratory: Normal respiratory effort.   Gastrointestinal: Nontender, no masses.   Musculoskeletal: Gait normal.  Psychiatric: Normal judgment/insight. Normal mood and affect. Oriented x3.    No results found for this or any previous visit (from the past 72 hour(s)).  No results found.      ASSESSMENT/PLAN: The primary encounter diagnosis was Type 2 diabetes, uncontrolled,  with neuropathy ([place]). A diagnosis of Right hand pain was also pertinent to this visit.  Patient Instructions  For INSULIN  Continue other meds as you are taking them  Measure fasting blood sugar every day: goal for now is to get this to 80-130  Increase daily Insulin dose by 2 units at a time, twice per week, until fasting sugars are consistently 80-130, then continue at that dose  Plan to recheck A1C in 3 months  Plan to follow-up in the office sooner if you experience low sugars   Plan to follow-up in the office sooner if your fasting sugars are consistently high  Bring all sugar readings with you to your office visits      Orders Placed This Encounter  Procedures  . 14-3-3 eta Protein (synovial inflammation panel)  . Rheumatoid Arthritis Diagnostic Panel, Comprehensive  . Uric acid  . Sedimentation rate  . POCT HgB A1C       Visit summary with medication list and pertinent instructions was printed for patient to review. All questions at time of visit were answered - patient instructed to contact office with any additional concerns or updates. ER/RTC precautions were reviewed with the patient.   Total time spent: 25 minutes, greater than 50% of which  spent counseling and coordinating care for the diagnoses listed in assessment/plan.  Please note: voice recognition software was used to produce this document, and typos may escape review. Please contact Dr. Sheppard Coil for any needed clarifications.     Follow-up plan: Return in about 3 months (around 04/04/2019) for recheck A1C / diabetes - see [place] sooner if needed! Marland Kitchen

## 2019-01-04 NOTE — Assessment & Plan Note (Signed)
Right fourth PIP synovitis, brother with history of severe rheumatoid arthritis, she has had negative testing recently. Considering the distribution of symptoms and severe synovitis on ultrasound I do think this is likely seronegative rheumatoid arthritis. I would like a second opinion from rheumatology and we are pulling the trigger for another set of labs including a full rheumatoid panel, ESR, uric acid, 14-3-3 antigen. I did inject her right fourth PIP today.

## 2019-01-05 ENCOUNTER — Ambulatory Visit: Payer: BC Managed Care – PPO | Admitting: Psychology

## 2019-01-06 ENCOUNTER — Other Ambulatory Visit: Payer: Self-pay

## 2019-01-06 MED ORDER — TRANDOLAPRIL-VERAPAMIL HCL ER 2-240 MG PO TBCR: 1.0000 | EXTENDED_RELEASE_TABLET | Freq: Every day | ORAL | 3 refills | Status: DC

## 2019-01-06 MED ORDER — DULOXETINE HCL 60 MG PO CPEP: 60.0000 mg | ORAL_CAPSULE | Freq: Every day | ORAL | 3 refills | Status: DC

## 2019-01-10 LAB — RHEUMATOID ARTHRITIS DIAGNOSTIC PANEL, COMPREHENSIVE
Cyclic Citrullin Peptide Ab: <16 Units (ref <20)
Rheumatoid Factor (IgA): <5 U (ref <=6)
Rheumatoid Factor (IgG): <5 U (ref <=6)
Rheumatoid Factor (IgM): <5 U (ref <=6)
SSA (Ro) (ENA) Antibody, IgG: <1 AI
SSB (La) (ENA) Antibody, IgG: <1 AI

## 2019-01-10 LAB — URIC ACID: Uric Acid, Serum: 6 mg/dL (ref 2.5–7.0)

## 2019-01-10 LAB — SEDIMENTATION RATE: Sed Rate: 6 mm/h (ref 0–30)

## 2019-01-10 LAB — 14-3-3 ETA PROTEIN: 14-3-3 eta Protein: <0.2 ng/mL (ref <0.2)

## 2019-01-19 ENCOUNTER — Ambulatory Visit: Payer: BC Managed Care – PPO | Admitting: Psychology

## 2019-01-26 ENCOUNTER — Other Ambulatory Visit: Payer: Self-pay | Admitting: *Deleted

## 2019-01-26 ENCOUNTER — Encounter: Payer: Self-pay | Admitting: Gastroenterology

## 2019-01-26 MED ORDER — CYCLOBENZAPRINE HCL 10 MG PO TABS: 10.0000 mg | ORAL_TABLET | Freq: Every day | ORAL | 3 refills | Status: DC

## 2019-01-28 ENCOUNTER — Other Ambulatory Visit: Payer: Self-pay | Admitting: Neurology

## 2019-01-28 DIAGNOSIS — M79641 Pain in right hand: Secondary | ICD-10-CM

## 2019-01-28 MED ORDER — CELECOXIB 200 MG PO CAPS: ORAL_CAPSULE | ORAL | 0 refills | Status: DC

## 2019-01-29 DIAGNOSIS — H1031 Unspecified acute conjunctivitis, right eye: Secondary | ICD-10-CM | POA: Diagnosis not present

## 2019-02-02 ENCOUNTER — Ambulatory Visit: Payer: BC Managed Care – PPO | Admitting: Psychology

## 2019-02-03 ENCOUNTER — Encounter: Payer: Self-pay | Admitting: Gastroenterology

## 2019-02-03 ENCOUNTER — Ambulatory Visit (INDEPENDENT_AMBULATORY_CARE_PROVIDER_SITE_OTHER): Payer: BC Managed Care – PPO | Admitting: Gastroenterology

## 2019-02-03 ENCOUNTER — Other Ambulatory Visit: Payer: Self-pay

## 2019-02-03 VITALS — BP 118/82 | HR 83 | Temp 97.9°F | Ht 60.0 in | Wt 158.2 lb

## 2019-02-03 DIAGNOSIS — K219 Gastro-esophageal reflux disease without esophagitis: Secondary | ICD-10-CM

## 2019-02-03 DIAGNOSIS — K648 Other hemorrhoids: Secondary | ICD-10-CM

## 2019-02-03 DIAGNOSIS — R1310 Dysphagia, unspecified: Secondary | ICD-10-CM | POA: Diagnosis not present

## 2019-02-03 DIAGNOSIS — K580 Irritable bowel syndrome with diarrhea: Secondary | ICD-10-CM

## 2019-02-03 DIAGNOSIS — Z01818 Encounter for other preprocedural examination: Secondary | ICD-10-CM

## 2019-02-03 DIAGNOSIS — Z800 Family history of malignant neoplasm of digestive organs: Secondary | ICD-10-CM

## 2019-02-03 MED ORDER — CLENPIQ 10-3.5-12 MG-GM -GM/160ML PO SOLN: 1.0000 | Freq: Once | ORAL | 0 refills | Status: AC

## 2019-02-03 NOTE — Progress Notes (Signed)
Chief Complaint: Family history of colon cancer, GERD, dysphagia  Referring Provider:     Emeterio Reeve, DO  HPI:    Tonya Chandler is a 59 y.o. female with a history of anxiety, depression, diabetes, GERD, hyperlipidemia, hypertension, referred to the Gastroenterology Clinic for evaluation of ongoing polyp surveillance/colon cancer screening.  She has a strong family history of CRC.  Her mother was diagnosed with CRC in 2011/04/23 (age >34), and died within 6 days of dx.  Has had ling-standing "GI issues" since teenage years. Long hx of IBS-D, which is typically well controlled with dietary management. Worse with green vegetables and raw carrots.   Hx of gastric ulcers 15+ years ago.  No recurrence and no abdominal/epigastric pain.  Longstanding history of reflux, well controlled with Nexium 40 mg/day and occasional use of Pepcid 20 prn (a few times/month). Index sxs of HB, post prandial regurgitation. Intermittent solid food dysphagia, pointing to anterior neck/suprasternal notch. Had C-spine surgery in Apr 23, 2018, with some worsening in dysphagia for the last 8-9 months. Has had esophageal dilation approx 20 years ago with good effect.   Recent external hemorrhoid- treated with Preparation H with resolution. No prior similar sxs.   CBC in 09/2018 with H/H 16.6/48.1 (stable from previous), with otherwise normal WBC, PLT, normal CMP, A1c 7.7 (from 9).  No recent abdominal imaging for review.  Endoscopic Hx: -EGD (04/2009, [facility]): Normal -Colonoscopy (04/2009, [facility]): Normal colon, biopsies to rule out MC, medium internal hemorrhoids.  Repeat in 10 years  Past Medical History:  Diagnosis Date  . Anemia   . Anxiety   . Colon polyps   . Depression   . Diabetes mellitus   . Diverticulosis   . Gastro-esophageal reflux   . Gastrointestinal ulcer   . H/O: GI bleed    Michela Pitcher it was due to taking metformin  . Hyperlipidemia   . Hypertension   . IBS (irritable bowel  syndrome)   . Obesity      Past Surgical History:  Procedure Laterality Date  . ABDOMINAL HYSTERECTOMY    . ANTERIOR CERVICAL DECOMP/DISCECTOMY FUSION N/A 04/02/2018   Procedure: ANTERIOR CERVICAL DECOMPRESSION FUSION, CERVICAL FOUR-FIVE, CERVICAL FIVE-SIX, WITH INSTRUMENTATION AND ALLOGRAFT;  Surgeon: Phylliss Bob, MD;  Location: [place];  Service: Orthopedics;  Laterality: N/A;  . COLONOSCOPY WITH ESOPHAGOGASTRODUODENOSCOPY (EGD)  05/04/2009  . PIP JOINT FUSION Bilateral    pinky   Family History  Problem Relation Age of Onset  . Diabetes Mother   . Dementia Mother   . High blood pressure Mother   . Heart attack Mother   . Colon cancer Mother   . Ovarian cancer Mother   . Heart attack Father   . Diabetes Father   . Heart attack Brother   . High blood pressure Brother   . Diabetes Brother   . Epilepsy Brother   . Colon polyps Daughter 80  . Esophageal cancer Neg Hx    Social History   Tobacco Use  . Smoking status: Never Smoker  . Smokeless tobacco: Never Used  Substance Use Topics  . Alcohol use: No  . Drug use: No   Current Outpatient Medications  Medication Sig Dispense Refill  . atorvastatin (LIPITOR) 40 MG tablet Take 1 tablet (40 mg total) by mouth daily. 90 tablet 3  . celecoxib (CELEBREX) 200 MG capsule One to 2 tablets by mouth daily as needed for pain. 60 capsule 0  .  Cyanocobalamin (VITAMIN B-12) 2500 MCG SUBL Place 2,500 mcg under the tongue daily.    . cyclobenzaprine (FLEXERIL) 10 MG tablet Take 1 tablet (10 mg total) by mouth at bedtime. 30 tablet 3  . Dulaglutide (TRULICITY) 1.5 0000000 SOPN Inject 1.5 mg into the skin every Sunday. 4 pen 11  . DULoxetine (CYMBALTA) 60 MG capsule Take 1 capsule (60 mg total) by mouth daily. 90 capsule 3  . esomeprazole (NEXIUM) 40 MG capsule Take 40 mg by mouth daily at 12 noon.    Marland Kitchen estradiol (ESTRACE) 1 MG tablet Take 1 mg by mouth at bedtime.     . Famotidine 20 MG CHEW Chew 1 tablet by mouth as needed.    .  gabapentin (NEURONTIN) 800 MG tablet 1 tab in the morning, 1 tab midday, 2 tabs at bedtime 120 tablet 3  . Insulin Glargine (BASAGLAR KWIKPEN) 100 UNIT/ML SOPN Inject 54 Units into the skin daily.     . INVOKANA 300 MG TABS tablet Take 1 tablet (300 mg total) by mouth daily. 90 tablet 1  . magnesium oxide (MAG-OX) 400 MG tablet Take 400 mg by mouth at bedtime.    . mirtazapine (REMERON) 45 MG tablet Take 45 mg by mouth at bedtime.   5  . rOPINIRole (REQUIP) 0.5 MG tablet Take 3 tablets (1.5 mg total) by mouth at bedtime. 270 tablet 3  . trandolapril-verapamil (TARKA) 2-240 MG tablet Take 1 tablet by mouth daily. 90 tablet 3  . triamterene-hydrochlorothiazide (MAXZIDE-25) 37.5-25 MG tablet Take 1 tablet by mouth daily.     No current facility-administered medications for this visit.   Allergies  Allergen Reactions  . Adhesive [Tape] Hives    Welts--PLEASE USE HYPO ALLERGIC TAPE  . Ampicillin Diarrhea    Did it involve swelling of the face/tongue/throat, SOB, or low BP? No Did it involve sudden or severe rash/hives, skin peeling, or any reaction on the inside of your mouth or nose? No Did you need to seek medical attention at a hospital or doctor's office? No When did it last happen?30-40 years ago If all above answers are "NO", may proceed with cephalosporin use.   Marland Kitchen Cefprozil Other (See Comments)    Acid reflux/indigestion issues  . Meloxicam Other (See Comments)    Acid reflux/indigestion issues     Review of Systems: All systems reviewed and negative except where noted in HPI.     Physical Exam:    Wt Readings from Last 3 Encounters:  02/03/19 158 lb 4 oz (71.8 kg)  01/04/19 156 lb 0.6 oz (70.8 kg)  09/30/18 157 lb 14.4 oz (71.6 kg)    BP 118/82   Pulse 83   Temp 97.9 F (36.6 C)   Ht 5' (1.524 m)   Wt 158 lb 4 oz (71.8 kg)   BMI 30.91 kg/m  Constitutional:  Pleasant, in no acute distress. Psychiatric: Normal mood and affect. Behavior is normal. EENT:  Pupils normal.  Conjunctivae are normal. No scleral icterus. Neck supple. No cervical LAD. Cardiovascular: Normal rate, regular rhythm. No edema Pulmonary/chest: Effort normal and breath sounds normal. No wheezing, rales or rhonchi. Abdominal: Soft, nondistended, nontender. Bowel sounds active throughout. There are no masses palpable. No hepatomegaly. Neurological: Alert and oriented to person place and time. Skin: Skin is warm and dry. No rashes noted. Rectal: Exam deferred by patient to time of colonoscopy.    ASSESSMENT AND PLAN;   1) Family history of colon cancer -Last colonoscopy 2011 without any polyps.  Since  then, mother was diagnosed with CRC in 2013. -Schedule colonoscopy now for ongoing, increased rescreening  2) History of internal hemorrhoids -Colonoscopy in 2011 indicated medium size internal hemorrhoids.  Had been essentially asymptomatic until recent reported external hemorrhoid, resolved with Preparation H -Evaluate hemorrhoids at time of colonoscopy -Pending evaluation and symptomatology, could benefit from hemorrhoid band ligation  3) Dysphagia: -Discussed DDX for dysphagia, to include stricture, ring, motility disorder, as well as potential for external compression from recent C-spine surgery.  Her dysphagia certainly predates her cervical spine surgery, but possible exacerbation. -Upper GI series to evaluate for extrinsic posterior compression along with intraluminal process -EGD with possible dilation  4) GERD: -Resume Nexium 40 mg/day along with Pepcid on demand -Evaluate for LES laxity, erosive esophagitis, hiatal hernia at time of EGD -Can determine any need for change in acid suppression therapy pending endoscopic findings  5) IBS-D: -Well-controlled with dietary modifications alone  The indications, risks, and benefits of EGD and colonoscopy were explained to the patient in detail. Risks include but are not limited to bleeding, perforation, adverse  reaction to medications, and cardiopulmonary compromise. Sequelae include but are not limited to the possibility of surgery, hositalization, and mortality. The patient verbalized understanding and wished to proceed. All questions answered, referred to scheduler and bowel prep ordered. Further recommendations pending results of the exam.     Lavena Bullion, DO, FACG  02/03/2019, 3:26 PM   Emeterio Reeve, DO

## 2019-02-03 NOTE — Patient Instructions (Signed)
You have been scheduled for a Barium Esophogram at [facility] (1st floor of the hospital) on 02/12/19 at 845am. Please arrive 15 minutes prior to your appointment for registration. Make certain not to have anything to eat or drink 3 hours prior to your test. If you need to reschedule for any reason, please contact radiology at [phone] to do so. __________________________________________________________________ A barium swallow is an examination that concentrates on views of the esophagus. This tends to be a double contrast exam (barium and two liquids which, when combined, create a gas to distend the wall of the oesophagus) or single contrast (non-ionic iodine based). The study is usually tailored to your symptoms so a good history is essential. Attention is paid during the study to the form, structure and configuration of the esophagus, looking for functional disorders (such as aspiration, dysphagia, achalasia, motility and reflux) EXAMINATION You may be asked to change into a gown, depending on the type of swallow being performed. A radiologist and radiographer will perform the procedure. The radiologist will advise you of the type of contrast selected for your procedure and direct you during the exam. You will be asked to stand, sit or lie in several different positions and to hold a small amount of fluid in your mouth before being asked to swallow while the imaging is performed .In some instances you may be asked to swallow barium coated marshmallows to assess the motility of a solid food bolus. The exam can be recorded as a digital or video fluoroscopy procedure. POST PROCEDURE It will take 1-2 days for the barium to pass through your system. To facilitate this, it is important, unless otherwise directed, to increase your fluids for the next 24-48hrs and to resume your normal diet.  This test typically takes about 30 minutes to perform.  You have been scheduled for an endoscopy  and colonoscopy. Please follow the written instructions given to you at your visit today. Please pick up your prep supplies at the pharmacy within the next 1-3 days. If you use inhalers (even only as needed), please bring them with you on the day of your procedure. Your physician has requested that you go to www.startemmi.com and enter the access code given to you at your visit today. This web site gives a general overview about your procedure. However, you should still follow specific instructions given to you by our office regarding your preparation for the procedure.  It was a pleasure to see you today!  Gerrit Heck, D.O.  __________________________________________________________________________________

## 2019-02-11 ENCOUNTER — Ambulatory Visit (INDEPENDENT_AMBULATORY_CARE_PROVIDER_SITE_OTHER): Payer: BC Managed Care – PPO

## 2019-02-11 ENCOUNTER — Other Ambulatory Visit: Payer: Self-pay | Admitting: Gastroenterology

## 2019-02-11 DIAGNOSIS — Z1159 Encounter for screening for other viral diseases: Secondary | ICD-10-CM | POA: Diagnosis not present

## 2019-02-12 ENCOUNTER — Ambulatory Visit (HOSPITAL_COMMUNITY)
Admission: RE | Admit: 2019-02-12 | Discharge: 2019-02-12 | Disposition: A | Discharge status: Home / Self Care | Payer: BC Managed Care – PPO | Source: Ambulatory Visit | Attending: Gastroenterology | Admitting: Gastroenterology

## 2019-02-12 ENCOUNTER — Other Ambulatory Visit: Payer: Self-pay

## 2019-02-12 DIAGNOSIS — K219 Gastro-esophageal reflux disease without esophagitis: Secondary | ICD-10-CM

## 2019-02-12 DIAGNOSIS — R1310 Dysphagia, unspecified: Secondary | ICD-10-CM | POA: Diagnosis not present

## 2019-02-12 DIAGNOSIS — K224 Dyskinesia of esophagus: Secondary | ICD-10-CM | POA: Diagnosis not present

## 2019-02-12 DIAGNOSIS — Z800 Family history of malignant neoplasm of digestive organs: Secondary | ICD-10-CM | POA: Diagnosis not present

## 2019-02-12 DIAGNOSIS — K648 Other hemorrhoids: Secondary | ICD-10-CM | POA: Diagnosis not present

## 2019-02-12 LAB — SARS CORONAVIRUS 2 (TAT 6-24 HRS): SARS Coronavirus 2: NEGATIVE

## 2019-02-15 ENCOUNTER — Other Ambulatory Visit: Payer: Self-pay

## 2019-02-15 ENCOUNTER — Ambulatory Visit (AMBULATORY_SURGERY_CENTER): Payer: BC Managed Care – PPO | Admitting: Gastroenterology

## 2019-02-15 ENCOUNTER — Encounter: Payer: Self-pay | Admitting: Gastroenterology

## 2019-02-15 VITALS — BP 125/78 | HR 73 | Temp 96.9°F | Resp 13 | Ht 60.0 in | Wt 158.0 lb

## 2019-02-15 DIAGNOSIS — K219 Gastro-esophageal reflux disease without esophagitis: Secondary | ICD-10-CM

## 2019-02-15 DIAGNOSIS — K5140 Inflammatory polyps of colon without complications: Secondary | ICD-10-CM | POA: Diagnosis not present

## 2019-02-15 DIAGNOSIS — Z800 Family history of malignant neoplasm of digestive organs: Secondary | ICD-10-CM

## 2019-02-15 DIAGNOSIS — R1310 Dysphagia, unspecified: Secondary | ICD-10-CM | POA: Diagnosis not present

## 2019-02-15 DIAGNOSIS — R933 Abnormal findings on diagnostic imaging of other parts of digestive tract: Secondary | ICD-10-CM

## 2019-02-15 DIAGNOSIS — D120 Benign neoplasm of cecum: Secondary | ICD-10-CM

## 2019-02-15 DIAGNOSIS — K317 Polyp of stomach and duodenum: Secondary | ICD-10-CM

## 2019-02-15 DIAGNOSIS — Z1211 Encounter for screening for malignant neoplasm of colon: Secondary | ICD-10-CM

## 2019-02-15 DIAGNOSIS — K641 Second degree hemorrhoids: Secondary | ICD-10-CM

## 2019-02-15 DIAGNOSIS — R1319 Other dysphagia: Secondary | ICD-10-CM

## 2019-02-15 MED ORDER — SODIUM CHLORIDE 0.9 % IV SOLN: Freq: Once | INTRAVENOUS | Status: DC

## 2019-02-15 MED ORDER — DEXTROSE 5 % IV SOLN: Freq: Once | INTRAVENOUS | Status: DC

## 2019-02-15 MED ORDER — SODIUM CHLORIDE 0.9 % IV SOLN: 500.0000 mL | Freq: Once | INTRAVENOUS | Status: DC

## 2019-02-15 NOTE — Progress Notes (Signed)
Called to room to assist during endoscopic procedure.  Patient ID and intended procedure confirmed with present staff. Received instructions for my participation in the procedure from the performing physician.  

## 2019-02-15 NOTE — Op Note (Signed)
[place] Patient Name: Tonya Chandler Procedure Date: 02/15/2019 8:24 AM MRN: [id number] Endoscopist: Gerrit Heck , MD Age: 59 Referring MD:  Date of Birth: 1960-10-25 Gender: Female Account #: [id number] Procedure:                Upper GI endoscopy Indications:              Dysphagia, Esophageal reflux, Abnormal UGI series                           59 yo female with history of GERD along with                            progressive solid food dysphagia. Esophagram with                            cervical osteophyte with extrinsic compression of                            the posterior esophagus. Medicines:                Monitored Anesthesia Care Procedure:                Pre-Anesthesia Assessment:                           - Prior to the procedure, a History and Physical                            was performed, and patient medications and                            allergies were reviewed. [name] tolerance of                            previous anesthesia was also reviewed. The risks                            and benefits of the procedure and the sedation                            options and risks were discussed with the patient.                            All questions were answered, and informed consent                            was obtained. Prior Anticoagulants: The patient has                            taken no previous anticoagulant or antiplatelet                            agents. ASA Grade Assessment: II - A patient with  mild systemic disease. After reviewing the risks                            and benefits, the patient was deemed in                            satisfactory condition to undergo the procedure.                           After obtaining informed consent, the endoscope was                            passed under direct vision. Throughout the                            procedure, the patient's blood pressure,  pulse, and                            oxygen saturations were monitored continuously. The                            Endoscope was introduced through the mouth, and                            advanced to the second part of duodenum. The upper                            GI endoscopy was accomplished without difficulty.                            The patient tolerated the procedure well. Scope In: Scope Out: Findings:                 The examined esophagus was normal. A guidewire was                            placed and the scope was withdrawn. Dilation was                            performed with a Savary dilator with mild                            resistance at 54 Fr. The dilation site was examined                            following endoscope reinsertion and showed no                            bleeding, mucosal tear or perforation. Estimated                            blood loss: none.  The Z-line was regular and was found 35 cm from the                            incisors.                           A few small sessile polyps with no bleeding and no                            stigmata of recent bleeding were found in the                            gastric fundus and in the gastric body. These                            polyps were removed with a cold biopsy forceps.                            Resection and retrieval were complete. Estimated                            blood loss was minimal.                           The incisura, gastric antrum and pylorus were                            normal.                           The duodenal bulb, first portion of the duodenum                            and second portion of the duodenum were normal. Complications:            No immediate complications. Estimated Blood Loss:     Estimated blood loss was minimal. Impression:               - Normal esophagus. Dilated.                           - Z-line regular, 35  cm from the incisors.                           - A few gastric polyps. Resected and retrieved.                           - Normal incisura, antrum and pylorus.                           - Normal duodenal bulb, first portion of the                            duodenum and second portion of the duodenum. Recommendation:           -  Patient has a contact number available for                            emergencies. The signs and symptoms of potential                            delayed complications were discussed with the                            patient. Return to normal activities tomorrow.                            Written discharge instructions were provided to the                            patient.                           - Soft diet today then progress to previous diet as                            tolerated tomorrow.                           - Continue present medications.                           - Await pathology results.                           - Return to GI clinic in 3 months or sooner as                            needed.                           - Colonoscopy today. Gerrit Heck, MD 02/15/2019 9:10:52 AM

## 2019-02-15 NOTE — Patient Instructions (Signed)
Please read handouts provided. Continue present medications. Soft Diet today then progress to previous diet as tolerated tomorrow. Return to GI clinic in 3 months or sooner as needed. Await pathology results.       YOU HAD AN ENDOSCOPIC PROCEDURE TODAY AT [facility]:   Refer to the procedure report that was given to you for any specific questions about what was found during the examination.  If the procedure report does not answer your questions, please call your gastroenterologist to clarify.  If you requested that your care partner not be given the details of your procedure findings, then the procedure report has been included in a sealed envelope for you to review at your convenience later.  YOU SHOULD EXPECT: Some feelings of bloating in the abdomen. Passage of more gas than usual.  Walking can help get rid of the air that was put into your GI tract during the procedure and reduce the bloating. If you had a lower endoscopy (such as a colonoscopy or flexible sigmoidoscopy) you may notice spotting of blood in your stool or on the toilet paper. If you underwent a bowel prep for your procedure, you may not have a normal bowel movement for a few days.  Please Note:  You might notice some irritation and congestion in your nose or some drainage.  This is from the oxygen used during your procedure.  There is no need for concern and it should clear up in a day or so.  SYMPTOMS TO REPORT IMMEDIATELY:   Following lower endoscopy (colonoscopy or flexible sigmoidoscopy):  Excessive amounts of blood in the stool  Significant tenderness or worsening of abdominal pains  Swelling of the abdomen that is new, acute  Fever of 100F or higher   Following upper endoscopy (EGD)  Vomiting of blood or coffee ground material  New chest pain or pain under the shoulder blades  Painful or persistently difficult swallowing  New shortness of breath  Fever of 100F or higher  Black,  tarry-looking stools  For urgent or emergent issues, a gastroenterologist can be reached at any hour by calling [phone].   DIET:  Drink plenty of fluids but you should avoid alcoholic beverages for 24 hours.  ACTIVITY:  You should plan to take it easy for the rest of today and you should NOT DRIVE or use heavy machinery until tomorrow (because of the sedation medicines used during the test).    FOLLOW UP: Our staff will call the number listed on your records 48-72 hours following your procedure to check on you and address any questions or concerns that you may have regarding the information given to you following your procedure. If we do not reach you, we will leave a message.  We will attempt to reach you two times.  During this call, we will ask if you have developed any symptoms of COVID 19. If you develop any symptoms (ie: fever, flu-like symptoms, shortness of breath, cough etc.) before then, please call [phone].  If you test positive for Covid 19 in the 2 weeks post procedure, please call and report this information to [place].    If any biopsies were taken you will be contacted by phone or by letter within the next 1-3 weeks.  Please call us at [phone] if you have not heard about the biopsies in 3 weeks.    SIGNATURES/CONFIDENTIALITY: You and/or your care partner have signed paperwork which will be entered into your electronic medical record.  These  signatures attest to the fact that that the information above on your After Visit Summary has been reviewed and is understood.  Full responsibility of the confidentiality of this discharge information lies with you and/or your care-partner. 

## 2019-02-15 NOTE — Op Note (Signed)
[place] Patient Name: Tonya Chandler Procedure Date: 02/15/2019 8:24 AM MRN: [id number] Endoscopist: Gerrit Heck , MD Age: 59 Referring MD:  Date of Birth: 1960-04-24 Gender: Female Account #: [id number] Procedure:                Colonoscopy Indications:              Screening in patient at increased risk: Colorectal                            cancer in mother 36 or older. Last colonoscopy was                            2011 and normal/no polyps. Medicines:                Monitored Anesthesia Care Procedure:                Pre-Anesthesia Assessment:                           - Prior to the procedure, a History and Physical                            was performed, and patient medications and                            allergies were reviewed. [name] tolerance of                            previous anesthesia was also reviewed. The risks                            and benefits of the procedure and the sedation                            options and risks were discussed with the patient.                            All questions were answered, and informed consent                            was obtained. Prior Anticoagulants: The patient has                            taken no previous anticoagulant or antiplatelet                            agents. ASA Grade Assessment: II - A patient with                            mild systemic disease. After reviewing the risks                            and benefits, the patient was deemed in  satisfactory condition to undergo the procedure.                           After obtaining informed consent, the colonoscope                            was passed under direct vision. Throughout the                            procedure, the patient's blood pressure, pulse, and                            oxygen saturations were monitored continuously. The                            Colonoscope was introduced through  the anus and                            advanced to the the cecum, identified by                            appendiceal orifice and ileocecal valve. The                            colonoscopy was performed without difficulty. The                            patient tolerated the procedure well. The quality                            of the bowel preparation was adequate. The                            ileocecal valve, appendiceal orifice, and rectum                            were photographed. Scope In: 8:47:28 AM Scope Out: 9:04:11 AM Scope Withdrawal Time: 0 hours 13 minutes 56 seconds  Total Procedure Duration: 0 hours 16 minutes 43 seconds  Findings:                 Hemorrhoids were found on perianal exam.                           A 3 mm polyp was found in the cecum. The polyp was                            sessile. The polyp was removed with a cold snare.                            Resection and retrieval were complete. Estimated                            blood loss was minimal.  Non-bleeding internal hemorrhoids were found during                            retroflexion. The hemorrhoids were medium-sized and                            Grade II (internal hemorrhoids that prolapse but                            reduce spontaneously). Complications:            No immediate complications. Estimated Blood Loss:     Estimated blood loss was minimal. Impression:               - Hemorrhoids found on perianal exam.                           - One 3 mm polyp in the cecum, removed with a cold                            snare. Resected and retrieved.                           - Non-bleeding internal hemorrhoids. Recommendation:           - Patient has a contact number available for                            emergencies. The signs and symptoms of potential                            delayed complications were discussed with the                            patient.  Return to normal activities tomorrow.                            Written discharge instructions were provided to the                            patient.                           - Soft diet today.                           - Continue present medications.                           - Await pathology results.                           - Repeat colonoscopy in 5 years for surveillance                            given presence of polyp on today's study and family  history.                           - Internal hemorrhoids were noted on this study and                            may be amenable to hemorrhoid band ligation. If you                            are interested in further treatment of these                            hemorrhoids with band ligation, please contact my                            clinic to set up an appointment for evaluation and                            treatment. Gerrit Heck, MD 02/15/2019 9:14:32 AM

## 2019-02-15 NOTE — Progress Notes (Signed)
Pt tolerated well. VSS. Awake and to recovery. 

## 2019-02-16 ENCOUNTER — Ambulatory Visit: Payer: BC Managed Care – PPO | Admitting: Psychology

## 2019-02-17 ENCOUNTER — Telehealth: Payer: Self-pay | Admitting: *Deleted

## 2019-02-17 ENCOUNTER — Telehealth: Payer: Self-pay

## 2019-02-17 NOTE — Telephone Encounter (Signed)
  Follow up Call-  Call back number 02/15/2019  Post procedure Call Back phone  # [phone]  Permission to leave phone message Yes  Some recent data might be hidden    Tried to leave a message but mailbox is full.

## 2019-02-17 NOTE — Telephone Encounter (Signed)
  Follow up Call-  Call back number 02/15/2019  Post procedure Call Back phone  # [phone]  Permission to leave phone message Yes  Some recent data might be hidden     Patient questions:  Do you have a fever, pain , or abdominal swelling? No. Pain Score  0 *  Have you tolerated food without any problems? Yes.    Have you been able to return to your normal activities? Yes.    Do you have any questions about your discharge instructions: Diet   No. Medications  No. Follow up visit  No.  Do you have questions or concerns about your Care? No.  Actions: * If pain score is 4 or above: No action needed, pain <4.  1. Have you developed a fever since your procedure? no  2.   Have you had an respiratory symptoms (SOB or cough) since your procedure? no  3.   Have you tested positive for COVID 19 since your procedure no  4.   Have you had any family members/close contacts diagnosed with the COVID 19 since your procedure?  no   If yes to any of these questions please route to Joylene John, RN and Alphonsa Gin, Therapist, sports.

## 2019-02-19 ENCOUNTER — Encounter: Payer: Self-pay | Admitting: Gastroenterology

## 2019-02-24 ENCOUNTER — Ambulatory Visit (INDEPENDENT_AMBULATORY_CARE_PROVIDER_SITE_OTHER): Payer: BC Managed Care – PPO | Admitting: Osteopathic Medicine

## 2019-02-24 ENCOUNTER — Other Ambulatory Visit: Payer: Self-pay | Admitting: Physician Assistant

## 2019-02-24 ENCOUNTER — Other Ambulatory Visit: Payer: Self-pay

## 2019-02-24 ENCOUNTER — Encounter: Payer: Self-pay | Admitting: Osteopathic Medicine

## 2019-02-24 VITALS — BP 139/85 | HR 86 | Temp 97.8°F | Wt 160.0 lb

## 2019-02-24 DIAGNOSIS — S29012A Strain of muscle and tendon of back wall of thorax, initial encounter: Secondary | ICD-10-CM

## 2019-02-24 DIAGNOSIS — M79641 Pain in right hand: Secondary | ICD-10-CM

## 2019-02-24 MED ORDER — PREDNISONE 10 MG (48) PO TBPK: ORAL_TABLET | Freq: Every day | ORAL | 0 refills | Status: DC

## 2019-02-24 MED ORDER — NITROGLYCERIN 0.4 MG SL SUBL: 0.4000 mg | SUBLINGUAL_TABLET | SUBLINGUAL | 3 refills | Status: DC | PRN

## 2019-02-24 NOTE — Progress Notes (Signed)
Jayel Simek is a 59 y.o. female who presents to  [place] at [facility]  today, 02/24/19, seeking care for the following:  The encounter diagnosis was Rhomboid muscle strain, initial encounter.   ASSESSMENT & PLAN with other pertinent history/findings:   1. Rhomboid muscle strain, initial encounter OMT applied to (+)patient relief: FPR, MFR, HVLA  Will trial steroid burst She has Flexeril and Aleve and Tylenol at home which have been helping, ok to continue  Printout w/ PT exercises for rhomboid strain  RTC as needed       No orders of the defined types were placed in this encounter.   Meds ordered this encounter  Medications  . predniSONE (STERAPRED UNI-PAK 48 TAB) 10 MG (48) TBPK tablet    Sig: Take by mouth daily. 12-Day taper, po    Dispense:  48 tablet    Refill:  0  . nitroGLYCERIN (NITROSTAT) 0.4 MG SL tablet    Sig: Place 1 tablet (0.4 mg total) under the tongue every 5 (five) minutes as needed for chest pain.    Dispense:  25 tablet    Refill:  3    There are no Patient Instructions on file for this visit.    Follow-up instructions: No follow-ups on file.                           BP 139/85 (BP Location: Left Arm, Patient Position: Sitting, Cuff Size: Normal)   Pulse 86   Temp 97.8 F (36.6 C) (Oral)   Wt 160 lb 0.6 oz (72.6 kg)   BMI 31.26 kg/m   No outpatient medications have been marked as taking for the 02/24/19 encounter (Appointment) with Emeterio Reeve, DO.    No results found for this or any previous visit (from the past 72 hour(s)).  No results found.    All questions at time of visit were answered - patient instructed to contact office with any additional concerns or updates.  ER/RTC precautions were reviewed with the patient.  Please note: voice recognition software was used to produce this document, and typos may escape review. Please contact Dr. Sheppard Coil for any  needed clarifications.

## 2019-02-26 ENCOUNTER — Telehealth: Payer: Self-pay

## 2019-02-26 DIAGNOSIS — M5412 Radiculopathy, cervical region: Secondary | ICD-10-CM

## 2019-02-26 MED ORDER — GABAPENTIN 800 MG PO TABS: ORAL_TABLET | ORAL | 3 refills | Status: DC

## 2019-02-26 NOTE — Telephone Encounter (Signed)
No problem, refilling medication.

## 2019-02-26 NOTE — Telephone Encounter (Signed)
Looks like Dr T prescribed this, sent a years worth in 06/2018 pt needs to call her pharmacy they should have refills on file

## 2019-02-26 NOTE — Telephone Encounter (Signed)
Pt called requesting med refill for gabapentin 800 mg. Is refill appropriate? Pls advise, thanks.

## 2019-02-26 NOTE — Telephone Encounter (Signed)
Attempted to contact pt regarding med refill completed for gabapentin. No answer, unable to leave a vm msg. Vm box is full.

## 2019-02-26 NOTE — Telephone Encounter (Signed)
Forwarding to Dr. Darene Lamer since Dr. Sheppard Coil is out of the office until Tuesday. Pt states that she is taking gabapentin = 4 tabs / daily. Last rx written for #120 w/ 3 refills. Confirmed with pharmacy that rx no longer has refills available. As per pharmacist quantity limit will need to be increased to match daily dosing. Pt is requesting to expedite rx refill request since she is out of medication. Pls advise, thanks.

## 2019-03-02 ENCOUNTER — Ambulatory Visit: Payer: BC Managed Care – PPO | Admitting: Psychology

## 2019-03-16 ENCOUNTER — Ambulatory Visit: Payer: BC Managed Care – PPO | Admitting: Psychology

## 2019-03-24 ENCOUNTER — Other Ambulatory Visit: Payer: Self-pay

## 2019-03-24 DIAGNOSIS — M79641 Pain in right hand: Secondary | ICD-10-CM

## 2019-03-24 MED ORDER — CELECOXIB 200 MG PO CAPS: ORAL_CAPSULE | ORAL | 0 refills | Status: DC

## 2019-03-30 ENCOUNTER — Ambulatory Visit: Payer: BC Managed Care – PPO | Admitting: Psychology

## 2019-04-05 ENCOUNTER — Ambulatory Visit (INDEPENDENT_AMBULATORY_CARE_PROVIDER_SITE_OTHER): Payer: BC Managed Care – PPO | Admitting: Osteopathic Medicine

## 2019-04-05 ENCOUNTER — Encounter: Payer: Self-pay | Admitting: Osteopathic Medicine

## 2019-04-05 VITALS — BP 126/86 | HR 90 | Wt 160.0 lb

## 2019-04-05 DIAGNOSIS — IMO0002 Reserved for concepts with insufficient information to code with codable children: Secondary | ICD-10-CM

## 2019-04-05 DIAGNOSIS — E1140 Type 2 diabetes mellitus with diabetic neuropathy, unspecified: Secondary | ICD-10-CM | POA: Diagnosis not present

## 2019-04-05 DIAGNOSIS — E1165 Type 2 diabetes mellitus with hyperglycemia: Secondary | ICD-10-CM | POA: Diagnosis not present

## 2019-04-05 LAB — POCT GLYCOSYLATED HEMOGLOBIN (HGB A1C): Hemoglobin A1C: 9.2 % — AB (ref 4.0–5.6)

## 2019-04-05 NOTE — Progress Notes (Signed)
Tonya Chandler is a 59 y.o. female who presents to  [place] at [facility]  today, 04/05/19, seeking care for the following: . Diabetes . Stress     ASSESSMENT & PLAN with other pertinent history/findings:  The encounter diagnosis was Type 2 diabetes, uncontrolled, with neuropathy ([place]).  A1C up today Doing well w/ diet and exercise Some stress w/ family but coping ok   DIABETES SCREENING/PREVENTIVE CARE: A1C:  03/30/2018: 9.0  09/30/18: 7.7  01/04/19: 8.0  Today 04/05/19: 9.2  Current antihyperglycemic Rx:  Trulicity 1.5 mg weekly  Basaglar 54 units daily  Invokana 300 mg daily --> Plan 04/05/19: see pr instructions - titrate up on LA insulin and will likely end up switching to Toujeo but will use but Basaglar first (splitting injections to help w/ absorption), monitor 1-2h postprandial Glc, consider mealtime insulin   BP goal <130/80: Yes   BP Readings from Last 3 Encounters:  04/05/19 126/86  02/24/19 139/85  02/15/19 125/78   LDL goal <70: unable to calculate d/t high TG, last check 09/30/18 Eye exam annually: 10/20/2018, importance discussed with patient Foot exam: No  Microalbuminuria:n/a on ACE Metformin: No  ACE/ARB: Yes  Antiplatelet if ASCVD Risk >10%: need Statin: Yes  Pneumovax: needs   Immunization History  Administered Date(s) Administered  . Influenza,inj,Quad PF,6+ Mos 11/05/2014, 10/12/2017, 10/12/2017, 09/30/2018  . Influenza-Unspecified 10/22/2014, 10/13/2015  . Tdap 07/22/2011  . Zoster Recombinat (Shingrix) 09/30/2018       Patient Instructions  Glucose monitoring Goals:  Fasting 100-150  1 Hour after meal: <140  2 Hours after meal: <120  Will increase Basaglar (daily / long-acting insulin):  Due to absorption issues at doses higher than 60 units, will split injections: take at same time but 2 injection sites (one in each leg, different sides of belly)  Take insulin at  evening/nighttime   Going up:   60 units daily x3 days  Then 32 + 32 units (64 total) x3 days  Then 34 + 34 units (68 total) x3 days  And so on until fasting sugar <150... then stay at that dose!   If fasting sugar is ok, but post-meal sugars are still high, will need to add a meal-time / short-acting insulin   Will plan on probably starting Toujeo long-acting insulin instead of Basaglar, but you can use up the Basaglar you have       Orders Placed This Encounter  Procedures  . POCT HgB A1C    No orders of the defined types were placed in this encounter.      Follow-up instructions: Return in about 2 weeks (around 04/19/2019) for VIRTUAL VISI or IN-OFFICE VISIT (patine preference) go over sugars .                                         BP 126/86 (BP Location: Left Arm, Patient Position: Sitting, Cuff Size: Normal)   Pulse 90   Wt 160 lb 0.6 oz (72.6 kg)   BMI 31.26 kg/m   Current Meds  Medication Sig  . atorvastatin (LIPITOR) 40 MG tablet Take 1 tablet (40 mg total) by mouth daily.  . celecoxib (CELEBREX) 200 MG capsule TAKE 1 TO 2 CAPSULES BY MOUTH DAILY AS NEEDED FOR PAIN  . Cyanocobalamin (VITAMIN B-12) 2500 MCG SUBL Place 2,500 mcg under the tongue daily.  . cyclobenzaprine (FLEXERIL) 10 MG  tablet Take 1 tablet (10 mg total) by mouth at bedtime.  . Dulaglutide (TRULICITY) 1.5 0000000 SOPN Inject 1.5 mg into the skin every Sunday.  . DULoxetine (CYMBALTA) 60 MG capsule Take 1 capsule (60 mg total) by mouth daily.  Marland Kitchen esomeprazole (NEXIUM) 40 MG capsule Take 40 mg by mouth daily at 12 noon.  Marland Kitchen estradiol (ESTRACE) 1 MG tablet Take 1 mg by mouth at bedtime.   . Famotidine 20 MG CHEW Chew 1 tablet by mouth as needed.  . gabapentin (NEURONTIN) 800 MG tablet 1 tab in the morning, 1 tab midday, 2 tabs at bedtime  . Insulin Glargine (BASAGLAR KWIKPEN) 100 UNIT/ML SOPN Inject 54 Units into the skin daily.   . INVOKANA 300 MG TABS  tablet Take 1 tablet (300 mg total) by mouth daily.  . magnesium oxide (MAG-OX) 400 MG tablet Take 400 mg by mouth at bedtime.  . mirtazapine (REMERON) 45 MG tablet Take 45 mg by mouth at bedtime.   . nitroGLYCERIN (NITROSTAT) 0.4 MG SL tablet Place 1 tablet (0.4 mg total) under the tongue every 5 (five) minutes as needed for chest pain.  . predniSONE (STERAPRED UNI-PAK 48 TAB) 10 MG (48) TBPK tablet Take by mouth daily. 12-Day taper, po  . trandolapril-verapamil (TARKA) 2-240 MG tablet Take 1 tablet by mouth daily.  Marland Kitchen triamterene-hydrochlorothiazide (MAXZIDE-25) 37.5-25 MG tablet Take 1 tablet by mouth daily.    Results for orders placed or performed in visit on 04/05/19 (from the past 72 hour(s))  POCT HgB A1C     Status: Abnormal   Collection Time: 04/05/19 10:49 AM  Result Value Ref Range   Hemoglobin A1C 9.2 (A) 4.0 - 5.6 %   HbA1c POC (<> result, manual entry)     HbA1c, POC (prediabetic range)     HbA1c, POC (controlled diabetic range)      No results found.  Depression screen [facility] 2/9 01/04/2019 09/30/2018  Decreased Interest 1 1  Down, Depressed, Hopeless 1 1  PHQ - 2 Score 2 2  Altered sleeping 1 1  Tired, decreased energy 0 0  Change in appetite 0 0  Feeling bad or failure about yourself  0 0  Trouble concentrating 0 0  Moving slowly or fidgety/restless 0 0  Suicidal thoughts 0 0  PHQ-9 Score 3 3  Difficult doing work/chores Not difficult at all Somewhat difficult    GAD 7 : Generalized Anxiety Score 01/04/2019 09/30/2018  Nervous, Anxious, on Edge 0 1  Control/stop worrying 0 0  Worry too much - different things 0 1  Trouble relaxing 0 0  Restless 0 0  Easily annoyed or irritable 1 1  Afraid - awful might happen 0 0  Total GAD 7 Score 1 3  Anxiety Difficulty Not difficult at all Somewhat difficult      All questions at time of visit were answered - patient instructed to contact office with any additional concerns or updates.  ER/RTC precautions were reviewed  with the patient.  Please note: voice recognition software was used to produce this document, and typos may escape review. Please contact Dr. Sheppard Coil for any needed clarifications.

## 2019-04-05 NOTE — Patient Instructions (Signed)
Glucose monitoring Goals:  Fasting 100-150  1 Hour after meal: <140  2 Hours after meal: <120  Will increase Basaglar (daily / long-acting insulin):  Due to absorption issues at doses higher than 60 units, will split injections: take at same time but 2 injection sites (one in each leg, different sides of belly)  Take insulin at evening/nighttime   Going up:   60 units daily x3 days  Then 32 + 32 units (64 total) x3 days  Then 34 + 34 units (68 total) x3 days  And so on until fasting sugar <150... then stay at that dose!   If fasting sugar is ok, but post-meal sugars are still high, will need to add a meal-time / short-acting insulin   Will plan on probably starting Toujeo long-acting insulin instead of Basaglar, but you can use up the Basaglar you have

## 2019-04-13 ENCOUNTER — Ambulatory Visit: Payer: BC Managed Care – PPO | Admitting: Psychology

## 2019-04-22 ENCOUNTER — Encounter: Payer: Self-pay | Admitting: Osteopathic Medicine

## 2019-04-22 ENCOUNTER — Other Ambulatory Visit: Payer: Self-pay

## 2019-04-22 ENCOUNTER — Ambulatory Visit (INDEPENDENT_AMBULATORY_CARE_PROVIDER_SITE_OTHER): Payer: BC Managed Care – PPO | Admitting: Osteopathic Medicine

## 2019-04-22 VITALS — BP 129/86 | HR 87 | Temp 98.1°F | Wt 159.1 lb

## 2019-04-22 DIAGNOSIS — R079 Chest pain, unspecified: Secondary | ICD-10-CM

## 2019-04-22 DIAGNOSIS — E1165 Type 2 diabetes mellitus with hyperglycemia: Secondary | ICD-10-CM | POA: Diagnosis not present

## 2019-04-22 DIAGNOSIS — I208 Other forms of angina pectoris: Secondary | ICD-10-CM | POA: Diagnosis not present

## 2019-04-22 DIAGNOSIS — E1140 Type 2 diabetes mellitus with diabetic neuropathy, unspecified: Secondary | ICD-10-CM | POA: Diagnosis not present

## 2019-04-22 DIAGNOSIS — IMO0002 Reserved for concepts with insufficient information to code with codable children: Secondary | ICD-10-CM

## 2019-04-22 MED ORDER — HUMALOG KWIKPEN 200 UNIT/ML ~~LOC~~ SOPN: PEN_INJECTOR | SUBCUTANEOUS | 5 refills | Status: DC

## 2019-04-22 NOTE — Progress Notes (Signed)
Tonya Chandler is a 59 y.o. female who presents to  [place] at [facility]  today, 04/22/19, seeking care for the following: . DM2 . Chest pain      ASSESSMENT & PLAN with other pertinent history/findings:  The primary encounter diagnosis was Type 2 diabetes, uncontrolled, with neuropathy ([place]). A diagnosis of Chest pain, unspecified type was also pertinent to this visit.   We decidede to increase basal insulin and start monitoring postprandial Glc Fasting Glc above 150 w/ 68 units Basaglar Postprandial Glc 1-2 hours >200-300  Will start mealtime insulin   Chest pain intermittent episodes respond well to nitro Last cardiology visit >1 year ago  EKG NSR, nonsepcific T wave changes, NO ST changes to indicate acute ischemia    Patient Instructions  Will get you set up with cardiology  Will start mealtime insulin (Humalog) 10 units before lunch, dinner     Orders Placed This Encounter  Procedures  . Ambulatory referral to Cardiology  . EKG 12-Lead    Meds ordered this encounter  Medications  . insulin lispro (HUMALOG KWIKPEN) 200 UNIT/ML KwikPen    Sig: Inject 10-15 units as directed prior to lunch and dinner    Dispense:  2 pen    Refill:  5       Follow-up instructions: Return in about 2 months (around 06/22/2019) for IN-OFFICE VISIT recheck A1C, see me sooner if needed .                                         BP 129/86 (BP Location: Left Arm, Patient Position: Sitting, Cuff Size: Normal)   Pulse 87   Temp 98.1 F (36.7 C) (Oral)   Wt 159 lb 1.9 oz (72.2 kg)   BMI 31.08 kg/m   Current Meds  Medication Sig  . atorvastatin (LIPITOR) 40 MG tablet Take 1 tablet (40 mg total) by mouth daily.  . celecoxib (CELEBREX) 200 MG capsule TAKE 1 TO 2 CAPSULES BY MOUTH DAILY AS NEEDED FOR PAIN  . Cyanocobalamin (VITAMIN B-12) 2500 MCG SUBL Place 2,500 mcg under the tongue daily.  .  cyclobenzaprine (FLEXERIL) 10 MG tablet Take 1 tablet (10 mg total) by mouth at bedtime.  . Dulaglutide (TRULICITY) 1.5 0000000 SOPN Inject 1.5 mg into the skin every Sunday.  . DULoxetine (CYMBALTA) 60 MG capsule Take 1 capsule (60 mg total) by mouth daily.  Marland Kitchen esomeprazole (NEXIUM) 40 MG capsule Take 40 mg by mouth daily at 12 noon.  Marland Kitchen estradiol (ESTRACE) 1 MG tablet Take 1 mg by mouth at bedtime.   . Famotidine 20 MG CHEW Chew 1 tablet by mouth as needed.  . gabapentin (NEURONTIN) 800 MG tablet 1 tab in the morning, 1 tab midday, 2 tabs at bedtime  . Insulin Glargine (BASAGLAR KWIKPEN) 100 UNIT/ML SOPN Inject 54 Units into the skin daily.   . INVOKANA 300 MG TABS tablet Take 1 tablet (300 mg total) by mouth daily.  . magnesium oxide (MAG-OX) 400 MG tablet Take 400 mg by mouth at bedtime.  . mirtazapine (REMERON) 45 MG tablet Take 45 mg by mouth at bedtime.   . nitroGLYCERIN (NITROSTAT) 0.4 MG SL tablet Place 1 tablet (0.4 mg total) under the tongue every 5 (five) minutes as needed for chest pain.  . predniSONE (STERAPRED UNI-PAK 48 TAB) 10 MG (48) TBPK tablet Take by mouth daily.  12-Day taper, po  . trandolapril-verapamil (TARKA) 2-240 MG tablet Take 1 tablet by mouth daily.  Marland Kitchen triamterene-hydrochlorothiazide (MAXZIDE-25) 37.5-25 MG tablet Take 1 tablet by mouth daily.    No results found for this or any previous visit (from the past 72 hour(s)).  No results found.  Depression screen [facility] 2/9 01/04/2019 09/30/2018  Decreased Interest 1 1  Down, Depressed, Hopeless 1 1  PHQ - 2 Score 2 2  Altered sleeping 1 1  Tired, decreased energy 0 0  Change in appetite 0 0  Feeling bad or failure about yourself  0 0  Trouble concentrating 0 0  Moving slowly or fidgety/restless 0 0  Suicidal thoughts 0 0  PHQ-9 Score 3 3  Difficult doing work/chores Not difficult at all Somewhat difficult    GAD 7 : Generalized Anxiety Score 01/04/2019 09/30/2018  Nervous, Anxious, on Edge 0 1  Control/stop  worrying 0 0  Worry too much - different things 0 1  Trouble relaxing 0 0  Restless 0 0  Easily annoyed or irritable 1 1  Afraid - awful might happen 0 0  Total GAD 7 Score 1 3  Anxiety Difficulty Not difficult at all Somewhat difficult      All questions at time of visit were answered - patient instructed to contact office with any additional concerns or updates.  ER/RTC precautions were reviewed with the patient.  Please note: voice recognition software was used to produce this document, and typos may escape review. Please contact Dr. Sheppard Coil for any needed clarifications.

## 2019-04-22 NOTE — Patient Instructions (Signed)
Will get you set up with cardiology  Will start mealtime insulin (Humalog) 10 units before lunch, dinner

## 2019-04-26 ENCOUNTER — Other Ambulatory Visit: Payer: Self-pay

## 2019-04-26 DIAGNOSIS — M79641 Pain in right hand: Secondary | ICD-10-CM

## 2019-04-26 MED ORDER — CELECOXIB 200 MG PO CAPS: ORAL_CAPSULE | ORAL | 1 refills | Status: DC

## 2019-04-27 ENCOUNTER — Ambulatory Visit: Payer: BC Managed Care – PPO | Admitting: Psychology

## 2019-05-04 DIAGNOSIS — Z6831 Body mass index (BMI) 31.0-31.9, adult: Secondary | ICD-10-CM | POA: Diagnosis not present

## 2019-05-04 DIAGNOSIS — R079 Chest pain, unspecified: Secondary | ICD-10-CM | POA: Diagnosis not present

## 2019-05-11 ENCOUNTER — Ambulatory Visit: Payer: BC Managed Care – PPO | Admitting: Psychology

## 2019-05-18 ENCOUNTER — Other Ambulatory Visit: Payer: Self-pay | Admitting: Sports Medicine

## 2019-05-18 DIAGNOSIS — R079 Chest pain, unspecified: Secondary | ICD-10-CM | POA: Diagnosis not present

## 2019-05-18 DIAGNOSIS — R9431 Abnormal electrocardiogram [ECG] [EKG]: Secondary | ICD-10-CM | POA: Diagnosis not present

## 2019-05-18 MED ORDER — CYCLOBENZAPRINE HCL 10 MG PO TABS: 10.0000 mg | ORAL_TABLET | Freq: Every day | ORAL | 3 refills | Status: DC

## 2019-05-20 ENCOUNTER — Other Ambulatory Visit: Payer: Self-pay

## 2019-05-20 MED ORDER — INVOKANA 300 MG PO TABS: 300.0000 mg | ORAL_TABLET | Freq: Every day | ORAL | 1 refills | Status: DC

## 2019-05-24 ENCOUNTER — Encounter: Payer: Self-pay | Admitting: Osteopathic Medicine

## 2019-05-24 ENCOUNTER — Other Ambulatory Visit: Payer: Self-pay

## 2019-05-24 ENCOUNTER — Telehealth: Payer: Self-pay

## 2019-05-24 DIAGNOSIS — R9439 Abnormal result of other cardiovascular function study: Secondary | ICD-10-CM

## 2019-05-24 DIAGNOSIS — E1140 Type 2 diabetes mellitus with diabetic neuropathy, unspecified: Secondary | ICD-10-CM

## 2019-05-24 DIAGNOSIS — IMO0002 Reserved for concepts with insufficient information to code with codable children: Secondary | ICD-10-CM

## 2019-05-24 DIAGNOSIS — Z6831 Body mass index (BMI) 31.0-31.9, adult: Secondary | ICD-10-CM | POA: Diagnosis not present

## 2019-05-24 HISTORY — DX: Abnormal result of other cardiovascular function study: R94.39

## 2019-05-24 MED ORDER — BASAGLAR KWIKPEN 100 UNIT/ML ~~LOC~~ SOPN: 54.0000 [IU] | PEN_INJECTOR | Freq: Every day | SUBCUTANEOUS | 1 refills | Status: DC

## 2019-05-24 NOTE — Telephone Encounter (Signed)
Tonya Chandler called and requested a refill on Mirtazapine. Never prescribed by Dr Sheppard Coil.

## 2019-05-24 NOTE — Progress Notes (Signed)
Subjective:    CC: mirtazapine refill  HPI:   I reviewed the past medical history, family history, social history, surgical history, and allergies today and no changes were needed.  Please see the problem list section below in epic for further details.  Past Medical History: Past Medical History:  Diagnosis Date  . Anemia   . Anxiety   . Colon polyps   . Depression   . Diabetes mellitus   . Diverticulosis   . Gastro-esophageal reflux   . Gastrointestinal ulcer   . H/O: GI bleed    Michela Pitcher it was due to taking metformin  . Hyperlipidemia   . Hypertension   . IBS (irritable bowel syndrome)   . Obesity    Past Surgical History: Past Surgical History:  Procedure Laterality Date  . ABDOMINAL HYSTERECTOMY    . ANTERIOR CERVICAL DECOMP/DISCECTOMY FUSION N/A 04/02/2018   Procedure: ANTERIOR CERVICAL DECOMPRESSION FUSION, CERVICAL FOUR-FIVE, CERVICAL FIVE-SIX, WITH INSTRUMENTATION AND ALLOGRAFT;  Surgeon: Phylliss Bob, MD;  Location: [place];  Service: Orthopedics;  Laterality: N/A;  . COLONOSCOPY WITH ESOPHAGOGASTRODUODENOSCOPY (EGD)  05/04/2009  . PIP JOINT FUSION Bilateral    pinky   Social History: Social History   Socioeconomic History  . Marital status: Married    Spouse name: Not on file  . Number of children: 2  . Years of education: Not on file  . Highest education level: Not on file  Occupational History  . Occupation: Tourist information centre manager: [employer]   Tobacco Use  . Smoking status: Never Smoker  . Smokeless tobacco: Never Used  Substance and Sexual Activity  . Alcohol use: No  . Drug use: No  . Sexual activity: Yes    Partners: Male  Other Topics Concern  . Not on file  Social History Narrative  . Not on file   Social Determinants of Health   Financial Resource Strain:   . Difficulty of Paying Living Expenses:   Food Insecurity:   . Worried About Charity fundraiser in the Last Year:   . Arboriculturist in the Last Year:   Transportation  Needs:   . Film/video editor (Medical):   Marland Kitchen Lack of Transportation (Non-Medical):   Physical Activity:   . Days of Exercise per Week:   . Minutes of Exercise per Session:   Stress:   . Feeling of Stress :   Social Connections:   . Frequency of Communication with Friends and Family:   . Frequency of Social Gatherings with Friends and Family:   . Attends Religious Services:   . Active Member of Clubs or Organizations:   . Attends Archivist Meetings:   Marland Kitchen Marital Status:    Family History: Family History  Problem Relation Age of Onset  . Diabetes Mother   . Dementia Mother   . High blood pressure Mother   . Heart attack Mother   . Colon cancer Mother   . Ovarian cancer Mother   . Heart attack Father   . Diabetes Father   . Heart attack Brother   . High blood pressure Brother   . Diabetes Brother   . Epilepsy Brother   . Colon polyps Daughter 80  . Esophageal cancer Neg Hx   . Rectal cancer Neg Hx   . Stomach cancer Neg Hx   . Heart disease Neg Hx    Allergies: Allergies  Allergen Reactions  . Adhesive [Tape] Hives    Welts--PLEASE USE HYPO  ALLERGIC TAPE  . Ampicillin Diarrhea    Did it involve swelling of the face/tongue/throat, SOB, or low BP? No Did it involve sudden or severe rash/hives, skin peeling, or any reaction on the inside of your mouth or nose? No Did you need to seek medical attention at a hospital or doctor's office? No When did it last happen?30-40 years ago If all above answers are "NO", may proceed with cephalosporin use.   Marland Kitchen Cefprozil Other (See Comments)    Acid reflux/indigestion issues  . Meloxicam Other (See Comments)    Acid reflux/indigestion issues   Medications: See med rec.  Review of Systems: No fevers, chills, night sweats, weight loss, chest pain, or shortness of breath.   Objective:    General: Well Developed, well nourished, and in no acute distress.  Neuro: Alert and oriented x3.  HEENT: Normocephalic,  atraumatic.  Skin: Warm and dry. Cardiac: Regular rate and rhythm, no murmurs rubs or gallops, no lower extremity edema.  Respiratory: Clear to auscultation bilaterally. Not using accessory muscles, speaking in full sentences.   Impression and Recommendations:    No problem-specific Assessment & Plan notes found for this encounter.  No follow-ups on file. ___________________________________________ Clearnce Sorrel, DNP, APRN, FNP-BC Primary Care and [place]

## 2019-05-24 NOTE — Telephone Encounter (Signed)
She stated Dr Sheppard Coil agreed to take over her refills. Patient has been scheduled.

## 2019-05-24 NOTE — Telephone Encounter (Signed)
Can we find out who prescribes this usually for her? If she wants [place] to take it over, she will need an appointment to discuss this.  Thanks, Caryl Asp

## 2019-05-25 ENCOUNTER — Ambulatory Visit (INDEPENDENT_AMBULATORY_CARE_PROVIDER_SITE_OTHER): Payer: BC Managed Care – PPO | Admitting: Medical-Surgical

## 2019-05-25 ENCOUNTER — Ambulatory Visit: Payer: BC Managed Care – PPO | Admitting: Psychology

## 2019-05-25 DIAGNOSIS — Z5329 Procedure and treatment not carried out because of patient's decision for other reasons: Secondary | ICD-10-CM

## 2019-05-25 DIAGNOSIS — Z114 Encounter for screening for human immunodeficiency virus [HIV]: Secondary | ICD-10-CM

## 2019-05-31 ENCOUNTER — Telehealth: Payer: Self-pay

## 2019-05-31 DIAGNOSIS — R079 Chest pain, unspecified: Secondary | ICD-10-CM

## 2019-05-31 NOTE — Telephone Encounter (Signed)
I cardiology is recommending a catheterization, this is concerning and she should get this done ASAP, referral to [facility] ordered but this may delay her care. Please advise her to reach out to her current cardiologist again.

## 2019-05-31 NOTE — Telephone Encounter (Signed)
Pt has been updated of provider's note and referral. She has contacted the cardiologist office - feels like she is getting the run around. Pt aware to contact the office if she doesn't receive a call to schedule an appt with [facility] cardiologist.

## 2019-05-31 NOTE — Telephone Encounter (Signed)
Pt called stating she had a echo stress test with cardiologist at [facility]. No one has contacted her for scheduling. Pt continues to have on/off chest pains. Pt was informed by cardiologist that she will need to have a cardiac catheterization completed. She was informed that present cardiologist does not perform this procedure. Requesting a new referral, pt prefers to have procedure at a [facility] location. Pt requesting to expedite the referral because she has been waiting for two weeks to schedule an appointment. New referral pended for provider review.

## 2019-06-01 ENCOUNTER — Telehealth: Payer: Self-pay | Admitting: Cardiology

## 2019-06-01 NOTE — Telephone Encounter (Signed)
New Message   Pt is calling and is wondering how she can upload a copy of an echo she had done    Please call back

## 2019-06-01 NOTE — Telephone Encounter (Signed)
Spoke with the pt who states that she had an echo at [facility] and would like to make sure we have a copy for her appointment of Friday. I have faxed a letter to request this information.

## 2019-06-02 ENCOUNTER — Telehealth: Payer: Self-pay | Admitting: Cardiology

## 2019-06-02 NOTE — Telephone Encounter (Signed)
Spoke with pt and informed her that her records had been received and that her appointment had been moved to 2:50 Friday. Pt verbalized understanding and had no additional questions.

## 2019-06-02 NOTE — Telephone Encounter (Signed)
Patient states she is returning Tonya Chandler phone call about an earlier appointment? Please advise.

## 2019-06-03 ENCOUNTER — Other Ambulatory Visit: Payer: Self-pay

## 2019-06-04 ENCOUNTER — Encounter: Payer: Self-pay | Admitting: Cardiology

## 2019-06-04 ENCOUNTER — Ambulatory Visit (INDEPENDENT_AMBULATORY_CARE_PROVIDER_SITE_OTHER): Payer: BC Managed Care – PPO | Admitting: Cardiology

## 2019-06-04 ENCOUNTER — Other Ambulatory Visit: Payer: Self-pay

## 2019-06-04 ENCOUNTER — Ambulatory Visit (HOSPITAL_BASED_OUTPATIENT_CLINIC_OR_DEPARTMENT_OTHER)
Admission: RE | Admit: 2019-06-04 | Discharge: 2019-06-04 | Disposition: A | Discharge status: Home / Self Care | Payer: BC Managed Care – PPO | Source: Ambulatory Visit | Attending: Cardiology | Admitting: Cardiology

## 2019-06-04 ENCOUNTER — Ambulatory Visit: Payer: BC Managed Care – PPO | Admitting: Cardiology

## 2019-06-04 VITALS — BP 144/84 | HR 81 | Ht 60.0 in | Wt 158.0 lb

## 2019-06-04 DIAGNOSIS — I209 Angina pectoris, unspecified: Secondary | ICD-10-CM | POA: Insufficient documentation

## 2019-06-04 DIAGNOSIS — E785 Hyperlipidemia, unspecified: Secondary | ICD-10-CM

## 2019-06-04 DIAGNOSIS — E1140 Type 2 diabetes mellitus with diabetic neuropathy, unspecified: Secondary | ICD-10-CM | POA: Diagnosis not present

## 2019-06-04 DIAGNOSIS — R079 Chest pain, unspecified: Secondary | ICD-10-CM | POA: Diagnosis not present

## 2019-06-04 DIAGNOSIS — R9439 Abnormal result of other cardiovascular function study: Secondary | ICD-10-CM

## 2019-06-04 DIAGNOSIS — Z794 Long term (current) use of insulin: Secondary | ICD-10-CM

## 2019-06-04 HISTORY — DX: Angina pectoris, unspecified: I20.9

## 2019-06-04 MED ORDER — ASPIRIN EC 81 MG PO TBEC
81.0000 mg | DELAYED_RELEASE_TABLET | Freq: Every day | ORAL | 3 refills | Status: DC
Start: 2019-06-04 — End: 2019-06-20

## 2019-06-04 NOTE — Patient Instructions (Addendum)
Medication Instructions:  Your physician has recommended you make the following change in your medication:   START: Aspirin 81 mg daily   *If you need a refill on your cardiac medications before your next appointment, please call your pharmacy*   Lab Work: Your physician recommends that you return for lab work today: bmp, cbc   If you have labs (blood work) drawn today and your tests are completely normal, you will receive your results only by: Tonya Chandler MyChart Message (if you have MyChart) OR . A paper copy in the mail If you have any lab test that is abnormal or we need to change your treatment, we will call you to review the results.   Testing/Procedures: A chest x-ray takes a picture of the organs and structures inside the chest, including the heart, lungs, and blood vessels. This test can show several things, including, whether the heart is enlarges; whether fluid is building up in the lungs; and whether pacemaker / defibrillator leads are still in place.       [facility] [place] [address] [place], [place] [address] Dept: [phone] Loc: [phone]  [name]  06/04/2019  You are scheduled for a Cardiac Catheterization on Friday, May 21 with Dr. Kathlyn Chandler.  1. Please arrive at the [facility] (Main Entrance A) at [facility]: [address] [place], [place] [address] at 5:30 AM (This time is two hours before your procedure to ensure your preparation). Free valet parking service is available.   Special note: Every effort is made to have your procedure done on time. Please understand that emergencies sometimes delay scheduled procedures.  2. Diet: Do not eat solid foods after midnight.  The patient may have clear liquids until 5am upon the day of the procedure.  3. Labs: You will need to have labs drawn today.  4. Medication instructions in preparation for your procedure:       Hold the morning of the procedure: invokana, trulicitym humalog,and  triamterence-hydrochlorothiazide   Take only: 34 units of lantus the night before the cath.   On the morning of your procedure, take your Aspirin and any morning medicines NOT listed above.  You may use sips of water.  5. Plan for one night stay--bring personal belongings. 6. Bring a current list of your medications and current insurance cards. 7. You MUST have a responsible person to drive you home. 8. Someone MUST be with you the first 24 hours after you arrive home or your discharge will be delayed. 9. Please wear clothes that are easy to get on and off and wear slip-on shoes.  Thank you for allowing [place] to care for you!   -- [place] [facility]   Follow-Up: At [facility], you and your health needs are our priority.  As part of our continuing mission to provide you with exceptional heart care, we have created designated Provider Care Teams.  These Care Teams include your primary Cardiologist (physician) and Advanced Practice Providers (APPs -  Physician Assistants and Nurse Practitioners) who all work together to provide you with the care you need, when you need it.  We recommend signing up for the patient portal called "MyChart".  Sign up information is provided on this After Visit Summary.  MyChart is used to connect with patients for Virtual Visits (Telemedicine).  Patients are able to view lab/test results, encounter notes, upcoming appointments, etc.  Non-urgent messages can  be sent to your provider as well.   To learn more about what you can do with MyChart, go to [email or web address].    Your next appointment:   1 month(s)  The format for your next appointment:   In Person  Provider:   Jenne Campus, MD   Other Instructions   Coronary Angiogram With Stent Coronary angiogram with stent placement is a procedure to widen or open a narrow blood vessel of the heart  (coronary artery). Arteries may become blocked by cholesterol buildup (plaques) in the lining of the artery wall. When a coronary artery becomes partially blocked, blood flow to that area decreases. This may lead to chest pain or a heart attack (myocardial infarction). A stent is a small piece of metal that looks like mesh or spring. Stent placement may be done as treatment after a heart attack, or to prevent a heart attack if a blocked artery is found by a coronary angiogram. Let your health care provider know about:  Any allergies you have, including allergies to medicines or contrast dye.  All medicines you are taking, including vitamins, herbs, eye drops, creams, and over-the-counter medicines.  Any problems you or family members have had with anesthetic medicines.  Any blood disorders you have.  Any surgeries you have had.  Any medical conditions you have, including kidney problems or kidney failure.  Whether you are pregnant or may be pregnant.  Whether you are breastfeeding. What are the risks? Generally, this is a safe procedure. However, serious problems may occur, including:  Damage to nearby structures or organs, such as the heart, blood vessels, or kidneys.  A return of blockage.  Bleeding, infection, or bruising at the insertion site.  A collection of blood under the skin (hematoma) at the insertion site.  A blood clot in another part of the body.  Allergic reaction to medicines or dyes.  Bleeding into the abdomen (retroperitoneal bleeding).  Stroke (rare).  Heart attack (rare). What happens before the procedure? Staying hydrated Follow instructions from your health care provider about hydration, which may include:  Up to 2 hours before the procedure - you may continue to drink clear liquids, such as water, clear fruit juice, black coffee, and plain tea.  Eating and drinking restrictions Follow instructions from your health care provider about eating and  drinking, which may include:  8 hours before the procedure - stop eating heavy meals or foods, such as meat, fried foods, or fatty foods.  6 hours before the procedure - stop eating light meals or foods, such as toast or cereal.  2 hours before the procedure - stop drinking clear liquids. Medicines Ask your health care provider about:  Changing or stopping your regular medicines. This is especially important if you are taking diabetes medicines or blood thinners.  Taking medicines such as aspirin and ibuprofen. These medicines can thin your blood. Do not take these medicines unless your health care provider tells you to take them. ? Generally, aspirin is recommended before a thin tube, called a catheter, is passed through a blood vessel and inserted into the heart (cardiac catheterization).  Taking over-the-counter medicines, vitamins, herbs, and supplements. General instructions  Do not use any products that contain nicotine or tobacco for at least 4 weeks before the procedure. These products include cigarettes, e-cigarettes, and chewing tobacco. If you need help quitting, ask your health care provider.  Plan to have someone take you home from the hospital or clinic.  If you will be going home  right after the procedure, plan to have someone with you for 24 hours.  You may have tests and imaging procedures.  Ask your health care provider: ? How your insertion site will be marked. Ask which artery will be used for the procedure. ? What steps will be taken to help prevent infection. These may include:  Removing hair at the insertion site.  Washing skin with a germ-killing soap.  Taking antibiotic medicine. What happens during the procedure?   An IV will be inserted into one of your veins.  Electrodes may be placed on your chest to monitor your heart rate during the procedure.  You will be given one or more of the following: ? A medicine to help you relax (sedative). ? A  medicine to numb the area (local anesthetic) for catheter insertion.  A small incision will be made for catheter insertion.  The catheter will be inserted into an artery using a guide wire. The location may be in your groin, your wrist, or the fold of your arm (near your elbow).  An X-ray procedure (fluoroscopy) will be used to help guide the catheter to the opening of the heart arteries.  A dye will be injected into the catheter. X-rays will be taken. The dye helps to show where any narrowing or blockages are located in the arteries.  Tell your health care provider if you have chest pain or trouble breathing.  A tiny wire will be guided to the blocked spot, and a balloon will be inflated to make the artery wider.  The stent will be expanded to crush the plaques into the wall of the vessel. The stent will hold the area open and improve the blood flow. Most stents have a drug coating to reduce the risk of the stent narrowing over time.  The artery may be made wider using a drill, laser, or other tools that remove plaques.  The catheter will be removed when the blood flow improves. The stent will stay where it was placed, and the lining of the artery will grow over it.  A bandage (dressing) will be placed on the insertion site. Pressure will be applied to stop bleeding.  The IV will be removed. This procedure may vary among health care providers and hospitals. What happens after the procedure?  Your blood pressure, heart rate, breathing rate, and blood oxygen level will be monitored until you leave the hospital or clinic.  If the procedure is done through the leg, you will lie flat in bed for a few hours or for as long as told by your health care provider. You will be instructed not to bend or cross your legs.  The insertion site and the pulse in your foot or wrist will be checked often.  You may have more blood tests, X-rays, and a test that records the electrical activity of your  heart (electrocardiogram, or ECG).  Do not drive for 24 hours if you were given a sedative during your procedure. Summary  Coronary angiogram with stent placement is a procedure to widen or open a narrowed coronary artery. This is done to treat heart problems.  Before the procedure, let your health care provider know about all the medical conditions and surgeries you have or have had.  This is a safe procedure. However, some problems may occur, including damage to nearby structures or organs, bleeding, blood clots, or allergies.  Follow your health care provider's instructions about eating, drinking, medicines, and other lifestyle changes, such as quitting  tobacco use before the procedure. This information is not intended to replace advice given to you by your health care provider. Make sure you discuss any questions you have with your health care provider. Document Revised: 07/29/2018 Document Reviewed: 07/29/2018 Elsevier Patient Education  [place].

## 2019-06-04 NOTE — H&P (View-Only) (Signed)
Cardiology Consultation:    Date:  06/04/2019   ID:  Tonya Chandler, DOB 11/07/1960, MRN [id number]  PCP:  Emeterio Reeve, DO  Cardiologist:  Jenne Campus, MD   Referring MD: Emeterio Reeve, DO   Chief Complaint  Patient presents with  . New Patient (Initial Visit)  I have abnormal stress test  History of Present Illness:    Tonya Chandler is a 59 y.o. female who is being seen today for the evaluation of chest pain at the request of Emeterio Reeve, DO.  Past medical history significant for diabetes mellitus type 2, dyslipidemia, hypertension, coronary artery disease.  She tells me that about 10 years ago she had cardiac catheterization done which showed 30% lesion in different arteries recently she started having some issue with chest pain.  Pain is somewhat atypical can happen at rest can happen with exercise sometimes pressure sometimes heavy like sensation lasting for few minutes only mild however goes to neck as well as to pressure shoulder.  She ended up having a stress test done by Dr. Hamilton Capri which showed significant abnormalities she walk only 5 minutes on the treadmill she had to stop because of shortness of breath she did have significant ST segment depression at the peak exercise her resting echocardiogram has been described as inferior wall hypokinesis however at peak exercise hypokinesis became akinesis of the inferior wall as well as there was septum akinesis and overall decline in left ventricle ejection fraction.  She was recommended to have a cardiac catheterization however she still waiting for scheduling the procedure, eventually got upset and schedule visit with [place] to make arrangements for cardiac catheterization.  Past Medical History:  Diagnosis Date  . Abnormal ECG 04/01/2018  . Abnormal stress echocardiogram 05/24/2019   Formatting of this note might be different from the original. Added automatically from request for surgery [id number]  . Anemia   . Anxiety   .  Atherosclerosis of native coronary artery with angina pectoris ([place]) 05/10/2015   Formatting of this note might be different from the original. 30% lesion-Chiu  . Bilateral carpal tunnel syndrome 05/23/2017  . Bilateral knee pain 02/16/2010   Qualifier: Diagnosis of  By: Koleen Nimrod MD, Dellis Filbert    . Carpal tunnel syndrome, right 12/16/2017  . Colon polyps   . Depression   . DEPRESSION 02/16/2010   Qualifier: Diagnosis of  By: Koleen Nimrod MD, Dellis Filbert    . Depression, major, in partial remission ([place]) 07/14/2015  . Diabetes mellitus   . Diverticulosis   . Dupuytren's contracture of right hand 06/24/2017  . Essential hypertension 02/16/2010   Qualifier: Diagnosis of  By: Koleen Nimrod MD, Dellis Filbert    . Gastro-esophageal reflux   . Gastrointestinal ulcer   . Generalized anxiety disorder 03/14/2015  . GERD 02/16/2010   Qualifier: Diagnosis of  By: Koleen Nimrod MD, Dellis Filbert    . H/O: GI bleed    Michela Pitcher it was due to taking metformin  . Hyperlipidemia   . Hyperlipidemia LDL goal <70 02/16/2010   Qualifier: Diagnosis of  By: Koleen Nimrod MD, Dellis Filbert    . Hypertension   . IBS (irritable bowel syndrome)   . Impingement syndrome of shoulder, right 06/24/2017  . Midline low back pain with left-sided sciatica 12/29/2013  . Musculoskeletal chest pain 05/04/2015  . Obesity   . Precordial pain 05/04/2015  . Primary insomnia 03/14/2015  . Radiculitis of right cervical region 01/20/2018  . Radiculopathy 04/02/2018  . Right hand pain 06/24/2017  . RLS (restless legs syndrome) 07/14/2015  .  Type 2 diabetes mellitus with diabetic neuropathy, with long-term current use of insulin ([place]) 02/16/2010   Qualifier: Diagnosis of  By: Koleen Nimrod MD, Dellis Filbert      Past Surgical History:  Procedure Laterality Date  . ABDOMINAL HYSTERECTOMY    . ANTERIOR CERVICAL DECOMP/DISCECTOMY FUSION N/A 04/02/2018   Procedure: ANTERIOR CERVICAL DECOMPRESSION FUSION, CERVICAL FOUR-FIVE, CERVICAL FIVE-SIX, WITH INSTRUMENTATION AND ALLOGRAFT;  Surgeon:  Phylliss Bob, MD;  Location: [facility];  Service: Orthopedics;  Laterality: N/A;  . COLONOSCOPY WITH ESOPHAGOGASTRODUODENOSCOPY (EGD)  05/04/2009  . PIP JOINT FUSION Bilateral    pinky    Current Medications: Current Meds  Medication Sig  . atorvastatin (LIPITOR) 40 MG tablet Take 1 tablet (40 mg total) by mouth daily.  . Cyanocobalamin (VITAMIN B-12) 2500 MCG SUBL Place 2,500 mcg under the tongue daily.  . cyclobenzaprine (FLEXERIL) 10 MG tablet Take 1 tablet (10 mg total) by mouth at bedtime.  . Dulaglutide (TRULICITY) 1.5 0000000 SOPN Inject 1.5 mg into the skin every Sunday.  . DULoxetine (CYMBALTA) 60 MG capsule Take 1 capsule (60 mg total) by mouth daily.  Marland Kitchen esomeprazole (NEXIUM) 40 MG capsule Take 40 mg by mouth daily at 12 noon.  Marland Kitchen estradiol (ESTRACE) 1 MG tablet Take 1 mg by mouth at bedtime.   . Famotidine 20 MG CHEW Chew 1 tablet by mouth as needed.  . gabapentin (NEURONTIN) 800 MG tablet 1 tab in the morning, 1 tab midday, 2 tabs at bedtime  . Insulin Glargine (BASAGLAR KWIKPEN) 100 UNIT/ML Inject 0.54 mLs (54 Units total) into the skin daily.  . insulin lispro (HUMALOG KWIKPEN) 200 UNIT/ML KwikPen Inject 10-15 units as directed prior to lunch and dinner  . INVOKANA 300 MG TABS tablet Take 1 tablet (300 mg total) by mouth daily.  . magnesium oxide (MAG-OX) 400 MG tablet Take 400 mg by mouth at bedtime.  . mirtazapine (REMERON) 45 MG tablet Take 45 mg by mouth at bedtime.   . nitroGLYCERIN (NITROSTAT) 0.4 MG SL tablet Place 1 tablet (0.4 mg total) under the tongue every 5 (five) minutes as needed for chest pain.  Marland Kitchen rOPINIRole (REQUIP) 0.5 MG tablet Take 3 tablets (1.5 mg total) by mouth at bedtime.  . trandolapril-verapamil (TARKA) 2-240 MG tablet Take 1 tablet by mouth daily.  Marland Kitchen triamterene-hydrochlorothiazide (MAXZIDE-25) 37.5-25 MG tablet Take 1 tablet by mouth daily.     Allergies:   Adhesive [tape], Ampicillin, Cefprozil, and Meloxicam   Social History   Socioeconomic  History  . Marital status: Married    Spouse name: Not on file  . Number of children: 2  . Years of education: Not on file  . Highest education level: Not on file  Occupational History  . Occupation: Tourist information centre manager: [employer]   Tobacco Use  . Smoking status: Never Smoker  . Smokeless tobacco: Never Used  Substance and Sexual Activity  . Alcohol use: No  . Drug use: No  . Sexual activity: Yes    Partners: Male  Other Topics Concern  . Not on file  Social History Narrative  . Not on file   Social Determinants of Health   Financial Resource Strain:   . Difficulty of Paying Living Expenses:   Food Insecurity:   . Worried About Charity fundraiser in the Last Year:   . Arboriculturist in the Last Year:   Transportation Needs:   . Film/video editor (Medical):   Marland Kitchen Lack of Transportation (Non-Medical):  Physical Activity:   . Days of Exercise per Week:   . Minutes of Exercise per Session:   Stress:   . Feeling of Stress :   Social Connections:   . Frequency of Communication with Friends and Family:   . Frequency of Social Gatherings with Friends and Family:   . Attends Religious Services:   . Active Member of Clubs or Organizations:   . Attends Archivist Meetings:   Marland Kitchen Marital Status:      Family History: [name] family history includes Colon cancer in her mother; Colon polyps (age of onset: 78) in her daughter; Dementia in her mother; Diabetes in her brother, father, and mother; Epilepsy in her brother; Heart attack in her brother, father, and mother; High blood pressure in her brother and mother; Ovarian cancer in her mother. There is no history of Esophageal cancer, Rectal cancer, Stomach cancer, or Heart disease. ROS:   Please see the history of present illness.    All 14 point review of systems negative except as described per history of present illness.  EKGs/Labs/Other Studies Reviewed:    The following studies were reviewed  today: Echocardiogram description abov  EKG:  EKG is  ordered today.  The ekg ordered today demonstrates   Recent Labs: 09/30/2018: ALT 19; BUN 13; Creat 0.60; Hemoglobin 16.6; Platelets 372; Potassium 3.8; Sodium 139  Recent Lipid Panel    Component Value Date/Time   CHOL 216 (H) 09/30/2018 1218   TRIG 421 (H) 09/30/2018 1218   HDL 58 09/30/2018 1218   CHOLHDL 3.7 09/30/2018 1218   [place]  09/30/2018 1218     Comment:     . LDL cholesterol not calculated. Triglyceride levels greater than 400 mg/dL invalidate calculated LDL results. . Reference range: <100 . Desirable range <100 mg/dL for primary prevention;   <70 mg/dL for patients with CHD or diabetic patients  with > or = 2 CHD risk factors. Marland Kitchen LDL-C is now calculated using the Martin-Hopkins  calculation, which is a validated novel method providing  better accuracy than the Friedewald equation in the  estimation of LDL-C.  Cresenciano Genre et al. Annamaria Helling. [id number]): 2061-2068  (http://education.[facility].com/faq/FAQ164)     Physical Exam:    VS:  BP (!) 144/84   Pulse 81   Ht 5' (1.524 m)   Wt 158 lb (71.7 kg)   SpO2 94%   BMI 30.86 kg/m     Wt Readings from Last 3 Encounters:  06/04/19 158 lb (71.7 kg)  04/22/19 159 lb 1.9 oz (72.2 kg)  04/05/19 160 lb 0.6 oz (72.6 kg)     GEN:  Well nourished, well developed in no acute distress HEENT: Normal NECK: No JVD; No carotid bruits LYMPHATICS: No lymphadenopathy CARDIAC: RRR, no murmurs, no rubs, no gallops RESPIRATORY:  Clear to auscultation without rales, wheezing or rhonchi  ABDOMEN: Soft, non-tender, non-distended MUSCULOSKELETAL:  No edema; No deformity  SKIN: Warm and dry NEUROLOGIC:  Alert and oriented x 3 PSYCHIATRIC:  Normal affect   ASSESSMENT:    1. Abnormal stress test   2. Angina pectoris ([place])   3. Hyperlipidemia LDL goal <70   4. Type 2 diabetes mellitus with diabetic neuropathy, with long-term current use of insulin (HCC)    PLAN:     In order of problems listed above:  1. Abnormal stress test showing potentially old myocardial infarction with ischemia involving inferior wall as well as septum.  Also with stress echocardiogram there was enlargement of left ventricle.  Overall looks like this is a high risk test.  The best approach to this will be to proceed with cardiac catheterization.  She did have already explanation of cardiac catheterizations done by her prior cardiologist however I review indications as well as procedure with her 1 more time including all risk benefits as well as alternatives.  She is willing to proceed.  She does have nitroglycerin she knows how to use it she is taking aspirin as well as statin which I will continue. 2. Dyslipidemia: She is on high intensity statin she is taking Lipitor 40, I did review K PN as well as laboratory test from [facility] and I do not see any recent fasting lipid profile.  We will make arrangements for the test. 3. Diabetes mellitus her hemoglobin A1c is 9.2 clearly she need to take better care of of herself.  We will continue this discussion after cardiac catheterization will be done.   Medication Adjustments/Labs and Tests Ordered: Current medicines are reviewed at length with the patient today.  Concerns regarding medicines are outlined above.  Orders Placed This Encounter  Procedures  . DG Chest 2 View  . CBC  . Basic metabolic panel  . EKG 12-Lead   Meds ordered this encounter  Medications  . aspirin EC 81 MG tablet    Sig: Take 1 tablet (81 mg total) by mouth daily.    Dispense:  90 tablet    Refill:  3    Signed, Park Liter, MD, [facility]. 06/04/2019 4:54 PM     [facility]

## 2019-06-04 NOTE — Progress Notes (Signed)
Cardiology Consultation:    Date:  06/04/2019   ID:  [name], DOB [birth year], MRN [id number]  PCP:  Emeterio Reeve, DO  Cardiologist:  Jenne Campus, MD   Referring MD: Emeterio Reeve, DO   Chief Complaint  Patient presents with  . New Patient (Initial Visit)  I have abnormal stress test  History of Present Illness:    Tonya Chandler is a 59 y.o. female who is being seen today for the evaluation of chest pain at the request of Emeterio Reeve, DO.  Past medical history significant for diabetes mellitus type 2, dyslipidemia, hypertension, coronary artery disease.  She tells me that about 10 years ago she had cardiac catheterization done which showed 30% lesion in different arteries recently she started having some issue with chest pain.  Pain is somewhat atypical can happen at rest can happen with exercise sometimes pressure sometimes heavy like sensation lasting for few minutes only mild however goes to neck as well as to pressure shoulder.  She ended up having a stress test done by Dr. Hamilton Capri which showed significant abnormalities she walk only 5 minutes on the treadmill she had to stop because of shortness of breath she did have significant ST segment depression at the peak exercise her resting echocardiogram has been described as inferior wall hypokinesis however at peak exercise hypokinesis became akinesis of the inferior wall as well as there was septum akinesis and overall decline in left ventricle ejection fraction.  She was recommended to have a cardiac catheterization however she still waiting for scheduling the procedure, eventually got upset and schedule visit with [place] to make arrangements for cardiac catheterization.  Past Medical History:  Diagnosis Date  . Abnormal ECG 04/01/2018  . Abnormal stress echocardiogram 05/24/2019   Formatting of this note might be different from the original. Added automatically from request for surgery [id number]  . Anemia   . Anxiety   .  Atherosclerosis of native coronary artery with angina pectoris ([place]) 05/10/2015   Formatting of this note might be different from the original. 30% lesion-Chiu  . Bilateral carpal tunnel syndrome 05/23/2017  . Bilateral knee pain 02/16/2010   Qualifier: Diagnosis of  By: Koleen Nimrod MD, Dellis Filbert    . Carpal tunnel syndrome, right 12/16/2017  . Colon polyps   . Depression   . DEPRESSION 02/16/2010   Qualifier: Diagnosis of  By: Koleen Nimrod MD, Dellis Filbert    . Depression, major, in partial remission ([place]) 07/14/2015  . Diabetes mellitus   . Diverticulosis   . Dupuytren's contracture of right hand 06/24/2017  . Essential hypertension 02/16/2010   Qualifier: Diagnosis of  By: Koleen Nimrod MD, Dellis Filbert    . Gastro-esophageal reflux   . Gastrointestinal ulcer   . Generalized anxiety disorder 03/14/2015  . GERD 02/16/2010   Qualifier: Diagnosis of  By: Koleen Nimrod MD, Dellis Filbert    . H/O: GI bleed    Michela Pitcher it was due to taking metformin  . Hyperlipidemia   . Hyperlipidemia LDL goal <70 02/16/2010   Qualifier: Diagnosis of  By: Koleen Nimrod MD, Dellis Filbert    . Hypertension   . IBS (irritable bowel syndrome)   . Impingement syndrome of shoulder, right 06/24/2017  . Midline low back pain with left-sided sciatica 12/29/2013  . Musculoskeletal chest pain 05/04/2015  . Obesity   . Precordial pain 05/04/2015  . Primary insomnia 03/14/2015  . Radiculitis of right cervical region 01/20/2018  . Radiculopathy 04/02/2018  . Right hand pain 06/24/2017  . RLS (restless legs syndrome) 07/14/2015  .  Type 2 diabetes mellitus with diabetic neuropathy, with long-term current use of insulin ([place]) 02/16/2010   Qualifier: Diagnosis of  By: Koleen Nimrod MD, Dellis Filbert      Past Surgical History:  Procedure Laterality Date  . ABDOMINAL HYSTERECTOMY    . ANTERIOR CERVICAL DECOMP/DISCECTOMY FUSION N/A 04/02/2018   Procedure: ANTERIOR CERVICAL DECOMPRESSION FUSION, CERVICAL FOUR-FIVE, CERVICAL FIVE-SIX, WITH INSTRUMENTATION AND ALLOGRAFT;  Surgeon:  Phylliss Bob, MD;  Location: [facility];  Service: Orthopedics;  Laterality: N/A;  . COLONOSCOPY WITH ESOPHAGOGASTRODUODENOSCOPY (EGD)  05/04/2009  . PIP JOINT FUSION Bilateral    pinky    Current Medications: Current Meds  Medication Sig  . atorvastatin (LIPITOR) 40 MG tablet Take 1 tablet (40 mg total) by mouth daily.  . Cyanocobalamin (VITAMIN B-12) 2500 MCG SUBL Place 2,500 mcg under the tongue daily.  . cyclobenzaprine (FLEXERIL) 10 MG tablet Take 1 tablet (10 mg total) by mouth at bedtime.  . Dulaglutide (TRULICITY) 1.5 0000000 SOPN Inject 1.5 mg into the skin every Sunday.  . DULoxetine (CYMBALTA) 60 MG capsule Take 1 capsule (60 mg total) by mouth daily.  Marland Kitchen esomeprazole (NEXIUM) 40 MG capsule Take 40 mg by mouth daily at 12 noon.  Marland Kitchen estradiol (ESTRACE) 1 MG tablet Take 1 mg by mouth at bedtime.   . Famotidine 20 MG CHEW Chew 1 tablet by mouth as needed.  . gabapentin (NEURONTIN) 800 MG tablet 1 tab in the morning, 1 tab midday, 2 tabs at bedtime  . Insulin Glargine (BASAGLAR KWIKPEN) 100 UNIT/ML Inject 0.54 mLs (54 Units total) into the skin daily.  . insulin lispro (HUMALOG KWIKPEN) 200 UNIT/ML KwikPen Inject 10-15 units as directed prior to lunch and dinner  . INVOKANA 300 MG TABS tablet Take 1 tablet (300 mg total) by mouth daily.  . magnesium oxide (MAG-OX) 400 MG tablet Take 400 mg by mouth at bedtime.  . mirtazapine (REMERON) 45 MG tablet Take 45 mg by mouth at bedtime.   . nitroGLYCERIN (NITROSTAT) 0.4 MG SL tablet Place 1 tablet (0.4 mg total) under the tongue every 5 (five) minutes as needed for chest pain.  Marland Kitchen rOPINIRole (REQUIP) 0.5 MG tablet Take 3 tablets (1.5 mg total) by mouth at bedtime.  . trandolapril-verapamil (TARKA) 2-240 MG tablet Take 1 tablet by mouth daily.  Marland Kitchen triamterene-hydrochlorothiazide (MAXZIDE-25) 37.5-25 MG tablet Take 1 tablet by mouth daily.     Allergies:   Adhesive [tape], Ampicillin, Cefprozil, and Meloxicam   Social History   Socioeconomic  History  . Marital status: Married    Spouse name: Not on file  . Number of children: 2  . Years of education: Not on file  . Highest education level: Not on file  Occupational History  . Occupation: Tourist information centre manager: [employer]   Tobacco Use  . Smoking status: Never Smoker  . Smokeless tobacco: Never Used  Substance and Sexual Activity  . Alcohol use: No  . Drug use: No  . Sexual activity: Yes    Partners: Male  Other Topics Concern  . Not on file  Social History Narrative  . Not on file   Social Determinants of Health   Financial Resource Strain:   . Difficulty of Paying Living Expenses:   Food Insecurity:   . Worried About Charity fundraiser in the Last Year:   . Arboriculturist in the Last Year:   Transportation Needs:   . Film/video editor (Medical):   Marland Kitchen Lack of Transportation (Non-Medical):  Physical Activity:   . Days of Exercise per Week:   . Minutes of Exercise per Session:   Stress:   . Feeling of Stress :   Social Connections:   . Frequency of Communication with Friends and Family:   . Frequency of Social Gatherings with Friends and Family:   . Attends Religious Services:   . Active Member of Clubs or Organizations:   . Attends Archivist Meetings:   Marland Kitchen Marital Status:      Family History: [name] family history includes Colon cancer in her mother; Colon polyps (age of onset: 35) in her daughter; Dementia in her mother; Diabetes in her brother, father, and mother; Epilepsy in her brother; Heart attack in her brother, father, and mother; High blood pressure in her brother and mother; Ovarian cancer in her mother. There is no history of Esophageal cancer, Rectal cancer, Stomach cancer, or Heart disease. ROS:   Please see the history of present illness.    All 14 point review of systems negative except as described per history of present illness.  EKGs/Labs/Other Studies Reviewed:    The following studies were reviewed  today: Echocardiogram description abov  EKG:  EKG is  ordered today.  The ekg ordered today demonstrates   Recent Labs: 09/30/2018: ALT 19; BUN 13; Creat 0.60; Hemoglobin 16.6; Platelets 372; Potassium 3.8; Sodium 139  Recent Lipid Panel    Component Value Date/Time   CHOL 216 (H) 09/30/2018 1218   TRIG 421 (H) 09/30/2018 1218   HDL 58 09/30/2018 1218   CHOLHDL 3.7 09/30/2018 1218   [place]  09/30/2018 1218     Comment:     . LDL cholesterol not calculated. Triglyceride levels greater than 400 mg/dL invalidate calculated LDL results. . Reference range: <100 . Desirable range <100 mg/dL for primary prevention;   <70 mg/dL for patients with CHD or diabetic patients  with > or = 2 CHD risk factors. Marland Kitchen LDL-C is now calculated using the Martin-Hopkins  calculation, which is a validated novel method providing  better accuracy than the Friedewald equation in the  estimation of LDL-C.  Cresenciano Genre et al. Annamaria Helling. [id number]): 2061-2068  (http://education.[facility].com/faq/FAQ164)     Physical Exam:    VS:  BP (!) 144/84   Pulse 81   Ht 5' (1.524 m)   Wt 158 lb (71.7 kg)   SpO2 94%   BMI 30.86 kg/m     Wt Readings from Last 3 Encounters:  06/04/19 158 lb (71.7 kg)  04/22/19 159 lb 1.9 oz (72.2 kg)  04/05/19 160 lb 0.6 oz (72.6 kg)     GEN:  Well nourished, well developed in no acute distress HEENT: Normal NECK: No JVD; No carotid bruits LYMPHATICS: No lymphadenopathy CARDIAC: RRR, no murmurs, no rubs, no gallops RESPIRATORY:  Clear to auscultation without rales, wheezing or rhonchi  ABDOMEN: Soft, non-tender, non-distended MUSCULOSKELETAL:  No edema; No deformity  SKIN: Warm and dry NEUROLOGIC:  Alert and oriented x 3 PSYCHIATRIC:  Normal affect   ASSESSMENT:    1. Abnormal stress test   2. Angina pectoris ([place])   3. Hyperlipidemia LDL goal <70   4. Type 2 diabetes mellitus with diabetic neuropathy, with long-term current use of insulin (HCC)    PLAN:     In order of problems listed above:  1. Abnormal stress test showing potentially old myocardial infarction with ischemia involving inferior wall as well as septum.  Also with stress echocardiogram there was enlargement of left ventricle.  Overall looks like this is a high risk test.  The best approach to this will be to proceed with cardiac catheterization.  She did have already explanation of cardiac catheterizations done by her prior cardiologist however I review indications as well as procedure with her 1 more time including all risk benefits as well as alternatives.  She is willing to proceed.  She does have nitroglycerin she knows how to use it she is taking aspirin as well as statin which I will continue. 2. Dyslipidemia: She is on high intensity statin she is taking Lipitor 40, I did review K PN as well as laboratory test from [facility] and I do not see any recent fasting lipid profile.  We will make arrangements for the test. 3. Diabetes mellitus her hemoglobin A1c is 9.2 clearly she need to take better care of of herself.  We will continue this discussion after cardiac catheterization will be done.   Medication Adjustments/Labs and Tests Ordered: Current medicines are reviewed at length with the patient today.  Concerns regarding medicines are outlined above.  Orders Placed This Encounter  Procedures  . DG Chest 2 View  . CBC  . Basic metabolic panel  . EKG 12-Lead   Meds ordered this encounter  Medications  . aspirin EC 81 MG tablet    Sig: Take 1 tablet (81 mg total) by mouth daily.    Dispense:  90 tablet    Refill:  3    Signed, Park Liter, MD, [facility]. 06/04/2019 4:54 PM    [facility]

## 2019-06-05 LAB — CBC
Hematocrit: 47.6 % — ABNORMAL HIGH (ref 34.0–46.6)
Hemoglobin: 16.1 g/dL — ABNORMAL HIGH (ref 11.1–15.9)
MCH: 29.7 pg (ref 26.6–33.0)
MCHC: 33.8 g/dL (ref 31.5–35.7)
MCV: 88 fL (ref 79–97)
Platelets: 345 10*3/uL (ref 150–450)
RBC: 5.42 x10E6/uL — ABNORMAL HIGH (ref 3.77–5.28)
RDW: 13 % (ref 11.7–15.4)
WBC: 10.3 10*3/uL (ref 3.4–10.8)

## 2019-06-05 LAB — BASIC METABOLIC PANEL
BUN/Creatinine Ratio: 19 (ref 12–28)
BUN: 12 mg/dL (ref 8–27)
CO2: 30 mmol/L — ABNORMAL HIGH (ref 20–29)
Calcium: 9.3 mg/dL (ref 8.7–10.3)
Chloride: 97 mmol/L (ref 96–106)
Creatinine, Ser: 0.63 mg/dL (ref 0.57–1.00)
GFR calc Af Amer: 113 mL/min/{1.73_m2} (ref >59)
GFR calc non Af Amer: 98 mL/min/{1.73_m2} (ref >59)
Glucose: 203 mg/dL — ABNORMAL HIGH (ref 65–99)
Potassium: 3.6 mmol/L (ref 3.5–5.2)
Sodium: 139 mmol/L (ref 134–144)

## 2019-06-08 ENCOUNTER — Ambulatory Visit: Payer: BC Managed Care – PPO | Admitting: Psychology

## 2019-06-08 ENCOUNTER — Other Ambulatory Visit (HOSPITAL_COMMUNITY)
Admission: RE | Admit: 2019-06-08 | Discharge: 2019-06-08 | Disposition: A | Discharge status: Home / Self Care | Payer: BC Managed Care – PPO | Source: Ambulatory Visit | Attending: Cardiovascular Disease | Admitting: Cardiovascular Disease

## 2019-06-08 DIAGNOSIS — F5101 Primary insomnia: Secondary | ICD-10-CM | POA: Diagnosis not present

## 2019-06-08 DIAGNOSIS — I252 Old myocardial infarction: Secondary | ICD-10-CM | POA: Diagnosis not present

## 2019-06-08 DIAGNOSIS — R110 Nausea: Secondary | ICD-10-CM | POA: Diagnosis not present

## 2019-06-08 DIAGNOSIS — I2510 Atherosclerotic heart disease of native coronary artery without angina pectoris: Secondary | ICD-10-CM | POA: Diagnosis not present

## 2019-06-08 DIAGNOSIS — E1140 Type 2 diabetes mellitus with diabetic neuropathy, unspecified: Secondary | ICD-10-CM | POA: Diagnosis not present

## 2019-06-08 DIAGNOSIS — J90 Pleural effusion, not elsewhere classified: Secondary | ICD-10-CM | POA: Diagnosis not present

## 2019-06-08 DIAGNOSIS — Z79890 Hormone replacement therapy (postmenopausal): Secondary | ICD-10-CM | POA: Diagnosis not present

## 2019-06-08 DIAGNOSIS — I10 Essential (primary) hypertension: Secondary | ICD-10-CM | POA: Diagnosis not present

## 2019-06-08 DIAGNOSIS — I2582 Chronic total occlusion of coronary artery: Secondary | ICD-10-CM | POA: Diagnosis not present

## 2019-06-08 DIAGNOSIS — K219 Gastro-esophageal reflux disease without esophagitis: Secondary | ICD-10-CM | POA: Diagnosis not present

## 2019-06-08 DIAGNOSIS — Z01810 Encounter for preprocedural cardiovascular examination: Secondary | ICD-10-CM | POA: Diagnosis not present

## 2019-06-08 DIAGNOSIS — Z20822 Contact with and (suspected) exposure to covid-19: Secondary | ICD-10-CM | POA: Diagnosis not present

## 2019-06-08 DIAGNOSIS — K589 Irritable bowel syndrome without diarrhea: Secondary | ICD-10-CM | POA: Diagnosis not present

## 2019-06-08 DIAGNOSIS — Z794 Long term (current) use of insulin: Secondary | ICD-10-CM | POA: Diagnosis not present

## 2019-06-08 DIAGNOSIS — J9811 Atelectasis: Secondary | ICD-10-CM | POA: Diagnosis not present

## 2019-06-08 DIAGNOSIS — I361 Nonrheumatic tricuspid (valve) insufficiency: Secondary | ICD-10-CM | POA: Diagnosis not present

## 2019-06-08 DIAGNOSIS — G2581 Restless legs syndrome: Secondary | ICD-10-CM | POA: Diagnosis not present

## 2019-06-08 DIAGNOSIS — E669 Obesity, unspecified: Secondary | ICD-10-CM | POA: Diagnosis not present

## 2019-06-08 DIAGNOSIS — Z90710 Acquired absence of both cervix and uterus: Secondary | ICD-10-CM | POA: Diagnosis not present

## 2019-06-08 DIAGNOSIS — E785 Hyperlipidemia, unspecified: Secondary | ICD-10-CM | POA: Diagnosis not present

## 2019-06-08 DIAGNOSIS — I517 Cardiomegaly: Secondary | ICD-10-CM | POA: Diagnosis not present

## 2019-06-08 DIAGNOSIS — Z01818 Encounter for other preprocedural examination: Secondary | ICD-10-CM | POA: Diagnosis not present

## 2019-06-08 DIAGNOSIS — Z881 Allergy status to other antibiotic agents status: Secondary | ICD-10-CM | POA: Diagnosis not present

## 2019-06-08 DIAGNOSIS — F411 Generalized anxiety disorder: Secondary | ICD-10-CM | POA: Diagnosis not present

## 2019-06-08 DIAGNOSIS — I340 Nonrheumatic mitral (valve) insufficiency: Secondary | ICD-10-CM | POA: Diagnosis not present

## 2019-06-08 DIAGNOSIS — Z981 Arthrodesis status: Secondary | ICD-10-CM | POA: Diagnosis not present

## 2019-06-08 DIAGNOSIS — E8770 Fluid overload, unspecified: Secondary | ICD-10-CM | POA: Diagnosis not present

## 2019-06-08 DIAGNOSIS — F324 Major depressive disorder, single episode, in partial remission: Secondary | ICD-10-CM | POA: Diagnosis not present

## 2019-06-08 DIAGNOSIS — Z79899 Other long term (current) drug therapy: Secondary | ICD-10-CM | POA: Diagnosis not present

## 2019-06-08 DIAGNOSIS — Z01812 Encounter for preprocedural laboratory examination: Secondary | ICD-10-CM | POA: Insufficient documentation

## 2019-06-08 DIAGNOSIS — E119 Type 2 diabetes mellitus without complications: Secondary | ICD-10-CM | POA: Diagnosis not present

## 2019-06-08 DIAGNOSIS — F329 Major depressive disorder, single episode, unspecified: Secondary | ICD-10-CM | POA: Diagnosis present

## 2019-06-08 DIAGNOSIS — E1165 Type 2 diabetes mellitus with hyperglycemia: Secondary | ICD-10-CM | POA: Diagnosis not present

## 2019-06-08 DIAGNOSIS — I25110 Atherosclerotic heart disease of native coronary artery with unstable angina pectoris: Secondary | ICD-10-CM | POA: Diagnosis not present

## 2019-06-08 DIAGNOSIS — J939 Pneumothorax, unspecified: Secondary | ICD-10-CM | POA: Diagnosis not present

## 2019-06-08 LAB — SARS CORONAVIRUS 2 (TAT 6-24 HRS): SARS Coronavirus 2: NEGATIVE

## 2019-06-10 ENCOUNTER — Telehealth: Payer: Self-pay | Admitting: *Deleted

## 2019-06-10 NOTE — Telephone Encounter (Signed)
Pt contacted pre-catheterization scheduled at [facility] for: Friday Jun 11, 2019 7:30 AM Verified arrival time and place: [facility]) at: 5:30 AM   No solid food after midnight prior to cath, clear liquids until 5 AM day of procedure.  Hold: Triamterene-HCT-AM of procedure Invokana-AM of procedure Insulin-AM of procedure/ 1/2 usual Insulin dose HS prior to procedure   Except hold medications AM meds can be  taken pre-cath with sip of water including: ASA 81 mg   Confirmed patient has responsible adult to drive home post procedure and observe 24 hours after arriving home: yes  You are allowed ONE visitor in the waiting room during your procedure. Both you and your visitor must wear masks.      COVID-19 Pre-Screening Questions:  . In the past 7 to 10 days have you had a cough,  shortness of breath, headache, congestion, fever (100 or greater) body aches, chills, sore throat, or sudden loss of taste or sense of smell? no . Have you been around anyone with known Covid 19 in the past 7 to 10 days? no . Have you been around anyone who is awaiting Covid 19 test results in the past 7 to 10 days? no . Have you been around anyone who has mentioned symptoms of Covid 19 within the past 7 to 10 days? no  Reviewed procedure/mask/visitor instructions, COVID-19 screening questions with patient.

## 2019-06-11 ENCOUNTER — Inpatient Hospital Stay (HOSPITAL_COMMUNITY): Payer: BC Managed Care – PPO

## 2019-06-11 ENCOUNTER — Other Ambulatory Visit: Payer: Self-pay | Admitting: *Deleted

## 2019-06-11 ENCOUNTER — Other Ambulatory Visit: Payer: Self-pay

## 2019-06-11 ENCOUNTER — Encounter (HOSPITAL_COMMUNITY)
Admission: AD | Disposition: A | Discharge status: Home / Self Care | Payer: Self-pay | Source: Ambulatory Visit | Attending: Cardiothoracic Surgery

## 2019-06-11 ENCOUNTER — Inpatient Hospital Stay (HOSPITAL_COMMUNITY)
Admission: AD | Admit: 2019-06-11 | Discharge: 2019-06-20 | DRG: 234 | Disposition: A | Discharge status: Home / Self Care | Payer: BC Managed Care – PPO | Source: Ambulatory Visit | Attending: Cardiothoracic Surgery | Admitting: Cardiothoracic Surgery

## 2019-06-11 DIAGNOSIS — Z6830 Body mass index (BMI) 30.0-30.9, adult: Secondary | ICD-10-CM

## 2019-06-11 DIAGNOSIS — E669 Obesity, unspecified: Secondary | ICD-10-CM | POA: Diagnosis present

## 2019-06-11 DIAGNOSIS — Z01810 Encounter for preprocedural cardiovascular examination: Secondary | ICD-10-CM | POA: Diagnosis not present

## 2019-06-11 DIAGNOSIS — E1165 Type 2 diabetes mellitus with hyperglycemia: Secondary | ICD-10-CM | POA: Diagnosis present

## 2019-06-11 DIAGNOSIS — Z881 Allergy status to other antibiotic agents status: Secondary | ICD-10-CM | POA: Diagnosis not present

## 2019-06-11 DIAGNOSIS — I10 Essential (primary) hypertension: Secondary | ICD-10-CM | POA: Diagnosis present

## 2019-06-11 DIAGNOSIS — Z79899 Other long term (current) drug therapy: Secondary | ICD-10-CM | POA: Diagnosis not present

## 2019-06-11 DIAGNOSIS — Z981 Arthrodesis status: Secondary | ICD-10-CM | POA: Diagnosis not present

## 2019-06-11 DIAGNOSIS — F324 Major depressive disorder, single episode, in partial remission: Secondary | ICD-10-CM | POA: Diagnosis present

## 2019-06-11 DIAGNOSIS — E785 Hyperlipidemia, unspecified: Secondary | ICD-10-CM | POA: Diagnosis present

## 2019-06-11 DIAGNOSIS — I2510 Atherosclerotic heart disease of native coronary artery without angina pectoris: Secondary | ICD-10-CM

## 2019-06-11 DIAGNOSIS — R110 Nausea: Secondary | ICD-10-CM | POA: Diagnosis not present

## 2019-06-11 DIAGNOSIS — Z794 Long term (current) use of insulin: Secondary | ICD-10-CM

## 2019-06-11 DIAGNOSIS — I200 Unstable angina: Secondary | ICD-10-CM | POA: Diagnosis present

## 2019-06-11 DIAGNOSIS — Z833 Family history of diabetes mellitus: Secondary | ICD-10-CM

## 2019-06-11 DIAGNOSIS — F5101 Primary insomnia: Secondary | ICD-10-CM | POA: Diagnosis present

## 2019-06-11 DIAGNOSIS — Z951 Presence of aortocoronary bypass graft: Secondary | ICD-10-CM

## 2019-06-11 DIAGNOSIS — Z8249 Family history of ischemic heart disease and other diseases of the circulatory system: Secondary | ICD-10-CM

## 2019-06-11 DIAGNOSIS — E119 Type 2 diabetes mellitus without complications: Secondary | ICD-10-CM | POA: Diagnosis not present

## 2019-06-11 DIAGNOSIS — IMO0002 Reserved for concepts with insufficient information to code with codable children: Secondary | ICD-10-CM

## 2019-06-11 DIAGNOSIS — Z90710 Acquired absence of both cervix and uterus: Secondary | ICD-10-CM

## 2019-06-11 DIAGNOSIS — Z20822 Contact with and (suspected) exposure to covid-19: Secondary | ICD-10-CM | POA: Diagnosis present

## 2019-06-11 DIAGNOSIS — Z91048 Other nonmedicinal substance allergy status: Secondary | ICD-10-CM

## 2019-06-11 DIAGNOSIS — G2581 Restless legs syndrome: Secondary | ICD-10-CM | POA: Diagnosis present

## 2019-06-11 DIAGNOSIS — I25110 Atherosclerotic heart disease of native coronary artery with unstable angina pectoris: Secondary | ICD-10-CM | POA: Diagnosis not present

## 2019-06-11 DIAGNOSIS — F329 Major depressive disorder, single episode, unspecified: Secondary | ICD-10-CM | POA: Diagnosis present

## 2019-06-11 DIAGNOSIS — I2582 Chronic total occlusion of coronary artery: Secondary | ICD-10-CM | POA: Diagnosis present

## 2019-06-11 DIAGNOSIS — K219 Gastro-esophageal reflux disease without esophagitis: Secondary | ICD-10-CM | POA: Diagnosis present

## 2019-06-11 DIAGNOSIS — Z79890 Hormone replacement therapy (postmenopausal): Secondary | ICD-10-CM | POA: Diagnosis not present

## 2019-06-11 DIAGNOSIS — F411 Generalized anxiety disorder: Secondary | ICD-10-CM | POA: Diagnosis present

## 2019-06-11 DIAGNOSIS — I361 Nonrheumatic tricuspid (valve) insufficiency: Secondary | ICD-10-CM

## 2019-06-11 DIAGNOSIS — I340 Nonrheumatic mitral (valve) insufficiency: Secondary | ICD-10-CM

## 2019-06-11 DIAGNOSIS — E8770 Fluid overload, unspecified: Secondary | ICD-10-CM | POA: Diagnosis not present

## 2019-06-11 DIAGNOSIS — Z888 Allergy status to other drugs, medicaments and biological substances status: Secondary | ICD-10-CM

## 2019-06-11 DIAGNOSIS — I252 Old myocardial infarction: Secondary | ICD-10-CM | POA: Diagnosis not present

## 2019-06-11 DIAGNOSIS — Z09 Encounter for follow-up examination after completed treatment for conditions other than malignant neoplasm: Secondary | ICD-10-CM

## 2019-06-11 DIAGNOSIS — K589 Irritable bowel syndrome without diarrhea: Secondary | ICD-10-CM | POA: Diagnosis present

## 2019-06-11 DIAGNOSIS — E1140 Type 2 diabetes mellitus with diabetic neuropathy, unspecified: Secondary | ICD-10-CM | POA: Diagnosis present

## 2019-06-11 DIAGNOSIS — R0602 Shortness of breath: Secondary | ICD-10-CM

## 2019-06-11 HISTORY — DX: Unstable angina: I20.0

## 2019-06-11 HISTORY — PX: LEFT HEART CATH AND CORONARY ANGIOGRAPHY: CATH118249

## 2019-06-11 LAB — PULMONARY FUNCTION TEST
FEF 25-75 Pre: 1.85 L/sec
FEF2575-%Pred-Pre: 86 %
FEV1-%Pred-Pre: 79 %
FEV1-Pre: 1.74 L
FEV1FVC-%Pred-Pre: 103 %
FEV6-%Pred-Pre: 78 %
FEV6-Pre: 2.14 L
FEV6FVC-%Pred-Pre: 103 %
FVC-%Pred-Pre: 75 %
FVC-Pre: 2.15 L
Pre FEV1/FVC ratio: 81 %
Pre FEV6/FVC Ratio: 99 %

## 2019-06-11 LAB — GLUCOSE, CAPILLARY
Glucose-Capillary: 173 mg/dL — ABNORMAL HIGH (ref 70–99)
Glucose-Capillary: 117 mg/dL — ABNORMAL HIGH (ref 70–99)
Glucose-Capillary: 111 mg/dL — ABNORMAL HIGH (ref 70–99)
Glucose-Capillary: 154 mg/dL — ABNORMAL HIGH (ref 70–99)

## 2019-06-11 LAB — ECHOCARDIOGRAM COMPLETE
Height: 60 in
Weight: 2512 oz

## 2019-06-11 LAB — HEPARIN LEVEL (UNFRACTIONATED): Heparin Unfractionated: 0.16 IU/mL — ABNORMAL LOW (ref 0.30–0.70)

## 2019-06-11 SURGERY — LEFT HEART CATH AND CORONARY ANGIOGRAPHY
Anesthesia: LOCAL

## 2019-06-11 MED ORDER — IOHEXOL 350 MG/ML SOLN
INTRAVENOUS | Status: DC | PRN
  Administered 2019-06-11: 70 mL

## 2019-06-11 MED ORDER — ASPIRIN EC 81 MG PO TBEC
81.0000 mg | DELAYED_RELEASE_TABLET | Freq: Every day | ORAL | Status: DC
  Administered 2019-06-12 – 2019-06-13 (×2): 81 mg via ORAL
  Filled 2019-06-11 (×2): qty 1

## 2019-06-11 MED ORDER — MIDAZOLAM HCL 2 MG/2ML IJ SOLN
INTRAMUSCULAR | Status: DC | PRN
  Administered 2019-06-11: 1 mg via INTRAVENOUS

## 2019-06-11 MED ORDER — VERAPAMIL HCL 2.5 MG/ML IV SOLN
INTRAVENOUS | Status: DC | PRN
  Administered 2019-06-11: 10 mL via INTRA_ARTERIAL

## 2019-06-11 MED ORDER — SODIUM CHLORIDE 0.9% FLUSH: 3.0000 mL | INTRAVENOUS | Status: DC | PRN

## 2019-06-11 MED ORDER — NOREPINEPHRINE 4 MG/250ML-% IV SOLN
0.0000 ug/min | INTRAVENOUS | Status: DC
  Filled 2019-06-11: qty 250

## 2019-06-11 MED ORDER — NITROGLYCERIN IN D5W 200-5 MCG/ML-% IV SOLN
2.0000 ug/min | INTRAVENOUS | Status: AC
  Administered 2019-06-14: 16.6 ug/min via INTRAVENOUS
  Filled 2019-06-11: qty 250

## 2019-06-11 MED ORDER — LOTEPREDNOL ETABONATE 0.5 % OP SUSP
1.0000 [drp] | Freq: Four times a day (QID) | OPHTHALMIC | Status: DC | PRN
  Filled 2019-06-11: qty 5

## 2019-06-11 MED ORDER — TRANEXAMIC ACID (OHS) PUMP PRIME SOLUTION
2.0000 mg/kg | INTRAVENOUS | Status: DC
  Filled 2019-06-11: qty 1.44

## 2019-06-11 MED ORDER — ACETAMINOPHEN 325 MG PO TABS: 650.0000 mg | ORAL_TABLET | ORAL | Status: DC | PRN

## 2019-06-11 MED ORDER — VITAMIN B-12 1000 MCG PO TABS
2500.0000 ug | ORAL_TABLET | Freq: Every day | ORAL | Status: DC
  Administered 2019-06-12 – 2019-06-13 (×2): 2500 ug via ORAL
  Filled 2019-06-11 (×2): qty 3

## 2019-06-11 MED ORDER — LIDOCAINE HCL (PF) 1 % IJ SOLN
INTRAMUSCULAR | Status: AC
  Filled 2019-06-11: qty 30

## 2019-06-11 MED ORDER — GABAPENTIN 400 MG PO CAPS
1600.0000 mg | ORAL_CAPSULE | Freq: Every day | ORAL | Status: DC
  Administered 2019-06-11 – 2019-06-13 (×3): 1600 mg via ORAL
  Filled 2019-06-11 (×3): qty 4

## 2019-06-11 MED ORDER — VERAPAMIL HCL 2.5 MG/ML IV SOLN
INTRAVENOUS | Status: AC
  Filled 2019-06-11: qty 2

## 2019-06-11 MED ORDER — GABAPENTIN 400 MG PO CAPS
800.0000 mg | ORAL_CAPSULE | Freq: Two times a day (BID) | ORAL | Status: DC
  Administered 2019-06-12 – 2019-06-13 (×4): 800 mg via ORAL
  Filled 2019-06-11 (×4): qty 2

## 2019-06-11 MED ORDER — LEVOFLOXACIN IN D5W 500 MG/100ML IV SOLN
500.0000 mg | INTRAVENOUS | Status: AC
  Administered 2019-06-14: 500 mg via INTRAVENOUS
  Filled 2019-06-11: qty 100

## 2019-06-11 MED ORDER — HEPARIN SODIUM (PORCINE) 1000 UNIT/ML IJ SOLN
INTRAMUSCULAR | Status: DC | PRN
  Administered 2019-06-11: 3500 [IU] via INTRAVENOUS

## 2019-06-11 MED ORDER — METOPROLOL TARTRATE 25 MG PO TABS
25.0000 mg | ORAL_TABLET | Freq: Two times a day (BID) | ORAL | Status: DC
  Administered 2019-06-11 – 2019-06-13 (×5): 25 mg via ORAL
  Filled 2019-06-11 (×5): qty 2

## 2019-06-11 MED ORDER — INSULIN REGULAR(HUMAN) IN NACL 100-0.9 UT/100ML-% IV SOLN
INTRAVENOUS | Status: AC
  Administered 2019-06-14: 8.5 [IU]/h via INTRAVENOUS
  Filled 2019-06-11: qty 100

## 2019-06-11 MED ORDER — MIDAZOLAM HCL 2 MG/2ML IJ SOLN
INTRAMUSCULAR | Status: AC
  Filled 2019-06-11: qty 2

## 2019-06-11 MED ORDER — SODIUM CHLORIDE 0.9 % IV SOLN: INTRAVENOUS | Status: AC

## 2019-06-11 MED ORDER — CYCLOBENZAPRINE HCL 10 MG PO TABS
10.0000 mg | ORAL_TABLET | Freq: Every day | ORAL | Status: DC
  Administered 2019-06-11 – 2019-06-19 (×9): 10 mg via ORAL
  Filled 2019-06-11 (×10): qty 1

## 2019-06-11 MED ORDER — VANCOMYCIN HCL 1250 MG/250ML IV SOLN
1250.0000 mg | INTRAVENOUS | Status: AC
  Administered 2019-06-14: 1250 mg via INTRAVENOUS
  Filled 2019-06-11: qty 250

## 2019-06-11 MED ORDER — NITROGLYCERIN 1 MG/10 ML FOR IR/CATH LAB
INTRA_ARTERIAL | Status: AC
  Filled 2019-06-11: qty 10

## 2019-06-11 MED ORDER — DULOXETINE HCL 60 MG PO CPEP
60.0000 mg | ORAL_CAPSULE | Freq: Every day | ORAL | Status: DC
  Administered 2019-06-12 – 2019-06-20 (×8): 60 mg via ORAL
  Filled 2019-06-11 (×8): qty 1

## 2019-06-11 MED ORDER — EPINEPHRINE HCL 5 MG/250ML IV SOLN IN NS
0.0000 ug/min | INTRAVENOUS | Status: DC
  Filled 2019-06-11: qty 250

## 2019-06-11 MED ORDER — CANAGLIFLOZIN 300 MG PO TABS
300.0000 mg | ORAL_TABLET | Freq: Every day | ORAL | Status: DC
  Administered 2019-06-11: 300 mg via ORAL
  Filled 2019-06-11 (×2): qty 1

## 2019-06-11 MED ORDER — SODIUM CHLORIDE 0.9 % WEIGHT BASED INFUSION
3.0000 mL/kg/h | INTRAVENOUS | Status: DC
  Administered 2019-06-11: 3 mL/kg/h via INTRAVENOUS

## 2019-06-11 MED ORDER — PANTOPRAZOLE SODIUM 40 MG PO TBEC
40.0000 mg | DELAYED_RELEASE_TABLET | Freq: Every day | ORAL | Status: DC
  Administered 2019-06-12 – 2019-06-13 (×2): 40 mg via ORAL
  Filled 2019-06-11 (×2): qty 1

## 2019-06-11 MED ORDER — LIDOCAINE HCL (PF) 1 % IJ SOLN
INTRAMUSCULAR | Status: DC | PRN
  Administered 2019-06-11: 2 mL via INTRADERMAL

## 2019-06-11 MED ORDER — MIRTAZAPINE 15 MG PO TABS
45.0000 mg | ORAL_TABLET | Freq: Every day | ORAL | Status: DC
  Administered 2019-06-11 – 2019-06-13 (×3): 45 mg via ORAL
  Filled 2019-06-11 (×2): qty 3
  Filled 2019-06-11 (×3): qty 1
  Filled 2019-06-11: qty 3

## 2019-06-11 MED ORDER — ATORVASTATIN CALCIUM 40 MG PO TABS
40.0000 mg | ORAL_TABLET | Freq: Every day | ORAL | Status: DC
  Administered 2019-06-11 – 2019-06-19 (×9): 40 mg via ORAL
  Filled 2019-06-11 (×9): qty 1

## 2019-06-11 MED ORDER — FAMOTIDINE 20 MG PO TABS
20.0000 mg | ORAL_TABLET | Freq: Every day | ORAL | Status: DC | PRN
  Administered 2019-06-19: 20 mg via ORAL
  Filled 2019-06-11: qty 1

## 2019-06-11 MED ORDER — SODIUM CHLORIDE 0.9 % IV SOLN
INTRAVENOUS | Status: DC
  Filled 2019-06-11: qty 30

## 2019-06-11 MED ORDER — HEPARIN (PORCINE) IN NACL 1000-0.9 UT/500ML-% IV SOLN
INTRAVENOUS | Status: DC | PRN
  Administered 2019-06-11 (×2): 500 mL

## 2019-06-11 MED ORDER — MILRINONE LACTATE IN DEXTROSE 20-5 MG/100ML-% IV SOLN
0.3000 ug/kg/min | INTRAVENOUS | Status: DC
  Filled 2019-06-11: qty 100

## 2019-06-11 MED ORDER — LISINOPRIL 10 MG PO TABS: 20.0000 mg | ORAL_TABLET | Freq: Every day | ORAL | Status: DC

## 2019-06-11 MED ORDER — DULAGLUTIDE 1.5 MG/0.5ML ~~LOC~~ SOAJ: 1.5000 mg | SUBCUTANEOUS | Status: DC

## 2019-06-11 MED ORDER — NITROGLYCERIN 0.4 MG SL SUBL: 0.4000 mg | SUBLINGUAL_TABLET | SUBLINGUAL | Status: DC | PRN

## 2019-06-11 MED ORDER — HEPARIN SODIUM (PORCINE) 1000 UNIT/ML IJ SOLN
INTRAMUSCULAR | Status: AC
  Filled 2019-06-11: qty 1

## 2019-06-11 MED ORDER — PHENYLEPHRINE HCL-NACL 20-0.9 MG/250ML-% IV SOLN
30.0000 ug/min | INTRAVENOUS | Status: AC
  Administered 2019-06-14: 20 ug/min via INTRAVENOUS
  Filled 2019-06-11: qty 250

## 2019-06-11 MED ORDER — GABAPENTIN 800 MG PO TABS
800.0000 mg | ORAL_TABLET | Freq: Two times a day (BID) | ORAL | Status: DC
  Filled 2019-06-11: qty 1

## 2019-06-11 MED ORDER — SODIUM CHLORIDE 0.9 % WEIGHT BASED INFUSION: 1.0000 mL/kg/h | INTRAVENOUS | Status: DC

## 2019-06-11 MED ORDER — MAGNESIUM OXIDE 400 (241.3 MG) MG PO TABS
800.0000 mg | ORAL_TABLET | Freq: Every day | ORAL | Status: DC
  Administered 2019-06-11 – 2019-06-13 (×3): 800 mg via ORAL
  Filled 2019-06-11 (×4): qty 2

## 2019-06-11 MED ORDER — PERFLUTREN LIPID MICROSPHERE
1.0000 mL | INTRAVENOUS | Status: AC | PRN
  Administered 2019-06-11: 2 mL via INTRAVENOUS
  Filled 2019-06-11: qty 10

## 2019-06-11 MED ORDER — ONDANSETRON HCL 4 MG/2ML IJ SOLN: 4.0000 mg | Freq: Four times a day (QID) | INTRAMUSCULAR | Status: DC | PRN

## 2019-06-11 MED ORDER — FENTANYL CITRATE (PF) 100 MCG/2ML IJ SOLN
INTRAMUSCULAR | Status: AC
  Filled 2019-06-11: qty 2

## 2019-06-11 MED ORDER — DEXMEDETOMIDINE HCL IN NACL 400 MCG/100ML IV SOLN
0.1000 ug/kg/h | INTRAVENOUS | Status: AC
  Administered 2019-06-14: .5 ug/kg/h via INTRAVENOUS
  Filled 2019-06-11: qty 100

## 2019-06-11 MED ORDER — SODIUM CHLORIDE 0.9% FLUSH
3.0000 mL | Freq: Two times a day (BID) | INTRAVENOUS | Status: DC
  Administered 2019-06-11 – 2019-06-13 (×6): 3 mL via INTRAVENOUS

## 2019-06-11 MED ORDER — PLASMA-LYTE 148 IV SOLN
INTRAVENOUS | Status: DC
  Filled 2019-06-11: qty 2.5

## 2019-06-11 MED ORDER — ESTRADIOL 1 MG PO TABS
1.0000 mg | ORAL_TABLET | Freq: Every day | ORAL | Status: DC
  Administered 2019-06-11 – 2019-06-19 (×9): 1 mg via ORAL
  Filled 2019-06-11 (×9): qty 1

## 2019-06-11 MED ORDER — HEPARIN (PORCINE) 25000 UT/250ML-% IV SOLN
1100.0000 [IU]/h | INTRAVENOUS | Status: DC
  Administered 2019-06-11: 900 [IU]/h via INTRAVENOUS
  Administered 2019-06-12 – 2019-06-13 (×2): 1100 [IU]/h via INTRAVENOUS
  Filled 2019-06-11 (×3): qty 250

## 2019-06-11 MED ORDER — ASPIRIN 81 MG PO CHEW: 81.0000 mg | CHEWABLE_TABLET | ORAL | Status: DC

## 2019-06-11 MED ORDER — ROPINIROLE HCL 1 MG PO TABS
1.5000 mg | ORAL_TABLET | Freq: Every day | ORAL | Status: DC
  Administered 2019-06-11 – 2019-06-13 (×3): 1.5 mg via ORAL
  Filled 2019-06-11 (×4): qty 1

## 2019-06-11 MED ORDER — TRANEXAMIC ACID (OHS) BOLUS VIA INFUSION
15.0000 mg/kg | INTRAVENOUS | Status: AC
  Administered 2019-06-14: 1081.5 mg via INTRAVENOUS
  Filled 2019-06-11: qty 1082

## 2019-06-11 MED ORDER — POTASSIUM CHLORIDE 2 MEQ/ML IV SOLN
80.0000 meq | INTRAVENOUS | Status: DC
  Filled 2019-06-11: qty 40

## 2019-06-11 MED ORDER — TRIAMTERENE-HCTZ 37.5-25 MG PO TABS
1.0000 | ORAL_TABLET | Freq: Every day | ORAL | Status: DC
  Administered 2019-06-11 – 2019-06-13 (×3): 1 via ORAL
  Filled 2019-06-11 (×4): qty 1

## 2019-06-11 MED ORDER — FENTANYL CITRATE (PF) 100 MCG/2ML IJ SOLN
INTRAMUSCULAR | Status: DC | PRN
  Administered 2019-06-11: 50 ug via INTRAVENOUS

## 2019-06-11 MED ORDER — HEPARIN (PORCINE) IN NACL 1000-0.9 UT/500ML-% IV SOLN
INTRAVENOUS | Status: AC
  Filled 2019-06-11: qty 1000

## 2019-06-11 MED ORDER — SODIUM CHLORIDE 0.9% FLUSH
3.0000 mL | Freq: Two times a day (BID) | INTRAVENOUS | Status: DC
  Administered 2019-06-12: 3 mL via INTRAVENOUS

## 2019-06-11 MED ORDER — SODIUM CHLORIDE 0.9 % IV SOLN: 250.0000 mL | INTRAVENOUS | Status: DC | PRN

## 2019-06-11 MED ORDER — VITAMIN B-12 2500 MCG SL SUBL: 2500.0000 ug | SUBLINGUAL_TABLET | Freq: Every day | SUBLINGUAL | Status: DC

## 2019-06-11 MED ORDER — TRANEXAMIC ACID 1000 MG/10ML IV SOLN
1.5000 mg/kg/h | INTRAVENOUS | Status: AC
  Administered 2019-06-14: 1.5 mg/kg/h via INTRAVENOUS
  Filled 2019-06-11: qty 25

## 2019-06-11 MED ORDER — MAGNESIUM SULFATE 50 % IJ SOLN
40.0000 meq | INTRAMUSCULAR | Status: DC
  Filled 2019-06-11: qty 9.85

## 2019-06-11 SURGICAL SUPPLY — 13 items
CATH 5FR JL3.5 JR4 ANG PIG MP (CATHETERS) ×2 IMPLANT
CATH INFINITI 5FR JK (CATHETERS) ×2 IMPLANT
DEVICE RAD COMP TR BAND LRG (VASCULAR PRODUCTS) ×2 IMPLANT
GLIDESHEATH SLEND SS 6F .021 (SHEATH) ×2 IMPLANT
GUIDEWIRE INQWIRE 1.5J.035X260 (WIRE) ×1 IMPLANT
INQWIRE 1.5J .035X260CM (WIRE) ×2
KIT HEART LEFT (KITS) ×2 IMPLANT
PACK CARDIAC CATHETERIZATION (CUSTOM PROCEDURE TRAY) ×2 IMPLANT
SHEATH FAST CATH 14F (SHEATH) ×2 IMPLANT
SHEATH PROBE COVER 6X72 (BAG) ×2 IMPLANT
TRANSDUCER W/STOPCOCK (MISCELLANEOUS) ×2 IMPLANT
TUBING CIL FLEX 10 FLL-RA (TUBING) ×2 IMPLANT
WIRE HI TORQ VERSACORE-J 145CM (WIRE) ×2 IMPLANT

## 2019-06-11 NOTE — Progress Notes (Signed)
ANTICOAGULATION CONSULT NOTE - Initial Consult  Pharmacy Consult for heparin Indication: chest pain/ACS  Allergies  Allergen Reactions  . Adhesive [Tape] Hives    Welts--PLEASE USE HYPO ALLERGIC TAPE  . Ampicillin Diarrhea    Did it involve swelling of the face/tongue/throat, SOB, or low BP? No Did it involve sudden or severe rash/hives, skin peeling, or any reaction on the inside of your mouth or nose? No Did you need to seek medical attention at a hospital or doctor's office? No When did it last happen?30-40 years ago If all above answers are "NO", may proceed with cephalosporin use.   Marland Kitchen Cefprozil Other (See Comments)    Acid reflux/indigestion issues  . Meloxicam Other (See Comments)    Acid reflux/indigestion issues    Patient Measurements: Height: 5' (152.4 cm) Weight: 72.1 kg (159 lb) IBW/kg (Calculated) : 45.5     Vital Signs: Temp: 98.2 F (36.8 C) (05/21 1159) Temp Source: Oral (05/21 1159) BP: 118/105 (05/21 1159) Pulse Rate: 77 (05/21 1159)  Labs: No results for input(s): HGB, HCT, PLT, APTT, LABPROT, INR, HEPARINUNFRC, HEPRLOWMOCWT, CREATININE, CKTOTAL, CKMB, TROPONINIHS in the last 72 hours.  Estimated Creatinine Clearance: 66.2 mL/min (by C-G formula based on SCr of 0.63 mg/dL).   Medical History: Past Medical History:  Diagnosis Date  . Abnormal ECG 04/01/2018  . Abnormal stress echocardiogram 05/24/2019   Formatting of this note might be different from the original. Added automatically from request for surgery [id number]  . Anemia   . Anxiety   . Atherosclerosis of native coronary artery with angina pectoris ([place]) 05/10/2015   Formatting of this note might be different from the original. 30% lesion-Chiu  . Bilateral carpal tunnel syndrome 05/23/2017  . Bilateral knee pain 02/16/2010   Qualifier: Diagnosis of  By: Koleen Nimrod MD, Dellis Filbert    . Carpal tunnel syndrome, right 12/16/2017  . Colon polyps   . Depression   . DEPRESSION 02/16/2010   Qualifier:  Diagnosis of  By: Koleen Nimrod MD, Dellis Filbert    . Depression, major, in partial remission ([place]) 07/14/2015  . Diabetes mellitus   . Diverticulosis   . Dupuytren's contracture of right hand 06/24/2017  . Essential hypertension 02/16/2010   Qualifier: Diagnosis of  By: Koleen Nimrod MD, Dellis Filbert    . Gastro-esophageal reflux   . Gastrointestinal ulcer   . Generalized anxiety disorder 03/14/2015  . GERD 02/16/2010   Qualifier: Diagnosis of  By: Koleen Nimrod MD, Dellis Filbert    . H/O: GI bleed    Michela Pitcher it was due to taking metformin  . Hyperlipidemia   . Hyperlipidemia LDL goal <70 02/16/2010   Qualifier: Diagnosis of  By: Koleen Nimrod MD, Dellis Filbert    . Hypertension   . IBS (irritable bowel syndrome)   . Impingement syndrome of shoulder, right 06/24/2017  . Midline low back pain with left-sided sciatica 12/29/2013  . Musculoskeletal chest pain 05/04/2015  . Obesity   . Precordial pain 05/04/2015  . Primary insomnia 03/14/2015  . Radiculitis of right cervical region 01/20/2018  . Radiculopathy 04/02/2018  . Right hand pain 06/24/2017  . RLS (restless legs syndrome) 07/14/2015  . Type 2 diabetes mellitus with diabetic neuropathy, with long-term current use of insulin ([place]) 02/16/2010   Qualifier: Diagnosis of  By: Koleen Nimrod MD, Dellis Filbert       Assessment: [phone] admitted with CP > cath lab found to have 3v CAD plan for CABG Begin heparin drip 8hr post sheath out (oout 830 TR band placed)  Goal of Therapy:  Heparin level 0.3-0.7  units/ml Monitor platelets by anticoagulation protocol: Yes   Plan:  Begin heparin drip 900 uts/hr at 1630 tonight Draw HL 6h after start Daily HL, CBC Monitor s/s bleeding   Bonnita Nasuti Pharm.D. CPP, BCPS Clinical Pharmacist [phone] 06/11/2019 2:29 PM

## 2019-06-11 NOTE — Interval H&P Note (Signed)
Cath Lab Visit (complete for each Cath Lab visit)  Clinical Evaluation Leading to the Procedure:   ACS: No.  Non-ACS:    Anginal Classification: CCS III  Anti-ischemic medical therapy: Minimal Therapy (1 class of medications)  Non-Invasive Test Results: Intermediate-risk stress test findings: cardiac mortality 1-3%/year  Prior CABG: No previous CABG      History and Physical Interval Note:  06/11/2019 8:06 AM  Tonya Chandler  has presented today for surgery, with the diagnosis of abnormal stress test.  The various methods of treatment have been discussed with the patient and family. After consideration of risks, benefits and other options for treatment, the patient has consented to  Procedure(s): LEFT HEART CATH AND CORONARY ANGIOGRAPHY (N/A) as a surgical intervention.  [name] history has been reviewed, patient examined, no change in status, stable for surgery.  I have reviewed the patient's chart and labs.  Questions were answered to the patient's satisfaction.     Kathlyn Sacramento

## 2019-06-11 NOTE — Progress Notes (Signed)
TCTS consulted for CABG evaluation. °

## 2019-06-11 NOTE — Progress Notes (Signed)
[facility] for heparin Indication: chest pain/ACS  Allergies  Allergen Reactions  . Adhesive [Tape] Hives    Welts--PLEASE USE HYPO ALLERGIC TAPE  . Ampicillin Diarrhea    Did it involve swelling of the face/tongue/throat, SOB, or low BP? No Did it involve sudden or severe rash/hives, skin peeling, or any reaction on the inside of your mouth or nose? No Did you need to seek medical attention at a hospital or doctor's office? No When did it last happen?30-40 years ago If all above answers are "NO", may proceed with cephalosporin use.   Marland Kitchen Cefprozil Other (See Comments)    Acid reflux/indigestion issues  . Meloxicam Other (See Comments)    Acid reflux/indigestion issues    Patient Measurements: Height: 5' (152.4 cm) Weight: 72.1 kg (159 lb) IBW/kg (Calculated) : 45.5     Vital Signs: Temp: 98.2 F (36.8 C) (05/21 2054) Temp Source: Oral (05/21 2054) BP: 148/80 (05/21 2054) Pulse Rate: 79 (05/21 2054)  Labs: Recent Labs    06/11/19 2307  HEPARINUNFRC 0.16*    Estimated Creatinine Clearance: 66.2 mL/min (by C-G formula based on SCr of 0.63 mg/dL).   Assessment: 59 y.o. female with CAD s/p cath and awaiting CABG, for heparin  Goal of Therapy:  Heparin level 0.3-0.7 units/ml Monitor platelets by anticoagulation protocol: Yes   Plan:  Increase Heparin 1100 units/hr Follow-up am labs.  Phillis Knack, PharmD, BCPS  06/11/2019 11:40 PM

## 2019-06-11 NOTE — Progress Notes (Signed)
Discussed sternal precautions, IS (1300 mL), mobility post op and d/c planning with pt. She was very receptive and was able to get in and out of bed following "move in the tube" guidelines. Her husband will be with her at d/c. She declined ambulation in hall at this time as she is resting but plans to walk with her family later today. Encouraged mobility this weekend but to notify staff of any sx. Gave OHS booklet, careguide, and preop video to review. [place], ACSM 2:14 PM 06/11/2019

## 2019-06-11 NOTE — Consult Note (Addendum)
[address]       [place] [address]             [phone]        Larya Minotti [employer] Medical Record [id number] Date of Birth: December 12, 1960  Referring: No ref. provider found Primary Care: Emeterio Reeve, DO Primary Cardiologist:No primary care provider on file.  Chief Complaint:   Shortness of breath, chest pain   History of Present Illness:       [name] is a 59 year old female patient with a past medical history significant for diabetes mellitus type 2 since age 34 , dyslipidemia, hypertension, Depression/Anxiety, Obesity,  history of GI bleed, and known coronary artery disease that presents outpatient to cardiology with a atypical chest pain that happens at rest and also can happen with exercise.  She describes the pain as a pressure and heavy sensation that lasts for a few minutes and also radiates to the neck as well as the shoulder.  She had a cardiac catheterization done about 10 years ago which showed mild disease.  She had a stress test done by Dr. Hamilton Capri which showed significant abnormalities after only walking 5 minutes on the treadmill.  She became short of breath and had significant ST segment depression.  Was recommended that she undergo cardiac catheterization for further evaluation.  This morning she had a left heart catheterization which showed estimated left ventricular ejection fraction of 55 to 65%, a proximal RCA lesion of 99%, ostial left main lesion of 40%, distal circumflex lesion of 100%, proximal LAD lesion of 95% stenosis and mid LAD lesion of 40% stenosis.  Due to her three-vessel coronary artery disease and diabetic status CABG was recommended.  An echocardiogram has been ordered but is not yet done.  The patient shares that she was working in [employer] and traveling only a few days ago and felt fine. Her CAD was a shock, however her brother recently passed away from ischemic bowel/ sepsis s/p redo CABG. He was 61 years old at the  time of death. His first CABG was done when he was 61 years old. We discussed her CAD being related to her uncontrolled diabetes and strong family history.    Current Activity/ Functional Status: Patient was independent with mobility/ambulation, transfers, ADL's, IADL's.   Zubrod Score: At the time of surgery this patient's most appropriate activity status/level should be described as: []     0    Normal activity, no symptoms [x]     1    Restricted in physical strenuous activity but ambulatory, able to do out light work []     2    Ambulatory and capable of self care, unable to do work activities, up and about                 more than 50%  Of the time                            []     3    Only limited self care, in bed greater than 50% of waking hours []     4    Completely disabled, no self care, confined to bed or chair []     5    Moribund  Past Medical History:  Diagnosis Date  . Abnormal ECG 04/01/2018  . Abnormal stress echocardiogram 05/24/2019   Formatting of this note might be different from the original. Added automatically  from request for surgery [phone]  . Anemia   . Anxiety   . Atherosclerosis of native coronary artery with angina pectoris ([place]) 05/10/2015   Formatting of this note might be different from the original. 30% lesion-Chiu  . Bilateral carpal tunnel syndrome 05/23/2017  . Bilateral knee pain 02/16/2010   Qualifier: Diagnosis of  By: Koleen Nimrod MD, Dellis Filbert    . Carpal tunnel syndrome, right 12/16/2017  . Colon polyps   . Depression   . DEPRESSION 02/16/2010   Qualifier: Diagnosis of  By: Koleen Nimrod MD, Dellis Filbert    . Depression, major, in partial remission ([place]) 07/14/2015  . Diabetes mellitus   . Diverticulosis   . Dupuytren's contracture of right hand 06/24/2017  . Essential hypertension 02/16/2010   Qualifier: Diagnosis of  By: Koleen Nimrod MD, Dellis Filbert    . Gastro-esophageal reflux   . Gastrointestinal ulcer   . Generalized anxiety disorder 03/14/2015  . GERD 02/16/2010    Qualifier: Diagnosis of  By: Koleen Nimrod MD, Dellis Filbert    . H/O: GI bleed    Michela Pitcher it was due to taking metformin  . Hyperlipidemia   . Hyperlipidemia LDL goal <70 02/16/2010   Qualifier: Diagnosis of  By: Koleen Nimrod MD, Dellis Filbert    . Hypertension   . IBS (irritable bowel syndrome)   . Impingement syndrome of shoulder, right 06/24/2017  . Midline low back pain with left-sided sciatica 12/29/2013  . Musculoskeletal chest pain 05/04/2015  . Obesity   . Precordial pain 05/04/2015  . Primary insomnia 03/14/2015  . Radiculitis of right cervical region 01/20/2018  . Radiculopathy 04/02/2018  . Right hand pain 06/24/2017  . RLS (restless legs syndrome) 07/14/2015  . Type 2 diabetes mellitus with diabetic neuropathy, with long-term current use of insulin ([place]) 02/16/2010   Qualifier: Diagnosis of  By: Koleen Nimrod MD, Dellis Filbert      Past Surgical History:  Procedure Laterality Date  . ABDOMINAL HYSTERECTOMY    . ANTERIOR CERVICAL DECOMP/DISCECTOMY FUSION N/A 04/02/2018   Procedure: ANTERIOR CERVICAL DECOMPRESSION FUSION, CERVICAL FOUR-FIVE, CERVICAL FIVE-SIX, WITH INSTRUMENTATION AND ALLOGRAFT;  Surgeon: Phylliss Bob, MD;  Location: [facility];  Service: Orthopedics;  Laterality: N/A;  . COLONOSCOPY WITH ESOPHAGOGASTRODUODENOSCOPY (EGD)  05/04/2009  . PIP JOINT FUSION Bilateral    pinky    Social History   Tobacco Use  Smoking Status Never Smoker  Smokeless Tobacco Never Used    Social History   Substance and Sexual Activity  Alcohol Use No     Allergies  Allergen Reactions  . Adhesive [Tape] Hives    Welts--PLEASE USE HYPO ALLERGIC TAPE  . Ampicillin Diarrhea    Did it involve swelling of the face/tongue/throat, SOB, or low BP? No Did it involve sudden or severe rash/hives, skin peeling, or any reaction on the inside of your mouth or nose? No Did you need to seek medical attention at a hospital or doctor's office? No When did it last happen? 30-40 years ago      If all above answers are "NO", may  proceed with cephalosporin use.   Marland Kitchen Cefprozil Other (See Comments)    Acid reflux/indigestion issues  . Meloxicam Other (See Comments)    Acid reflux/indigestion issues    Current Facility-Administered Medications  Medication Dose Route Frequency Provider Last Rate Last Admin  . 0.9 %  sodium chloride infusion  250 mL Intravenous PRN Park Liter, MD      . 0.9 %  sodium chloride infusion   Intravenous Continuous Wellington Hampshire, MD 75 mL/hr at  06/11/19 0859 75 mL/hr at 06/11/19 0859  . 0.9% sodium chloride infusion  1 mL/kg/hr Intravenous Continuous Park Liter, MD 71.7 mL/hr at 06/11/19 0731 1 mL/kg/hr at 06/11/19 0731  . aspirin chewable tablet 81 mg  81 mg Oral Pre-Cath Park Liter, MD      . ondansetron [facility]) injection 4 mg  4 mg Intravenous Q6H PRN Kathlyn Sacramento A, MD      . sodium chloride flush (NS) 0.9 % injection 3 mL  3 mL Intravenous Q12H Park Liter, MD      . sodium chloride flush (NS) 0.9 % injection 3 mL  3 mL Intravenous PRN Park Liter, MD        Medications Prior to Admission  Medication Sig Dispense Refill Last Dose  . aspirin EC 81 MG tablet Take 1 tablet (81 mg total) by mouth daily. 90 tablet 3 06/11/2019 at 0415  . atorvastatin (LIPITOR) 40 MG tablet Take 1 tablet (40 mg total) by mouth daily. 90 tablet 3 06/10/2019 at pm  . Cyanocobalamin (VITAMIN B-12) 2500 MCG SUBL Place 2,500 mcg under the tongue daily.   06/11/2019 at am  . cyclobenzaprine (FLEXERIL) 10 MG tablet Take 1 tablet (10 mg total) by mouth at bedtime. 30 tablet 3 06/10/2019 at pm  . Dulaglutide (TRULICITY) 1.5 0000000 SOPN Inject 1.5 mg into the skin every Sunday. 4 pen 11 Past Week at Unknown time  . DULoxetine (CYMBALTA) 60 MG capsule Take 1 capsule (60 mg total) by mouth daily. 90 capsule 3 06/11/2019 at am  . esomeprazole (NEXIUM) 20 MG capsule Take 40 mg by mouth daily at 12 noon.    06/11/2019 at am  . estradiol (ESTRACE) 1 MG tablet Take 1 mg by mouth  at bedtime.    06/10/2019 at pm  . Famotidine 20 MG CHEW Chew 20 mg by mouth daily as needed (reflux).    prn  . gabapentin (NEURONTIN) 800 MG tablet 1 tab in the morning, 1 tab midday, 2 tabs at bedtime (Patient taking differently: Take 800 mg by mouth See admin instructions. 800 mg in the morning, 800 mg midday, 1600 mg at bedtime) 120 tablet 3 06/10/2019 at Unknown time  . Insulin Glargine (BASAGLAR KWIKPEN) 100 UNIT/ML Inject 0.54 mLs (54 Units total) into the skin daily. (Patient taking differently: Inject 64 Units into the skin at bedtime. ) 5 pen 1 06/10/2019 at Unknown time  . insulin lispro (HUMALOG KWIKPEN) 200 UNIT/ML KwikPen Inject 10-15 units as directed prior to lunch and dinner (Patient taking differently: Inject 10-15 Units into the skin in the morning and at bedtime. Inject 10-15 units as directed prior to lunch and dinner) 2 pen 5 06/10/2019 at Unknown time  . INVOKANA 300 MG TABS tablet Take 1 tablet (300 mg total) by mouth daily. 90 tablet 1 06/10/2019 at Unknown time  . magnesium oxide (MAG-OX) 400 MG tablet Take 800 mg by mouth at bedtime.    06/10/2019 at pm  . mirtazapine (REMERON) 45 MG tablet Take 45 mg by mouth at bedtime.   5 06/10/2019 at pm  . nitroGLYCERIN (NITROSTAT) 0.4 MG SL tablet Place 1 tablet (0.4 mg total) under the tongue every 5 (five) minutes as needed for chest pain. 25 tablet 3 Past Week at Unknown time  . rOPINIRole (REQUIP) 0.5 MG tablet Take 3 tablets (1.5 mg total) by mouth at bedtime. 270 tablet 3 06/10/2019 at pm  . trandolapril-verapamil (TARKA) 2-240 MG tablet Take 1 tablet by mouth daily.  90 tablet 3 06/11/2019 at Unknown time  . triamterene-hydrochlorothiazide (MAXZIDE-25) 37.5-25 MG tablet Take 1 tablet by mouth daily.   06/10/2019 at Unknown time  . loteprednol (LOTEMAX) 0.5 % ophthalmic suspension Place 1 drop into the right eye 4 (four) times daily.   prn    Family History  Problem Relation Age of Onset  . Diabetes Mother   . Dementia Mother   . High  blood pressure Mother   . Heart attack Mother   . Colon cancer Mother   . Ovarian cancer Mother   . Heart attack Father   . Diabetes Father   . Heart attack Brother   . High blood pressure Brother   . Diabetes Brother   . Epilepsy Brother   . Colon polyps Daughter 50  . Esophageal cancer Neg Hx   . Rectal cancer Neg Hx   . Stomach cancer Neg Hx   . Heart disease Neg Hx      Review of Systems:   Review of Systems  Constitutional: Negative for chills, fever and malaise/fatigue.  Respiratory: Positive for shortness of breath.   Cardiovascular: Positive for chest pain. Negative for leg swelling.  Psychiatric/Behavioral: Positive for depression. The patient is nervous/anxious.    Pertinent items are noted in HPI.     Physical Exam: BP 128/72   Pulse 81   Temp (!) 97.5 F (36.4 C) (Skin)   Resp 19   Ht 5' (1.524 m)   Wt 71.2 kg   SpO2 (!) 89%   BMI 30.66 kg/m    General appearance: alert, cooperative and no distress Resp: clear to auscultation bilaterally Cardio: regular rate and rhythm, S1, S2 normal, no murmur, click, rub or gallop GI: soft, non-tender; bowel sounds normal; no masses,  no organomegaly Extremities: extremities normal, atraumatic, no cyanosis or edema. Lower extremity seemed appropriate for saphenous vein harvest Neurologic: Grossly normal Pathe in left handed, cath done from right radial  Diagnostic Studies & Laboratory data:     Recent Radiology Findings:   CARDIAC CATHETERIZATION  Result Date: 06/11/2019  The left ventricular systolic function is normal.  LV end diastolic pressure is mildly elevated.  The left ventricular ejection fraction is 55-65% by visual estimate.  Prox RCA lesion is 99% stenosed.  Ost LM lesion is 40% stenosed.  Dist Cx lesion is 100% stenosed.  Prox LAD lesion is 95% stenosed.  Mid LAD lesion is 40% stenosed.  1.  Severe three-vessel coronary artery disease including severe stenosis in the proximal LAD, occluded mid  to distal left circumflex with collaterals to the OM's, subtotal occlusion of the proximal right coronary artery with left-to-right collaterals. 2.  Normal LV systolic function mildly elevated left ventricular end-diastolic pressure. Recommendations: Given three-vessel coronary artery disease and diabetic status, recommend CABG. Due to severity of her disease and symptoms with minimal exertion, I recommend admission to the hospital and starting heparin drip. We will consult CTS. I requested an echocardiogram.     I have independently reviewed the above radiologic studies and discussed with the patient   Recent Lab Findings: Lab Results  Component Value Date   WBC 10.3 06/04/2019   HGB 16.1 (H) 06/04/2019   HCT 47.6 (H) 06/04/2019   PLT 345 06/04/2019   GLUCOSE 203 (H) 06/04/2019   CHOL 216 (H) 09/30/2018   TRIG 421 (H) 09/30/2018   HDL 58 09/30/2018   [place]  09/30/2018     Comment:     . LDL cholesterol not calculated. Triglyceride levels  greater than 400 mg/dL invalidate calculated LDL results. . Reference range: <100 . Desirable range <100 mg/dL for primary prevention;   <70 mg/dL for patients with CHD or diabetic patients  with > or = 2 CHD risk factors. Marland Kitchen LDL-C is now calculated using the Martin-Hopkins  calculation, which is a validated novel method providing  better accuracy than the Friedewald equation in the  estimation of LDL-C.  Cresenciano Genre et al. Annamaria Helling. WG:[id number]): 2061-2068  (http://education.[facility].com/faq/FAQ164)    ALT 19 09/30/2018   AST 21 09/30/2018   NA 139 06/04/2019   K 3.6 06/04/2019   CL 97 06/04/2019   CREATININE 0.63 06/04/2019   BUN 12 06/04/2019   CO2 30 (H) 06/04/2019   INR 0.9 03/30/2018   HGBA1C 9.2 (A) 04/05/2019      Assessment / Plan:      1.  Severe multivessel coronary artery disease-continue to treat medically.  2. Diabetes Mellitus Type 2-On several PO medications at home. She admits it has been uncontrolled and the  source of her weight gain.      Education on diet and explained she will be on insulin while inpatient. HGBA1c  9 3.  Dyslipidemia-Continue lipitor 4.  Hypertension-PRN IV medications if needed. No ACEI preop 5.  Obesity-BMI 30.66 6.  Depression/anxiety-Continue cymbalta 7.  GERD-On Nexium at home  Plan: Procedure of CABG explained to the patient and she agreed to proceed. Preop testing orders have been placed.  Echocardiogram is scheduled for today.  We recommend her discontinuing her ACE inhibitor and Invokana  ( due to peri operative risk of euglycemic ketoacidosis) preop. Plan  CABG on Monday    With 3 v cad in setting of DM , agree with cardiology, CABG offers the best option for complete revascularization and relief of symptoms .  The goals risks and alternatives of the planned surgical procedure CABG   have been discussed with the patient in detail. The risks of the procedure including death, infection, stroke, myocardial infarction, bleeding, blood transfusion have all been discussed specifically.  I have quoted Tonya Chandler a 2 % of perioperative mortality and a complication rate as high as 30%. [name] questions have been answered.Tonya Chandler is willing  to proceed with the planned procedure.

## 2019-06-11 NOTE — Progress Notes (Signed)
  Echocardiogram 2D Echocardiogram with definity has been performed.  Tonya Chandler M 06/11/2019, 11:37 AM

## 2019-06-11 NOTE — Progress Notes (Signed)
TR BAND REMOVAL  LOCATION:    Right  radial  DEFLATED PER PROTOCOL:    Yes.    TIME BAND OFF / DRESSING APPLIED:    1150am Gauze , tegaderm applied with coban  SITE UPON ARRIVAL:    Level 0  SITE AFTER BAND REMOVAL:    Level 0  CIRCULATION SENSATION AND MOVEMENT:    Within Normal Limits   Yes.    COMMENTS:   Post care given to patient

## 2019-06-12 ENCOUNTER — Inpatient Hospital Stay (HOSPITAL_COMMUNITY): Payer: BC Managed Care – PPO

## 2019-06-12 DIAGNOSIS — E119 Type 2 diabetes mellitus without complications: Secondary | ICD-10-CM

## 2019-06-12 DIAGNOSIS — I10 Essential (primary) hypertension: Secondary | ICD-10-CM

## 2019-06-12 DIAGNOSIS — E785 Hyperlipidemia, unspecified: Secondary | ICD-10-CM | POA: Diagnosis not present

## 2019-06-12 DIAGNOSIS — I2510 Atherosclerotic heart disease of native coronary artery without angina pectoris: Secondary | ICD-10-CM | POA: Diagnosis not present

## 2019-06-12 DIAGNOSIS — Z01810 Encounter for preprocedural cardiovascular examination: Secondary | ICD-10-CM

## 2019-06-12 LAB — BASIC METABOLIC PANEL
Anion gap: 12 (ref 5–15)
BUN: 12 mg/dL (ref 6–20)
CO2: 27 mmol/L (ref 22–32)
Calcium: 9.2 mg/dL (ref 8.9–10.3)
Chloride: 99 mmol/L (ref 98–111)
Creatinine, Ser: 0.68 mg/dL (ref 0.44–1.00)
GFR calc Af Amer: >60 mL/min (ref >60)
GFR calc non Af Amer: >60 mL/min (ref >60)
Glucose, Bld: 173 mg/dL — ABNORMAL HIGH (ref 70–99)
Potassium: 3.9 mmol/L (ref 3.5–5.1)
Sodium: 138 mmol/L (ref 135–145)

## 2019-06-12 LAB — CBC
HCT: 48.2 % — ABNORMAL HIGH (ref 36.0–46.0)
Hemoglobin: 15.8 g/dL — ABNORMAL HIGH (ref 12.0–15.0)
MCH: 30 pg (ref 26.0–34.0)
MCHC: 32.8 g/dL (ref 30.0–36.0)
MCV: 91.5 fL (ref 80.0–100.0)
Platelets: 291 10*3/uL (ref 150–400)
RBC: 5.27 MIL/uL — ABNORMAL HIGH (ref 3.87–5.11)
RDW: 12.6 % (ref 11.5–15.5)
WBC: 10.9 10*3/uL — ABNORMAL HIGH (ref 4.0–10.5)
nRBC: 0 % (ref 0.0–0.2)

## 2019-06-12 LAB — GLUCOSE, CAPILLARY
Glucose-Capillary: 185 mg/dL — ABNORMAL HIGH (ref 70–99)
Glucose-Capillary: 131 mg/dL — ABNORMAL HIGH (ref 70–99)
Glucose-Capillary: 233 mg/dL — ABNORMAL HIGH (ref 70–99)
Glucose-Capillary: 146 mg/dL — ABNORMAL HIGH (ref 70–99)

## 2019-06-12 LAB — HEPARIN LEVEL (UNFRACTIONATED)
Heparin Unfractionated: 0.47 IU/mL (ref 0.30–0.70)
Heparin Unfractionated: 0.45 IU/mL (ref 0.30–0.70)

## 2019-06-12 NOTE — Progress Notes (Signed)
ANTICOAGULATION CONSULT NOTE - Initial Consult  Pharmacy Consult for heparin Indication: chest pain/ACS  Allergies  Allergen Reactions  . Adhesive [Tape] Hives    Welts--PLEASE USE HYPO ALLERGIC TAPE  . Ampicillin Diarrhea    Did it involve swelling of the face/tongue/throat, SOB, or low BP? No Did it involve sudden or severe rash/hives, skin peeling, or any reaction on the inside of your mouth or nose? No Did you need to seek medical attention at a hospital or doctor's office? No When did it last happen?30-40 years ago If all above answers are "NO", may proceed with cephalosporin use.   Marland Kitchen Cefprozil Other (See Comments)    Acid reflux/indigestion issues  . Meloxicam Other (See Comments)    Acid reflux/indigestion issues    Patient Measurements: Height: 5' (152.4 cm) Weight: 71.5 kg (157 lb 11.2 oz) IBW/kg (Calculated) : 45.5     Vital Signs: Temp: 98.2 F (36.8 C) (05/22 1139) Temp Source: Oral (05/22 1139) BP: 132/92 (05/22 1139) Pulse Rate: 72 (05/22 1139)  Labs: Recent Labs    06/11/19 2307 06/12/19 0557 06/12/19 1148  HGB  --  15.8*  --   HCT  --  48.2*  --   PLT  --  291  --   HEPARINUNFRC 0.16* 0.47 0.45  CREATININE  --  0.68  --     Estimated Creatinine Clearance: 66 mL/min (by C-G formula based on SCr of 0.68 mg/dL).   Medical History: Past Medical History:  Diagnosis Date  . Abnormal ECG 04/01/2018  . Abnormal stress echocardiogram 05/24/2019   Formatting of this note might be different from the original. Added automatically from request for surgery [id number]  . Anemia   . Anxiety   . Atherosclerosis of native coronary artery with angina pectoris ([place]) 05/10/2015   Formatting of this note might be different from the original. 30% lesion-Chiu  . Bilateral carpal tunnel syndrome 05/23/2017  . Bilateral knee pain 02/16/2010   Qualifier: Diagnosis of  By: Koleen Nimrod MD, Dellis Filbert    . Carpal tunnel syndrome, right 12/16/2017  . Colon polyps   .  Depression   . DEPRESSION 02/16/2010   Qualifier: Diagnosis of  By: Koleen Nimrod MD, Dellis Filbert    . Depression, major, in partial remission ([place]) 07/14/2015  . Diabetes mellitus   . Diverticulosis   . Dupuytren's contracture of right hand 06/24/2017  . Essential hypertension 02/16/2010   Qualifier: Diagnosis of  By: Koleen Nimrod MD, Dellis Filbert    . Gastro-esophageal reflux   . Gastrointestinal ulcer   . Generalized anxiety disorder 03/14/2015  . GERD 02/16/2010   Qualifier: Diagnosis of  By: Koleen Nimrod MD, Dellis Filbert    . H/O: GI bleed    Michela Pitcher it was due to taking metformin  . Hyperlipidemia   . Hyperlipidemia LDL goal <70 02/16/2010   Qualifier: Diagnosis of  By: Koleen Nimrod MD, Dellis Filbert    . Hypertension   . IBS (irritable bowel syndrome)   . Impingement syndrome of shoulder, right 06/24/2017  . Midline low back pain with left-sided sciatica 12/29/2013  . Musculoskeletal chest pain 05/04/2015  . Obesity   . Precordial pain 05/04/2015  . Primary insomnia 03/14/2015  . Radiculitis of right cervical region 01/20/2018  . Radiculopathy 04/02/2018  . Right hand pain 06/24/2017  . RLS (restless legs syndrome) 07/14/2015  . Type 2 diabetes mellitus with diabetic neuropathy, with long-term current use of insulin ([place]) 02/16/2010   Qualifier: Diagnosis of  By: Koleen Nimrod MD, Dellis Filbert      Assessment: [phone]  admitted with CP. In the cath lab, was found to have 3v CAD. Plan for CABG on 5/24. Pharmacy consulted to begin heparin drip 8hr post sheath removal (out 830 TR band placed).  Heparin level remains therapeutic this afternoon at 0.45.  CBC stable and no active signs of bleeding or line issues per nurse.  Goal of Therapy:  Heparin level 0.3-0.7 units/ml Monitor platelets by anticoagulation protocol: Yes   Plan:  Continue heparin drip rate at 1100 units/hr  Monitor daily heparin level and CBC Monitor for s/sx of bleeding   Kennon Holter, PharmD PGY1 Ambulatory Care Pharmacy Resident 06/12/2019 1:06  PM

## 2019-06-12 NOTE — Plan of Care (Signed)

## 2019-06-12 NOTE — Progress Notes (Signed)
Progress Note  Patient Name: Tonya Chandler Date of Encounter: 06/12/2019  Primary Cardiologist:   Jenne Campus, MD   Subjective   She had a little twinge of chest pain last night.   Inpatient Medications    Scheduled Meds: . aspirin EC  81 mg Oral Daily  . atorvastatin  40 mg Oral q1800  . canagliflozin  300 mg Oral Daily  . cyclobenzaprine  10 mg Oral QHS  . [START ON 06/13/2019] Dulaglutide  1.5 mg Subcutaneous Q Sun  . DULoxetine  60 mg Oral Daily  . [START ON 06/14/2019] epinephrine  0-10 mcg/min Intravenous To OR  . estradiol  1 mg Oral QHS  . gabapentin  1,600 mg Oral QHS  . gabapentin  800 mg Oral BID  . [START ON 06/14/2019] heparin-papaverine-plasmalyte irrigation   Irrigation To OR  . [START ON 06/14/2019] insulin   Intravenous To OR  . lisinopril  20 mg Oral Daily  . magnesium oxide  800 mg Oral QHS  . [START ON 06/14/2019] magnesium sulfate  40 mEq Other To OR  . metoprolol tartrate  25 mg Oral BID  . mirtazapine  45 mg Oral QHS  . pantoprazole  40 mg Oral Daily  . [START ON 06/14/2019] phenylephrine  30-200 mcg/min Intravenous To OR  . [START ON 06/14/2019] potassium chloride  80 mEq Other To OR  . rOPINIRole  1.5 mg Oral QHS  . sodium chloride flush  3 mL Intravenous Q12H  . sodium chloride flush  3 mL Intravenous Q12H  . [START ON 06/14/2019] tranexamic acid  15 mg/kg Intravenous To OR  . [START ON 06/14/2019] tranexamic acid  2 mg/kg Intracatheter To OR  . triamterene-hydrochlorothiazide  1 tablet Oral Daily  . vitamin B-12  2,500 mcg Oral Daily   Continuous Infusions: . sodium chloride    . [START ON 06/14/2019] dexmedetomidine    . [START ON 06/14/2019] heparin 30,000 units/NS 1000 mL solution for CELLSAVER    . heparin 1,100 Units/hr (06/12/19 0606)  . [START ON 06/14/2019] levofloxacin (LEVAQUIN) IV    . [START ON 06/14/2019] milrinone    . [START ON 06/14/2019] nitroGLYCERIN    . [START ON 06/14/2019] norepinephrine    . [START ON 06/14/2019] tranexamic  acid (CYKLOKAPRON) infusion ([facility])    . [START ON 06/14/2019] vancomycin     PRN Meds: sodium chloride, acetaminophen, famotidine, loteprednol, nitroGLYCERIN, ondansetron (ZOFRAN) IV, sodium chloride flush   Vital Signs    Vitals:   06/11/19 2054 06/12/19 0021 06/12/19 0550 06/12/19 0557  BP: (!) 148/80 131/71 (!) 152/87   Pulse: 79 73 79   Resp: 18 17 20    Temp: 98.2 F (36.8 C) 98.2 F (36.8 C) 98.3 F (36.8 C)   TempSrc: Oral Oral Oral   SpO2: 95% 94% 93%   Weight:    71.5 kg  Height:        Intake/Output Summary (Last 24 hours) at 06/12/2019 0728 Last data filed at 06/12/2019 0606 Gross per 24 hour  Intake 1103.69 ml  Output 3525 ml  Net -2421.31 ml   Filed Weights   06/11/19 0600 06/11/19 1159 06/12/19 0557  Weight: 71.2 kg 72.1 kg 71.5 kg    Telemetry    NSR - Personally Reviewed  ECG    NA - Personally Reviewed  Physical Exam   GEN: No acute distress.   Neck: No JVD Cardiac: RRR, soft apical systolic murmurs, rubs, or gallops.  Respiratory: Clear  to auscultation bilaterally. GI: Soft, nontender,  non-distended  MS: No  edema; No deformity.  Right radial without bruising or bleeding Neuro:  Nonfocal  Psych: Normal affect   Labs    Chemistry Recent Labs  Lab 06/12/19 0557  NA 138  K 3.9  CL 99  CO2 27  GLUCOSE 173*  BUN 12  CREATININE 0.68  CALCIUM 9.2  GFRNONAA >60  GFRAA >60  ANIONGAP 12     Hematology Recent Labs  Lab 06/12/19 0557  WBC 10.9*  RBC 5.27*  HGB 15.8*  HCT 48.2*  MCV 91.5  MCH 30.0  MCHC 32.8  RDW 12.6  PLT 291    Cardiac EnzymesNo results for input(s): TROPONINI in the last 168 hours. No results for input(s): TROPIPOC in the last 168 hours.   BNPNo results for input(s): BNP, PROBNP in the last 168 hours.   DDimer No results for input(s): DDIMER in the last 168 hours.   Radiology    CARDIAC CATHETERIZATION  Result Date: 06/11/2019  The left ventricular systolic function is normal.  LV end diastolic  pressure is mildly elevated.  The left ventricular ejection fraction is 55-65% by visual estimate.  Prox RCA lesion is 99% stenosed.  Ost LM lesion is 40% stenosed.  Dist Cx lesion is 100% stenosed.  Prox LAD lesion is 95% stenosed.  Mid LAD lesion is 40% stenosed.  1.  Severe three-vessel coronary artery disease including severe stenosis in the proximal LAD, occluded mid to distal left circumflex with collaterals to the OM's, subtotal occlusion of the proximal right coronary artery with left-to-right collaterals. 2.  Normal LV systolic function mildly elevated left ventricular end-diastolic pressure. Recommendations: Given three-vessel coronary artery disease and diabetic status, recommend CABG. Due to severity of her disease and symptoms with minimal exertion, I recommend admission to the hospital and starting heparin drip. We will consult CTS. I requested an echocardiogram.   ECHOCARDIOGRAM COMPLETE  Result Date: 06/11/2019    ECHOCARDIOGRAM REPORT   Patient Name:   Tonya Chandler  Date of Exam: 06/11/2019 Medical Rec #:  [id number]  Height:       60.0 in Accession #:    [id number] Weight:       157.0 lb Date of Birth:  February 22, 1960  BSA:          1.684 m Patient Age:    59 years   BP:           138/79 mmHg Patient Gender: F          HR:           76 bpm. Exam Location:  Inpatient Procedure: 2D Echo and Intracardiac Opacification Agent Indications:    Chest Pain 786.50 / R07.9  History:        Patient has no prior history of Echocardiogram examinations.                 Abnormal ECG; Risk Factors:Hypertension, Diabetes and                 Dyslipidemia. Anemia, GERD.  Sonographer:    Darlina Sicilian RDCS Referring Phys: [place]  1. Left ventricular ejection fraction, by estimation, is 45 to 50%. The left ventricle has mildly decreased function. The left ventricle demonstrates global hypokinesis. There is mild asymmetric left ventricular hypertrophy of the septal segment. Left ventricular  diastolic parameters are consistent with Grade I diastolic dysfunction (impaired relaxation).  2. Right ventricular systolic function is normal. The right ventricular size is normal. There is  normal pulmonary artery systolic pressure. The estimated right ventricular systolic pressure is 0000000 mmHg.  3. Left atrial size was mildly dilated.  4. The mitral valve is normal in structure. Mild mitral valve regurgitation. No evidence of mitral stenosis.  5. The aortic valve is tricuspid. Aortic valve regurgitation is not visualized. No aortic stenosis is present.  6. The inferior vena cava is normal in size with greater than 50% respiratory variability, suggesting right atrial pressure of 3 mmHg. FINDINGS  Left Ventricle: Left ventricular ejection fraction, by estimation, is 45 to 50%. The left ventricle has mildly decreased function. The left ventricle demonstrates global hypokinesis. Definity contrast agent was given IV to delineate the left ventricular  endocardial borders. The left ventricular internal cavity size was normal in size. There is mild asymmetric left ventricular hypertrophy of the septal segment. Left ventricular diastolic parameters are consistent with Grade I diastolic dysfunction (impaired relaxation). Right Ventricle: The right ventricular size is normal. No increase in right ventricular wall thickness. Right ventricular systolic function is normal. There is normal pulmonary artery systolic pressure. The tricuspid regurgitant velocity is 2.06 m/s, and  with an assumed right atrial pressure of 3 mmHg, the estimated right ventricular systolic pressure is 0000000 mmHg. Left Atrium: Left atrial size was mildly dilated. Right Atrium: Right atrial size was normal in size. Pericardium: Trivial pericardial effusion is present. Mitral Valve: The mitral valve is normal in structure. Normal mobility of the mitral valve leaflets. Mild mitral valve regurgitation. No evidence of mitral valve stenosis. Tricuspid Valve:  The tricuspid valve is normal in structure. Tricuspid valve regurgitation is mild . No evidence of tricuspid stenosis. Aortic Valve: The aortic valve is tricuspid. Aortic valve regurgitation is not visualized. No aortic stenosis is present. Pulmonic Valve: The pulmonic valve was normal in structure. Pulmonic valve regurgitation is not visualized. No evidence of pulmonic stenosis. Aorta: The aortic root and ascending aorta are structurally normal, with no evidence of dilitation. Venous: The inferior vena cava is normal in size with greater than 50% respiratory variability, suggesting right atrial pressure of 3 mmHg. IAS/Shunts: No atrial level shunt detected by color flow Doppler.  LEFT VENTRICLE PLAX 2D LVIDd:         4.90 cm      Diastology LVIDs:         3.80 cm      LV e' lateral:   5.33 cm/s LV PW:         0.90 cm      LV E/e' lateral: 14.1 LV IVS:        1.20 cm      LV e' medial:    5.55 cm/s LVOT diam:     1.70 cm      LV E/e' medial:  13.5 LV SV:         34 LV SV Index:   20 LVOT Area:     2.27 cm  LV Volumes (MOD) LV vol d, MOD A2C: 115.0 ml LV vol d, MOD A4C: 142.0 ml LV vol s, MOD A2C: 50.9 ml LV vol s, MOD A4C: 76.6 ml LV SV MOD A2C:     64.1 ml LV SV MOD A4C:     142.0 ml LV SV MOD BP:      66.5 ml RIGHT VENTRICLE RV S prime:     9.36 cm/s TAPSE (M-mode): 2.0 cm LEFT ATRIUM             Index       RIGHT ATRIUM  Index LA diam:        4.30 cm 2.55 cm/m  RA Area:     10.30 cm LA Vol (A2C):   50.8 ml 30.16 ml/m RA Volume:   19.70 ml  11.70 ml/m LA Vol (A4C):   57.5 ml 34.14 ml/m LA Biplane Vol: 54.8 ml 32.54 ml/m  AORTIC VALVE LVOT Vmax:   79.00 cm/s LVOT Vmean:  47.600 cm/s LVOT VTI:    0.150 m  AORTA Ao Root diam: 3.00 cm MITRAL VALVE               TRICUSPID VALVE MV Area (PHT): 5.13 cm    TR Peak grad:   17.0 mmHg MV Decel Time: 148 msec    TR Vmax:        206.00 cm/s MV E velocity: 75.00 cm/s MV A velocity: 81.80 cm/s  SHUNTS MV E/A ratio:  0.92        Systemic VTI:  0.15 m                             Systemic Diam: 1.70 cm Cherlynn Kaiser MD Electronically signed by Cherlynn Kaiser MD Signature Date/Time: 06/11/2019/7:40:37 PM    Final     Cardiac Studies   Diagnostic cath Dominance: Co-dominant   Echo:    1. Left ventricular ejection fraction, by estimation, is 45 to 50%. The  left ventricle has mildly decreased function. The left ventricle  demonstrates global hypokinesis. There is mild asymmetric left ventricular  hypertrophy of the septal segment. Left  ventricular diastolic parameters are consistent with Grade I diastolic  dysfunction (impaired relaxation).  2. Right ventricular systolic function is normal. The right ventricular  size is normal. There is normal pulmonary artery systolic pressure. The  estimated right ventricular systolic pressure is 0000000 mmHg.  3. Left atrial size was mildly dilated.  4. The mitral valve is normal in structure. Mild mitral valve  regurgitation. No evidence of mitral stenosis.  5. The aortic valve is tricuspid. Aortic valve regurgitation is not  visualized. No aortic stenosis is present.  6. The inferior vena cava is normal in size with greater than 50%  respiratory variability, suggesting right atrial pressure of 3 mmHg.  Patient Profile     59 y.o. female who was seen for the evaluation of chest pain at the request of Emeterio Reeve, DO.  Past medical history significant for diabetes mellitus type 2, dyslipidemia, hypertension, coronary artery disease.  She   Assessment & Plan    CAD: Three vessel for CABG.  PFTs with minimal obstructive disease.   Vascular Dopplers ordered.  Surgery is scheduled for Monday .  Noted mild asymmetric septal hypertrophy on echo.   DM:    A1C 9.2.  Continue current therapy but she will need better control post op.  Held Invokana per preop suggestion.    HTN:   BP running slightly high.  Continue current therapy.  Held ACE pre preo suggestion.    DYSLIPIDEMIA:  Continue moderate dose  statin.    For questions or updates, please contact [place] Please consult [email or web address] for contact info under Cardiology/STEMI.   Signed, Minus Breeding, MD  06/12/2019, 7:28 AM

## 2019-06-12 NOTE — Progress Notes (Signed)
ANTICOAGULATION CONSULT NOTE - Initial Consult  Pharmacy Consult for heparin Indication: chest pain/ACS  Allergies  Allergen Reactions  . Adhesive [Tape] Hives    Welts--PLEASE USE HYPO ALLERGIC TAPE  . Ampicillin Diarrhea    Did it involve swelling of the face/tongue/throat, SOB, or low BP? No Did it involve sudden or severe rash/hives, skin peeling, or any reaction on the inside of your mouth or nose? No Did you need to seek medical attention at a hospital or doctor's office? No When did it last happen?30-40 years ago If all above answers are "NO", may proceed with cephalosporin use.   Marland Kitchen Cefprozil Other (See Comments)    Acid reflux/indigestion issues  . Meloxicam Other (See Comments)    Acid reflux/indigestion issues    Patient Measurements: Height: 5' (152.4 cm) Weight: 71.5 kg (157 lb 11.2 oz) IBW/kg (Calculated) : 45.5     Vital Signs: Temp: 98.5 F (36.9 C) (05/22 0826) Temp Source: Oral (05/22 0826) BP: 138/88 (05/22 0826) Pulse Rate: 84 (05/22 0826)  Labs: Recent Labs    06/11/19 2307 06/12/19 0557  HGB  --  15.8*  HCT  --  48.2*  PLT  --  291  HEPARINUNFRC 0.16* 0.47  CREATININE  --  0.68    Estimated Creatinine Clearance: 66 mL/min (by C-G formula based on SCr of 0.68 mg/dL).   Medical History: Past Medical History:  Diagnosis Date  . Abnormal ECG 04/01/2018  . Abnormal stress echocardiogram 05/24/2019   Formatting of this note might be different from the original. Added automatically from request for surgery [id number]  . Anemia   . Anxiety   . Atherosclerosis of native coronary artery with angina pectoris ([place]) 05/10/2015   Formatting of this note might be different from the original. 30% lesion-Chiu  . Bilateral carpal tunnel syndrome 05/23/2017  . Bilateral knee pain 02/16/2010   Qualifier: Diagnosis of  By: Koleen Nimrod MD, Dellis Filbert    . Carpal tunnel syndrome, right 12/16/2017  . Colon polyps   . Depression   . DEPRESSION 02/16/2010   Qualifier: Diagnosis of  By: Koleen Nimrod MD, Dellis Filbert    . Depression, major, in partial remission ([place]) 07/14/2015  . Diabetes mellitus   . Diverticulosis   . Dupuytren's contracture of right hand 06/24/2017  . Essential hypertension 02/16/2010   Qualifier: Diagnosis of  By: Koleen Nimrod MD, Dellis Filbert    . Gastro-esophageal reflux   . Gastrointestinal ulcer   . Generalized anxiety disorder 03/14/2015  . GERD 02/16/2010   Qualifier: Diagnosis of  By: Koleen Nimrod MD, Dellis Filbert    . H/O: GI bleed    Michela Pitcher it was due to taking metformin  . Hyperlipidemia   . Hyperlipidemia LDL goal <70 02/16/2010   Qualifier: Diagnosis of  By: Koleen Nimrod MD, Dellis Filbert    . Hypertension   . IBS (irritable bowel syndrome)   . Impingement syndrome of shoulder, right 06/24/2017  . Midline low back pain with left-sided sciatica 12/29/2013  . Musculoskeletal chest pain 05/04/2015  . Obesity   . Precordial pain 05/04/2015  . Primary insomnia 03/14/2015  . Radiculitis of right cervical region 01/20/2018  . Radiculopathy 04/02/2018  . Right hand pain 06/24/2017  . RLS (restless legs syndrome) 07/14/2015  . Type 2 diabetes mellitus with diabetic neuropathy, with long-term current use of insulin ([place]) 02/16/2010   Qualifier: Diagnosis of  By: Koleen Nimrod MD, Dellis Filbert      Assessment: Tonya Chandler admitted with CP. In the cath lab, was found to have 3v CAD. Plan for CABG  on 5/24. Pharmacy consulted to begin heparin drip 8hr post sheath removal (out 830 TR band placed).  Heparin level therapeutic this AM at 0.47 CBC stable and no active signs of bleeding or line issues per nurse  Goal of Therapy:  Heparin level 0.3-0.7 units/ml Monitor platelets by anticoagulation protocol: Yes   Plan:  Continue heparin drip rate at 1100 units/hr  Check confirmatory heparin level in 6 hours  Monitor daily heparin level and CBC Monitor for s/s bleeding   Kennon Holter, PharmD PGY1 Ambulatory Care Pharmacy Resident 06/12/2019 8:57 AM

## 2019-06-12 NOTE — Progress Notes (Signed)
VASCULAR LAB PRELIMINARY  PRELIMINARY  PRELIMINARY  PRELIMINARY  Pre CABG Dopplers completed.    Preliminary report:  See CV proc for preliminary results.   KANADY, CANDACE, RVT 06/12/2019, 12:59 PM

## 2019-06-13 ENCOUNTER — Inpatient Hospital Stay (HOSPITAL_COMMUNITY): Payer: BC Managed Care – PPO

## 2019-06-13 DIAGNOSIS — E119 Type 2 diabetes mellitus without complications: Secondary | ICD-10-CM | POA: Diagnosis not present

## 2019-06-13 DIAGNOSIS — Z01818 Encounter for other preprocedural examination: Secondary | ICD-10-CM | POA: Diagnosis not present

## 2019-06-13 DIAGNOSIS — I2510 Atherosclerotic heart disease of native coronary artery without angina pectoris: Secondary | ICD-10-CM | POA: Diagnosis not present

## 2019-06-13 DIAGNOSIS — I10 Essential (primary) hypertension: Secondary | ICD-10-CM | POA: Diagnosis not present

## 2019-06-13 DIAGNOSIS — I517 Cardiomegaly: Secondary | ICD-10-CM | POA: Diagnosis not present

## 2019-06-13 DIAGNOSIS — E785 Hyperlipidemia, unspecified: Secondary | ICD-10-CM | POA: Diagnosis not present

## 2019-06-13 LAB — URINALYSIS, ROUTINE W REFLEX MICROSCOPIC
Bilirubin Urine: NEGATIVE
Glucose, UA: >=500 mg/dL — AB
Hgb urine dipstick: NEGATIVE
Ketones, ur: NEGATIVE mg/dL
Leukocytes,Ua: NEGATIVE
Nitrite: NEGATIVE
Protein, ur: NEGATIVE mg/dL
Specific Gravity, Urine: 1.017 (ref 1.005–1.030)
pH: 7 (ref 5.0–8.0)

## 2019-06-13 LAB — BLOOD GAS, ARTERIAL
Acid-Base Excess: 5.5 mmol/L — ABNORMAL HIGH (ref 0.0–2.0)
Bicarbonate: 30.1 mmol/L — ABNORMAL HIGH (ref 20.0–28.0)
Drawn by: 24686
FIO2: 21
O2 Saturation: 94.8 %
Patient temperature: 37
pCO2 arterial: 48.7 mmHg — ABNORMAL HIGH (ref 32.0–48.0)
pH, Arterial: 7.407 (ref 7.350–7.450)
pO2, Arterial: 75.7 mmHg — ABNORMAL LOW (ref 83.0–108.0)

## 2019-06-13 LAB — CBC
HCT: 46.8 % — ABNORMAL HIGH (ref 36.0–46.0)
Hemoglobin: 15.6 g/dL — ABNORMAL HIGH (ref 12.0–15.0)
MCH: 30.1 pg (ref 26.0–34.0)
MCHC: 33.3 g/dL (ref 30.0–36.0)
MCV: 90.3 fL (ref 80.0–100.0)
Platelets: 297 10*3/uL (ref 150–400)
RBC: 5.18 MIL/uL — ABNORMAL HIGH (ref 3.87–5.11)
RDW: 12.5 % (ref 11.5–15.5)
WBC: 7.6 10*3/uL (ref 4.0–10.5)
nRBC: 0 % (ref 0.0–0.2)

## 2019-06-13 LAB — COMPREHENSIVE METABOLIC PANEL
ALT: 14 U/L (ref 0–44)
AST: 18 U/L (ref 15–41)
Albumin: 3.6 g/dL (ref 3.5–5.0)
Alkaline Phosphatase: 93 U/L (ref 38–126)
Anion gap: 11 (ref 5–15)
BUN: 10 mg/dL (ref 6–20)
CO2: 29 mmol/L (ref 22–32)
Calcium: 8.9 mg/dL (ref 8.9–10.3)
Chloride: 98 mmol/L (ref 98–111)
Creatinine, Ser: 0.5 mg/dL (ref 0.44–1.00)
GFR calc Af Amer: >60 mL/min (ref >60)
GFR calc non Af Amer: >60 mL/min (ref >60)
Glucose, Bld: 196 mg/dL — ABNORMAL HIGH (ref 70–99)
Potassium: 4.2 mmol/L (ref 3.5–5.1)
Sodium: 138 mmol/L (ref 135–145)
Total Bilirubin: 1 mg/dL (ref 0.3–1.2)
Total Protein: 6.8 g/dL (ref 6.5–8.1)

## 2019-06-13 LAB — PROTIME-INR
INR: 0.9 (ref 0.8–1.2)
Prothrombin Time: 11.9 seconds (ref 11.4–15.2)

## 2019-06-13 LAB — GLUCOSE, CAPILLARY
Glucose-Capillary: 217 mg/dL — ABNORMAL HIGH (ref 70–99)
Glucose-Capillary: 217 mg/dL — ABNORMAL HIGH (ref 70–99)
Glucose-Capillary: 174 mg/dL — ABNORMAL HIGH (ref 70–99)
Glucose-Capillary: 282 mg/dL — ABNORMAL HIGH (ref 70–99)

## 2019-06-13 LAB — HEMOGLOBIN A1C
Hgb A1c MFr Bld: 7.7 % — ABNORMAL HIGH (ref 4.8–5.6)
Mean Plasma Glucose: 174.29 mg/dL

## 2019-06-13 LAB — HEPARIN LEVEL (UNFRACTIONATED): Heparin Unfractionated: 0.42 IU/mL (ref 0.30–0.70)

## 2019-06-13 LAB — SURGICAL PCR SCREEN
MRSA, PCR: NEGATIVE
Staphylococcus aureus: NEGATIVE

## 2019-06-13 LAB — APTT: aPTT: 60 seconds — ABNORMAL HIGH (ref 24–36)

## 2019-06-13 MED ORDER — BISACODYL 5 MG PO TBEC: 5.0000 mg | DELAYED_RELEASE_TABLET | Freq: Once | ORAL | Status: DC

## 2019-06-13 MED ORDER — INSULIN ASPART 100 UNIT/ML ~~LOC~~ SOLN
0.0000 [IU] | Freq: Three times a day (TID) | SUBCUTANEOUS | Status: DC
  Administered 2019-06-13: 2 [IU] via SUBCUTANEOUS
  Administered 2019-06-13: 3 [IU] via SUBCUTANEOUS
  Administered 2019-06-14: 7 [IU] via SUBCUTANEOUS

## 2019-06-13 MED ORDER — CHLORHEXIDINE GLUCONATE CLOTH 2 % EX PADS
6.0000 | MEDICATED_PAD | Freq: Once | CUTANEOUS | Status: AC
  Administered 2019-06-14: 6 via TOPICAL

## 2019-06-13 MED ORDER — CHLORHEXIDINE GLUCONATE 0.12 % MT SOLN
15.0000 mL | Freq: Once | OROMUCOSAL | Status: AC
  Administered 2019-06-14: 15 mL via OROMUCOSAL
  Filled 2019-06-13: qty 15

## 2019-06-13 MED ORDER — TEMAZEPAM 7.5 MG PO CAPS: 15.0000 mg | ORAL_CAPSULE | Freq: Once | ORAL | Status: DC | PRN

## 2019-06-13 NOTE — Progress Notes (Signed)
ANTICOAGULATION CONSULT NOTE - Initial Consult  Pharmacy Consult for heparin Indication: chest pain/ACS  Allergies  Allergen Reactions  . Adhesive [Tape] Hives    Welts--PLEASE USE HYPO ALLERGIC TAPE  . Ampicillin Diarrhea    Did it involve swelling of the face/tongue/throat, SOB, or low BP? No Did it involve sudden or severe rash/hives, skin peeling, or any reaction on the inside of your mouth or nose? No Did you need to seek medical attention at a hospital or doctor's office? No When did it last happen?30-40 years ago If all above answers are "NO", may proceed with cephalosporin use.   Marland Kitchen Cefprozil Other (See Comments)    Acid reflux/indigestion issues  . Meloxicam Other (See Comments)    Acid reflux/indigestion issues    Patient Measurements: Height: 5' (152.4 cm) Weight: 71.2 kg (157 lb) IBW/kg (Calculated) : 45.5     Vital Signs: Temp: 98.4 F (36.9 C) (05/23 0748) Temp Source: Oral (05/23 0748) BP: 149/88 (05/23 0748) Pulse Rate: 78 (05/23 0748)  Labs: Recent Labs    06/12/19 0557 06/12/19 1148 06/13/19 0740  HGB 15.8*  --  15.6*  HCT 48.2*  --  46.8*  PLT 291  --  297  APTT  --   --  60*  LABPROT  --   --  11.9  INR  --   --  0.9  HEPARINUNFRC 0.47 0.45 0.42  CREATININE 0.68  --  0.50    Estimated Creatinine Clearance: 65.9 mL/min (by C-G formula based on SCr of 0.5 mg/dL).   Medical History: Past Medical History:  Diagnosis Date  . Abnormal ECG 04/01/2018  . Abnormal stress echocardiogram 05/24/2019   Formatting of this note might be different from the original. Added automatically from request for surgery [id number]  . Anemia   . Anxiety   . Atherosclerosis of native coronary artery with angina pectoris ([place]) 05/10/2015   Formatting of this note might be different from the original. 30% lesion-Chiu  . Bilateral carpal tunnel syndrome 05/23/2017  . Bilateral knee pain 02/16/2010   Qualifier: Diagnosis of  By: Koleen Nimrod MD, Dellis Filbert    . Carpal  tunnel syndrome, right 12/16/2017  . Colon polyps   . Depression   . DEPRESSION 02/16/2010   Qualifier: Diagnosis of  By: Koleen Nimrod MD, Dellis Filbert    . Depression, major, in partial remission ([place]) 07/14/2015  . Diabetes mellitus   . Diverticulosis   . Dupuytren's contracture of right hand 06/24/2017  . Essential hypertension 02/16/2010   Qualifier: Diagnosis of  By: Koleen Nimrod MD, Dellis Filbert    . Gastro-esophageal reflux   . Gastrointestinal ulcer   . Generalized anxiety disorder 03/14/2015  . GERD 02/16/2010   Qualifier: Diagnosis of  By: Koleen Nimrod MD, Dellis Filbert    . H/O: GI bleed    Michela Pitcher it was due to taking metformin  . Hyperlipidemia   . Hyperlipidemia LDL goal <70 02/16/2010   Qualifier: Diagnosis of  By: Koleen Nimrod MD, Dellis Filbert    . Hypertension   . IBS (irritable bowel syndrome)   . Impingement syndrome of shoulder, right 06/24/2017  . Midline low back pain with left-sided sciatica 12/29/2013  . Musculoskeletal chest pain 05/04/2015  . Obesity   . Precordial pain 05/04/2015  . Primary insomnia 03/14/2015  . Radiculitis of right cervical region 01/20/2018  . Radiculopathy 04/02/2018  . Right hand pain 06/24/2017  . RLS (restless legs syndrome) 07/14/2015  . Type 2 diabetes mellitus with diabetic neuropathy, with long-term current use of insulin ([place]) 02/16/2010  Qualifier: Diagnosis of  By: Koleen Nimrod MD, Dellis Filbert      Assessment: [phone] admitted with CP. In the cath lab, was found to have 3v CAD. Plan for CABG on 5/24. Pharmacy consulted to begin heparin drip 8hr post sheath removal (out 830 TR band placed).  Heparin level remains therapeutic this AM at 0.42.  CBC stable and no active signs of bleeding or line issues per nurse.  Goal of Therapy:  Heparin level 0.3-0.7 units/ml Monitor platelets by anticoagulation protocol: Yes   Plan:  Continue heparin drip rate at 1100 units/hr  Monitor daily heparin level and CBC Monitor for s/sx of bleeding   Kennon Holter, PharmD PGY1  Ambulatory Care Pharmacy Resident 06/13/2019 9:14 AM

## 2019-06-13 NOTE — Progress Notes (Signed)
Progress Note  Patient Name: Tonya Chandler Date of Encounter: 06/13/2019  Primary Cardiologist:   Jenne Campus, MD   Subjective   No chest pain.  No SOB.   Inpatient Medications    Scheduled Meds: . aspirin EC  81 mg Oral Daily  . atorvastatin  40 mg Oral q1800  . cyclobenzaprine  10 mg Oral QHS  . DULoxetine  60 mg Oral Daily  . [START ON 06/14/2019] epinephrine  0-10 mcg/min Intravenous To OR  . estradiol  1 mg Oral QHS  . gabapentin  1,600 mg Oral QHS  . gabapentin  800 mg Oral BID  . [START ON 06/14/2019] heparin-papaverine-plasmalyte irrigation   Irrigation To OR  . [START ON 06/14/2019] insulin   Intravenous To OR  . magnesium oxide  800 mg Oral QHS  . [START ON 06/14/2019] magnesium sulfate  40 mEq Other To OR  . metoprolol tartrate  25 mg Oral BID  . mirtazapine  45 mg Oral QHS  . pantoprazole  40 mg Oral Daily  . [START ON 06/14/2019] phenylephrine  30-200 mcg/min Intravenous To OR  . [START ON 06/14/2019] potassium chloride  80 mEq Other To OR  . rOPINIRole  1.5 mg Oral QHS  . sodium chloride flush  3 mL Intravenous Q12H  . sodium chloride flush  3 mL Intravenous Q12H  . [START ON 06/14/2019] tranexamic acid  15 mg/kg Intravenous To OR  . [START ON 06/14/2019] tranexamic acid  2 mg/kg Intracatheter To OR  . triamterene-hydrochlorothiazide  1 tablet Oral Daily  . vitamin B-12  2,500 mcg Oral Daily   Continuous Infusions: . sodium chloride    . [START ON 06/14/2019] dexmedetomidine    . [START ON 06/14/2019] heparin 30,000 units/NS 1000 mL solution for CELLSAVER    . heparin 1,100 Units/hr (06/12/19 1746)  . [START ON 06/14/2019] levofloxacin (LEVAQUIN) IV    . [START ON 06/14/2019] milrinone    . [START ON 06/14/2019] nitroGLYCERIN    . [START ON 06/14/2019] norepinephrine    . [START ON 06/14/2019] tranexamic acid (CYKLOKAPRON) infusion ([facility])    . [START ON 06/14/2019] vancomycin     PRN Meds: sodium chloride, acetaminophen, famotidine, loteprednol, nitroGLYCERIN,  ondansetron (ZOFRAN) IV, sodium chloride flush   Vital Signs    Vitals:   06/12/19 2039 06/13/19 0037 06/13/19 0113 06/13/19 0545  BP: (!) 141/86 128/79  126/74  Pulse: 79 74  74  Resp: 18 18  18   Temp: 98.5 F (36.9 C) 97.6 F (36.4 C)  97.8 F (36.6 C)  TempSrc: Oral Oral  Oral  SpO2: 93% 95%  94%  Weight:   71.2 kg   Height:        Intake/Output Summary (Last 24 hours) at 06/13/2019 0740 Last data filed at 06/13/2019 0545 Gross per 24 hour  Intake 1520 ml  Output 400 ml  Net 1120 ml   Filed Weights   06/11/19 1159 06/12/19 0557 06/13/19 0113  Weight: 72.1 kg 71.5 kg 71.2 kg    Telemetry    NSR- Personally Reviewed  ECG    NA - Personally Reviewed  Physical Exam   GEN: No  acute distress.   Neck: No  JVD Cardiac: RRR, nomurmurs, rubs, or gallops.  Respiratory: Clear   to auscultation bilaterally. GI: Soft, nontender, non-distended, normal bowel sounds  MS:  No edema; No deformity.  Right ulnar site with slight bruising Neuro:   Nonfocal  Psych: Oriented and appropriate    Labs  Chemistry Recent Labs  Lab 06/12/19 0557  NA 138  K 3.9  CL 99  CO2 27  GLUCOSE 173*  BUN 12  CREATININE 0.68  CALCIUM 9.2  GFRNONAA >60  GFRAA >60  ANIONGAP 12     Hematology Recent Labs  Lab 06/12/19 0557  WBC 10.9*  RBC 5.27*  HGB 15.8*  HCT 48.2*  MCV 91.5  MCH 30.0  MCHC 32.8  RDW 12.6  PLT 291    Cardiac EnzymesNo results for input(s): TROPONINI in the last 168 hours. No results for input(s): TROPIPOC in the last 168 hours.   BNPNo results for input(s): BNP, PROBNP in the last 168 hours.   DDimer No results for input(s): DDIMER in the last 168 hours.   Radiology    CARDIAC CATHETERIZATION  Result Date: 06/11/2019  The left ventricular systolic function is normal.  LV end diastolic pressure is mildly elevated.  The left ventricular ejection fraction is 55-65% by visual estimate.  Prox RCA lesion is 99% stenosed.  Ost LM lesion is 40%  stenosed.  Dist Cx lesion is 100% stenosed.  Prox LAD lesion is 95% stenosed.  Mid LAD lesion is 40% stenosed.  1.  Severe three-vessel coronary artery disease including severe stenosis in the proximal LAD, occluded mid to distal left circumflex with collaterals to the OM's, subtotal occlusion of the proximal right coronary artery with left-to-right collaterals. 2.  Normal LV systolic function mildly elevated left ventricular end-diastolic pressure. Recommendations: Given three-vessel coronary artery disease and diabetic status, recommend CABG. Due to severity of her disease and symptoms with minimal exertion, I recommend admission to the hospital and starting heparin drip. We will consult CTS. I requested an echocardiogram.   ECHOCARDIOGRAM COMPLETE  Result Date: 06/11/2019    ECHOCARDIOGRAM REPORT   Patient Name:   Tonya Chandler  Date of Exam: 06/11/2019 Medical Rec #:  [id number]  Height:       60.0 in Accession #:    [id number] Weight:       157.0 lb Date of Birth:  10-30-1960  BSA:          1.684 m Patient Age:    59 years   BP:           138/79 mmHg Patient Gender: F          HR:           76 bpm. Exam Location:  Inpatient Procedure: 2D Echo and Intracardiac Opacification Agent Indications:    Chest Pain 786.50 / R07.9  History:        Patient has no prior history of Echocardiogram examinations.                 Abnormal ECG; Risk Factors:Hypertension, Diabetes and                 Dyslipidemia. Anemia, GERD.  Sonographer:    Darlina Sicilian RDCS Referring Phys: [place]  1. Left ventricular ejection fraction, by estimation, is 45 to 50%. The left ventricle has mildly decreased function. The left ventricle demonstrates global hypokinesis. There is mild asymmetric left ventricular hypertrophy of the septal segment. Left ventricular diastolic parameters are consistent with Grade I diastolic dysfunction (impaired relaxation).  2. Right ventricular systolic function is normal. The right  ventricular size is normal. There is normal pulmonary artery systolic pressure. The estimated right ventricular systolic pressure is 0000000 mmHg.  3. Left atrial size was mildly dilated.  4. The mitral valve is  normal in structure. Mild mitral valve regurgitation. No evidence of mitral stenosis.  5. The aortic valve is tricuspid. Aortic valve regurgitation is not visualized. No aortic stenosis is present.  6. The inferior vena cava is normal in size with greater than 50% respiratory variability, suggesting right atrial pressure of 3 mmHg. FINDINGS  Left Ventricle: Left ventricular ejection fraction, by estimation, is 45 to 50%. The left ventricle has mildly decreased function. The left ventricle demonstrates global hypokinesis. Definity contrast agent was given IV to delineate the left ventricular  endocardial borders. The left ventricular internal cavity size was normal in size. There is mild asymmetric left ventricular hypertrophy of the septal segment. Left ventricular diastolic parameters are consistent with Grade I diastolic dysfunction (impaired relaxation). Right Ventricle: The right ventricular size is normal. No increase in right ventricular wall thickness. Right ventricular systolic function is normal. There is normal pulmonary artery systolic pressure. The tricuspid regurgitant velocity is 2.06 m/s, and  with an assumed right atrial pressure of 3 mmHg, the estimated right ventricular systolic pressure is 0000000 mmHg. Left Atrium: Left atrial size was mildly dilated. Right Atrium: Right atrial size was normal in size. Pericardium: Trivial pericardial effusion is present. Mitral Valve: The mitral valve is normal in structure. Normal mobility of the mitral valve leaflets. Mild mitral valve regurgitation. No evidence of mitral valve stenosis. Tricuspid Valve: The tricuspid valve is normal in structure. Tricuspid valve regurgitation is mild . No evidence of tricuspid stenosis. Aortic Valve: The aortic valve is  tricuspid. Aortic valve regurgitation is not visualized. No aortic stenosis is present. Pulmonic Valve: The pulmonic valve was normal in structure. Pulmonic valve regurgitation is not visualized. No evidence of pulmonic stenosis. Aorta: The aortic root and ascending aorta are structurally normal, with no evidence of dilitation. Venous: The inferior vena cava is normal in size with greater than 50% respiratory variability, suggesting right atrial pressure of 3 mmHg. IAS/Shunts: No atrial level shunt detected by color flow Doppler.  LEFT VENTRICLE PLAX 2D LVIDd:         4.90 cm      Diastology LVIDs:         3.80 cm      LV e' lateral:   5.33 cm/s LV PW:         0.90 cm      LV E/e' lateral: 14.1 LV IVS:        1.20 cm      LV e' medial:    5.55 cm/s LVOT diam:     1.70 cm      LV E/e' medial:  13.5 LV SV:         34 LV SV Index:   20 LVOT Area:     2.27 cm  LV Volumes (MOD) LV vol d, MOD A2C: 115.0 ml LV vol d, MOD A4C: 142.0 ml LV vol s, MOD A2C: 50.9 ml LV vol s, MOD A4C: 76.6 ml LV SV MOD A2C:     64.1 ml LV SV MOD A4C:     142.0 ml LV SV MOD BP:      66.5 ml RIGHT VENTRICLE RV S prime:     9.36 cm/s TAPSE (M-mode): 2.0 cm LEFT ATRIUM             Index       RIGHT ATRIUM           Index LA diam:        4.30 cm 2.55 cm/m  RA Area:  10.30 cm LA Vol (A2C):   50.8 ml 30.16 ml/m RA Volume:   19.70 ml  11.70 ml/m LA Vol (A4C):   57.5 ml 34.14 ml/m LA Biplane Vol: 54.8 ml 32.54 ml/m  AORTIC VALVE LVOT Vmax:   79.00 cm/s LVOT Vmean:  47.600 cm/s LVOT VTI:    0.150 m  AORTA Ao Root diam: 3.00 cm MITRAL VALVE               TRICUSPID VALVE MV Area (PHT): 5.13 cm    TR Peak grad:   17.0 mmHg MV Decel Time: 148 msec    TR Vmax:        206.00 cm/s MV E velocity: 75.00 cm/s MV A velocity: 81.80 cm/s  SHUNTS MV E/A ratio:  0.92        Systemic VTI:  0.15 m                            Systemic Diam: 1.70 cm Cherlynn Kaiser MD Electronically signed by Cherlynn Kaiser MD Signature Date/Time: 06/11/2019/7:40:37 PM    Final      VAS US DOPPLER PRE CABG  Result Date: 06/12/2019 PREOPERATIVE VASCULAR EVALUATION  Risk Factors:     Hypertension, hyperlipidemia, Diabetes, coronary artery                   disease. Comparison Study: No prior study on file for comparison Performing Technologist: Sharion Dove RVS Supporting Technologist: Maudry Mayhew RDMS, RVT, RDCS  Examination Guidelines: A complete evaluation includes B-mode imaging, spectral Doppler, color Doppler, and power Doppler as needed of all accessible portions of each vessel. Bilateral testing is considered an integral part of a complete examination. Limited examinations for reoccurring indications may be performed as noted.  Right Carotid Findings: +----------+--------+--------+--------+--------+--------+           PSV cm/sEDV cm/sStenosisDescribeComments +----------+--------+--------+--------+--------+--------+ CCA Prox  85      14                               +----------+--------+--------+--------+--------+--------+ CCA Distal71      18                               +----------+--------+--------+--------+--------+--------+ ICA Prox  78      31                               +----------+--------+--------+--------+--------+--------+ ICA Distal42      18                               +----------+--------+--------+--------+--------+--------+ ECA       53      9                                +----------+--------+--------+--------+--------+--------+ Portions of this table do not appear on this page. +----------+--------+-------+--------+------------+           PSV cm/sEDV cmsDescribeArm Pressure +----------+--------+-------+--------+------------+ Subclavian57                                  +----------+--------+-------+--------+------------+ +---------+--------+--+--------+--+ VertebralPSV cm/s34EDV cm/s10 +---------+--------+--+--------+--+ Left  Carotid Findings:  +----------+--------+--------+--------+--------+--------+           PSV cm/sEDV cm/sStenosisDescribeComments +----------+--------+--------+--------+--------+--------+ CCA Prox  60      16                               +----------+--------+--------+--------+--------+--------+ CCA Distal50      16                               +----------+--------+--------+--------+--------+--------+ ICA Prox  48      22                               +----------+--------+--------+--------+--------+--------+ ICA Distal52      22                               +----------+--------+--------+--------+--------+--------+ ECA       54      11                               +----------+--------+--------+--------+--------+--------+ +----------+--------+--------+--------+------------+ SubclavianPSV cm/sEDV cm/sDescribeArm Pressure +----------+--------+--------+--------+------------+           61                                   +----------+--------+--------+--------+------------+ +---------+--------+--+--------+--+ VertebralPSV cm/s37EDV cm/s12 +---------+--------+--+--------+--+  ABI Findings: +--------+------------------+-----+---------+--------+ Right   Rt Pressure (mmHg)IndexWaveform Comment  +--------+------------------+-----+---------+--------+ [id number]                    triphasic         +--------+------------------+-----+---------+--------+ PTA     164               1.10 triphasic         +--------+------------------+-----+---------+--------+ DP      161               1.08 triphasic         +--------+------------------+-----+---------+--------+ +--------+------------------+-----+---------+-------+ Left    Lt Pressure (mmHg)IndexWaveform Comment +--------+------------------+-----+---------+-------+ [id number]                    triphasic        +--------+------------------+-----+---------+-------+ PTA     167               1.12  triphasic        +--------+------------------+-----+---------+-------+ DP      166               1.11 triphasic        +--------+------------------+-----+---------+-------+  Right Doppler Findings: +--------+--------+-----+---------+--------+ Site    PressureIndexDoppler  Comments +--------+--------+-----+---------+--------+ [id number]          triphasic         +--------+--------+-----+---------+--------+ Radial               triphasic         +--------+--------+-----+---------+--------+ Ulnar                triphasic         +--------+--------+-----+---------+--------+  Left Doppler Findings: +--------+--------+-----+---------+-------------------------+ Site    PressureIndexDoppler  Comments                  +--------+--------+-----+---------+-------------------------+  [phone]          triphasic                          +--------+--------+-----+---------+-------------------------+ Radial               triphasic                          +--------+--------+-----+---------+-------------------------+ Ulnar                triphasicreverses with compression +--------+--------+-----+---------+-------------------------+  Summary: Right Carotid: The extracranial vessels were near-normal with only minimal wall                thickening or plaque. Left Carotid: The extracranial vessels were near-normal with only minimal wall               thickening or plaque. Vertebrals:  Bilateral vertebral arteries demonstrate antegrade flow. Subclavians: Normal flow hemodynamics were seen in bilateral subclavian              arteries. Right ABI: Resting right ankle-brachial index is within normal range. No evidence of significant right lower extremity arterial disease. Left ABI: Resting left ankle-brachial index is within normal range. No evidence of significant left lower extremity arterial disease. Right Upper Extremity: Doppler waveforms remain within normal limits with  right radial compression. Doppler waveforms remain within normal limits with right ulnar compression. Left Upper Extremity: Doppler waveforms remain within normal limits with left radial compression. Doppler waveforms decrease <50% with left ulnar compression.  Electronically signed by Servando Snare MD on 06/12/2019 at 3:18:13 PM.    Final     Cardiac Studies   Diagnostic cath Dominance: Co-dominant   Echo:    1. Left ventricular ejection fraction, by estimation, is 45 to 50%. The  left ventricle has mildly decreased function. The left ventricle  demonstrates global hypokinesis. There is mild asymmetric left ventricular  hypertrophy of the septal segment. Left  ventricular diastolic parameters are consistent with Grade I diastolic  dysfunction (impaired relaxation).  2. Right ventricular systolic function is normal. The right ventricular  size is normal. There is normal pulmonary artery systolic pressure. The  estimated right ventricular systolic pressure is 0000000 mmHg.  3. Left atrial size was mildly dilated.  4. The mitral valve is normal in structure. Mild mitral valve  regurgitation. No evidence of mitral stenosis.  5. The aortic valve is tricuspid. Aortic valve regurgitation is not  visualized. No aortic stenosis is present.  6. The inferior vena cava is normal in size with greater than 50%  respiratory variability, suggesting right atrial pressure of 3 mmHg.  Patient Profile     59 y.o. female who was seen for the evaluation of chest pain at the request of Emeterio Reeve, DO.  Past medical history significant for diabetes mellitus type 2, dyslipidemia, hypertension, coronary artery disease.  She   Assessment & Plan    CAD: Three vessel for CABG.  PFTs with minimal obstructive disease.   Vascular Dopplers with minimal carotid plaque.  Noted mild asymmetric septal hypertrophy on echo.  Order CXR per protocol preop   DM:    A1C 9.2.   Invokana on hold for surgery.  I will  order SSI.   HTN:   BP OK.    DYSLIPIDEMIA:  Continue moderate dose statin.    For questions or updates, please contact [facility] Please consult [email or web address] for contact info under  Cardiology/STEMI.   Signed, Minus Breeding, MD  06/13/2019, 7:40 AM

## 2019-06-14 ENCOUNTER — Inpatient Hospital Stay (HOSPITAL_COMMUNITY): Payer: BC Managed Care – PPO | Admitting: Certified Registered Nurse Anesthetist

## 2019-06-14 ENCOUNTER — Inpatient Hospital Stay (HOSPITAL_COMMUNITY)
Admission: AD | Disposition: A | Discharge status: Home / Self Care | Payer: Self-pay | Source: Ambulatory Visit | Attending: Cardiothoracic Surgery

## 2019-06-14 ENCOUNTER — Inpatient Hospital Stay (HOSPITAL_COMMUNITY): Payer: BC Managed Care – PPO

## 2019-06-14 ENCOUNTER — Encounter (HOSPITAL_COMMUNITY): Payer: Self-pay | Admitting: Cardiovascular Disease

## 2019-06-14 DIAGNOSIS — J939 Pneumothorax, unspecified: Secondary | ICD-10-CM | POA: Diagnosis not present

## 2019-06-14 DIAGNOSIS — I25110 Atherosclerotic heart disease of native coronary artery with unstable angina pectoris: Secondary | ICD-10-CM | POA: Diagnosis not present

## 2019-06-14 DIAGNOSIS — I340 Nonrheumatic mitral (valve) insufficiency: Secondary | ICD-10-CM | POA: Diagnosis not present

## 2019-06-14 DIAGNOSIS — E785 Hyperlipidemia, unspecified: Secondary | ICD-10-CM | POA: Diagnosis not present

## 2019-06-14 DIAGNOSIS — E1140 Type 2 diabetes mellitus with diabetic neuropathy, unspecified: Secondary | ICD-10-CM | POA: Diagnosis not present

## 2019-06-14 DIAGNOSIS — J9811 Atelectasis: Secondary | ICD-10-CM | POA: Diagnosis not present

## 2019-06-14 DIAGNOSIS — I10 Essential (primary) hypertension: Secondary | ICD-10-CM | POA: Diagnosis not present

## 2019-06-14 DIAGNOSIS — E1165 Type 2 diabetes mellitus with hyperglycemia: Secondary | ICD-10-CM | POA: Diagnosis not present

## 2019-06-14 DIAGNOSIS — Z20822 Contact with and (suspected) exposure to covid-19: Secondary | ICD-10-CM | POA: Diagnosis not present

## 2019-06-14 HISTORY — PX: CORONARY ARTERY BYPASS GRAFT: SHX141

## 2019-06-14 HISTORY — PX: TEE WITHOUT CARDIOVERSION: SHX5443

## 2019-06-14 HISTORY — PX: ENDOVEIN HARVEST OF GREATER SAPHENOUS VEIN: SHX5059

## 2019-06-14 LAB — POCT I-STAT 7, (LYTES, BLD GAS, ICA,H+H)
Acid-Base Excess: 2 mmol/L (ref 0.0–2.0)
Acid-Base Excess: 1 mmol/L (ref 0.0–2.0)
Acid-Base Excess: 2 mmol/L (ref 0.0–2.0)
Acid-Base Excess: 0 mmol/L (ref 0.0–2.0)
Acid-Base Excess: 1 mmol/L (ref 0.0–2.0)
Bicarbonate: 27.8 mmol/L (ref 20.0–28.0)
Bicarbonate: 27.3 mmol/L (ref 20.0–28.0)
Bicarbonate: 28.6 mmol/L — ABNORMAL HIGH (ref 20.0–28.0)
Bicarbonate: 26.9 mmol/L (ref 20.0–28.0)
Bicarbonate: 27.7 mmol/L (ref 20.0–28.0)
Calcium, Ion: 1.09 mmol/L — ABNORMAL LOW (ref 1.15–1.40)
Calcium, Ion: 1.08 mmol/L — ABNORMAL LOW (ref 1.15–1.40)
Calcium, Ion: 1.1 mmol/L — ABNORMAL LOW (ref 1.15–1.40)
Calcium, Ion: 1.09 mmol/L — ABNORMAL LOW (ref 1.15–1.40)
Calcium, Ion: 1.11 mmol/L — ABNORMAL LOW (ref 1.15–1.40)
HCT: 33 % — ABNORMAL LOW (ref 36.0–46.0)
HCT: 32 % — ABNORMAL LOW (ref 36.0–46.0)
HCT: 33 % — ABNORMAL LOW (ref 36.0–46.0)
HCT: 33 % — ABNORMAL LOW (ref 36.0–46.0)
HCT: 33 % — ABNORMAL LOW (ref 36.0–46.0)
Hemoglobin: 11.2 g/dL — ABNORMAL LOW (ref 12.0–15.0)
Hemoglobin: 10.9 g/dL — ABNORMAL LOW (ref 12.0–15.0)
Hemoglobin: 11.2 g/dL — ABNORMAL LOW (ref 12.0–15.0)
Hemoglobin: 11.2 g/dL — ABNORMAL LOW (ref 12.0–15.0)
Hemoglobin: 11.2 g/dL — ABNORMAL LOW (ref 12.0–15.0)
O2 Saturation: 96 %
O2 Saturation: 98 %
O2 Saturation: 95 %
O2 Saturation: 94 %
O2 Saturation: 96 %
Patient temperature: 37.3
Patient temperature: 37.6
Patient temperature: 37.5
Patient temperature: 37.4
Patient temperature: 37.3
Potassium: 3.2 mmol/L — ABNORMAL LOW (ref 3.5–5.1)
Potassium: 4.4 mmol/L (ref 3.5–5.1)
Potassium: 4.6 mmol/L (ref 3.5–5.1)
Potassium: 4.5 mmol/L (ref 3.5–5.1)
Potassium: 4.4 mmol/L (ref 3.5–5.1)
Sodium: 140 mmol/L (ref 135–145)
Sodium: 139 mmol/L (ref 135–145)
Sodium: 140 mmol/L (ref 135–145)
Sodium: 140 mmol/L (ref 135–145)
Sodium: 140 mmol/L (ref 135–145)
TCO2: 29 mmol/L (ref 22–32)
TCO2: 29 mmol/L (ref 22–32)
TCO2: 30 mmol/L (ref 22–32)
TCO2: 28 mmol/L (ref 22–32)
TCO2: 29 mmol/L (ref 22–32)
pCO2 arterial: 50.7 mmHg — ABNORMAL HIGH (ref 32.0–48.0)
pCO2 arterial: 49.9 mmHg — ABNORMAL HIGH (ref 32.0–48.0)
pCO2 arterial: 57.2 mmHg — ABNORMAL HIGH (ref 32.0–48.0)
pCO2 arterial: 52.9 mmHg — ABNORMAL HIGH (ref 32.0–48.0)
pCO2 arterial: 52.7 mmHg — ABNORMAL HIGH (ref 32.0–48.0)
pH, Arterial: 7.349 — ABNORMAL LOW (ref 7.350–7.450)
pH, Arterial: 7.349 — ABNORMAL LOW (ref 7.350–7.450)
pH, Arterial: 7.31 — ABNORMAL LOW (ref 7.350–7.450)
pH, Arterial: 7.317 — ABNORMAL LOW (ref 7.350–7.450)
pH, Arterial: 7.33 — ABNORMAL LOW (ref 7.350–7.450)
pO2, Arterial: 89 mmHg (ref 83.0–108.0)
pO2, Arterial: 108 mmHg (ref 83.0–108.0)
pO2, Arterial: 84 mmHg (ref 83.0–108.0)
pO2, Arterial: 79 mmHg — ABNORMAL LOW (ref 83.0–108.0)
pO2, Arterial: 93 mmHg (ref 83.0–108.0)

## 2019-06-14 LAB — ECHO INTRAOPERATIVE TEE
Height: 60 in
Weight: 2472 oz

## 2019-06-14 LAB — COMPREHENSIVE METABOLIC PANEL
ALT: 13 U/L (ref 0–44)
AST: 32 U/L (ref 15–41)
Albumin: 2.5 g/dL — ABNORMAL LOW (ref 3.5–5.0)
Alkaline Phosphatase: 57 U/L (ref 38–126)
Anion gap: 6 (ref 5–15)
BUN: 10 mg/dL (ref 6–20)
CO2: 28 mmol/L (ref 22–32)
Calcium: 6.9 mg/dL — ABNORMAL LOW (ref 8.9–10.3)
Chloride: 104 mmol/L (ref 98–111)
Creatinine, Ser: 0.44 mg/dL (ref 0.44–1.00)
GFR calc Af Amer: >60 mL/min (ref >60)
GFR calc non Af Amer: >60 mL/min (ref >60)
Glucose, Bld: 152 mg/dL — ABNORMAL HIGH (ref 70–99)
Potassium: 3.3 mmol/L — ABNORMAL LOW (ref 3.5–5.1)
Sodium: 138 mmol/L (ref 135–145)
Total Bilirubin: 0.9 mg/dL (ref 0.3–1.2)
Total Protein: 4.5 g/dL — ABNORMAL LOW (ref 6.5–8.1)

## 2019-06-14 LAB — APTT: aPTT: 27 seconds (ref 24–36)

## 2019-06-14 LAB — CBC
HCT: 35.5 % — ABNORMAL LOW (ref 36.0–46.0)
HCT: 35.5 % — ABNORMAL LOW (ref 36.0–46.0)
Hemoglobin: 11.9 g/dL — ABNORMAL LOW (ref 12.0–15.0)
Hemoglobin: 11.8 g/dL — ABNORMAL LOW (ref 12.0–15.0)
MCH: 30.1 pg (ref 26.0–34.0)
MCH: 29.9 pg (ref 26.0–34.0)
MCHC: 33.5 g/dL (ref 30.0–36.0)
MCHC: 33.2 g/dL (ref 30.0–36.0)
MCV: 89.9 fL (ref 80.0–100.0)
MCV: 90.1 fL (ref 80.0–100.0)
Platelets: 219 10*3/uL (ref 150–400)
Platelets: 229 10*3/uL (ref 150–400)
RBC: 3.95 MIL/uL (ref 3.87–5.11)
RBC: 3.94 MIL/uL (ref 3.87–5.11)
RDW: 12.2 % (ref 11.5–15.5)
RDW: 12.4 % (ref 11.5–15.5)
WBC: 14.7 10*3/uL — ABNORMAL HIGH (ref 4.0–10.5)
WBC: 12.7 10*3/uL — ABNORMAL HIGH (ref 4.0–10.5)
nRBC: 0 % (ref 0.0–0.2)
nRBC: 0 % (ref 0.0–0.2)

## 2019-06-14 LAB — GLUCOSE, CAPILLARY
Glucose-Capillary: 134 mg/dL — ABNORMAL HIGH (ref 70–99)
Glucose-Capillary: 136 mg/dL — ABNORMAL HIGH (ref 70–99)
Glucose-Capillary: 139 mg/dL — ABNORMAL HIGH (ref 70–99)
Glucose-Capillary: 145 mg/dL — ABNORMAL HIGH (ref 70–99)
Glucose-Capillary: 147 mg/dL — ABNORMAL HIGH (ref 70–99)
Glucose-Capillary: 149 mg/dL — ABNORMAL HIGH (ref 70–99)
Glucose-Capillary: 152 mg/dL — ABNORMAL HIGH (ref 70–99)
Glucose-Capillary: 152 mg/dL — ABNORMAL HIGH (ref 70–99)
Glucose-Capillary: 155 mg/dL — ABNORMAL HIGH (ref 70–99)
Glucose-Capillary: 162 mg/dL — ABNORMAL HIGH (ref 70–99)
Glucose-Capillary: 185 mg/dL — ABNORMAL HIGH (ref 70–99)
Glucose-Capillary: 306 mg/dL — ABNORMAL HIGH (ref 70–99)

## 2019-06-14 LAB — BASIC METABOLIC PANEL
Anion gap: 6 (ref 5–15)
BUN: 8 mg/dL (ref 6–20)
CO2: 25 mmol/L (ref 22–32)
Calcium: 7.3 mg/dL — ABNORMAL LOW (ref 8.9–10.3)
Chloride: 108 mmol/L (ref 98–111)
Creatinine, Ser: 0.44 mg/dL (ref 0.44–1.00)
GFR calc Af Amer: >60 mL/min (ref >60)
GFR calc non Af Amer: >60 mL/min (ref >60)
Glucose, Bld: 159 mg/dL — ABNORMAL HIGH (ref 70–99)
Potassium: 4.5 mmol/L (ref 3.5–5.1)
Sodium: 139 mmol/L (ref 135–145)

## 2019-06-14 LAB — HEMOGLOBIN AND HEMATOCRIT, BLOOD
HCT: 28.5 % — ABNORMAL LOW (ref 36.0–46.0)
Hemoglobin: 9.9 g/dL — ABNORMAL LOW (ref 12.0–15.0)

## 2019-06-14 LAB — MAGNESIUM: Magnesium: 2.5 mg/dL — ABNORMAL HIGH (ref 1.7–2.4)

## 2019-06-14 LAB — PROTIME-INR
INR: 1.2 (ref 0.8–1.2)
Prothrombin Time: 15 seconds (ref 11.4–15.2)

## 2019-06-14 LAB — PREPARE RBC (CROSSMATCH)

## 2019-06-14 LAB — PLATELET COUNT: Platelets: 256 10*3/uL (ref 150–400)

## 2019-06-14 SURGERY — CORONARY ARTERY BYPASS GRAFTING (CABG)
Anesthesia: General | Site: Leg Upper | Laterality: Right

## 2019-06-14 MED ORDER — SODIUM CHLORIDE 0.9% FLUSH: 3.0000 mL | INTRAVENOUS | Status: DC | PRN

## 2019-06-14 MED ORDER — MORPHINE SULFATE (PF) 2 MG/ML IV SOLN
1.0000 mg | INTRAVENOUS | Status: DC | PRN
  Administered 2019-06-14 (×2): 4 mg via INTRAVENOUS
  Administered 2019-06-14: 2 mg via INTRAVENOUS
  Administered 2019-06-14 – 2019-06-15 (×2): 4 mg via INTRAVENOUS
  Filled 2019-06-14: qty 2
  Filled 2019-06-14: qty 1
  Filled 2019-06-14 (×3): qty 2

## 2019-06-14 MED ORDER — LACTATED RINGERS IV SOLN: INTRAVENOUS | Status: DC | PRN

## 2019-06-14 MED ORDER — ROCURONIUM BROMIDE 10 MG/ML (PF) SYRINGE
PREFILLED_SYRINGE | INTRAVENOUS | Status: AC
  Filled 2019-06-14: qty 10

## 2019-06-14 MED ORDER — ACETAMINOPHEN 650 MG RE SUPP
650.0000 mg | Freq: Once | RECTAL | Status: AC
  Administered 2019-06-14: 650 mg via RECTAL

## 2019-06-14 MED ORDER — SODIUM CHLORIDE 0.9 % IV SOLN: 250.0000 mL | INTRAVENOUS | Status: DC

## 2019-06-14 MED ORDER — HEPARIN SODIUM (PORCINE) 1000 UNIT/ML IJ SOLN
INTRAMUSCULAR | Status: DC | PRN
  Administered 2019-06-14: 24000 [IU] via INTRAVENOUS

## 2019-06-14 MED ORDER — INSULIN REGULAR(HUMAN) IN NACL 100-0.9 UT/100ML-% IV SOLN
INTRAVENOUS | Status: DC
  Administered 2019-06-15: 2.8 [IU]/h via INTRAVENOUS
  Filled 2019-06-14: qty 100

## 2019-06-14 MED ORDER — PHENYLEPHRINE 40 MCG/ML (10ML) SYRINGE FOR IV PUSH (FOR BLOOD PRESSURE SUPPORT)
PREFILLED_SYRINGE | INTRAVENOUS | Status: AC
  Filled 2019-06-14: qty 10

## 2019-06-14 MED ORDER — MIDAZOLAM HCL (PF) 10 MG/2ML IJ SOLN
INTRAMUSCULAR | Status: AC
  Filled 2019-06-14: qty 2

## 2019-06-14 MED ORDER — SODIUM CHLORIDE 0.9 % IV SOLN: INTRAVENOUS | Status: DC

## 2019-06-14 MED ORDER — ACETAMINOPHEN 500 MG PO TABS
1000.0000 mg | ORAL_TABLET | Freq: Four times a day (QID) | ORAL | Status: DC
  Administered 2019-06-14 – 2019-06-16 (×6): 1000 mg via ORAL
  Filled 2019-06-14 (×6): qty 2

## 2019-06-14 MED ORDER — DEXMEDETOMIDINE HCL IN NACL 400 MCG/100ML IV SOLN: 0.0000 ug/kg/h | INTRAVENOUS | Status: DC

## 2019-06-14 MED ORDER — ROCURONIUM BROMIDE 10 MG/ML (PF) SYRINGE
PREFILLED_SYRINGE | INTRAVENOUS | Status: DC | PRN
  Administered 2019-06-14: 70 mg via INTRAVENOUS
  Administered 2019-06-14: 50 mg via INTRAVENOUS
  Administered 2019-06-14: 60 mg via INTRAVENOUS
  Administered 2019-06-14 (×2): 50 mg via INTRAVENOUS
  Administered 2019-06-14: 80 mg via INTRAVENOUS

## 2019-06-14 MED ORDER — DOCUSATE SODIUM 100 MG PO CAPS
200.0000 mg | ORAL_CAPSULE | Freq: Every day | ORAL | Status: DC
  Administered 2019-06-15: 200 mg via ORAL
  Filled 2019-06-14 (×2): qty 2

## 2019-06-14 MED ORDER — 0.9 % SODIUM CHLORIDE (POUR BTL) OPTIME
TOPICAL | Status: DC | PRN
  Administered 2019-06-14: 5000 mL
  Administered 2019-06-14: 1000 mL

## 2019-06-14 MED ORDER — LEVOFLOXACIN IN D5W 750 MG/150ML IV SOLN
750.0000 mg | INTRAVENOUS | Status: AC
  Administered 2019-06-15: 750 mg via INTRAVENOUS
  Filled 2019-06-14: qty 150

## 2019-06-14 MED ORDER — SODIUM CHLORIDE 0.9% IV SOLUTION: Freq: Once | INTRAVENOUS | Status: DC

## 2019-06-14 MED ORDER — FENTANYL CITRATE (PF) 250 MCG/5ML IJ SOLN
INTRAMUSCULAR | Status: AC
  Filled 2019-06-14: qty 5

## 2019-06-14 MED ORDER — ACETAMINOPHEN 160 MG/5ML PO SOLN: 1000.0000 mg | Freq: Four times a day (QID) | ORAL | Status: DC

## 2019-06-14 MED ORDER — PROTAMINE SULFATE 10 MG/ML IV SOLN
INTRAVENOUS | Status: AC
  Filled 2019-06-14: qty 25

## 2019-06-14 MED ORDER — ALBUMIN HUMAN 5 % IV SOLN
INTRAVENOUS | Status: DC | PRN
Start: 2019-06-14 — End: 2019-06-14

## 2019-06-14 MED ORDER — BISACODYL 10 MG RE SUPP: 10.0000 mg | Freq: Every day | RECTAL | Status: DC

## 2019-06-14 MED ORDER — CHLORHEXIDINE GLUCONATE 0.12 % MT SOLN
15.0000 mL | OROMUCOSAL | Status: AC
  Administered 2019-06-14: 15 mL via OROMUCOSAL

## 2019-06-14 MED ORDER — PROPOFOL 10 MG/ML IV BOLUS
INTRAVENOUS | Status: DC | PRN
  Administered 2019-06-14: 50 mg via INTRAVENOUS
  Administered 2019-06-14: 30 mg via INTRAVENOUS
  Administered 2019-06-14: 20 mg via INTRAVENOUS

## 2019-06-14 MED ORDER — ARTIFICIAL TEARS OPHTHALMIC OINT
TOPICAL_OINTMENT | OPHTHALMIC | Status: AC
  Filled 2019-06-14: qty 3.5

## 2019-06-14 MED ORDER — MAGNESIUM SULFATE 4 GM/100ML IV SOLN
4.0000 g | Freq: Once | INTRAVENOUS | Status: AC
  Administered 2019-06-14: 4 g via INTRAVENOUS
  Filled 2019-06-14: qty 100

## 2019-06-14 MED ORDER — METOPROLOL TARTRATE 25 MG/10 ML ORAL SUSPENSION: 12.5000 mg | Freq: Two times a day (BID) | ORAL | Status: DC

## 2019-06-14 MED ORDER — VANCOMYCIN HCL IN DEXTROSE 1-5 GM/200ML-% IV SOLN
1000.0000 mg | Freq: Once | INTRAVENOUS | Status: AC
  Administered 2019-06-14: 1000 mg via INTRAVENOUS
  Filled 2019-06-14: qty 200

## 2019-06-14 MED ORDER — DEXTROSE 50 % IV SOLN: 0.0000 mL | INTRAVENOUS | Status: DC | PRN

## 2019-06-14 MED ORDER — METOPROLOL TARTRATE 5 MG/5ML IV SOLN: 2.5000 mg | INTRAVENOUS | Status: DC | PRN

## 2019-06-14 MED ORDER — ONDANSETRON HCL 4 MG/2ML IJ SOLN
4.0000 mg | Freq: Four times a day (QID) | INTRAMUSCULAR | Status: DC | PRN
  Administered 2019-06-15 (×3): 4 mg via INTRAVENOUS
  Filled 2019-06-14 (×3): qty 2

## 2019-06-14 MED ORDER — LACTATED RINGERS IV SOLN: INTRAVENOUS | Status: DC

## 2019-06-14 MED ORDER — PROTAMINE SULFATE 10 MG/ML IV SOLN
INTRAVENOUS | Status: DC | PRN
  Administered 2019-06-14: 100 mg via INTRAVENOUS
  Administered 2019-06-14: 40 mg via INTRAVENOUS
  Administered 2019-06-14: 100 mg via INTRAVENOUS

## 2019-06-14 MED ORDER — BISACODYL 5 MG PO TBEC
10.0000 mg | DELAYED_RELEASE_TABLET | Freq: Every day | ORAL | Status: DC
  Administered 2019-06-15: 10 mg via ORAL
  Filled 2019-06-14 (×2): qty 2

## 2019-06-14 MED ORDER — SODIUM CHLORIDE 0.9% FLUSH
3.0000 mL | Freq: Two times a day (BID) | INTRAVENOUS | Status: DC
  Administered 2019-06-15: 3 mL via INTRAVENOUS

## 2019-06-14 MED ORDER — SODIUM CHLORIDE 0.45 % IV SOLN: INTRAVENOUS | Status: DC | PRN

## 2019-06-14 MED ORDER — PHENYLEPHRINE 40 MCG/ML (10ML) SYRINGE FOR IV PUSH (FOR BLOOD PRESSURE SUPPORT)
PREFILLED_SYRINGE | INTRAVENOUS | Status: DC | PRN
  Administered 2019-06-14 (×2): 80 ug via INTRAVENOUS

## 2019-06-14 MED ORDER — CHLORHEXIDINE GLUCONATE CLOTH 2 % EX PADS
6.0000 | MEDICATED_PAD | Freq: Every day | CUTANEOUS | Status: DC
  Administered 2019-06-15: 6 via TOPICAL

## 2019-06-14 MED ORDER — ARTIFICIAL TEARS OPHTHALMIC OINT
TOPICAL_OINTMENT | OPHTHALMIC | Status: DC | PRN
  Administered 2019-06-14: 1 via OPHTHALMIC

## 2019-06-14 MED ORDER — OXYCODONE HCL 5 MG PO TABS
5.0000 mg | ORAL_TABLET | ORAL | Status: DC | PRN
  Administered 2019-06-14 – 2019-06-16 (×4): 10 mg via ORAL
  Filled 2019-06-14 (×4): qty 2

## 2019-06-14 MED ORDER — LACTATED RINGERS IV SOLN: 500.0000 mL | Freq: Once | INTRAVENOUS | Status: DC | PRN

## 2019-06-14 MED ORDER — SODIUM CHLORIDE (PF) 0.9 % IJ SOLN
INTRAMUSCULAR | Status: AC
  Filled 2019-06-14: qty 10

## 2019-06-14 MED ORDER — MIDAZOLAM HCL 2 MG/2ML IJ SOLN: 2.0000 mg | INTRAMUSCULAR | Status: DC | PRN

## 2019-06-14 MED ORDER — HEMOSTATIC AGENTS (NO CHARGE) OPTIME
TOPICAL | Status: DC | PRN
  Administered 2019-06-14 (×5): 1 via TOPICAL

## 2019-06-14 MED ORDER — POTASSIUM CHLORIDE 10 MEQ/50ML IV SOLN
10.0000 meq | INTRAVENOUS | Status: AC
  Administered 2019-06-14 (×3): 10 meq via INTRAVENOUS

## 2019-06-14 MED ORDER — MIDAZOLAM HCL 5 MG/5ML IJ SOLN
INTRAMUSCULAR | Status: DC | PRN
  Administered 2019-06-14: 1 mg via INTRAVENOUS
  Administered 2019-06-14 (×2): 2 mg via INTRAVENOUS
  Administered 2019-06-14: 1 mg via INTRAVENOUS

## 2019-06-14 MED ORDER — FENTANYL CITRATE (PF) 250 MCG/5ML IJ SOLN
INTRAMUSCULAR | Status: AC
  Filled 2019-06-14: qty 25

## 2019-06-14 MED ORDER — LIDOCAINE 2% (20 MG/ML) 5 ML SYRINGE
INTRAMUSCULAR | Status: AC
  Filled 2019-06-14: qty 5

## 2019-06-14 MED ORDER — ALBUMIN HUMAN 5 % IV SOLN
250.0000 mL | INTRAVENOUS | Status: AC | PRN
  Administered 2019-06-14: 12.5 g via INTRAVENOUS

## 2019-06-14 MED ORDER — SODIUM CHLORIDE 0.9 % IV SOLN: INTRAVENOUS | Status: DC | PRN

## 2019-06-14 MED ORDER — NITROGLYCERIN IN D5W 200-5 MCG/ML-% IV SOLN: 0.0000 ug/min | INTRAVENOUS | Status: DC

## 2019-06-14 MED ORDER — EPHEDRINE 5 MG/ML INJ
INTRAVENOUS | Status: AC
  Filled 2019-06-14: qty 10

## 2019-06-14 MED ORDER — ASPIRIN 81 MG PO CHEW: 324.0000 mg | CHEWABLE_TABLET | Freq: Every day | ORAL | Status: DC

## 2019-06-14 MED ORDER — ASPIRIN EC 325 MG PO TBEC
325.0000 mg | DELAYED_RELEASE_TABLET | Freq: Every day | ORAL | Status: DC
  Administered 2019-06-15: 325 mg via ORAL
  Filled 2019-06-14 (×2): qty 1

## 2019-06-14 MED ORDER — PHENYLEPHRINE HCL-NACL 20-0.9 MG/250ML-% IV SOLN: 0.0000 ug/min | INTRAVENOUS | Status: DC

## 2019-06-14 MED ORDER — PANTOPRAZOLE SODIUM 40 MG PO TBEC: 40.0000 mg | DELAYED_RELEASE_TABLET | Freq: Every day | ORAL | Status: DC

## 2019-06-14 MED ORDER — TRAMADOL HCL 50 MG PO TABS
50.0000 mg | ORAL_TABLET | ORAL | Status: DC | PRN
  Administered 2019-06-14 – 2019-06-15 (×3): 100 mg via ORAL
  Filled 2019-06-14 (×3): qty 2

## 2019-06-14 MED ORDER — METOPROLOL TARTRATE 12.5 MG HALF TABLET
12.5000 mg | ORAL_TABLET | Freq: Two times a day (BID) | ORAL | Status: DC
  Administered 2019-06-14 – 2019-06-15 (×3): 12.5 mg via ORAL
  Filled 2019-06-14 (×4): qty 1

## 2019-06-14 MED ORDER — FAMOTIDINE IN NACL 20-0.9 MG/50ML-% IV SOLN
20.0000 mg | Freq: Two times a day (BID) | INTRAVENOUS | Status: AC
  Administered 2019-06-14 (×2): 20 mg via INTRAVENOUS
  Filled 2019-06-14 (×2): qty 50

## 2019-06-14 MED ORDER — PROPOFOL 10 MG/ML IV BOLUS
INTRAVENOUS | Status: AC
  Filled 2019-06-14: qty 20

## 2019-06-14 MED ORDER — FENTANYL CITRATE (PF) 250 MCG/5ML IJ SOLN
INTRAMUSCULAR | Status: DC | PRN
  Administered 2019-06-14: 100 ug via INTRAVENOUS
  Administered 2019-06-14: 50 ug via INTRAVENOUS
  Administered 2019-06-14 (×2): 150 ug via INTRAVENOUS
  Administered 2019-06-14: 50 ug via INTRAVENOUS
  Administered 2019-06-14: 100 ug via INTRAVENOUS
  Administered 2019-06-14: 750 ug via INTRAVENOUS
  Administered 2019-06-14: 100 ug via INTRAVENOUS
  Administered 2019-06-14: 50 ug via INTRAVENOUS

## 2019-06-14 MED ORDER — PHENYLEPHRINE HCL-NACL 10-0.9 MG/250ML-% IV SOLN
INTRAVENOUS | Status: DC | PRN
  Administered 2019-06-14: 30 ug/min via INTRAVENOUS

## 2019-06-14 MED ORDER — HEPARIN SODIUM (PORCINE) 1000 UNIT/ML IJ SOLN
INTRAMUSCULAR | Status: AC
  Filled 2019-06-14: qty 1

## 2019-06-14 MED ORDER — NITROGLYCERIN 0.2 MG/ML ON CALL CATH LAB
INTRAVENOUS | Status: DC | PRN
  Administered 2019-06-14 (×2): 40 ug via INTRAVENOUS

## 2019-06-14 MED ORDER — PLASMA-LYTE 148 IV SOLN
INTRAVENOUS | Status: DC | PRN
  Administered 2019-06-14: 500 mL

## 2019-06-14 MED ORDER — ACETAMINOPHEN 160 MG/5ML PO SOLN: 650.0000 mg | Freq: Once | ORAL | Status: AC

## 2019-06-14 SURGICAL SUPPLY — 84 items
APPLICATOR COTTON TIP 6 STRL (MISCELLANEOUS) IMPLANT
APPLICATOR COTTON TIP 6IN STRL (MISCELLANEOUS) ×4
BAG DECANTER FOR FLEXI CONT (MISCELLANEOUS) ×4 IMPLANT
BLADE CLIPPER SURG (BLADE) ×4 IMPLANT
BLADE STERNUM SYSTEM 6 (BLADE) ×4 IMPLANT
BLADE SURG 11 STRL SS (BLADE) ×1 IMPLANT
BNDG ELASTIC 4X5.8 VLCR STR LF (GAUZE/BANDAGES/DRESSINGS) ×4 IMPLANT
BNDG ELASTIC 6X5.8 VLCR STR LF (GAUZE/BANDAGES/DRESSINGS) ×4 IMPLANT
BNDG GAUZE ELAST 4 BULKY (GAUZE/BANDAGES/DRESSINGS) ×4 IMPLANT
CANISTER SUCT 3000ML PPV (MISCELLANEOUS) ×4 IMPLANT
CANNULA VEN 2 STAGE (MISCELLANEOUS) ×1 IMPLANT
CATH CPB KIT GERHARDT (MISCELLANEOUS) ×4 IMPLANT
CATH THORACIC 28FR (CATHETERS) ×4 IMPLANT
CLIP VESOCCLUDE SM WIDE 24/CT (CLIP) ×1 IMPLANT
DERMABOND ADVANCED (GAUZE/BANDAGES/DRESSINGS) ×1
DERMABOND ADVANCED .7 DNX12 (GAUZE/BANDAGES/DRESSINGS) IMPLANT
DRAIN CHANNEL 28F RND 3/8 FF (WOUND CARE) ×4 IMPLANT
DRAPE CARDIOVASCULAR INCISE (DRAPES) ×1
DRAPE SLUSH/WARMER DISC (DRAPES) ×4 IMPLANT
DRAPE SRG 135X102X78XABS (DRAPES) ×3 IMPLANT
DRSG AQUACEL AG ADV 3.5X14 (GAUZE/BANDAGES/DRESSINGS) ×4 IMPLANT
ELECT BLADE 4.0 EZ CLEAN MEGAD (MISCELLANEOUS) ×4
ELECT REM PT RETURN 9FT ADLT (ELECTROSURGICAL) ×8
ELECTRODE BLDE 4.0 EZ CLN MEGD (MISCELLANEOUS) ×3 IMPLANT
ELECTRODE REM PT RTRN 9FT ADLT (ELECTROSURGICAL) ×6 IMPLANT
FELT TEFLON 1X6 (MISCELLANEOUS) ×8 IMPLANT
FILTER SMOKE EVAC ULPA (FILTER) ×4 IMPLANT
GAUZE SPONGE 4X4 12PLY STRL (GAUZE/BANDAGES/DRESSINGS) ×8 IMPLANT
GAUZE SPONGE 4X4 12PLY STRL LF (GAUZE/BANDAGES/DRESSINGS) ×2 IMPLANT
GLOVE BIO SURGEON STRL SZ 6.5 (GLOVE) ×20 IMPLANT
GLOVE BIOGEL PI IND STRL 6.5 (GLOVE) IMPLANT
GLOVE BIOGEL PI IND STRL 7.5 (GLOVE) IMPLANT
GLOVE BIOGEL PI INDICATOR 6.5 (GLOVE) ×3
GLOVE BIOGEL PI INDICATOR 7.5 (GLOVE) ×1
GOWN STRL REUS W/ TWL LRG LVL3 (GOWN DISPOSABLE) ×12 IMPLANT
GOWN STRL REUS W/TWL LRG LVL3 (GOWN DISPOSABLE) ×14
HEMOSTAT POWDER SURGIFOAM 1G (HEMOSTASIS) ×12 IMPLANT
HEMOSTAT SURGICEL 2X14 (HEMOSTASIS) ×4 IMPLANT
KIT BASIN OR (CUSTOM PROCEDURE TRAY) ×4 IMPLANT
KIT CATH SUCT 8FR (CATHETERS) ×4 IMPLANT
KIT SUCTION CATH 14FR (SUCTIONS) ×8 IMPLANT
KIT TURNOVER KIT B (KITS) ×4 IMPLANT
KIT VASOVIEW HEMOPRO 2 VH 4000 (KITS) ×4 IMPLANT
LEAD PACING MYOCARDI (MISCELLANEOUS) ×4 IMPLANT
MARKER GRAFT CORONARY BYPASS (MISCELLANEOUS) ×12 IMPLANT
NS IRRIG 1000ML POUR BTL (IV SOLUTION) ×21 IMPLANT
PACK E OPEN HEART (SUTURE) ×4 IMPLANT
PACK OPEN HEART (CUSTOM PROCEDURE TRAY) ×4 IMPLANT
PAD ARMBOARD 7.5X6 YLW CONV (MISCELLANEOUS) ×8 IMPLANT
PAD ELECT DEFIB RADIOL ZOLL (MISCELLANEOUS) ×4 IMPLANT
PENCIL BUTTON HOLSTER BLD 10FT (ELECTRODE) ×4 IMPLANT
PENCIL SMOKE EVACUATOR (MISCELLANEOUS) ×5 IMPLANT
POSITIONER HEAD DONUT 9IN (MISCELLANEOUS) ×4 IMPLANT
PUNCH AORTIC ROTATE 4.5MM 8IN (MISCELLANEOUS) ×1 IMPLANT
SET CARDIOPLEGIA MPS 5001102 (MISCELLANEOUS) ×1 IMPLANT
SLEEVE SUCTION 125 (MISCELLANEOUS) ×5 IMPLANT
SOL ANTI FOG 6CC (MISCELLANEOUS) IMPLANT
SOLUTION ANTI FOG 6CC (MISCELLANEOUS) ×1
SPONGE LAP 18X18 RF (DISPOSABLE) ×2 IMPLANT
SURGIFLO W/THROMBIN 8M KIT (HEMOSTASIS) ×1 IMPLANT
SUT BONE WAX W31G (SUTURE) ×4 IMPLANT
SUT MNCRL AB 4-0 PS2 18 (SUTURE) ×1 IMPLANT
SUT PROLENE 3 0 SH1 36 (SUTURE) ×4 IMPLANT
SUT PROLENE 4 0 TF (SUTURE) ×8 IMPLANT
SUT PROLENE 6 0 C 1 30 (SUTURE) ×2 IMPLANT
SUT PROLENE 6 0 CC (SUTURE) ×9 IMPLANT
SUT PROLENE 7 0 BV1 MDA (SUTURE) ×4 IMPLANT
SUT PROLENE 8 0 BV175 6 (SUTURE) ×3 IMPLANT
SUT STEEL 6MS V (SUTURE) ×4 IMPLANT
SUT STEEL SZ 6 DBL 3X14 BALL (SUTURE) ×4 IMPLANT
SUT VIC AB 1 CTX 18 (SUTURE) ×8 IMPLANT
SUT VIC AB 2-0 CT1 27 (SUTURE) ×1
SUT VIC AB 2-0 CT1 TAPERPNT 27 (SUTURE) IMPLANT
SYSTEM SAHARA CHEST DRAIN ATS (WOUND CARE) ×4 IMPLANT
TAPE CLOTH SURG 4X10 WHT LF (GAUZE/BANDAGES/DRESSINGS) ×1 IMPLANT
TAPE PAPER 2X10 WHT MICROPORE (GAUZE/BANDAGES/DRESSINGS) ×1 IMPLANT
TOWEL GREEN STERILE (TOWEL DISPOSABLE) ×5 IMPLANT
TOWEL GREEN STERILE FF (TOWEL DISPOSABLE) ×4 IMPLANT
TRAP FLUID SMOKE EVACUATOR (MISCELLANEOUS) ×4 IMPLANT
TRAY FOLEY SLVR 16FR TEMP STAT (SET/KITS/TRAYS/PACK) ×4 IMPLANT
TUBE CONNECTING 20X1/4 (TUBING) ×1 IMPLANT
TUBING LAP HI FLOW INSUFFLATIO (TUBING) ×4 IMPLANT
UNDERPAD 30X36 HEAVY ABSORB (UNDERPADS AND DIAPERS) ×4 IMPLANT
WATER STERILE IRR 1000ML POUR (IV SOLUTION) ×8 IMPLANT

## 2019-06-14 NOTE — Plan of Care (Signed)

## 2019-06-14 NOTE — Brief Op Note (Signed)
      [address]       [place] [address]             [phone]       06/14/2019  2:48 PM  PATIENT:  Tonya Chandler  59 y.o. female  PRE-OPERATIVE DIAGNOSIS:  CORONARY ARTERY DISEASE  POST-OPERATIVE DIAGNOSIS:  CORONARY ARTERY DISEASE  PROCEDURE:  Procedure(s):  CORONARY ARTERY BYPASS GRAFTING x 3 -LIMA to LAD -SEQ SVG to Distal RCA and Distal Circumflex  ENDOSCOPIC HARVEST GREATER SAPHENOUS VEIN -Right Leg  TRANSESOPHAGEAL ECHOCARDIOGRAM (TEE) (N/A)  SURGEON:  Surgeon(s) and Role:    * [name], MD - Primary  PHYSICIAN ASSISTANT: Erin Barrett PA-C  ASSISTANTS: none   ANESTHESIA:   general  EBL:  500 mL   BLOOD ADMINISTERED: CELLSAVER  DRAINS: Left Pleural Chest Tube, Mediastinal Chest Drains   LOCAL MEDICATIONS USED:  NONE  SPECIMEN:  No Specimen  DISPOSITION OF SPECIMEN:  N/A  COUNTS:  YES   DICTATION: .Dragon Dictation  PLAN OF CARE: Admit to inpatient   PATIENT DISPOSITION:  ICU - intubated and hemodynamically stable.   Delay start of Pharmacological VTE agent (>24hrs) due to surgical blood loss or risk of bleeding: yes

## 2019-06-14 NOTE — Anesthesia Postprocedure Evaluation (Signed)
Anesthesia Post Note  Patient: Tonya Chandler  Procedure(s) Performed: CORONARY ARTERY BYPASS GRAFTING (CABG) using LIMA to LAD; Endoscopic Right Greater Saphenous Vein Harvest: SVG to Circ (distal); SVG to RCA (distal). (N/A Chest) TRANSESOPHAGEAL ECHOCARDIOGRAM (TEE) (N/A ) Endovein Harvest Of Greater Saphenous Vein (Right Leg Upper)     Patient location during evaluation: SICU Anesthesia Type: General Level of consciousness: awake and alert and patient remains intubated per anesthesia plan Pain management: pain level controlled Vital Signs Assessment: post-procedure vital signs reviewed and stable Respiratory status: patient on ventilator - see flowsheet for VS and respiratory function stable Cardiovascular status: blood pressure returned to baseline and stable Postop Assessment: no apparent nausea or vomiting Anesthetic complications: no    Last Vitals:  Vitals:   06/14/19 1524 06/14/19 1600  BP: 110/60 125/78  Pulse: 90 90  Resp: 14 (!) 21  Temp: 37.7 C 37.7 C  SpO2: 97% 97%    Last Pain:  Vitals:   06/14/19 1600  TempSrc: Core  PainSc:                  Truxton Stupka DAVID

## 2019-06-14 NOTE — Anesthesia Procedure Notes (Signed)
Central Venous Catheter Insertion Performed by: Roberts Gaudy, MD, anesthesiologist Start/End5/24/2021 6:30 AM, 06/14/2019 6:35 AM Patient location: Pre-op. Preanesthetic checklist: patient identified, IV checked, site marked, risks and benefits discussed, surgical consent, monitors and equipment checked, pre-op evaluation, timeout performed and anesthesia consent Hand hygiene performed  and maximum sterile barriers used  PA cath was placed.Swan type:thermodilution Procedure performed without using ultrasound guided technique. Attempts: 1 Following insertion, line sutured, dressing applied and Biopatch. Post procedure assessment: blood return through all ports, free fluid flow and no air  Patient tolerated the procedure well with no immediate complications.

## 2019-06-14 NOTE — Progress Notes (Signed)
  Echocardiogram Echocardiogram Transesophageal has been performed.  Tonya Chandler 06/14/2019, 8:35 AM

## 2019-06-14 NOTE — Anesthesia Procedure Notes (Signed)
Central Venous Catheter Insertion Performed by: Roberts Gaudy, MD, anesthesiologist Start/End5/24/2021 6:30 AM, 06/14/2019 6:35 AM Patient location: Pre-op. Preanesthetic checklist: patient identified, IV checked, site marked, risks and benefits discussed, surgical consent, monitors and equipment checked, pre-op evaluation, timeout performed and anesthesia consent Position: supine Lidocaine 1% used for infiltration and patient sedated Hand hygiene performed  and maximum sterile barriers used  Catheter size: 9 Fr Sheath introducer Procedure performed using ultrasound guided technique. Ultrasound Notes:anatomy identified, needle tip was noted to be adjacent to the nerve/plexus identified, no ultrasound evidence of intravascular and/or intraneural injection and image(s) printed for medical record Attempts: 1 Following insertion, line sutured and dressing applied. Post procedure assessment: blood return through all ports, free fluid flow and no air  Patient tolerated the procedure well with no immediate complications.

## 2019-06-14 NOTE — Progress Notes (Signed)
.       [address]       [place] [address]             [phone]    Pre Procedure note for inpatients:   Tonya Chandler has been scheduled for Procedure(s): CORONARY ARTERY BYPASS GRAFTING (CABG) (N/A) TRANSESOPHAGEAL ECHOCARDIOGRAM (TEE) (N/A) today. The various methods of treatment have been discussed with the patient. After consideration of the risks, benefits and treatment options the patient has consented to the planned procedure.   The patient has been seen and labs reviewed. There are no changes in the patient's condition to prevent proceeding with the planned procedure today.  Recent labs:  Lab Results  Component Value Date   WBC 7.6 06/13/2019   HGB 15.6 (H) 06/13/2019   HCT 46.8 (H) 06/13/2019   PLT 297 06/13/2019   GLUCOSE 196 (H) 06/13/2019   CHOL 216 (H) 09/30/2018   TRIG 421 (H) 09/30/2018   HDL 58 09/30/2018   LDLCALC  09/30/2018             ALT 14 06/13/2019   AST 18 06/13/2019   NA 138 06/13/2019   K 4.2 06/13/2019   CL 98 06/13/2019   CREATININE 0.50 06/13/2019   BUN 10 06/13/2019   CO2 29 06/13/2019   INR 0.9 06/13/2019   HGBA1C 7.7 (H) 06/13/2019    [name], MD 06/14/2019 7:14 AM

## 2019-06-14 NOTE — Anesthesia Procedure Notes (Signed)
Arterial Line Insertion Start/End5/24/2021 7:00 AM, 06/14/2019 7:10 AM Performed by: Harden Mo, CRNA, CRNA  Preanesthetic checklist: patient identified, IV checked, site marked, risks and benefits discussed, surgical consent, monitors and equipment checked, pre-op evaluation and anesthesia consent Lidocaine 1% used for infiltration and patient sedated Left, radial was placed Catheter size: 20 G Hand hygiene performed  and maximum sterile barriers used  Allen's test indicative of satisfactory collateral circulation Attempts: 1 Procedure performed without using ultrasound guided technique. Ultrasound Notes:anatomy identified, needle tip was noted to be adjacent to the nerve/plexus identified and no ultrasound evidence of intravascular and/or intraneural injection Following insertion, dressing applied. Post procedure assessment: normal  Patient tolerated the procedure well with no immediate complications.

## 2019-06-14 NOTE — Anesthesia Procedure Notes (Signed)
Procedure Name: Intubation Date/Time: 06/14/2019 7:48 AM Performed by: Harden Mo, CRNA Pre-anesthesia Checklist: Patient identified, Emergency Drugs available, Suction available and Patient being monitored Patient Re-evaluated:Patient Re-evaluated prior to induction Oxygen Delivery Method: Circle System Utilized Preoxygenation: Pre-oxygenation with 100% oxygen Induction Type: IV induction Ventilation: Mask ventilation without difficulty and Oral airway inserted - appropriate to patient size Laryngoscope Size: Sabra Heck and 2 Grade View: Grade I Tube type: Oral Tube size: 8.0 mm Number of attempts: 1 Airway Equipment and Method: Stylet and Oral airway Placement Confirmation: ETT inserted through vocal cords under direct vision,  positive ETCO2 and breath sounds checked- equal and bilateral Secured at: 20 cm Tube secured with: Tape Dental Injury: Teeth and Oropharynx as per pre-operative assessment

## 2019-06-14 NOTE — Op Note (Signed)
NAMEGENELL, CADWELL MEDICAL RECORD [id number] ACCOUNT [id number] DATE OF BIRTH:April 10, 1960 FACILITY: [facility] LOCATION: [facility] PHYSICIAN:Ervey Fallin Maryruth Bun, MD  OPERATIVE REPORT  DATE OF PROCEDURE:  06/14/2019  PREOPERATIVE DIAGNOSIS:  Coronary occlusive disease with unstable angina.  POSTOPERATIVE DIAGNOSIS:  Coronary occlusive disease with unstable angina.  SURGICAL PROCEDURE:  Coronary artery bypass grafting x3 with the left internal mammary to the left anterior descending coronary artery, sequential reverse saphenous vein graft to the distal right coronary artery and distal circumflex coronary artery with  right greater saphenous thigh endoscopic vein harvesting.  SURGEON:  Lanelle Bal, MD  FIRST ASSISTANT:  Ellwood Handler, PA-C  BRIEF HISTORY:  The patient is a 59 year old diabetic female who presented with rapidly escalating anginal chest pain.  She underwent cardiac catheterization on this admission by Dr. Fletcher Anon, which demonstrated a complex proximal LAD stenosis of greater  than 90%.  In addition, total occlusion of the distal circumflex after the takeoff of a large first obtuse marginal and a proximal 90% stenosis of the right coronary artery, which was a relatively small nondominant vessel.  Ejection fraction was  decreased with inferolateral hypokinesis with ejection fraction of approximately 50%.  Because of the patient's 3-vessel coronary artery disease and symptoms in the setting of diabetes, coronary artery bypass grafting was recommended to the patient who  agreed and signed informed consent.  DESCRIPTION OF PROCEDURE:  With Swan-Ganz and arterial line monitors in place, the patient underwent general endotracheal anesthesia without incident.  The skin of the chest and legs was prepped with Betadine, draped in the usual sterile manner.   Appropriate timeout was performed.  The TEE probe was placed by fluoroscopy, confirming the preoperative echocardiographic findings.  There  was a question of intra atrial shunt however this appeared to be a very soft finding. Appropriate timeout was performed The right greater saphenous vein was harvested endoscopically and was of  excellent quality and caliber.  Median sternotomy was performed.  The left internal mammary artery was dissected down as a pedicle graft.  The distal artery was divided and had good free flow.  The vessel was hydrostatically dilated with heparinized  saline.  The pericardium was opened.  Overall ventricular function appeared preserved, though on the inferior surface in the distribution of the distal circumflex was area of scarring from old transmural myocardial infarction.  The patient was  systemically heparinized.  The ascending aorta was cannulated.  The right atrium was cannulated.  An aortic root vent cardioplegia needle was introduced into the ascending aorta.  The patient was placed on cardiopulmonary bypass 2.4 L per minute per  meter square.  Sites of anastomosis were dissected out of the epicardium.  The proximal two-thirds of the LAD was intramyocardial.  The distal third was evident on the surface.  The aortic crossclamp was applied and 500 mL cold blood potassium  cardioplegia was administered.  Myocardial septal temperatures monitored throughout the crossclamp.  Topical cold was applied.  We turned our attention first to the distal right coronary artery and posterior descending.  The posterior descending was  relatively small.  The distal right coronary artery was a good quality vessel, though slightly small.  The vessel was opened, admitted a 1.5 mm probe distally.  Using a longitudinal side-to-side anastomosis with a running 7-0 Prolene, a segment of  reverse saphenous vein graft was anastomosed to the distal right coronary artery.  The heart was elevated and the distal extent of the same vein was then carried to the distal circumflex branch.  This vessel was slightly smaller.  The vessel was opened   using running 7-0 Prolene.  A distal anastomosis was performed.  Additional cold blood cardioplegia was administered down the vein graft.  Attention was then turned to the left anterior descending coronary artery.  As mentioned, the proximal two-thirds  of the LAD were intramyocardial.  The distal third of the LAD was evident on the surface of the heart.  The vessel was opened and admitted a 1.5 mm probe proximally and distally.  Using a running 8-0 Prolene, the left internal mammary artery was  anastomosed to the left anterior descending coronary artery.  The fascia of the pedicle mammary was tacked to the epicardium.  With release of bulldog on the mammary artery, there was rise in myocardial septal temperature.  The bulldog was placed back on  the mammary artery.  Additional cold blood cardioplegia was administered.  We turned our attention to the proximal anastomosis.  The vein was trimmed to appropriate length.  A single punch aortotomy was performed and the vein graft was anastomosed to  the ascending aorta.  The heart was allowed to passively fill and deair.  Bulldog on the mammary artery was removed with prompt rise in myocardial septal temperature.  The proximal anastomosis was completed and the aortic crossclamp was removed with a  total crossclamp time of 69 minutes.  The patient converted to a sinus rhythm.  Sites of anastomoses were inspected and were free of bleeding.  Atrial and ventricular pacing wires were applied.  The patient was then ventilated and weaned from  cardiopulmonary bypass without difficulty.  She was decannulated in the usual fashion.  Protamine sulfate was administered.  With the operative field hemostatic, graft marks were applied.  A left pleural tube and a Blake mediastinal drain were left in  place.  Sternum was closed with #6 stainless steel wire.  Fascia closed with interrupted 0 Vicryl, running 3-0 Vicryl in subcutaneous tissue, 3-0 subcuticular stitch in the skin  edges.  Dry dressings were applied.  Sponge and needle count was reported as  correct at completion of the procedure.  The patient tolerated the procedure without obvious complication.  Total pump time was 100 minutes.  The patient did not require any blood bank blood products during the operative procedure.  She was transferred  to the surgical intensive care unit having tolerated the procedure without obvious complication.  VN/NUANCE  D:06/14/2019 T:06/14/2019 JOB:[id number]

## 2019-06-14 NOTE — Transfer of Care (Signed)
Immediate Anesthesia Transfer of Care Note  Patient: Tonya Chandler  Procedure(s) Performed: CORONARY ARTERY BYPASS GRAFTING (CABG) using LIMA to LAD; Endoscopic Right Greater Saphenous Vein Harvest: SVG to Circ (distal); SVG to RCA (distal). (N/A Chest) TRANSESOPHAGEAL ECHOCARDIOGRAM (TEE) (N/A ) Endovein Harvest Of Greater Saphenous Vein (Right Leg Upper)  Patient Location: SICU  Anesthesia Type:General  Level of Consciousness: sedated, unresponsive and Patient remains intubated per anesthesia plan  Airway & Oxygen Therapy: Patient remains intubated per anesthesia plan and Patient placed on Ventilator (see vital sign flow sheet for setting)  Post-op Assessment: Report given to RN and Post -op Vital signs reviewed and stable  Post vital signs: Reviewed and stable  Last Vitals:  Vitals Value Taken Time  BP    Temp    Pulse    Resp    SpO2      Last Pain:  Vitals:   06/14/19 0335  TempSrc: Oral  PainSc:       Patients Stated Pain Goal: 3 ([id number])  Complications: No apparent anesthesia complications

## 2019-06-14 NOTE — Procedures (Signed)
Extubation Procedure Note  Patient Details:   Name: Tonya Chandler DOB: 01-06-1961 MRN: [id number]   Airway Documentation:    Vent end date: 06/14/19 Vent end time: 1620   Evaluation  O2 sats: stable throughout Complications: No apparent complications Patient did tolerate procedure well. Bilateral Breath Sounds: Clear, Diminished   Pt extubated to 4L [place] per rapid wean protocol. NIF was -30 and VC was 794ml. Pt had positive cuff leak and no stridor noted.   Vilinda Blanks 06/14/2019, 4:23 PM

## 2019-06-14 NOTE — Anesthesia Preprocedure Evaluation (Addendum)
Anesthesia Evaluation  Patient identified by MRN, date of birth, ID band Patient awake    Reviewed: Allergy & Precautions, NPO status , Patient's Chart, lab work & pertinent test results  Airway Mallampati: II  TM Distance: >3 FB Neck ROM: Full    Dental  (+) Teeth Intact, Dental Advisory Given   Pulmonary    Pulmonary exam normal        Cardiovascular hypertension, Pt. on medications + CAD  Normal cardiovascular exam     Neuro/Psych Anxiety Depression    GI/Hepatic GERD  Medicated and Controlled,  Endo/Other  diabetes, Type 2, Insulin Dependent  Renal/GU      Musculoskeletal   Abdominal   Peds  Hematology   Anesthesia Other Findings   Reproductive/Obstetrics                            Anesthesia Physical Anesthesia Plan  ASA: III  Anesthesia Plan: General   Post-op Pain Management:    Induction: Intravenous  PONV Risk Score and Plan: 3 and Treatment may vary due to age or medical condition  Airway Management Planned: Oral ETT  Additional Equipment: Arterial line, PA Cath, Ultrasound Guidance Line Placement and TEE  Intra-op Plan:   Post-operative Plan: Post-operative intubation/ventilation  Informed Consent: I have reviewed the patients History and Physical, chart, labs and discussed the procedure including the risks, benefits and alternatives for the proposed anesthesia with the patient or authorized representative who has indicated his/her understanding and acceptance.       Plan Discussed with: CRNA and Surgeon  Anesthesia Plan Comments:         Anesthesia Quick Evaluation

## 2019-06-14 NOTE — Progress Notes (Signed)
Patient ID: Tonya Chandler, female   DOB: 1960/04/16, 59 y.o.   MRN: [id number]  TCTS Evening Rounds:   Hemodynamically stable  CI = 2.4  Extubated and awake. Having pain.  Urine output good  CT output low  CBC    Component Value Date/Time   WBC 12.7 (H) 06/14/2019 1830   RBC 3.94 06/14/2019 1830   HGB 11.2 (L) 06/14/2019 1858   HGB 16.1 (H) 06/04/2019 1625   HCT 33.0 (L) 06/14/2019 1858   HCT 47.6 (H) 06/04/2019 1625   PLT 229 06/14/2019 1830   PLT 345 06/04/2019 1625   MCV 90.1 06/14/2019 1830   MCV 88 06/04/2019 1625   MCH 29.9 06/14/2019 1830   MCHC 33.2 06/14/2019 1830   RDW 12.4 06/14/2019 1830   RDW 13.0 06/04/2019 1625   LYMPHSABS 2.8 03/30/2018 0924   MONOABS 0.7 03/30/2018 0924   EOSABS 0.5 03/30/2018 0924   BASOSABS 0.1 03/30/2018 0924     BMET    Component Value Date/Time   NA 140 06/14/2019 1858   NA 139 06/04/2019 1625   K 4.4 06/14/2019 1858   CL 108 06/14/2019 1830   CO2 25 06/14/2019 1830   GLUCOSE 159 (H) 06/14/2019 1830   BUN 8 06/14/2019 1830   BUN 12 06/04/2019 1625   CREATININE 0.44 06/14/2019 1830   CREATININE 0.60 09/30/2018 1218   CALCIUM 7.3 (L) 06/14/2019 1830   GFRNONAA >60 06/14/2019 1830   GFRNONAA 99 09/30/2018 1218   GFRAA >60 06/14/2019 1830   GFRAA 115 09/30/2018 1218     A/P:  Stable postop course. Continue current plans

## 2019-06-15 ENCOUNTER — Inpatient Hospital Stay (HOSPITAL_COMMUNITY): Payer: BC Managed Care – PPO

## 2019-06-15 ENCOUNTER — Encounter: Payer: Self-pay | Admitting: *Deleted

## 2019-06-15 DIAGNOSIS — I25110 Atherosclerotic heart disease of native coronary artery with unstable angina pectoris: Secondary | ICD-10-CM | POA: Diagnosis not present

## 2019-06-15 DIAGNOSIS — E1140 Type 2 diabetes mellitus with diabetic neuropathy, unspecified: Secondary | ICD-10-CM | POA: Diagnosis not present

## 2019-06-15 DIAGNOSIS — J9811 Atelectasis: Secondary | ICD-10-CM | POA: Diagnosis not present

## 2019-06-15 DIAGNOSIS — Z20822 Contact with and (suspected) exposure to covid-19: Secondary | ICD-10-CM | POA: Diagnosis not present

## 2019-06-15 DIAGNOSIS — E1165 Type 2 diabetes mellitus with hyperglycemia: Secondary | ICD-10-CM | POA: Diagnosis not present

## 2019-06-15 LAB — BASIC METABOLIC PANEL
Anion gap: 9 (ref 5–15)
Anion gap: 11 (ref 5–15)
BUN: 8 mg/dL (ref 6–20)
BUN: 7 mg/dL (ref 6–20)
CO2: 26 mmol/L (ref 22–32)
CO2: 25 mmol/L (ref 22–32)
Calcium: 7.4 mg/dL — ABNORMAL LOW (ref 8.9–10.3)
Calcium: 7.8 mg/dL — ABNORMAL LOW (ref 8.9–10.3)
Chloride: 102 mmol/L (ref 98–111)
Chloride: 97 mmol/L — ABNORMAL LOW (ref 98–111)
Creatinine, Ser: 0.52 mg/dL (ref 0.44–1.00)
Creatinine, Ser: 0.55 mg/dL (ref 0.44–1.00)
GFR calc Af Amer: >60 mL/min (ref >60)
GFR calc Af Amer: >60 mL/min (ref >60)
GFR calc non Af Amer: >60 mL/min (ref >60)
GFR calc non Af Amer: >60 mL/min (ref >60)
Glucose, Bld: 129 mg/dL — ABNORMAL HIGH (ref 70–99)
Glucose, Bld: 193 mg/dL — ABNORMAL HIGH (ref 70–99)
Potassium: 3.9 mmol/L (ref 3.5–5.1)
Potassium: 4.5 mmol/L (ref 3.5–5.1)
Sodium: 137 mmol/L (ref 135–145)
Sodium: 133 mmol/L — ABNORMAL LOW (ref 135–145)

## 2019-06-15 LAB — POCT I-STAT 7, (LYTES, BLD GAS, ICA,H+H)
Acid-Base Excess: 1 mmol/L (ref 0.0–2.0)
Acid-Base Excess: 1 mmol/L (ref 0.0–2.0)
Acid-Base Excess: 3 mmol/L — ABNORMAL HIGH (ref 0.0–2.0)
Acid-Base Excess: 7 mmol/L — ABNORMAL HIGH (ref 0.0–2.0)
Acid-Base Excess: 8 mmol/L — ABNORMAL HIGH (ref 0.0–2.0)
Acid-Base Excess: 9 mmol/L — ABNORMAL HIGH (ref 0.0–2.0)
Bicarbonate: 27.4 mmol/L (ref 20.0–28.0)
Bicarbonate: 26.1 mmol/L (ref 20.0–28.0)
Bicarbonate: 28.8 mmol/L — ABNORMAL HIGH (ref 20.0–28.0)
Bicarbonate: 29.9 mmol/L — ABNORMAL HIGH (ref 20.0–28.0)
Bicarbonate: 30.5 mmol/L — ABNORMAL HIGH (ref 20.0–28.0)
Bicarbonate: 31.7 mmol/L — ABNORMAL HIGH (ref 20.0–28.0)
Calcium, Ion: 1.13 mmol/L — ABNORMAL LOW (ref 1.15–1.40)
Calcium, Ion: 0.85 mmol/L — CL (ref 1.15–1.40)
Calcium, Ion: 0.96 mmol/L — ABNORMAL LOW (ref 1.15–1.40)
Calcium, Ion: 1 mmol/L — ABNORMAL LOW (ref 1.15–1.40)
Calcium, Ion: 1.04 mmol/L — ABNORMAL LOW (ref 1.15–1.40)
Calcium, Ion: 1.21 mmol/L (ref 1.15–1.40)
HCT: 34 % — ABNORMAL LOW (ref 36.0–46.0)
HCT: 29 % — ABNORMAL LOW (ref 36.0–46.0)
HCT: 29 % — ABNORMAL LOW (ref 36.0–46.0)
HCT: 31 % — ABNORMAL LOW (ref 36.0–46.0)
HCT: 32 % — ABNORMAL LOW (ref 36.0–46.0)
HCT: 43 % (ref 36.0–46.0)
Hemoglobin: 11.6 g/dL — ABNORMAL LOW (ref 12.0–15.0)
Hemoglobin: 10.5 g/dL — ABNORMAL LOW (ref 12.0–15.0)
Hemoglobin: 10.9 g/dL — ABNORMAL LOW (ref 12.0–15.0)
Hemoglobin: 14.6 g/dL (ref 12.0–15.0)
Hemoglobin: 9.9 g/dL — ABNORMAL LOW (ref 12.0–15.0)
Hemoglobin: 9.9 g/dL — ABNORMAL LOW (ref 12.0–15.0)
O2 Saturation: 92 %
O2 Saturation: 100 %
O2 Saturation: 100 %
O2 Saturation: 100 %
O2 Saturation: 96 %
O2 Saturation: 99 %
Patient temperature: 37.8
Potassium: 4 mmol/L (ref 3.5–5.1)
Potassium: 3 mmol/L — ABNORMAL LOW (ref 3.5–5.1)
Potassium: 3.1 mmol/L — ABNORMAL LOW (ref 3.5–5.1)
Potassium: 3.7 mmol/L (ref 3.5–5.1)
Potassium: 3.7 mmol/L (ref 3.5–5.1)
Potassium: 4.3 mmol/L (ref 3.5–5.1)
Sodium: 137 mmol/L (ref 135–145)
Sodium: 135 mmol/L (ref 135–145)
Sodium: 137 mmol/L (ref 135–145)
Sodium: 138 mmol/L (ref 135–145)
Sodium: 138 mmol/L (ref 135–145)
Sodium: 140 mmol/L (ref 135–145)
TCO2: 29 mmol/L (ref 22–32)
TCO2: 27 mmol/L (ref 22–32)
TCO2: 30 mmol/L (ref 22–32)
TCO2: 32 mmol/L (ref 22–32)
TCO2: 32 mmol/L (ref 22–32)
TCO2: 33 mmol/L — ABNORMAL HIGH (ref 22–32)
pCO2 arterial: 53.5 mmHg — ABNORMAL HIGH (ref 32.0–48.0)
pCO2 arterial: 31 mmHg — ABNORMAL LOW (ref 32.0–48.0)
pCO2 arterial: 34.3 mmHg (ref 32.0–48.0)
pCO2 arterial: 35.7 mmHg (ref 32.0–48.0)
pCO2 arterial: 42.3 mmHg (ref 32.0–48.0)
pCO2 arterial: 53.3 mmHg — ABNORMAL HIGH (ref 32.0–48.0)
pH, Arterial: 7.321 — ABNORMAL LOW (ref 7.350–7.450)
pH, Arterial: 7.357 (ref 7.350–7.450)
pH, Arterial: 7.398 (ref 7.350–7.450)
pH, Arterial: 7.557 — ABNORMAL HIGH (ref 7.350–7.450)
pH, Arterial: 7.558 — ABNORMAL HIGH (ref 7.350–7.450)
pH, Arterial: 7.576 — ABNORMAL HIGH (ref 7.350–7.450)
pO2, Arterial: 72 mmHg — ABNORMAL LOW (ref 83.0–108.0)
pO2, Arterial: 160 mmHg — ABNORMAL HIGH (ref 83.0–108.0)
pO2, Arterial: 301 mmHg — ABNORMAL HIGH (ref 83.0–108.0)
pO2, Arterial: 323 mmHg — ABNORMAL HIGH (ref 83.0–108.0)
pO2, Arterial: 410 mmHg — ABNORMAL HIGH (ref 83.0–108.0)
pO2, Arterial: 80 mmHg — ABNORMAL LOW (ref 83.0–108.0)

## 2019-06-15 LAB — URINALYSIS, ROUTINE W REFLEX MICROSCOPIC
Bilirubin Urine: NEGATIVE
Glucose, UA: 50 mg/dL — AB
Hgb urine dipstick: NEGATIVE
Ketones, ur: NEGATIVE mg/dL
Leukocytes,Ua: NEGATIVE
Nitrite: NEGATIVE
Protein, ur: NEGATIVE mg/dL
Specific Gravity, Urine: 1.013 (ref 1.005–1.030)
pH: 5 (ref 5.0–8.0)

## 2019-06-15 LAB — POCT I-STAT, CHEM 8
BUN: 13 mg/dL (ref 6–20)
BUN: 10 mg/dL (ref 6–20)
BUN: 11 mg/dL (ref 6–20)
BUN: 10 mg/dL (ref 6–20)
BUN: 10 mg/dL (ref 6–20)
Calcium, Ion: 1.25 mmol/L (ref 1.15–1.40)
Calcium, Ion: 0.85 mmol/L — CL (ref 1.15–1.40)
Calcium, Ion: 0.98 mmol/L — ABNORMAL LOW (ref 1.15–1.40)
Calcium, Ion: 0.94 mmol/L — ABNORMAL LOW (ref 1.15–1.40)
Calcium, Ion: 1.02 mmol/L — ABNORMAL LOW (ref 1.15–1.40)
Chloride: 96 mmol/L — ABNORMAL LOW (ref 98–111)
Chloride: 95 mmol/L — ABNORMAL LOW (ref 98–111)
Chloride: 99 mmol/L (ref 98–111)
Chloride: 97 mmol/L — ABNORMAL LOW (ref 98–111)
Chloride: 97 mmol/L — ABNORMAL LOW (ref 98–111)
Creatinine, Ser: 0.5 mg/dL (ref 0.44–1.00)
Creatinine, Ser: 0.2 mg/dL — ABNORMAL LOW (ref 0.44–1.00)
Creatinine, Ser: 0.3 mg/dL — ABNORMAL LOW (ref 0.44–1.00)
Creatinine, Ser: 0.4 mg/dL — ABNORMAL LOW (ref 0.44–1.00)
Creatinine, Ser: 0.4 mg/dL — ABNORMAL LOW (ref 0.44–1.00)
Glucose, Bld: 224 mg/dL — ABNORMAL HIGH (ref 70–99)
Glucose, Bld: 132 mg/dL — ABNORMAL HIGH (ref 70–99)
Glucose, Bld: 142 mg/dL — ABNORMAL HIGH (ref 70–99)
Glucose, Bld: 123 mg/dL — ABNORMAL HIGH (ref 70–99)
Glucose, Bld: 163 mg/dL — ABNORMAL HIGH (ref 70–99)
HCT: 41 % (ref 36.0–46.0)
HCT: 23 % — ABNORMAL LOW (ref 36.0–46.0)
HCT: 32 % — ABNORMAL LOW (ref 36.0–46.0)
HCT: 26 % — ABNORMAL LOW (ref 36.0–46.0)
HCT: 26 % — ABNORMAL LOW (ref 36.0–46.0)
Hemoglobin: 13.9 g/dL (ref 12.0–15.0)
Hemoglobin: 10.9 g/dL — ABNORMAL LOW (ref 12.0–15.0)
Hemoglobin: 7.8 g/dL — ABNORMAL LOW (ref 12.0–15.0)
Hemoglobin: 8.8 g/dL — ABNORMAL LOW (ref 12.0–15.0)
Hemoglobin: 8.8 g/dL — ABNORMAL LOW (ref 12.0–15.0)
Potassium: 4.2 mmol/L (ref 3.5–5.1)
Potassium: 3 mmol/L — ABNORMAL LOW (ref 3.5–5.1)
Potassium: 3.7 mmol/L (ref 3.5–5.1)
Potassium: 3.6 mmol/L (ref 3.5–5.1)
Potassium: 3 mmol/L — ABNORMAL LOW (ref 3.5–5.1)
Sodium: 136 mmol/L (ref 135–145)
Sodium: 137 mmol/L (ref 135–145)
Sodium: 138 mmol/L (ref 135–145)
Sodium: 137 mmol/L (ref 135–145)
Sodium: 140 mmol/L (ref 135–145)
TCO2: 31 mmol/L (ref 22–32)
TCO2: 34 mmol/L — ABNORMAL HIGH (ref 22–32)
TCO2: 35 mmol/L — ABNORMAL HIGH (ref 22–32)
TCO2: 28 mmol/L (ref 22–32)
TCO2: 27 mmol/L (ref 22–32)

## 2019-06-15 LAB — GLUCOSE, CAPILLARY
Glucose-Capillary: 101 mg/dL — ABNORMAL HIGH (ref 70–99)
Glucose-Capillary: 120 mg/dL — ABNORMAL HIGH (ref 70–99)
Glucose-Capillary: 124 mg/dL — ABNORMAL HIGH (ref 70–99)
Glucose-Capillary: 128 mg/dL — ABNORMAL HIGH (ref 70–99)
Glucose-Capillary: 134 mg/dL — ABNORMAL HIGH (ref 70–99)
Glucose-Capillary: 139 mg/dL — ABNORMAL HIGH (ref 70–99)
Glucose-Capillary: 141 mg/dL — ABNORMAL HIGH (ref 70–99)
Glucose-Capillary: 150 mg/dL — ABNORMAL HIGH (ref 70–99)
Glucose-Capillary: 150 mg/dL — ABNORMAL HIGH (ref 70–99)
Glucose-Capillary: 155 mg/dL — ABNORMAL HIGH (ref 70–99)
Glucose-Capillary: 156 mg/dL — ABNORMAL HIGH (ref 70–99)
Glucose-Capillary: 161 mg/dL — ABNORMAL HIGH (ref 70–99)
Glucose-Capillary: 172 mg/dL — ABNORMAL HIGH (ref 70–99)
Glucose-Capillary: 207 mg/dL — ABNORMAL HIGH (ref 70–99)

## 2019-06-15 LAB — CBC
HCT: 35.3 % — ABNORMAL LOW (ref 36.0–46.0)
HCT: 35.2 % — ABNORMAL LOW (ref 36.0–46.0)
Hemoglobin: 11.5 g/dL — ABNORMAL LOW (ref 12.0–15.0)
Hemoglobin: 11.6 g/dL — ABNORMAL LOW (ref 12.0–15.0)
MCH: 29.9 pg (ref 26.0–34.0)
MCH: 30.4 pg (ref 26.0–34.0)
MCHC: 32.6 g/dL (ref 30.0–36.0)
MCHC: 33 g/dL (ref 30.0–36.0)
MCV: 91.9 fL (ref 80.0–100.0)
MCV: 92.1 fL (ref 80.0–100.0)
Platelets: 284 10*3/uL (ref 150–400)
Platelets: 251 10*3/uL (ref 150–400)
RBC: 3.84 MIL/uL — ABNORMAL LOW (ref 3.87–5.11)
RBC: 3.82 MIL/uL — ABNORMAL LOW (ref 3.87–5.11)
RDW: 12.9 % (ref 11.5–15.5)
RDW: 13.2 % (ref 11.5–15.5)
WBC: 16.4 10*3/uL — ABNORMAL HIGH (ref 4.0–10.5)
WBC: 18.1 10*3/uL — ABNORMAL HIGH (ref 4.0–10.5)
nRBC: 0 % (ref 0.0–0.2)
nRBC: 0 % (ref 0.0–0.2)

## 2019-06-15 LAB — MAGNESIUM
Magnesium: 1.5 mg/dL — ABNORMAL LOW (ref 1.7–2.4)
Magnesium: 1.7 mg/dL (ref 1.7–2.4)

## 2019-06-15 MED ORDER — INSULIN DETEMIR 100 UNIT/ML ~~LOC~~ SOLN
15.0000 [IU] | Freq: Once | SUBCUTANEOUS | Status: AC
  Administered 2019-06-15: 15 [IU] via SUBCUTANEOUS
  Filled 2019-06-15: qty 0.15

## 2019-06-15 MED ORDER — INSULIN ASPART 100 UNIT/ML ~~LOC~~ SOLN
0.0000 [IU] | SUBCUTANEOUS | Status: DC
  Administered 2019-06-15: 8 [IU] via SUBCUTANEOUS
  Administered 2019-06-15: 4 [IU] via SUBCUTANEOUS
  Administered 2019-06-16: 2 [IU] via SUBCUTANEOUS
  Administered 2019-06-16: 3 [IU] via SUBCUTANEOUS
  Administered 2019-06-16: 2 [IU] via SUBCUTANEOUS

## 2019-06-15 MED ORDER — INSULIN DETEMIR 100 UNIT/ML ~~LOC~~ SOLN
15.0000 [IU] | Freq: Every day | SUBCUTANEOUS | Status: DC
  Administered 2019-06-16: 15 [IU] via SUBCUTANEOUS
  Filled 2019-06-15 (×2): qty 0.15

## 2019-06-15 MED ORDER — ENOXAPARIN SODIUM 30 MG/0.3ML ~~LOC~~ SOLN
30.0000 mg | Freq: Every day | SUBCUTANEOUS | Status: DC
  Administered 2019-06-15 – 2019-06-19 (×5): 30 mg via SUBCUTANEOUS
  Filled 2019-06-15 (×5): qty 0.3

## 2019-06-15 MED FILL — Magnesium Sulfate Inj 50%: INTRAMUSCULAR | Qty: 10 | Status: AC

## 2019-06-15 MED FILL — Potassium Chloride Inj 2 mEq/ML: INTRAVENOUS | Qty: 40 | Status: AC

## 2019-06-15 MED FILL — Heparin Sodium (Porcine) Inj 1000 Unit/ML: INTRAMUSCULAR | Qty: 30 | Status: AC

## 2019-06-15 NOTE — Progress Notes (Signed)
Patient ID: Tonya Chandler, female   DOB: Apr 28, 1960, 59 y.o.   MRN: [id number] TCTS DAILY ICU PROGRESS NOTE                   [address]            [place] [address]          [phone]   1 Day Post-Op Procedure(s) (LRB): CORONARY ARTERY BYPASS GRAFTING (CABG) using LIMA to LAD; Endoscopic Right Greater Saphenous Vein Harvest: SVG to Circ (distal); SVG to RCA (distal). (N/A) TRANSESOPHAGEAL ECHOCARDIOGRAM (TEE) (N/A) Endovein Harvest Of Greater Saphenous Vein (Right)  Total Length of Stay:  LOS: 4 days   Subjective: Patient awake alert neurologically intact extubated last night without difficulty  Objective: Vital signs in last 24 hours: Temp:  [98.6 F (37 C)-100.9 F (38.3 C)] 99.7 F (37.6 C) (05/25 0645) Pulse Rate:  [90] 90 (05/24 1622) Cardiac Rhythm: Atrial paced (05/24 2000) Resp:  [10-24] 19 (05/25 0645) BP: (93-129)/(60-88) 114/71 (05/25 0645) SpO2:  [75 %-100 %] 96 % (05/25 0645) Arterial Line BP: (84-175)/(59-97) 146/65 (05/25 0645) FiO2 (%):  [40 %-50 %] 40 % (05/24 1553)  Filed Weights   06/12/19 0557 06/13/19 0113 06/14/19 0335  Weight: 71.5 kg 71.2 kg 70.1 kg    Weight change:    Hemodynamic parameters for last 24 hours: PAP: (20-49)/(13-32) 36/22 CO:  [3.4 L/min-5.4 L/min] 4 L/min CI:  [2 L/min/m2-3.3 L/min/m2] 2.4 L/min/m2  Intake/Output from previous day: 05/24 0701 - 05/25 0700 In: 4865.6 [I.V.:3395.3; Blood:350; IV Piggyback:1120.3] Out: 2545 [id number]; Blood:500; Chest Tube:300]  Intake/Output this shift: No intake/output data recorded.  Current Meds: Scheduled Meds: . acetaminophen  1,000 mg Oral Q6H   Or  . acetaminophen (TYLENOL) oral liquid 160 mg/5 mL  1,000 mg Per Tube Q6H  . aspirin EC  325 mg Oral Daily   Or  . aspirin  324 mg Per Tube Daily  . atorvastatin  40 mg Oral q1800  . bisacodyl  10 mg Oral Daily   Or  . bisacodyl  10 mg Rectal Daily  . Chlorhexidine Gluconate Cloth  6 each Topical Daily  .  cyclobenzaprine  10 mg Oral QHS  . docusate sodium  200 mg Oral Daily  . DULoxetine  60 mg Oral Daily  . estradiol  1 mg Oral QHS  . metoprolol tartrate  12.5 mg Oral BID   Or  . metoprolol tartrate  12.5 mg Per Tube BID  . [START ON 06/16/2019] pantoprazole  40 mg Oral Daily  . sodium chloride flush  3 mL Intravenous Q12H   Continuous Infusions: . sodium chloride Stopped (06/14/19 1629)  . sodium chloride Stopped (06/15/19 0525)  . sodium chloride 10 mL/hr at 06/14/19 1437  . albumin human 12.5 g (06/14/19 1448)  . dexmedetomidine (PRECEDEX) IV infusion Stopped (06/14/19 1521)  . insulin 3.6 mL/hr at 06/15/19 0600  . lactated ringers    . lactated ringers 10 mL/hr at 06/15/19 0600  . lactated ringers 10 mL/hr at 06/15/19 0600  . levofloxacin (LEVAQUIN) IV    . nitroGLYCERIN Stopped (06/15/19 0333)  . phenylephrine (NEO-SYNEPHRINE) Adult infusion Stopped (06/14/19 1710)   PRN Meds:.sodium chloride, albumin human, dextrose, famotidine, lactated ringers, loteprednol, metoprolol tartrate, morphine injection, ondansetron (ZOFRAN) IV, oxyCODONE, sodium chloride flush, traMADol  General appearance: alert, cooperative and no distress Neurologic: intact Heart: regular rate and rhythm, S1, S2 normal, no murmur, click, rub or gallop Lungs: diminished breath sounds bibasilar Abdomen: soft,  non-tender; bowel sounds normal; no masses,  no organomegaly Extremities: extremities normal, atraumatic, no cyanosis or edema and Homans sign is negative, no sign of DVT Wound: Sternum stable dressing intact  Lab Results: CBC: Recent Labs    06/14/19 1830 06/14/19 1858 06/15/19 0052 06/15/19 0306  WBC 12.7*  --   --  16.4*  HGB 11.8*   < > 11.6* 11.5*  HCT 35.5*   < > 34.0* 35.3*  PLT 229  --   --  284   < > = values in this interval not displayed.   BMET:  Recent Labs    06/14/19 1830 06/14/19 1858 06/15/19 0052 06/15/19 0306  NA 139   < > 137 137  K 4.5   < > 4.0 3.9  CL 108  --   --   102  CO2 25  --   --  26  GLUCOSE 159*  --   --  129*  BUN 8  --   --  8  CREATININE 0.44  --   --  0.52  CALCIUM 7.3*  --   --  7.4*   < > = values in this interval not displayed.    CMET: Lab Results  Component Value Date   WBC 16.4 (H) 06/15/2019   HGB 11.5 (L) 06/15/2019   HCT 35.3 (L) 06/15/2019   PLT 284 06/15/2019   GLUCOSE 129 (H) 06/15/2019   CHOL 216 (H) 09/30/2018   TRIG 421 (H) 09/30/2018   HDL 58 09/30/2018   ALT 13 06/14/2019   AST 32 06/14/2019   NA 137 06/15/2019   K 3.9 06/15/2019   CL 102 06/15/2019   CREATININE 0.52 06/15/2019   BUN 8 06/15/2019   CO2 26 06/15/2019   INR 1.2 06/14/2019   HGBA1C 7.7 (H) 06/13/2019      PT/INR:  Recent Labs    06/14/19 1330  LABPROT 15.0  INR 1.2   Radiology: [facility] Chest Port 1 View  Result Date: 06/14/2019 CLINICAL DATA:  Postop CABG EXAM: PORTABLE CHEST 1 VIEW COMPARISON:  06/13/2019 FINDINGS: Postop CABG. Endotracheal tube in good position. Swan-Ganz catheter in the left pulmonary artery. Left chest tube in place. No pneumothorax on the left. Small right apical pneumothorax Hypoventilation with low lung volumes and bibasilar atelectasis. Negative for edema. NG tube in the stomach. IMPRESSION: Postop CABG.  Hypoventilation with bibasilar atelectasis. Small right apical pneumothorax. Left chest tube without pneumothorax. These results will be called to the ordering clinician or representative by the Radiologist Assistant, and communication documented in the PACS or [employer]. For Electronically Signed   By: Franchot Gallo M.D.   On: 06/14/2019 13:29   Film today not read by radiology yet , gastric distention noted    Assessment/Plan: S/P Procedure(s) (LRB): CORONARY ARTERY BYPASS GRAFTING (CABG) using LIMA to LAD; Endoscopic Right Greater Saphenous Vein Harvest: SVG to Circ (distal); SVG to RCA (distal). (N/A) TRANSESOPHAGEAL ECHOCARDIOGRAM (TEE) (N/A) Endovein Harvest Of Greater Saphenous Vein  (Right) Mobilize Diuresis Diabetes control d/c tubes/lines See progression orders Expected Acute  Blood - loss Anemia- continue to monitor - no blood products given    Tonya Chandler 06/15/2019 7:09 AM

## 2019-06-15 NOTE — Progress Notes (Signed)
TCTS BRIEF SICU PROGRESS NOTE  1 Day Post-Op  S/P Procedure(s) (LRB): CORONARY ARTERY BYPASS GRAFTING (CABG) using LIMA to LAD; Endoscopic Right Greater Saphenous Vein Harvest: SVG to Circ (distal); SVG to RCA (distal). (N/A) TRANSESOPHAGEAL ECHOCARDIOGRAM (TEE) (N/A) Endovein Harvest Of Greater Saphenous Vein (Right)   Stable day NSR w/ stable BP Breathing comfortably w/ O2 sats 94% UOP adequate Labs okay  Plan: Continue current plan  Rexene Alberts, MD 06/15/2019 6:53 PM

## 2019-06-16 ENCOUNTER — Inpatient Hospital Stay (HOSPITAL_COMMUNITY): Payer: BC Managed Care – PPO

## 2019-06-16 DIAGNOSIS — J9811 Atelectasis: Secondary | ICD-10-CM | POA: Diagnosis not present

## 2019-06-16 LAB — GLUCOSE, CAPILLARY
Glucose-Capillary: 123 mg/dL — ABNORMAL HIGH (ref 70–99)
Glucose-Capillary: 135 mg/dL — ABNORMAL HIGH (ref 70–99)
Glucose-Capillary: 171 mg/dL — ABNORMAL HIGH (ref 70–99)
Glucose-Capillary: 179 mg/dL — ABNORMAL HIGH (ref 70–99)

## 2019-06-16 LAB — CBC
HCT: 32.3 % — ABNORMAL LOW (ref 36.0–46.0)
Hemoglobin: 10.5 g/dL — ABNORMAL LOW (ref 12.0–15.0)
MCH: 30.4 pg (ref 26.0–34.0)
MCHC: 32.5 g/dL (ref 30.0–36.0)
MCV: 93.6 fL (ref 80.0–100.0)
Platelets: 226 10*3/uL (ref 150–400)
RBC: 3.45 MIL/uL — ABNORMAL LOW (ref 3.87–5.11)
RDW: 13.2 % (ref 11.5–15.5)
WBC: 14.4 10*3/uL — ABNORMAL HIGH (ref 4.0–10.5)
nRBC: 0 % (ref 0.0–0.2)

## 2019-06-16 LAB — BASIC METABOLIC PANEL
Anion gap: 7 (ref 5–15)
BUN: <5 mg/dL — ABNORMAL LOW (ref 6–20)
CO2: 29 mmol/L (ref 22–32)
Calcium: 8 mg/dL — ABNORMAL LOW (ref 8.9–10.3)
Chloride: 97 mmol/L — ABNORMAL LOW (ref 98–111)
Creatinine, Ser: 0.45 mg/dL (ref 0.44–1.00)
GFR calc Af Amer: >60 mL/min (ref >60)
GFR calc non Af Amer: >60 mL/min (ref >60)
Glucose, Bld: 148 mg/dL — ABNORMAL HIGH (ref 70–99)
Potassium: 3.9 mmol/L (ref 3.5–5.1)
Sodium: 133 mmol/L — ABNORMAL LOW (ref 135–145)

## 2019-06-16 MED ORDER — ONDANSETRON HCL 4 MG PO TABS: 4.0000 mg | ORAL_TABLET | Freq: Four times a day (QID) | ORAL | Status: DC | PRN

## 2019-06-16 MED ORDER — METOPROLOL TARTRATE 12.5 MG HALF TABLET
12.5000 mg | ORAL_TABLET | Freq: Two times a day (BID) | ORAL | Status: DC
  Administered 2019-06-16 – 2019-06-19 (×7): 12.5 mg via ORAL
  Filled 2019-06-16 (×6): qty 1

## 2019-06-16 MED ORDER — MOMETASONE FURO-FORMOTEROL FUM 200-5 MCG/ACT IN AERO
2.0000 | INHALATION_SPRAY | Freq: Two times a day (BID) | RESPIRATORY_TRACT | Status: DC
  Administered 2019-06-16 – 2019-06-20 (×7): 2 via RESPIRATORY_TRACT
  Filled 2019-06-16: qty 8.8

## 2019-06-16 MED ORDER — SODIUM CHLORIDE 0.9% FLUSH: 10.0000 mL | INTRAVENOUS | Status: DC | PRN

## 2019-06-16 MED ORDER — ~~LOC~~ CARDIAC SURGERY, PATIENT & FAMILY EDUCATION
Freq: Once | Status: AC
  Administered 2019-06-16: 1

## 2019-06-16 MED ORDER — BISACODYL 5 MG PO TBEC
10.0000 mg | DELAYED_RELEASE_TABLET | Freq: Every day | ORAL | Status: DC | PRN
  Administered 2019-06-16: 10 mg via ORAL

## 2019-06-16 MED ORDER — SODIUM CHLORIDE 0.9% FLUSH: 3.0000 mL | INTRAVENOUS | Status: DC | PRN

## 2019-06-16 MED ORDER — FUROSEMIDE 10 MG/ML IJ SOLN
40.0000 mg | Freq: Once | INTRAMUSCULAR | Status: AC
  Administered 2019-06-16: 40 mg via INTRAVENOUS
  Filled 2019-06-16: qty 4

## 2019-06-16 MED ORDER — TRAMADOL HCL 50 MG PO TABS
50.0000 mg | ORAL_TABLET | ORAL | Status: DC | PRN
  Administered 2019-06-16 – 2019-06-20 (×9): 100 mg via ORAL
  Filled 2019-06-16 (×9): qty 2

## 2019-06-16 MED ORDER — INSULIN ASPART 100 UNIT/ML ~~LOC~~ SOLN
0.0000 [IU] | Freq: Three times a day (TID) | SUBCUTANEOUS | Status: DC
  Administered 2019-06-16: 2 [IU] via SUBCUTANEOUS
  Administered 2019-06-16 – 2019-06-17 (×3): 4 [IU] via SUBCUTANEOUS
  Administered 2019-06-17: 8 [IU] via SUBCUTANEOUS
  Administered 2019-06-17: 2 [IU] via SUBCUTANEOUS
  Administered 2019-06-17: 4 [IU] via SUBCUTANEOUS
  Administered 2019-06-18 (×4): 2 [IU] via SUBCUTANEOUS
  Administered 2019-06-19: 8 [IU] via SUBCUTANEOUS
  Administered 2019-06-19 – 2019-06-20 (×3): 2 [IU] via SUBCUTANEOUS

## 2019-06-16 MED ORDER — BISACODYL 10 MG RE SUPP: 10.0000 mg | Freq: Every day | RECTAL | Status: DC | PRN

## 2019-06-16 MED ORDER — CHLORHEXIDINE GLUCONATE CLOTH 2 % EX PADS: 6.0000 | MEDICATED_PAD | Freq: Every day | CUTANEOUS | Status: DC

## 2019-06-16 MED ORDER — ASPIRIN EC 325 MG PO TBEC
325.0000 mg | DELAYED_RELEASE_TABLET | Freq: Every day | ORAL | Status: DC
  Administered 2019-06-16 – 2019-06-20 (×5): 325 mg via ORAL
  Filled 2019-06-16 (×4): qty 1

## 2019-06-16 MED ORDER — SODIUM CHLORIDE 0.9 % IV SOLN: 250.0000 mL | INTRAVENOUS | Status: DC | PRN

## 2019-06-16 MED ORDER — FUROSEMIDE 10 MG/ML IJ SOLN
20.0000 mg | Freq: Once | INTRAMUSCULAR | Status: AC
  Administered 2019-06-16: 20 mg via INTRAVENOUS
  Filled 2019-06-16: qty 2

## 2019-06-16 MED ORDER — OXYCODONE HCL 5 MG PO TABS
5.0000 mg | ORAL_TABLET | ORAL | Status: DC | PRN
  Administered 2019-06-16 – 2019-06-17 (×2): 10 mg via ORAL
  Filled 2019-06-16 (×2): qty 2

## 2019-06-16 MED ORDER — FUROSEMIDE 40 MG PO TABS
40.0000 mg | ORAL_TABLET | Freq: Every day | ORAL | Status: DC
  Administered 2019-06-17 – 2019-06-20 (×4): 40 mg via ORAL
  Filled 2019-06-16 (×4): qty 1

## 2019-06-16 MED ORDER — ONDANSETRON HCL 4 MG/2ML IJ SOLN: 4.0000 mg | Freq: Four times a day (QID) | INTRAMUSCULAR | Status: DC | PRN

## 2019-06-16 MED ORDER — METOCLOPRAMIDE HCL 5 MG/ML IJ SOLN: 10.0000 mg | Freq: Four times a day (QID) | INTRAMUSCULAR | Status: DC | PRN

## 2019-06-16 MED ORDER — POTASSIUM CHLORIDE CRYS ER 10 MEQ PO TBCR
10.0000 meq | EXTENDED_RELEASE_TABLET | Freq: Every day | ORAL | Status: DC
  Administered 2019-06-16 – 2019-06-20 (×5): 10 meq via ORAL
  Filled 2019-06-16 (×5): qty 1

## 2019-06-16 MED ORDER — SODIUM CHLORIDE 0.9% FLUSH
3.0000 mL | Freq: Two times a day (BID) | INTRAVENOUS | Status: DC
  Administered 2019-06-16 – 2019-06-19 (×8): 3 mL via INTRAVENOUS

## 2019-06-16 MED ORDER — SODIUM CHLORIDE 0.9% FLUSH: 10.0000 mL | Freq: Two times a day (BID) | INTRAVENOUS | Status: DC

## 2019-06-16 MED ORDER — GUAIFENESIN ER 600 MG PO TB12
600.0000 mg | ORAL_TABLET | Freq: Two times a day (BID) | ORAL | Status: DC | PRN
  Administered 2019-06-17 – 2019-06-18 (×2): 600 mg via ORAL
  Filled 2019-06-16 (×2): qty 1

## 2019-06-16 MED ORDER — ACETAMINOPHEN 325 MG PO TABS
650.0000 mg | ORAL_TABLET | Freq: Four times a day (QID) | ORAL | Status: DC | PRN
  Administered 2019-06-17: 650 mg via ORAL
  Filled 2019-06-16: qty 2

## 2019-06-16 NOTE — Progress Notes (Signed)
Patient O2sats 80% on room air.  Increased to 5L [place] with O2sats 91-93 and notified Dr. Servando Snare since patient had a bed on 4E.  Ok to transfer to 4E per Dr. Servando Snare.

## 2019-06-16 NOTE — Progress Notes (Signed)
CARDIAC REHAB PHASE I   PRE:  Rate/Rhythm: 101 ST    BP: sitting 114/65    SaO2: 87 RA, 92 5L  MODE:  Ambulation: 190 ft   POST:  Rate/Rhythm: 110 ST    BP: sitting 132/83     SaO2: 87 6L, up 91 6L, 97 4L resting  Upon arrival pts SAO2 low with RN, now on 5L. Ambulated with RW and 6L. Steady, slow pace. C/o dizziness after 100 ft, SAO2 87 6L. Brief rest with pursed lip breathing and up to 91 6L and able to finish walk. Return to recliner. After walking and IS (500 mL), pt sAO2 up to 95-97 4L at rest. Encouraged more walking and IS and recliner. Will f/u tomorrow. [place], ACSM 06/16/2019 1:37 PM

## 2019-06-16 NOTE — Progress Notes (Addendum)
Patient ID: Tonya Chandler, female   DOB: 15-Aug-1960, 59 y.o.   MRN: [id number] TCTS DAILY ICU PROGRESS NOTE                   [address]            [place] [address]          [phone]   2 Days Post-Op Procedure(s) (LRB): CORONARY ARTERY BYPASS GRAFTING (CABG) using LIMA to LAD; Endoscopic Right Greater Saphenous Vein Harvest: SVG to Circ (distal); SVG to RCA (distal). (N/A) TRANSESOPHAGEAL ECHOCARDIOGRAM (TEE) (N/A) Endovein Harvest Of Greater Saphenous Vein (Right)  Total Length of Stay:  LOS: 5 days   Subjective: Off and on nausea today. Walking around the unit.   Objective: Vital signs in last 24 hours: Temp:  [97.8 F (36.6 C)-99.5 F (37.5 C)] 97.9 F (36.6 C) (05/26 0350) Pulse Rate:  [94] 94 (05/25 2113) Cardiac Rhythm: Normal sinus rhythm (05/25 2000) Resp:  [6-25] 18 (05/26 0600) BP: (96-128)/(57-74) 121/70 (05/26 0600) SpO2:  [90 %-96 %] 96 % (05/26 0600) Arterial Line BP: (97-142)/(61-76) 142/76 (05/25 1000) Weight:  [70.3 kg] 70.3 kg (05/26 0600)  Filed Weights   06/13/19 0113 06/14/19 0335 06/16/19 0600  Weight: 71.2 kg 70.1 kg 70.3 kg    Weight change:    Hemodynamic parameters for last 24 hours: PAP: (30-33)/(25-26) 30/25  Intake/Output from previous day: 05/25 0701 - 05/26 0700 In: 1235.3 [P.O.:1220; I.V.:15.3] Out: [id number] [Urine:1515; Chest Tube:130]  Intake/Output this shift: Total I/O In: 500 [P.O.:500] Out: 930 [Urine:800; Chest Tube:130]  Current Meds: Scheduled Meds: . acetaminophen  1,000 mg Oral Q6H   Or  . acetaminophen (TYLENOL) oral liquid 160 mg/5 mL  1,000 mg Per Tube Q6H  . aspirin EC  325 mg Oral Daily   Or  . aspirin  324 mg Per Tube Daily  . atorvastatin  40 mg Oral q1800  . bisacodyl  10 mg Oral Daily   Or  . bisacodyl  10 mg Rectal Daily  . Chlorhexidine Gluconate Cloth  6 each Topical Daily  . cyclobenzaprine  10 mg Oral QHS  . docusate sodium  200 mg Oral Daily  . DULoxetine  60 mg Oral Daily  .  enoxaparin (LOVENOX) injection  30 mg Subcutaneous QHS  . estradiol  1 mg Oral QHS  . insulin aspart  0-24 Units Subcutaneous Q4H  . insulin detemir  15 Units Subcutaneous Daily  . metoprolol tartrate  12.5 mg Oral BID   Or  . metoprolol tartrate  12.5 mg Per Tube BID  . pantoprazole  40 mg Oral Daily  . sodium chloride flush  10-40 mL Intracatheter Q12H  . sodium chloride flush  3 mL Intravenous Q12H   Continuous Infusions: . sodium chloride Stopped (06/15/19 0525)  . dexmedetomidine (PRECEDEX) IV infusion Stopped (06/14/19 1521)  . insulin 2.8 mL/hr at 06/15/19 1000  . lactated ringers    . lactated ringers Stopped (06/15/19 0716)  . lactated ringers Stopped (06/15/19 0715)  . nitroGLYCERIN Stopped (06/15/19 0333)  . phenylephrine (NEO-SYNEPHRINE) Adult infusion Stopped (06/14/19 1710)   PRN Meds:.dextrose, famotidine, lactated ringers, loteprednol, metoprolol tartrate, morphine injection, ondansetron (ZOFRAN) IV, oxyCODONE, sodium chloride flush, sodium chloride flush, traMADol  General appearance: alert, cooperative and no distress Heart: regular rate and rhythm, S1, S2 normal, no murmur, click, rub or gallop Lungs: clear to auscultation bilaterally Abdomen: soft, non-tender; bowel sounds normal; no masses,  no organomegaly Extremities: extremities normal, atraumatic, no cyanosis  or edema Wound: Clean and dry covered with a sterile dressing  Lab Results: CBC: Recent Labs    06/15/19 1651 06/16/19 0439  WBC 18.1* 14.4*  HGB 11.6* 10.5*  HCT 35.2* 32.3*  PLT 251 226   BMET:  Recent Labs    06/15/19 1651 06/16/19 0439  NA 133* 133*  K 4.5 3.9  CL 97* 97*  CO2 25 29  GLUCOSE 193* 148*  BUN 7 <5*  CREATININE 0.55 0.45  CALCIUM 7.8* 8.0*    CMET: Lab Results  Component Value Date   WBC 14.4 (H) 06/16/2019   HGB 10.5 (L) 06/16/2019   HCT 32.3 (L) 06/16/2019   PLT 226 06/16/2019   GLUCOSE 148 (H) 06/16/2019   CHOL 216 (H) 09/30/2018   TRIG 421 (H)  09/30/2018   HDL 58 09/30/2018   LDLCALC  09/30/2018   ALT 13 06/14/2019   AST 32 06/14/2019   NA 133 (L) 06/16/2019   K 3.9 06/16/2019   CL 97 (L) 06/16/2019   CREATININE 0.45 06/16/2019   BUN <5 (L) 06/16/2019   CO2 29 06/16/2019   INR 1.2 06/14/2019   HGBA1C 7.7 (H) 06/13/2019      PT/INR:  Recent Labs    06/14/19 1330  LABPROT 15.0  INR 1.2   Radiology: No results found.   Assessment/Plan: S/P Procedure(s) (LRB): CORONARY ARTERY BYPASS GRAFTING (CABG) using LIMA to LAD; Endoscopic Right Greater Saphenous Vein Harvest: SVG to Circ (distal); SVG to RCA (distal). (N/A) TRANSESOPHAGEAL ECHOCARDIOGRAM (TEE) (N/A) Endovein Harvest Of Greater Saphenous Vein (Right)   1. CV- NSR in the 90s, BP well controlled. No drips. Continue ASA, statin, and BB 2. Pulm-tolerating 4L [place]. CXR reviewed and stable. Encouraged incentive spirometer 3. Renal-creatinine 0.45, weight is baseline 4. H and H 10.5/32.2, expected acute blood loss anemia 5. Endo- a few elevated CBGs but will hold off on titration for now due to not eating a full diet.  6. Post-op nausea-added Reglan. On zofran. Continue BRAT diet.   Plan: Ambulating around the unit without issue. Will remove chest tube and central line. Possible transfer to the floor today.   Nicholes Rough, PA-C Plan transfer to step down ,     [name] 06/16/2019 6:46 AM

## 2019-06-16 NOTE — Hospital Course (Signed)
HPI:   [name] is a 59 year old female patient with a past medical history significant for diabetes mellitus type 2 since age 22 , dyslipidemia, hypertension, Depression/Anxiety, Obesity,  history of GI bleed, and known coronary artery disease that presents outpatient to cardiology with a atypical chest pain that happens at rest and also can happen with exercise.  She describes the pain as a pressure and heavy sensation that lasts for a few minutes and also radiates to the neck as well as the shoulder.  She had a cardiac catheterization done about 10 years ago which showed mild disease.  She had a stress test done by Dr. Hamilton Capri which showed significant abnormalities after only walking 5 minutes on the treadmill.  She became short of breath and had significant ST segment depression.  Was recommended that she undergo cardiac catheterization for further evaluation.  This morning she had a left heart catheterization which showed estimated left ventricular ejection fraction of 55 to 65%, a proximal RCA lesion of 99%, ostial left main lesion of 40%, distal circumflex lesion of 100%, proximal LAD lesion of 95% stenosis and mid LAD lesion of 40% stenosis.  Due to her three-vessel coronary artery disease and diabetic status CABG was recommended.  An echocardiogram has been ordered but is not yet done.   The patient shares that she was working in [employer] and traveling only a few days ago and felt fine. Her CAD was a shock, however her brother recently passed away from ischemic bowel/ sepsis s/p redo CABG. He was 61 years old at the time of death. His first CABG was done when he was 61 years old. We discussed her CAD being related to her uncontrolled diabetes and strong family history.    Hospital Course:   [name] is a 59 year old female patient who underwent a coronary bypass bypass grafting x3 with Dr. Servando Snare on 06/14/2019.  She tolerated seizure well and she was transferred to the surgical ICU for  continued care.  She was extubated in a timely manner.  Postop day 1 we discontinued the patient's chest tubes and lines.  We initiated a diuretic regimen for fluid overload.  We worked on glucose control since she is an uncontrolled type II diabetic.  Postop day 2 she remained in normal sinus rhythm and her blood pressure was well controlled on no drips.  She remained on 4 L nasal cannula which we were weaning as tolerated.  She did have some intermittent nausea therefore we added Reglan.  We discontinued her remaining chest tube and central line.  She is stable for transfer to the stepdown unit.

## 2019-06-16 NOTE — Progress Notes (Signed)
CT surgery p.m. Rounds  Patient's problems with oxygenation improved with ambulation and pulmonary toilet Scattered wheezes on exam-we will give 1 dose of IV Lasix 20 mg Start Dulera MDI Chest x-ray pending in a.m.

## 2019-06-16 NOTE — Progress Notes (Addendum)
Received pt from [place] to 4e04. VSS, 4L O2 to maintain sats above 90%. CHG completed. Tele verified x2. Oriented to room and call light. Report given to primary nurse.

## 2019-06-17 ENCOUNTER — Inpatient Hospital Stay (HOSPITAL_COMMUNITY): Payer: BC Managed Care – PPO

## 2019-06-17 DIAGNOSIS — J90 Pleural effusion, not elsewhere classified: Secondary | ICD-10-CM | POA: Diagnosis not present

## 2019-06-17 LAB — BASIC METABOLIC PANEL
Anion gap: 12 (ref 5–15)
BUN: 6 mg/dL (ref 6–20)
CO2: 27 mmol/L (ref 22–32)
Calcium: 8 mg/dL — ABNORMAL LOW (ref 8.9–10.3)
Chloride: 94 mmol/L — ABNORMAL LOW (ref 98–111)
Creatinine, Ser: 0.44 mg/dL (ref 0.44–1.00)
GFR calc Af Amer: >60 mL/min (ref >60)
GFR calc non Af Amer: >60 mL/min (ref >60)
Glucose, Bld: 154 mg/dL — ABNORMAL HIGH (ref 70–99)
Potassium: 3.7 mmol/L (ref 3.5–5.1)
Sodium: 133 mmol/L — ABNORMAL LOW (ref 135–145)

## 2019-06-17 LAB — CBC
HCT: 30.8 % — ABNORMAL LOW (ref 36.0–46.0)
Hemoglobin: 10 g/dL — ABNORMAL LOW (ref 12.0–15.0)
MCH: 29.9 pg (ref 26.0–34.0)
MCHC: 32.5 g/dL (ref 30.0–36.0)
MCV: 92.2 fL (ref 80.0–100.0)
Platelets: 231 10*3/uL (ref 150–400)
RBC: 3.34 MIL/uL — ABNORMAL LOW (ref 3.87–5.11)
RDW: 13.3 % (ref 11.5–15.5)
WBC: 13.1 10*3/uL — ABNORMAL HIGH (ref 4.0–10.5)
nRBC: 0.2 % (ref 0.0–0.2)

## 2019-06-17 LAB — TYPE AND SCREEN
ABO/RH(D): O POS
Antibody Screen: NEGATIVE
Unit division: 0
Unit division: 0
Unit division: 0
Unit division: 0

## 2019-06-17 LAB — GLUCOSE, CAPILLARY
Glucose-Capillary: 150 mg/dL — ABNORMAL HIGH (ref 70–99)
Glucose-Capillary: 173 mg/dL — ABNORMAL HIGH (ref 70–99)
Glucose-Capillary: 178 mg/dL — ABNORMAL HIGH (ref 70–99)
Glucose-Capillary: 201 mg/dL — ABNORMAL HIGH (ref 70–99)

## 2019-06-17 LAB — BPAM RBC
Blood Product Expiration Date: 202106182359
Blood Product Expiration Date: 202106182359
Blood Product Expiration Date: 202106182359
Blood Product Expiration Date: 202106182359
ISSUE DATE / TIME: 202105200826
ISSUE DATE / TIME: 202105240957
ISSUE DATE / TIME: 202105261707
ISSUE DATE / TIME: 202105262000
Unit Type and Rh: 5100
Unit Type and Rh: 5100
Unit Type and Rh: 5100
Unit Type and Rh: 5100

## 2019-06-17 MED ORDER — INSULIN DETEMIR 100 UNIT/ML ~~LOC~~ SOLN
17.0000 [IU] | Freq: Every day | SUBCUTANEOUS | Status: DC
  Administered 2019-06-17: 17 [IU] via SUBCUTANEOUS
  Filled 2019-06-17 (×2): qty 0.17

## 2019-06-17 MED FILL — Sodium Chloride IV Soln 0.9%: INTRAVENOUS | Qty: 2000 | Status: AC

## 2019-06-17 MED FILL — Heparin Sodium (Porcine) Inj 1000 Unit/ML: INTRAMUSCULAR | Qty: 10 | Status: AC

## 2019-06-17 MED FILL — Mannitol IV Soln 20%: INTRAVENOUS | Qty: 500 | Status: AC

## 2019-06-17 MED FILL — Lidocaine HCl Local Soln Prefilled Syringe 100 MG/5ML (2%): INTRAMUSCULAR | Qty: 5 | Status: AC

## 2019-06-17 MED FILL — Electrolyte-R (PH 7.4) Solution: INTRAVENOUS | Qty: 4000 | Status: AC

## 2019-06-17 MED FILL — Sodium Bicarbonate IV Soln 8.4%: INTRAVENOUS | Qty: 50 | Status: AC

## 2019-06-17 NOTE — Progress Notes (Signed)
Mobility Specialist - Progress Note   06/17/19 1540  Mobility  Activity Ambulated in hall  Level of Assistance Independent  Assistive Device None  Distance Ambulated (ft) 510 ft  Mobility Response Tolerated well  Mobility performed by Mobility specialist  $Mobility charge 1 Mobility    Pre-mobility: 96 HR, 96% SpO2 During mobility: 118 HR, 98% SpO2 Post-mobility: 110 HR, 96% SPO2  Pt was walked on 2L/min and O2 remained stable. She denied any SOB, dizziness, or lightheadedness while ambulating. I carried the RW with [place] in case we needed it. She was able to stand w/o assistance and followed SP correctly w/o any prompts. I left her sitting in her chair with the call bell in her lap, her family was in the room.   [place] Specialist

## 2019-06-17 NOTE — Progress Notes (Signed)
CARDIAC REHAB PHASE I   PRE:  Rate/Rhythm: SR 93  BP:  Sitting: 124/85        SaO2: 97 % (3 L/Min)  MODE:  Ambulation: 260 ft   POST:  Rate/Rhythm: SR  94  BP:  Sitting: 136/80        SaO2: 99% (2L/min)  1015 - 1045  Pt received in chair, agrees to walk. Voices she feels like she "over-did-it" on her walk this morning. Pt ambulates with steady gait using rolling walker. We weaned O2 to 2L/min for walk but the pt desaturated to 88%-91%. Increased to 3L and SaO2 improved to 92%-95%. Pt at no time c/o SOB or any other symptoms. Pt back to chair with call bell at side. I/S encouraged 10x/hr.I/S = 524mL. After walk, pt left on 2L/min and primary RN was in room and notified of the change.   Lesly Rubenstein, MS, ACSM EP-C, [facility] 06/17/2019  10:37 AM

## 2019-06-17 NOTE — Progress Notes (Addendum)
      [address]       [place] [address]             [phone]      3 Days Post-Op Procedure(s) (LRB): CORONARY ARTERY BYPASS GRAFTING (CABG) using LIMA to LAD; Endoscopic Right Greater Saphenous Vein Harvest: SVG to Circ (distal); SVG to RCA (distal). (N/A) TRANSESOPHAGEAL ECHOCARDIOGRAM (TEE) (N/A) Endovein Harvest Of Greater Saphenous Vein (Right) Subjective: Nausea is better but does not like the food  Objective: Vital signs in last 24 hours: Temp:  [98 F (36.7 C)-98.7 F (37.1 C)] 98.7 F (37.1 C) (05/27 0359) Pulse Rate:  [90-103] 94 (05/27 0359) Cardiac Rhythm: Normal sinus rhythm (05/27 0359) Resp:  [15-18] 16 (05/27 0359) BP: (105-132)/(65-82) 118/76 (05/27 0359) SpO2:  [90 %-95 %] 94 % (05/27 0359) Weight:  [74.4 kg-74.5 kg] 74.4 kg (05/27 0352)     Intake/Output from previous day: 05/26 0701 - 05/27 0700 In: 963 [P.O.:960; I.V.:3] Out: 1650 [Urine:1650] Intake/Output this shift: No intake/output data recorded.  General appearance: alert, cooperative and no distress Heart: sinus tachycardia Lungs: clear to auscultation bilaterally Abdomen: soft, non-tender; bowel sounds normal; no masses,  no organomegaly Extremities: extremities normal, atraumatic, no cyanosis or edema Wound: clean and dry  Lab Results: Recent Labs    06/16/19 0439 06/17/19 0244  WBC 14.4* 13.1*  HGB 10.5* 10.0*  HCT 32.3* 30.8*  PLT 226 231   BMET:  Recent Labs    06/16/19 0439 06/17/19 0244  NA 133* 133*  K 3.9 3.7  CL 97* 94*  CO2 29 27  GLUCOSE 148* 154*  BUN <5* 6  CREATININE 0.45 0.44  CALCIUM 8.0* 8.0*    PT/INR:  Recent Labs    06/14/19 1330  LABPROT 15.0  INR 1.2   ABG    Component Value Date/Time   PHART 7.321 (L) 06/15/2019 0052   HCO3 27.4 06/15/2019 0052   TCO2 29 06/15/2019 0052   O2SAT 92.0 06/15/2019 0052   CBG (last 3)  Recent Labs    06/16/19 1626 06/16/19 2119 06/17/19 0632  GLUCAP 171* 179* 150*     Assessment/Plan: S/P Procedure(s) (LRB): CORONARY ARTERY BYPASS GRAFTING (CABG) using LIMA to LAD; Endoscopic Right Greater Saphenous Vein Harvest: SVG to Circ (distal); SVG to RCA (distal). (N/A) TRANSESOPHAGEAL ECHOCARDIOGRAM (TEE) (N/A) Endovein Harvest Of Greater Saphenous Vein (Right)  1. CV- BP well controlled, NSR in the 90s-100s 2. Pulm-tolerating 3L with good oxygen saturation 3. Renal-creatinine 0.44, electrolytes okay. Continue inhalers 4. H and H 10.0/3.34, expected acute blood loss anemia 6. Endo-Increase Levemir to 17U, continue to monitor blood glucose  Plan: Weaning oxygen today. Will titrate BB as able for some sinus tachycardia. Ambulate in the halls today. Hopefully home by the weekend.    LOS: 6 days    Tonya Chandler 06/17/2019 On statin and lasix  Pa lat chest xray today - improved  Renal function stable  Convert back to po diabetic meds tomorrow if taking diet well Poss home 2 days  I have seen and examined Tonya Chandler and agree with the above assessment  and plan.  [name] MD [place] [phone] Office [phone] 06/17/2019 8:22 AM

## 2019-06-17 NOTE — Plan of Care (Signed)
  Problem: Health Behavior/Discharge Planning: Goal: Ability to manage health-related needs will improve Outcome: Progressing   Problem: Activity: Goal: Risk for activity intolerance will decrease Outcome: Progressing   

## 2019-06-17 NOTE — Discharge Summary (Signed)
Physician Discharge Summary  Patient ID: Tonya Chandler MRN: [id number] DOB/AGE: 1960-03-16 59 y.o.  Admit date: 06/11/2019 Discharge date: 06/20/2019  Admission Diagnoses:  Patient Active Problem List   Diagnosis Date Noted  . Unstable angina ([place]) 06/11/2019  . Angina pectoris ([place]) 06/04/2019  . Abnormal stress test 05/24/2019  . Radiculopathy 04/02/2018  . Abnormal ECG 04/01/2018  . Radiculitis of right cervical region 01/20/2018  . Carpal tunnel syndrome, right 12/16/2017  . Right hand pain 06/24/2017  . Impingement syndrome of shoulder, right 06/24/2017  . Dupuytren's contracture of right hand 06/24/2017  . Bilateral carpal tunnel syndrome 05/23/2017  . Depression, major, in partial remission ([place]) 07/14/2015  . RLS (restless legs syndrome) 07/14/2015  . Atherosclerosis of native coronary artery with angina pectoris ([place]) 05/10/2015  . Musculoskeletal chest pain 05/04/2015  . Precordial pain 05/04/2015  . Generalized anxiety disorder 03/14/2015  . Primary insomnia 03/14/2015  . Midline low back pain with left-sided sciatica 12/29/2013  . Type 2 diabetes mellitus with diabetic neuropathy, with long-term current use of insulin ([place]) 02/16/2010  . Hyperlipidemia LDL goal <70 02/16/2010  . DEPRESSION 02/16/2010  . Essential hypertension 02/16/2010  . GERD 02/16/2010  . Bilateral knee pain 02/16/2010    Discharge Diagnoses:  Active Problems:   Unstable angina ([facility])   S/P CABG x 3   Discharged Condition: good  Hospital Course:   On 5/24 Ms. Heringer underwent a CABG x 3 with Dr. Servando Snare. She tolerated the procedure well and was transferred to the surgical ICU for continued care. She was extubated in a timely manner. POD 1 she was in normal sinus rhythm and tolerating 4L [place]. She did have some post-op nausea which resolved. She was ambulating around the unit without an issue. Chest tube removed POD 2 as well as the central line. Gave a few doses of lasix for fluid overload. POD 3  we continued to wean her oxygen as tolerated. All lines and tubes were removed. We increased her Levemir for better blood glucose control. POD 4 we changed her back to her oral diabetes medications. We continued to wean her oxygen as tolerated. Today, she is ambulating with limited assistance, tolerating room air, her incisions are healing well, and she is ready for discharge.   Consults: None  Significant Diagnostic Studies:  CLINICAL DATA:  Post op  EXAM: CHEST - 2 VIEW  COMPARISON:  06/16/2019  FINDINGS: Removal of LEFT chest tube.  No appreciable pneumothorax.  Sternotomy wires overlie normal cardiac silhouette. Normal pulmonary vasculature. No effusion, infiltrate, or pneumothorax. No acute osseous abnormality. Sternotomy wires overlie stable large cardiac silhouette. Low lung volumes. No pulmonary edema.  Small effusion effusions present on lateral projection.  IMPRESSION: No complication following chest tube removal.   Electronically Signed   By: Suzy Bouchard M.D.   On: 06/17/2019 08:44   Treatments:   NAME: Tonya, Chandler MEDICAL RECORD [id number] ACCOUNT [id number] DATE OF BIRTH:March 29, 1960 FACILITY: [facility] LOCATION: [facility] PHYSICIAN:EDWARD Tonya Bun, MD  OPERATIVE REPORT  DATE OF PROCEDURE:  06/14/2019  PREOPERATIVE DIAGNOSIS:  Coronary occlusive disease with unstable angina.  POSTOPERATIVE DIAGNOSIS:  Coronary occlusive disease with unstable angina.  SURGICAL PROCEDURE:  Coronary artery bypass grafting x3 with the left internal mammary to the left anterior descending coronary artery, sequential reverse saphenous vein graft to the distal right coronary artery and distal circumflex coronary artery with  right greater saphenous thigh endoscopic vein harvesting.  SURGEON:  Lanelle Bal, MD  FIRST ASSISTANT:  Ellwood Handler, PA-C   Discharge  Exam: Blood pressure 125/61, pulse 90, temperature 98.1 F (36.7 C), temperature source Oral,  resp. rate 15, height 5' (1.524 m), weight 67.6 kg, SpO2 94 %.   General appearance: alert, cooperative and no distress Heart: sinus tachycardia Lungs: clear to auscultation bilaterally Abdomen: soft, non-tender; bowel sounds normal; no masses,  no organomegaly Extremities: extremities normal, atraumatic, no cyanosis or edema Wound: clean and dry  Disposition: Discharge disposition: 01-Home or Self Care        Allergies as of 06/20/2019      Reactions   Adhesive [tape] Hives   Welts--PLEASE USE HYPO ALLERGIC TAPE   Ampicillin Diarrhea   Did it involve swelling of the face/tongue/throat, SOB, or low BP? No Did it involve sudden or severe rash/hives, skin peeling, or any reaction on the inside of your mouth or nose? No Did you need to seek medical attention at a hospital or doctor's office? No When did it last happen?30-40 years ago If all above answers are "NO", may proceed with cephalosporin use.   Cefprozil Other (See Comments)   Acid reflux/indigestion issues   Meloxicam Other (See Comments)   Acid reflux/indigestion issues      Medication List    STOP taking these medications   nitroGLYCERIN 0.4 MG SL tablet Commonly known as: NITROSTAT   trandolapril-verapamil 2-240 MG tablet Commonly known as: TARKA   triamterene-hydrochlorothiazide 37.5-25 MG tablet Commonly known as: MAXZIDE-25     TAKE these medications   acetaminophen 325 MG tablet Commonly known as: TYLENOL Take 2 tablets (650 mg total) by mouth every 6 (six) hours as needed for mild pain.   aspirin 325 MG EC tablet Take 1 tablet (325 mg total) by mouth daily. What changed:   medication strength  how much to take   atorvastatin 40 MG tablet Commonly known as: LIPITOR Take 1 tablet (40 mg total) by mouth daily.   Basaglar KwikPen 100 UNIT/ML Inject 0.2 mLs (20 Units total) into the skin daily. What changed: how much to take   cyclobenzaprine 10 MG tablet Commonly known as:  FLEXERIL Take 1 tablet (10 mg total) by mouth at bedtime.   DULoxetine 60 MG capsule Commonly known as: CYMBALTA Take 1 capsule (60 mg total) by mouth daily.   esomeprazole 20 MG capsule Commonly known as: NEXIUM Take 40 mg by mouth daily at 12 noon.   estradiol 1 MG tablet Commonly known as: ESTRACE Take 1 mg by mouth at bedtime.   Famotidine 20 MG Chew Chew 20 mg by mouth daily as needed (reflux).   gabapentin 800 MG tablet Commonly known as: Neurontin 1 tab in the morning, 1 tab midday, 2 tabs at bedtime What changed:   how much to take  how to take this  when to take this  additional instructions   HumaLOG KwikPen 200 UNIT/ML KwikPen Generic drug: insulin lispro Inject 5 units as directed prior to lunch and dinner What changed: additional instructions   Invokana 300 MG Tabs tablet Generic drug: canagliflozin Take 1 tablet (300 mg total) by mouth daily.   loteprednol 0.5 % ophthalmic suspension Commonly known as: LOTEMAX Place 1 drop into the right eye 4 (four) times daily.   magnesium oxide 400 MG tablet Commonly known as: MAG-OX Take 800 mg by mouth at bedtime.   metoprolol tartrate 25 MG tablet Commonly known as: LOPRESSOR Take 1 tablet (25 mg total) by mouth 2 (two) times daily.   mirtazapine 45 MG tablet Commonly known as: REMERON Take 45 mg by  mouth at bedtime.   oxyCODONE 5 MG immediate release tablet Commonly known as: Oxy IR/ROXICODONE Take 1 tablet (5 mg total) by mouth every 6 (six) hours as needed for severe pain.   rOPINIRole 0.5 MG tablet Commonly known as: REQUIP Take 3 tablets (1.5 mg total) by mouth at bedtime.   Trulicity 1.5 0000000 Sopn Generic drug: Dulaglutide Inject 1.5 mg into the skin every Sunday.   Vitamin B-12 2500 MCG Subl Place 2,500 mcg under the tongue daily.      Follow-up Information    Emeterio Reeve, DO. Call in 1 day(s).   Specialty: Osteopathic Medicine Contact information: [id number]  [place] [address] [phone]        Park Liter, MD .   Specialty: Cardiology Contact information: [place] [address] [phone]        [name], MD Follow up.   Specialty: Cardiothoracic Surgery Why: Your follow-up appointment is on 7/1 at 9:30am. Please arrive at 9am for a chest xray located at [facility] imaging which is on the first floor of our building.  Contact information: [place] [address] [place] [address] [phone]          The patient has been discharged on:   1.Beta Blocker:  Yes [ yes  ]                              No   [   ]                              If No, reason:  2.Ace Inhibitor/ARB: Yes [   ]                                     No  [ no   ]                                     If No, reason:  3.Statin:   Yes [ yes  ]                  No  [   ]                  If No, reason:  4.Ecasa:  Yes  [  yes ]                  No   [   ]                  If No, reason:    Signed: Elgie Collard 06/20/2019, 7:59 AM

## 2019-06-18 LAB — GLUCOSE, CAPILLARY
Glucose-Capillary: 158 mg/dL — ABNORMAL HIGH (ref 70–99)
Glucose-Capillary: 156 mg/dL — ABNORMAL HIGH (ref 70–99)
Glucose-Capillary: 124 mg/dL — ABNORMAL HIGH (ref 70–99)
Glucose-Capillary: 154 mg/dL — ABNORMAL HIGH (ref 70–99)

## 2019-06-18 MED ORDER — CANAGLIFLOZIN 300 MG PO TABS
300.0000 mg | ORAL_TABLET | Freq: Every day | ORAL | Status: DC
  Administered 2019-06-18 – 2019-06-20 (×3): 300 mg via ORAL
  Filled 2019-06-18 (×3): qty 1

## 2019-06-18 NOTE — Progress Notes (Signed)
Epicardial pacing wires removed per MD order without difficulty.  Will continue to monitor.

## 2019-06-18 NOTE — Progress Notes (Addendum)
      [address]       [place] [address]             [phone]      4 Days Post-Op Procedure(s) (LRB): CORONARY ARTERY BYPASS GRAFTING (CABG) using LIMA to LAD; Endoscopic Right Greater Saphenous Vein Harvest: SVG to Circ (distal); SVG to RCA (distal). (N/A) TRANSESOPHAGEAL ECHOCARDIOGRAM (TEE) (N/A) Endovein Harvest Of Greater Saphenous Vein (Right) Subjective: Feels good this morning. Does not like the food here.   Objective: Vital signs in last 24 hours: Temp:  [98 F (36.7 C)-98.8 F (37.1 C)] 98.7 F (37.1 C) (05/28 0414) Pulse Rate:  [91-115] 92 (05/28 0414) Cardiac Rhythm: Normal sinus rhythm;Sinus tachycardia (05/27 2000) Resp:  [13-22] 17 (05/28 0414) BP: (99-139)/(57-85) 129/71 (05/28 0414) SpO2:  [91 %-98 %] 94 % (05/28 0414) Weight:  [73.1 kg] 73.1 kg (05/28 0414)     Intake/Output from previous day: 05/27 0701 - 05/28 0700 In: 120 [P.O.:120] Out: 2600 [Urine:2600] Intake/Output this shift: No intake/output data recorded.  General appearance: alert, cooperative and no distress Heart: regular rate and rhythm, S1, S2 normal, no murmur, click, rub or gallop Lungs: clear to auscultation bilaterally Abdomen: soft, non-tender; bowel sounds normal; no masses,  no organomegaly Extremities: extremities normal, atraumatic, no cyanosis or edema Wound: clean and dry  Lab Results: Recent Labs    06/16/19 0439 06/17/19 0244  WBC 14.4* 13.1*  HGB 10.5* 10.0*  HCT 32.3* 30.8*  PLT 226 231   BMET:  Recent Labs    06/16/19 0439 06/17/19 0244  NA 133* 133*  K 3.9 3.7  CL 97* 94*  CO2 29 27  GLUCOSE 148* 154*  BUN <5* 6  CREATININE 0.45 0.44  CALCIUM 8.0* 8.0*    PT/INR: No results for input(s): LABPROT, INR in the last 72 hours. ABG    Component Value Date/Time   PHART 7.321 (L) 06/15/2019 0052   HCO3 27.4 06/15/2019 0052   TCO2 29 06/15/2019 0052   O2SAT 92.0 06/15/2019 0052   CBG (last 3)  Recent Labs    06/17/19 1610  06/17/19 2114 06/18/19 0608  GLUCAP 178* 201* 158*    Assessment/Plan: S/P Procedure(s) (LRB): CORONARY ARTERY BYPASS GRAFTING (CABG) using LIMA to LAD; Endoscopic Right Greater Saphenous Vein Harvest: SVG to Circ (distal); SVG to RCA (distal). (N/A) TRANSESOPHAGEAL ECHOCARDIOGRAM (TEE) (N/A) Endovein Harvest Of Greater Saphenous Vein (Right)  1. CV- BP well controlled, NSR in the 90s 2. Pulm-tolerating 3L with good oxygen saturation 3. Renal-creatinine 0.44, electrolytes okay. Continue inhalers 4. H and H 10.0/3.34, expected acute blood loss anemia 6. Endo-Will restart Invokana, was also on trulicity but does not appear that we carry that medication. She takes every Sunday. Discontinue Levemir. Keep SSI.   Plan: Weaning oxygen today.  Ambulate in the halls today. Hopefully home by the weekend. Okay to remove EPW   LOS: 7 days    Tonya Chandler 06/18/2019  o2 sats 85 on room air after left o2 off when returning from bathroom- placed back on 02 sat 95 Home when off o2 , follow up chest xray in am I have seen and examined Tonya Chandler and agree with the above assessment  and plan.  Tonya Isaac MD Beeper [phone] Office [phone] 06/18/2019 8:50 AM

## 2019-06-18 NOTE — Progress Notes (Signed)
Discussed sternal precautions, IS, exercise, diet, and CRPII. Pt receptive. She is working hard to mobilize and practice IS and sit up. Nose bleed this afternoon made her tired. Plans to walk with mobility team then to bed. Will refer to [facility]. [place], ACSM 2:14 PM 06/18/2019

## 2019-06-18 NOTE — Progress Notes (Signed)
CARDIAC REHAB PHASE I   PRE:  Rate/Rhythm: 95 SR    BP: sitting 153/90    SaO2: 97 3L, 96 1L  MODE:  Ambulation: 430 ft   POST:  Rate/Rhythm: 110 ST    BP: sitting to bathroom     SaO2: 87 2L  Pt doing very well except still requiring increased O2. Attempted to ambulate on 1L but down to 83 1L after 210 ft. Increased to 2L for rest of walk and SaO2 87 2L after walk. Up to 91 with pursed lip breathing. Continue to encouraged IS, walking, recliner.  [id number]   [place], ACSM 06/18/2019 10:23 AM

## 2019-06-18 NOTE — Progress Notes (Signed)
Mobility Specialist - Progress Note   06/18/19 1427  Mobility  Activity Ambulated in hall  Level of Assistance Independent  Assistive Device None  Distance Ambulated (ft) 510 ft  Mobility Response Tolerated well  Mobility performed by Mobility specialist  $Mobility charge 1 Mobility    Pre-mobility: 90 HR, 91% SpO2 During mobility: 110 HR, 88% SpO2 Post-mobility: 103 HR, 91% SPO2  Pt was ambulated independently on 1 L/min of O2, SpO2 stayed between 88-89% when checked. She said she felt fatigued today due to the stress of her nose bleed earlier this am. She was able to hold conversation well while walking and expressed understanding of her education given to her by cardiac rehab. Pt seemed motivated to pursue lifestyle changes. I left her in her bed with her call bell and phone at her side.   [place] Specialist

## 2019-06-18 NOTE — Discharge Instructions (Signed)

## 2019-06-19 ENCOUNTER — Inpatient Hospital Stay (HOSPITAL_COMMUNITY): Payer: BC Managed Care – PPO

## 2019-06-19 DIAGNOSIS — J9811 Atelectasis: Secondary | ICD-10-CM | POA: Diagnosis not present

## 2019-06-19 DIAGNOSIS — Z951 Presence of aortocoronary bypass graft: Secondary | ICD-10-CM

## 2019-06-19 DIAGNOSIS — J90 Pleural effusion, not elsewhere classified: Secondary | ICD-10-CM | POA: Diagnosis not present

## 2019-06-19 LAB — GLUCOSE, CAPILLARY
Glucose-Capillary: 109 mg/dL — ABNORMAL HIGH (ref 70–99)
Glucose-Capillary: 153 mg/dL — ABNORMAL HIGH (ref 70–99)
Glucose-Capillary: 149 mg/dL — ABNORMAL HIGH (ref 70–99)
Glucose-Capillary: 224 mg/dL — ABNORMAL HIGH (ref 70–99)

## 2019-06-19 MED ORDER — METOPROLOL TARTRATE 25 MG PO TABS
25.0000 mg | ORAL_TABLET | Freq: Two times a day (BID) | ORAL | Status: DC
  Administered 2019-06-19 – 2019-06-20 (×2): 25 mg via ORAL
  Filled 2019-06-19 (×2): qty 1

## 2019-06-19 NOTE — Plan of Care (Signed)
  Problem: Health Behavior/Discharge Planning: Goal: Ability to manage health-related needs will improve Outcome: Progressing   Problem: Clinical Measurements: Goal: Respiratory complications will improve Outcome: Progressing   

## 2019-06-19 NOTE — Progress Notes (Addendum)
      [address]       [place] [address]             [phone]      5 Days Post-Op Procedure(s) (LRB): CORONARY ARTERY BYPASS GRAFTING (CABG) using LIMA to LAD; Endoscopic Right Greater Saphenous Vein Harvest: SVG to Circ (distal); SVG to RCA (distal). (N/A) TRANSESOPHAGEAL ECHOCARDIOGRAM (TEE) (N/A) Endovein Harvest Of Greater Saphenous Vein (Right) Subjective: Feels good this morning. Getting stronger every day.   Objective: Vital signs in last 24 hours: Temp:  [98 F (36.7 C)-99 F (37.2 C)] 98 F (36.7 C) (05/29 0354) Pulse Rate:  [87-103] 101 (05/29 0631) Cardiac Rhythm: Normal sinus rhythm;Sinus tachycardia (05/29 0755) Resp:  [12-26] 18 (05/29 0631) BP: (119-152)/(71-83) 137/83 (05/29 0354) SpO2:  [89 %-95 %] 95 % (05/29 0802) Weight:  [70.5 kg] 70.5 kg (05/29 0618)     Intake/Output from previous day: 05/28 0701 - 05/29 0700 In: 240 [P.O.:240] Out: 2500 [Urine:2500] Intake/Output this shift: No intake/output data recorded.  General appearance: alert, cooperative and no distress Heart: sinus tachycardia Lungs: clear to auscultation bilaterally Abdomen: soft, non-tender; bowel sounds normal; no masses,  no organomegaly Extremities: extremities normal, atraumatic, no cyanosis or edema Wound: clean and dry  Lab Results: Recent Labs    06/17/19 0244  WBC 13.1*  HGB 10.0*  HCT 30.8*  PLT 231   BMET:  Recent Labs    06/17/19 0244  NA 133*  K 3.7  CL 94*  CO2 27  GLUCOSE 154*  BUN 6  CREATININE 0.44  CALCIUM 8.0*    PT/INR: No results for input(s): LABPROT, INR in the last 72 hours. ABG    Component Value Date/Time   PHART 7.321 (L) 06/15/2019 0052   HCO3 27.4 06/15/2019 0052   TCO2 29 06/15/2019 0052   O2SAT 92.0 06/15/2019 0052   CBG (last 3)  Recent Labs    06/18/19 1652 06/18/19 2103 06/19/19 0621  GLUCAP 124* 154* 109*    Assessment/Plan: S/P Procedure(s) (LRB): CORONARY ARTERY BYPASS GRAFTING (CABG)  using LIMA to LAD; Endoscopic Right Greater Saphenous Vein Harvest: SVG to Circ (distal); SVG to RCA (distal). (N/A) TRANSESOPHAGEAL ECHOCARDIOGRAM (TEE) (N/A) Endovein Harvest Of Greater Saphenous Vein (Right)  1. CV- BP well controlled, NSR in the 90s-ST in the 100s. Will increase BB  2. Pulm-tolerating 1L with good oxygen saturation, CXR is stable without any large pleural effusions or pneumo 3. Renal-creatinine 0.44, electrolytes okay. Continue inhalers 4. H and H 10.0/3.34, expected acute blood loss anemia 6. Endo-Will restart Invokana, Keep SSI.  7. + bowel movement, appetite has improved  Plan: Continue to wean oxygen. Likely can go home tomorrow.    LOS: 8 days    Elgie Collard 06/19/2019  Looks great today 1 L nasal cannula Likely home tomorrow  Lajuana Matte

## 2019-06-19 NOTE — Progress Notes (Signed)
CARDIAC REHAB PHASE I   1325 CR RN in to ambulate with patient. Patient refused at this time secondary to visitors just arrived. Patient encouraged to ambulate later today in hallway. Verbalized understanding.   Dam Ashraf Minus Breeding RN, BSN

## 2019-06-19 NOTE — Progress Notes (Signed)
Pt transitioned from 0.5 [place] to room air.  O2 saturation 94%.  Will continue to monitor.

## 2019-06-20 LAB — GLUCOSE, CAPILLARY: Glucose-Capillary: 159 mg/dL — ABNORMAL HIGH (ref 70–99)

## 2019-06-20 MED ORDER — ASPIRIN 325 MG PO TBEC: 325.0000 mg | DELAYED_RELEASE_TABLET | Freq: Every day | ORAL | 0 refills | Status: DC

## 2019-06-20 MED ORDER — OXYCODONE HCL 5 MG PO TABS: 5.0000 mg | ORAL_TABLET | Freq: Four times a day (QID) | ORAL | 0 refills | Status: DC | PRN

## 2019-06-20 MED ORDER — BASAGLAR KWIKPEN 100 UNIT/ML ~~LOC~~ SOPN: 20.0000 [IU] | PEN_INJECTOR | Freq: Every day | SUBCUTANEOUS | 1 refills | Status: DC

## 2019-06-20 MED ORDER — HUMALOG KWIKPEN 200 UNIT/ML ~~LOC~~ SOPN: PEN_INJECTOR | SUBCUTANEOUS | 5 refills | Status: DC

## 2019-06-20 MED ORDER — ACETAMINOPHEN 325 MG PO TABS: 650.0000 mg | ORAL_TABLET | Freq: Four times a day (QID) | ORAL | Status: AC | PRN

## 2019-06-20 MED ORDER — METOPROLOL TARTRATE 25 MG PO TABS: 25.0000 mg | ORAL_TABLET | Freq: Two times a day (BID) | ORAL | 1 refills | Status: DC

## 2019-06-20 NOTE — Progress Notes (Signed)
Pt ambulated x 470 feet in hall independently pt tolerated well

## 2019-06-20 NOTE — Progress Notes (Signed)
Order received to discharge patient.  Telemetry monitor removed and CCMD notified.  PIV access removed.  Discharge instructions, follow up, medications and instructions for their use discussed with patient. 

## 2019-06-20 NOTE — Plan of Care (Signed)
Continue to monitor

## 2019-06-20 NOTE — Progress Notes (Signed)
      [address]       [place] [address]             [phone]      6 Days Post-Op Procedure(s) (LRB): CORONARY ARTERY BYPASS GRAFTING (CABG) using LIMA to LAD; Endoscopic Right Greater Saphenous Vein Harvest: SVG to Circ (distal); SVG to RCA (distal). (N/A) TRANSESOPHAGEAL ECHOCARDIOGRAM (TEE) (N/A) Endovein Harvest Of Greater Saphenous Vein (Right) Subjective: Feels good this morning and looking forward to going home.   Objective: Vital signs in last 24 hours: Temp:  [98 F (36.7 C)-98.9 F (37.2 C)] 98.1 F (36.7 C) (05/30 0555) Pulse Rate:  [85-106] 90 (05/30 0555) Cardiac Rhythm: Normal sinus rhythm (05/30 0555) Resp:  [15-20] 15 (05/30 0555) BP: (119-160)/(61-89) 125/61 (05/30 0555) SpO2:  [94 %-98 %] 94 % (05/30 0555) Weight:  [67.6 kg] 67.6 kg (05/30 0300)     Intake/Output from previous day: 05/29 0701 - 05/30 0700 In: 240 [P.O.:240] Out: -  Intake/Output this shift: No intake/output data recorded.  General appearance: alert, cooperative and no distress Heart: sinus tachycardia Lungs: clear to auscultation bilaterally Abdomen: soft, non-tender; bowel sounds normal; no masses,  no organomegaly Extremities: extremities normal, atraumatic, no cyanosis or edema Wound: clean and dry  Lab Results: No results for input(s): WBC, HGB, HCT, PLT in the last 72 hours. BMET: No results for input(s): NA, K, CL, CO2, GLUCOSE, BUN, CREATININE, CALCIUM in the last 72 hours.  PT/INR: No results for input(s): LABPROT, INR in the last 72 hours. ABG    Component Value Date/Time   PHART 7.321 (L) 06/15/2019 0052   HCO3 27.4 06/15/2019 0052   TCO2 29 06/15/2019 0052   O2SAT 92.0 06/15/2019 0052   CBG (last 3)  Recent Labs    06/19/19 1622 06/19/19 2116 06/20/19 0603  GLUCAP 149* 224* 159*    Assessment/Plan: S/P Procedure(s) (LRB): CORONARY ARTERY BYPASS GRAFTING (CABG) using LIMA to LAD; Endoscopic Right Greater Saphenous Vein Harvest: SVG  to Circ (distal); SVG to RCA (distal). (N/A) TRANSESOPHAGEAL ECHOCARDIOGRAM (TEE) (N/A) Endovein Harvest Of Greater Saphenous Vein (Right)  1. CV- BP well controlled, NSR in the 90s-ST in the 100s. BB increased yesterday 2. Pulm-tolerating room air with good oxygen saturation, CXR is stable without any large pleural effusions or pneumo 3. Renal-creatinine 0.44, electrolytes okay. Continue inhalers 4. H and H 10.0/3.34, expected acute blood loss anemia 6. Endo-Will restart Invokana, Keep SSI. 7. + bowel movement, appetite has improved  Plan: Will restart basal insulin and meal coverage at about half of home dose. She was on 64 units of long acting and 10-15 units of short acting. Patient agrees she isn't eating as much as she normally eats. She is ready for discharge today.    LOS: 9 days    Elgie Collard 06/20/2019

## 2019-06-22 ENCOUNTER — Ambulatory Visit: Payer: BC Managed Care – PPO | Admitting: Psychology

## 2019-06-22 ENCOUNTER — Telehealth (HOSPITAL_COMMUNITY): Payer: Self-pay

## 2019-06-22 NOTE — Telephone Encounter (Signed)
Faxed referral to [facility] for Cardiac Rehab.

## 2019-06-23 ENCOUNTER — Other Ambulatory Visit: Payer: Self-pay

## 2019-06-23 DIAGNOSIS — M5412 Radiculopathy, cervical region: Secondary | ICD-10-CM

## 2019-06-23 MED ORDER — GABAPENTIN 800 MG PO TABS: ORAL_TABLET | ORAL | 3 refills | Status: DC

## 2019-06-28 ENCOUNTER — Encounter: Payer: Self-pay | Admitting: Osteopathic Medicine

## 2019-06-28 ENCOUNTER — Other Ambulatory Visit: Payer: Self-pay

## 2019-06-28 ENCOUNTER — Ambulatory Visit (INDEPENDENT_AMBULATORY_CARE_PROVIDER_SITE_OTHER): Payer: BC Managed Care – PPO | Admitting: Osteopathic Medicine

## 2019-06-28 VITALS — BP 115/78 | HR 77 | Temp 97.0°F | Wt 149.0 lb

## 2019-06-28 DIAGNOSIS — E1140 Type 2 diabetes mellitus with diabetic neuropathy, unspecified: Secondary | ICD-10-CM

## 2019-06-28 DIAGNOSIS — IMO0002 Reserved for concepts with insufficient information to code with codable children: Secondary | ICD-10-CM

## 2019-06-28 DIAGNOSIS — E1165 Type 2 diabetes mellitus with hyperglycemia: Secondary | ICD-10-CM | POA: Diagnosis not present

## 2019-06-28 LAB — POCT GLYCOSYLATED HEMOGLOBIN (HGB A1C): Hemoglobin A1C: 7.6 % — AB (ref 4.0–5.6)

## 2019-06-28 MED ORDER — MIRTAZAPINE 45 MG PO TABS: 45.0000 mg | ORAL_TABLET | Freq: Every day | ORAL | 3 refills | Status: DC

## 2019-06-28 MED ORDER — DICLOFENAC SODIUM 1 % EX GEL
4.0000 g | Freq: Four times a day (QID) | CUTANEOUS | 11 refills | Status: DC
Start: 2019-06-28 — End: 2019-11-30

## 2019-06-28 NOTE — Progress Notes (Signed)
Tonya Chandler is a 59 y.o. female who presents to  [facility] [facility]  today, 06/28/19, seeking care for the following: . DM2 F/U - last visit for this 04/22/2019 A1C 9.2, we increased basal and started bolus, she's currently on Basaglar 40 units qhs and Humalog 4 units ac - recent CABG and recovering well, A1C today 7.6.       ASSESSMENT & PLAN with other pertinent history/findings:  The encounter diagnosis was Type 2 diabetes, uncontrolled, with neuropathy ([place]).  Continue current meds. Avoiding hypoglycemia crucial in postop period, A1C better today but not too low!    Results for orders placed or performed in visit on 06/28/19 (from the past 24 hour(s))  POCT HgB A1C     Status: Abnormal   Collection Time: 06/28/19 10:43 AM  Result Value Ref Range   Hemoglobin A1C 7.6 (A) 4.0 - 5.6 %   HbA1c POC (<> result, manual entry)     HbA1c, POC (prediabetic range)     HbA1c, POC (controlled diabetic range)      There are no Patient Instructions on file for this visit.   Orders Placed This Encounter  Procedures  . POCT HgB A1C    Meds ordered this encounter  Medications  . diclofenac Sodium (VOLTAREN) 1 % GEL    Sig: Apply 4 g topically 4 (four) times daily. To affected joint.    Dispense:  100 g    Refill:  11  . mirtazapine (REMERON) 45 MG tablet    Sig: Take 1 tablet (45 mg total) by mouth at bedtime.    Dispense:  90 tablet    Refill:  3       Follow-up instructions: Return in about 3 months (around 09/28/2019) for MONITOR A1C .                                         BP 115/78 (BP Location: Right Arm, Patient Position: Sitting)   Pulse 77   Temp (!) 97 F (36.1 C)   Wt 149 lb (67.6 kg)   SpO2 97%   BMI 29.10 kg/m   Current Meds  Medication Sig  . acetaminophen (TYLENOL) 325 MG tablet Take 2 tablets (650 mg total) by mouth every 6 (six) hours as needed for mild pain.  Marland Kitchen  aspirin EC 325 MG EC tablet Take 1 tablet (325 mg total) by mouth daily.  Marland Kitchen atorvastatin (LIPITOR) 40 MG tablet Take 1 tablet (40 mg total) by mouth daily.  . Cyanocobalamin (VITAMIN B-12) 2500 MCG SUBL Place 2,500 mcg under the tongue daily.  . cyclobenzaprine (FLEXERIL) 10 MG tablet Take 1 tablet (10 mg total) by mouth at bedtime.  . Dulaglutide (TRULICITY) 1.5 SN/0.5LZ SOPN Inject 1.5 mg into the skin every Sunday.  . DULoxetine (CYMBALTA) 60 MG capsule Take 1 capsule (60 mg total) by mouth daily.  Marland Kitchen esomeprazole (NEXIUM) 20 MG capsule Take 40 mg by mouth daily at 12 noon.   Marland Kitchen estradiol (ESTRACE) 1 MG tablet Take 1 mg by mouth at bedtime.   . Famotidine 20 MG CHEW Chew 20 mg by mouth daily as needed (reflux).   . gabapentin (NEURONTIN) 800 MG tablet 1 tab in the morning, 1 tab midday, 2 tabs at bedtime  . Insulin Glargine (BASAGLAR KWIKPEN) 100 UNIT/ML Inject 0.2 mLs (20 Units total) into the skin daily.  Marland Kitchen  insulin lispro (HUMALOG KWIKPEN) 200 UNIT/ML KwikPen Inject 5 units as directed prior to lunch and dinner  . INVOKANA 300 MG TABS tablet Take 1 tablet (300 mg total) by mouth daily.  Marland Kitchen loteprednol (LOTEMAX) 0.5 % ophthalmic suspension Place 1 drop into the right eye 4 (four) times daily.  . magnesium oxide (MAG-OX) 400 MG tablet Take 800 mg by mouth at bedtime.   . metoprolol tartrate (LOPRESSOR) 25 MG tablet Take 1 tablet (25 mg total) by mouth 2 (two) times daily.  . mirtazapine (REMERON) 45 MG tablet Take 1 tablet (45 mg total) by mouth at bedtime.  Marland Kitchen oxyCODONE (OXY IR/ROXICODONE) 5 MG immediate release tablet Take 1 tablet (5 mg total) by mouth every 6 (six) hours as needed for severe pain.  . [DISCONTINUED] mirtazapine (REMERON) 45 MG tablet Take 45 mg by mouth at bedtime.     Results for orders placed or performed in visit on 06/28/19 (from the past 72 hour(s))  POCT HgB A1C     Status: Abnormal   Collection Time: 06/28/19 10:43 AM  Result Value Ref Range   Hemoglobin A1C 7.6 (A)  4.0 - 5.6 %   HbA1c POC (<> result, manual entry)     HbA1c, POC (prediabetic range)     HbA1c, POC (controlled diabetic range)      No results found.  Depression screen [facility] 2/9 01/04/2019 09/30/2018  Decreased Interest 1 1  Down, Depressed, Hopeless 1 1  PHQ - 2 Score 2 2  Altered sleeping 1 1  Tired, decreased energy 0 0  Change in appetite 0 0  Feeling bad or failure about yourself  0 0  Trouble concentrating 0 0  Moving slowly or fidgety/restless 0 0  Suicidal thoughts 0 0  PHQ-9 Score 3 3  Difficult doing work/chores Not difficult at all Somewhat difficult    GAD 7 : Generalized Anxiety Score 01/04/2019 09/30/2018  Nervous, Anxious, on Edge 0 1  Control/stop worrying 0 0  Worry too much - different things 0 1  Trouble relaxing 0 0  Restless 0 0  Easily annoyed or irritable 1 1  Afraid - awful might happen 0 0  Total GAD 7 Score 1 3  Anxiety Difficulty Not difficult at all Somewhat difficult      All questions at time of visit were answered - patient instructed to contact office with any additional concerns or updates.  ER/RTC precautions were reviewed with the patient.  Please note: voice recognition software was used to produce this document, and typos may escape review. Please contact Dr. Sheppard Coil for any needed clarifications.

## 2019-06-29 ENCOUNTER — Telehealth: Payer: Self-pay | Admitting: Osteopathic Medicine

## 2019-06-29 NOTE — Telephone Encounter (Signed)
Received fax for PA on Diclofenac sent through cover my meds and received authorization.   Case ID: [id number] Valid: 05/30/2019 - 6/8/20222. - CF

## 2019-06-30 ENCOUNTER — Telehealth: Payer: Self-pay

## 2019-06-30 NOTE — Telephone Encounter (Signed)
FMLA/STD forms faxed to patients employer @ [phone] DG. Leave date effective 06/11/19- RTW date 09/06/19 Forms mailed to home address 07/01/19

## 2019-07-04 ENCOUNTER — Emergency Department (HOSPITAL_COMMUNITY)
Admission: EM | Admit: 2019-07-04 | Discharge: 2019-07-04 | Disposition: A | Discharge status: Home / Self Care | Payer: BC Managed Care – PPO | Attending: Emergency Medicine | Admitting: Emergency Medicine

## 2019-07-04 ENCOUNTER — Encounter (HOSPITAL_COMMUNITY): Payer: Self-pay | Admitting: Emergency Medicine

## 2019-07-04 ENCOUNTER — Emergency Department (INDEPENDENT_AMBULATORY_CARE_PROVIDER_SITE_OTHER)
Admission: EM | Admit: 2019-07-04 | Discharge: 2019-07-04 | Disposition: A | Discharge status: Other Acute Inpatient Hospital | Payer: BC Managed Care – PPO | Source: Home / Self Care

## 2019-07-04 ENCOUNTER — Other Ambulatory Visit: Payer: Self-pay

## 2019-07-04 ENCOUNTER — Encounter: Payer: Self-pay | Admitting: Emergency Medicine

## 2019-07-04 DIAGNOSIS — R112 Nausea with vomiting, unspecified: Secondary | ICD-10-CM

## 2019-07-04 DIAGNOSIS — E119 Type 2 diabetes mellitus without complications: Secondary | ICD-10-CM | POA: Insufficient documentation

## 2019-07-04 DIAGNOSIS — R030 Elevated blood-pressure reading, without diagnosis of hypertension: Secondary | ICD-10-CM

## 2019-07-04 DIAGNOSIS — I119 Hypertensive heart disease without heart failure: Secondary | ICD-10-CM | POA: Insufficient documentation

## 2019-07-04 DIAGNOSIS — Z7901 Long term (current) use of anticoagulants: Secondary | ICD-10-CM | POA: Insufficient documentation

## 2019-07-04 DIAGNOSIS — I2510 Atherosclerotic heart disease of native coronary artery without angina pectoris: Secondary | ICD-10-CM | POA: Insufficient documentation

## 2019-07-04 DIAGNOSIS — Z79899 Other long term (current) drug therapy: Secondary | ICD-10-CM | POA: Insufficient documentation

## 2019-07-04 DIAGNOSIS — R040 Epistaxis: Secondary | ICD-10-CM

## 2019-07-04 DIAGNOSIS — Z951 Presence of aortocoronary bypass graft: Secondary | ICD-10-CM | POA: Insufficient documentation

## 2019-07-04 DIAGNOSIS — Z794 Long term (current) use of insulin: Secondary | ICD-10-CM | POA: Insufficient documentation

## 2019-07-04 DIAGNOSIS — I10 Essential (primary) hypertension: Secondary | ICD-10-CM | POA: Diagnosis not present

## 2019-07-04 DIAGNOSIS — R58 Hemorrhage, not elsewhere classified: Secondary | ICD-10-CM | POA: Diagnosis not present

## 2019-07-04 DIAGNOSIS — R0902 Hypoxemia: Secondary | ICD-10-CM | POA: Diagnosis not present

## 2019-07-04 MED ORDER — ONDANSETRON 4 MG PO TBDP
4.0000 mg | ORAL_TABLET | Freq: Once | ORAL | Status: AC
  Administered 2019-07-04: 4 mg via ORAL

## 2019-07-04 MED ORDER — PROMETHAZINE HCL 25 MG/ML IJ SOLN
25.0000 mg | Freq: Once | INTRAMUSCULAR | Status: AC
  Administered 2019-07-04: 25 mg via INTRAMUSCULAR

## 2019-07-04 MED ORDER — LIDOCAINE HCL 2 % IJ SOLN
10.0000 mL | Freq: Once | INTRAMUSCULAR | Status: AC
  Administered 2019-07-04: 200 mg
  Filled 2019-07-04: qty 20

## 2019-07-04 MED ORDER — OXYMETAZOLINE HCL 0.05 % NA SOLN
1.0000 | Freq: Once | NASAL | Status: DC
  Filled 2019-07-04: qty 30

## 2019-07-04 NOTE — ED Provider Notes (Signed)
[name]    CSN: [id number] Arrival date & time: 07/04/19  1034      History   Chief Complaint Chief Complaint  Patient presents with  . Epistaxis    HPI Tonya Chandler is a 59 y.o. female.   HPI Tonya Chandler is a 59 y.o. female presenting to UC with c/o bloody nose that started around 9:30AM this morning while she was taking a shower. She reports being angry with her daughter and wonders if that could have contributed to elevated blood pressure at home and her nose bleed. She was discharged from [facility] on 06/20/19 following a CABAG on 06/14/19.  Pt states that her surgical incisions have been healing well. Denies chest pain or SOB. She did have 1 episode of epistaxis while in the hospital but states the nose bleed was easy to control at that time. She is taking aspirin, no other blood thinners. Denies HA or dizziness. She does have some nausea from swallowing blood from the nose bleed.    Past Medical History:  Diagnosis Date  . Abnormal ECG 04/01/2018  . Abnormal stress echocardiogram 05/24/2019   Formatting of this note might be different from the original. Added automatically from request for surgery [id number]  . Anemia   . Anxiety   . Atherosclerosis of native coronary artery with angina pectoris ([place]) 05/10/2015   Formatting of this note might be different from the original. 30% lesion-Chiu  . Bilateral carpal tunnel syndrome 05/23/2017  . Bilateral knee pain 02/16/2010   Qualifier: Diagnosis of  By: Koleen Nimrod MD, Dellis Filbert    . Carpal tunnel syndrome, right 12/16/2017  . Colon polyps   . Depression   . DEPRESSION 02/16/2010   Qualifier: Diagnosis of  By: Koleen Nimrod MD, Dellis Filbert    . Depression, major, in partial remission ([place]) 07/14/2015  . Diabetes mellitus   . Diverticulosis   . Dupuytren's contracture of right hand 06/24/2017  . Essential hypertension 02/16/2010   Qualifier: Diagnosis of  By: Koleen Nimrod MD, Dellis Filbert    . Gastro-esophageal reflux   .  Gastrointestinal ulcer   . Generalized anxiety disorder 03/14/2015  . GERD 02/16/2010   Qualifier: Diagnosis of  By: Koleen Nimrod MD, Dellis Filbert    . H/O: GI bleed    Michela Pitcher it was due to taking metformin  . Hyperlipidemia   . Hyperlipidemia LDL goal <70 02/16/2010   Qualifier: Diagnosis of  By: Koleen Nimrod MD, Dellis Filbert    . Hypertension   . IBS (irritable bowel syndrome)   . Impingement syndrome of shoulder, right 06/24/2017  . Midline low back pain with left-sided sciatica 12/29/2013  . Musculoskeletal chest pain 05/04/2015  . Obesity   . Precordial pain 05/04/2015  . Primary insomnia 03/14/2015  . Radiculitis of right cervical region 01/20/2018  . Radiculopathy 04/02/2018  . Right hand pain 06/24/2017  . RLS (restless legs syndrome) 07/14/2015  . Type 2 diabetes mellitus with diabetic neuropathy, with long-term current use of insulin ([place]) 02/16/2010   Qualifier: Diagnosis of  By: Koleen Nimrod MD, Dellis Filbert      Patient Active Problem List   Diagnosis Date Noted  . S/P CABG x 3   . Unstable angina ([place]) 06/11/2019  . Angina pectoris ([place]) 06/04/2019  . Abnormal stress test 05/24/2019  . Radiculopathy 04/02/2018  . Abnormal ECG 04/01/2018  . Radiculitis of right cervical region 01/20/2018  . Carpal tunnel syndrome, right 12/16/2017  . Right hand pain 06/24/2017  . Impingement syndrome of shoulder, right 06/24/2017  . Dupuytren's  contracture of right hand 06/24/2017  . Bilateral carpal tunnel syndrome 05/23/2017  . Depression, major, in partial remission ([place]) 07/14/2015  . RLS (restless legs syndrome) 07/14/2015  . Atherosclerosis of native coronary artery with angina pectoris ([place]) 05/10/2015  . Musculoskeletal chest pain 05/04/2015  . Precordial pain 05/04/2015  . Generalized anxiety disorder 03/14/2015  . Primary insomnia 03/14/2015  . Midline low back pain with left-sided sciatica 12/29/2013  . Type 2 diabetes mellitus with diabetic neuropathy, with long-term current use of insulin ([place])  02/16/2010  . Hyperlipidemia LDL goal <70 02/16/2010  . DEPRESSION 02/16/2010  . Essential hypertension 02/16/2010  . GERD 02/16/2010  . Bilateral knee pain 02/16/2010    Past Surgical History:  Procedure Laterality Date  . ABDOMINAL HYSTERECTOMY    . ANTERIOR CERVICAL DECOMP/DISCECTOMY FUSION N/A 04/02/2018   Procedure: ANTERIOR CERVICAL DECOMPRESSION FUSION, CERVICAL FOUR-FIVE, CERVICAL FIVE-SIX, WITH INSTRUMENTATION AND ALLOGRAFT;  Surgeon: Phylliss Bob, MD;  Location: [place];  Service: Orthopedics;  Laterality: N/A;  . COLONOSCOPY WITH ESOPHAGOGASTRODUODENOSCOPY (EGD)  05/04/2009  . CORONARY ARTERY BYPASS GRAFT N/A 06/14/2019   Procedure: CORONARY ARTERY BYPASS GRAFTING (CABG) using LIMA to LAD; Endoscopic Right Greater Saphenous Vein Harvest: SVG to Circ (distal); SVG to RCA (distal).;  Surgeon: [name], MD;  Location: [place];  Service: Open Heart Surgery;  Laterality: N/A;  . ENDOVEIN HARVEST OF GREATER SAPHENOUS VEIN Right 06/14/2019   Procedure: [place] Of Greater Saphenous Vein;  Surgeon: [name], MD;  Location: [place];  Service: Open Heart Surgery;  Laterality: Right;  . LEFT HEART CATH AND CORONARY ANGIOGRAPHY N/A 06/11/2019   Procedure: LEFT HEART CATH AND CORONARY ANGIOGRAPHY;  Surgeon: Wellington Hampshire, MD;  Location: [place] [facility];  Service: Cardiovascular;  Laterality: N/A;  . PIP JOINT FUSION Bilateral    pinky  . TEE WITHOUT CARDIOVERSION N/A 06/14/2019   Procedure: TRANSESOPHAGEAL ECHOCARDIOGRAM (TEE);  Surgeon: [name], MD;  Location: [place];  Service: Open Heart Surgery;  Laterality: N/A;    OB History   No obstetric history on file.      Home Medications    Prior to Admission medications   Medication Sig Start Date End Date Taking? Authorizing Provider  acetaminophen (TYLENOL) 325 MG tablet Take 2 tablets (650 mg total) by mouth every 6 (six) hours as needed for mild pain. 06/20/19   Elgie Collard, PA-C  aspirin EC  325 MG EC tablet Take 1 tablet (325 mg total) by mouth daily. 06/20/19   Elgie Collard, PA-C  atorvastatin (LIPITOR) 40 MG tablet Take 1 tablet (40 mg total) by mouth daily. 10/02/18   Emeterio Reeve, DO  Cyanocobalamin (VITAMIN B-12) 2500 MCG SUBL Place 2,500 mcg under the tongue daily.    [provider]  cyclobenzaprine (FLEXERIL) 10 MG tablet Take 1 tablet (10 mg total) by mouth at bedtime. 05/18/19   Silverio Decamp, MD  diclofenac Sodium (VOLTAREN) 1 % GEL Apply 4 g topically 4 (four) times daily. To affected joint. 06/28/19   Emeterio Reeve, DO  Dulaglutide (TRULICITY) 1.5 DS/2.8JG SOPN Inject 1.5 mg into the skin every Sunday. 11/15/18   Emeterio Reeve, DO  DULoxetine (CYMBALTA) 60 MG capsule Take 1 capsule (60 mg total) by mouth daily. 01/06/19   Emeterio Reeve, DO  esomeprazole (NEXIUM) 20 MG capsule Take 40 mg by mouth daily at 12 noon.     [provider]  estradiol (ESTRACE) 1 MG tablet Take 1 mg by mouth at bedtime.  12/12/17   [provider]  Famotidine 20 MG CHEW Chew 20 mg by mouth daily as needed (reflux).     [provider]  gabapentin (NEURONTIN) 800 MG tablet 1 tab in the morning, 1 tab midday, 2 tabs at bedtime 06/23/19   Emeterio Reeve, DO  Insulin Glargine [facility]) 100 UNIT/ML Inject 0.2 mLs (20 Units total) into the skin daily. 06/20/19   Elgie Collard, PA-C  insulin lispro (HUMALOG KWIKPEN) 200 UNIT/ML KwikPen Inject 5 units as directed prior to lunch and dinner 06/20/19   Harriet Pho, Tessa N, PA-C  INVOKANA 300 MG TABS tablet Take 1 tablet (300 mg total) by mouth daily. 05/20/19   Emeterio Reeve, DO  loteprednol (LOTEMAX) 0.5 % ophthalmic suspension Place 1 drop into the right eye 4 (four) times daily.    [provider]  magnesium oxide (MAG-OX) 400 MG tablet Take 800 mg by mouth at bedtime.     [provider]  metoprolol tartrate (LOPRESSOR) 25 MG tablet Take 1 tablet (25 mg total) by  mouth 2 (two) times daily. 06/20/19   Elgie Collard, PA-C  mirtazapine (REMERON) 45 MG tablet Take 1 tablet (45 mg total) by mouth at bedtime. 06/28/19   Emeterio Reeve, DO  rOPINIRole (REQUIP) 0.5 MG tablet Take 3 tablets (1.5 mg total) by mouth at bedtime. 12/31/18 06/08/19  Emeterio Reeve, DO    Family History Family History  Problem Relation Age of Onset  . Diabetes Mother   . Dementia Mother   . High blood pressure Mother   . Heart attack Mother   . Colon cancer Mother   . Ovarian cancer Mother   . Heart attack Father   . Diabetes Father   . Heart attack Brother   . High blood pressure Brother   . Diabetes Brother   . Epilepsy Brother   . Colon polyps Daughter 81  . Esophageal cancer Neg Hx   . Rectal cancer Neg Hx   . Stomach cancer Neg Hx   . Heart disease Neg Hx     Social History Social History   Tobacco Use  . Smoking status: Never Smoker  . Smokeless tobacco: Never Used  Vaping Use  . Vaping Use: Never used  Substance Use Topics  . Alcohol use: No  . Drug use: No     Allergies   Adhesive [tape], Ampicillin, Cefprozil, and Meloxicam   Review of Systems Review of Systems  Constitutional: Negative for chills and fever.  HENT: Positive for nosebleeds. Negative for congestion.   Respiratory: Negative for chest tightness and shortness of breath.   Cardiovascular: Negative for chest pain and palpitations.  Gastrointestinal: Positive for nausea and vomiting (in triage). Negative for abdominal pain.  Neurological: Negative for dizziness, light-headedness and headaches.     Physical Exam Triage Vital Signs ED Triage Vitals  Enc Vitals Group     BP      Pulse      Resp      Temp      Temp src      SpO2      Weight      Height      Head Circumference      Peak Flow      Pain Score      Pain Loc      Pain Edu?      Excl. in [place]?    No data found.  Updated Vital Signs BP (!) 160/106 (BP Location: Right Arm)  Pulse 85   Resp 18   SpO2  98%   Visual Acuity Right Eye Distance:   Left Eye Distance:   Bilateral Distance:    Right Eye Near:   Left Eye Near:    Bilateral Near:     Physical Exam Vitals and nursing note reviewed.  Constitutional:      Appearance: Normal appearance. She is well-developed.  HENT:     Head: Normocephalic and atraumatic.     Nose:     Right Nostril: Epistaxis and occlusion (blood clot) present. No septal hematoma.     Left Nostril: Epistaxis present. No septal hematoma.     Right Sinus: No maxillary sinus tenderness or frontal sinus tenderness.     Left Sinus: No maxillary sinus tenderness or frontal sinus tenderness.     Mouth/Throat:     Lips: Pink.     Mouth: Mucous membranes are moist.     Pharynx: Oropharynx is clear. Uvula midline.     Comments: Blood clots in posterior pharynx Cardiovascular:     Rate and Rhythm: Normal rate and regular rhythm.  Pulmonary:     Effort: Pulmonary effort is normal. No respiratory distress.     Breath sounds: Normal breath sounds.  Musculoskeletal:        General: Normal range of motion.     Cervical back: Normal range of motion.  Skin:    General: Skin is warm and dry.  Neurological:     Mental Status: She is alert and oriented to person, place, and time.  Psychiatric:        Behavior: Behavior normal.      UC Treatments / Results  Labs (all labs ordered are listed, but only abnormal results are displayed) Labs Reviewed - No data to display  EKG   Radiology No results found.  Procedures Procedures (including critical care time)  Medications Ordered in UC Medications  ondansetron (ZOFRAN-ODT) disintegrating tablet 4 mg (4 mg Oral Given 07/04/19 1045)  promethazine (PHENERGAN) injection 25 mg (25 mg Intramuscular Given 07/04/19 1057)    Initial Impression / Assessment and Plan / UC Course  I have reviewed the triage vital signs and the nursing notes.  Pertinent labs & imaging results that were available during my care of the  patient were reviewed by me and considered in my medical decision making (see chart for details).    BP elevated in UC. Pt states it had been running low recently (100s/60s) today it is 160/106. Attempted to stop nose bleed using direct pressure, cold pack, and Afrin w/o relief. zofran given, pt vomited up blood clots within a few seconds of placing the ODT Zofran under her tongue. Nose became to bleed again.  Pt agreeable to have EMS transport her to the hospital. Pt given phenergan 25mg  IM prior to discharge.  EMS arrived, assumed care of stable pt. Pt discharged in stable condition.   Final Clinical Impressions(s) / UC Diagnoses   Final diagnoses:  Epistaxis  Nausea and vomiting in adult  Elevated blood pressure reading   Discharge Instructions   None    ED Prescriptions    None     PDMP not reviewed this encounter.   Noe Gens, PA-C 07/04/19 1139

## 2019-07-04 NOTE — ED Triage Notes (Signed)
Pt arrives with [facility] from [facility] c/o nosebleed in right nare that started at 0930 this morning. Pt was given Afrin at [facility] and in en route to ED with EMS. Pt is on 81 mg ASA and recently had triple bypass CABG at [facility] on 06/14/19. Nose packed and ice pack placed by EMS. Pressure applied to nose currently.   EMS vitals:  158/113 90 HR  93-95% O2 on RA

## 2019-07-04 NOTE — ED Notes (Signed)
Patient verbalizes understanding of discharge instructions. Opportunity for questioning and answers were provided. Armband removed by staff, pt discharged from ED.  

## 2019-07-04 NOTE — ED Notes (Signed)
  Pt arrives to UC after nose bleed started at 0930 while in the shower. Pt states she has increased stress at home w/ her daughter. Pt states bleeding has not stopped - has gone through 3 large hand towels and is swallowing blood. Recent CABG at [facility] in May - BP meds recently changed per pt. Pt has active nose bleed to bilat nares

## 2019-07-04 NOTE — ED Notes (Signed)
Pt transferred to [facility] via [facility] for higher level of care for Epistaxis to bilateral nares. Bleeding not able to be stopped or controlled in procedure room. See provider notes. Pt requesting to go to [facility] due to recent CABG. Pt verbalized consent, pt's husband at bedside, will follow in car. PIV not started prior to transfer per EMS request. Phenergan given as charted after pt vomited SL zofran.  VS as documented.

## 2019-07-04 NOTE — ED Provider Notes (Signed)
[facility] EMERGENCY DEPARTMENT Provider Note   CSN: [id number] Arrival date & time: 07/04/19  1157     History Chief Complaint  Patient presents with  . Epistaxis    Tonya Chandler is a 59 y.o. female.  HPI   58 year-old female presents today complaining of nosebleed that began this morning.  She has had bleeding from her right nares.  She was recently in the hospital and had cardiac bypass surgery.  She is currently taking only aspirin for blood thinner.  She reports that her only previous nosebleed was while she was in the hospital and they thought that it was secondary to oxygen.  She denies pain, lightheadedness, chest pain, or dyspnea.  She has had no known trauma to the area.  She has not had any recent URI symptoms.  Past Medical History:  Diagnosis Date  . Abnormal ECG 04/01/2018  . Abnormal stress echocardiogram 05/24/2019   Formatting of this note might be different from the original. Added automatically from request for surgery [id number]  . Anemia   . Anxiety   . Atherosclerosis of native coronary artery with angina pectoris ([place]) 05/10/2015   Formatting of this note might be different from the original. 30% lesion-Chiu  . Bilateral carpal tunnel syndrome 05/23/2017  . Bilateral knee pain 02/16/2010   Qualifier: Diagnosis of  By: Koleen Nimrod MD, Dellis Filbert    . Carpal tunnel syndrome, right 12/16/2017  . Colon polyps   . Depression   . DEPRESSION 02/16/2010   Qualifier: Diagnosis of  By: Koleen Nimrod MD, Dellis Filbert    . Depression, major, in partial remission ([place]) 07/14/2015  . Diabetes mellitus   . Diverticulosis   . Dupuytren's contracture of right hand 06/24/2017  . Essential hypertension 02/16/2010   Qualifier: Diagnosis of  By: Koleen Nimrod MD, Dellis Filbert    . Gastro-esophageal reflux   . Gastrointestinal ulcer   . Generalized anxiety disorder 03/14/2015  . GERD 02/16/2010   Qualifier: Diagnosis of  By: Koleen Nimrod MD, Dellis Filbert    . H/O: GI bleed    Michela Pitcher it was due to  taking metformin  . Hyperlipidemia   . Hyperlipidemia LDL goal <70 02/16/2010   Qualifier: Diagnosis of  By: Koleen Nimrod MD, Dellis Filbert    . Hypertension   . IBS (irritable bowel syndrome)   . Impingement syndrome of shoulder, right 06/24/2017  . Midline low back pain with left-sided sciatica 12/29/2013  . Musculoskeletal chest pain 05/04/2015  . Obesity   . Precordial pain 05/04/2015  . Primary insomnia 03/14/2015  . Radiculitis of right cervical region 01/20/2018  . Radiculopathy 04/02/2018  . Right hand pain 06/24/2017  . RLS (restless legs syndrome) 07/14/2015  . Type 2 diabetes mellitus with diabetic neuropathy, with long-term current use of insulin ([place]) 02/16/2010   Qualifier: Diagnosis of  By: Koleen Nimrod MD, Dellis Filbert      Patient Active Problem List   Diagnosis Date Noted  . S/P CABG x 3   . Unstable angina ([place]) 06/11/2019  . Angina pectoris ([place]) 06/04/2019  . Abnormal stress test 05/24/2019  . Radiculopathy 04/02/2018  . Abnormal ECG 04/01/2018  . Radiculitis of right cervical region 01/20/2018  . Carpal tunnel syndrome, right 12/16/2017  . Right hand pain 06/24/2017  . Impingement syndrome of shoulder, right 06/24/2017  . Dupuytren's contracture of right hand 06/24/2017  . Bilateral carpal tunnel syndrome 05/23/2017  . Depression, major, in partial remission ([place]) 07/14/2015  . RLS (restless legs syndrome) 07/14/2015  . Atherosclerosis of native coronary artery  with angina pectoris ([place]) 05/10/2015  . Musculoskeletal chest pain 05/04/2015  . Precordial pain 05/04/2015  . Generalized anxiety disorder 03/14/2015  . Primary insomnia 03/14/2015  . Midline low back pain with left-sided sciatica 12/29/2013  . Type 2 diabetes mellitus with diabetic neuropathy, with long-term current use of insulin ([place]) 02/16/2010  . Hyperlipidemia LDL goal <70 02/16/2010  . DEPRESSION 02/16/2010  . Essential hypertension 02/16/2010  . GERD 02/16/2010  . Bilateral knee pain 02/16/2010    Past  Surgical History:  Procedure Laterality Date  . ABDOMINAL HYSTERECTOMY    . ANTERIOR CERVICAL DECOMP/DISCECTOMY FUSION N/A 04/02/2018   Procedure: ANTERIOR CERVICAL DECOMPRESSION FUSION, CERVICAL FOUR-FIVE, CERVICAL FIVE-SIX, WITH INSTRUMENTATION AND ALLOGRAFT;  Surgeon: Phylliss Bob, MD;  Location: [facility];  Service: Orthopedics;  Laterality: N/A;  . COLONOSCOPY WITH ESOPHAGOGASTRODUODENOSCOPY (EGD)  05/04/2009  . CORONARY ARTERY BYPASS GRAFT N/A 06/14/2019   Procedure: CORONARY ARTERY BYPASS GRAFTING (CABG) using LIMA to LAD; Endoscopic Right Greater Saphenous Vein Harvest: SVG to Circ (distal); SVG to RCA (distal).;  Surgeon: [name], MD;  Location: [facility];  Service: Open Heart Surgery;  Laterality: N/A;  . ENDOVEIN HARVEST OF GREATER SAPHENOUS VEIN Right 06/14/2019   Procedure: [place] Of Greater Saphenous Vein;  Surgeon: [name], MD;  Location: [facility];  Service: Open Heart Surgery;  Laterality: Right;  . LEFT HEART CATH AND CORONARY ANGIOGRAPHY N/A 06/11/2019   Procedure: LEFT HEART CATH AND CORONARY ANGIOGRAPHY;  Surgeon: Wellington Hampshire, MD;  Location: [facility];  Service: Cardiovascular;  Laterality: N/A;  . PIP JOINT FUSION Bilateral    pinky  . TEE WITHOUT CARDIOVERSION N/A 06/14/2019   Procedure: TRANSESOPHAGEAL ECHOCARDIOGRAM (TEE);  Surgeon: [name], MD;  Location: [facility];  Service: Open Heart Surgery;  Laterality: N/A;     OB History   No obstetric history on file.     Family History  Problem Relation Age of Onset  . Diabetes Mother   . Dementia Mother   . High blood pressure Mother   . Heart attack Mother   . Colon cancer Mother   . Ovarian cancer Mother   . Heart attack Father   . Diabetes Father   . Heart attack Brother   . High blood pressure Brother   . Diabetes Brother   . Epilepsy Brother   . Colon polyps Daughter 60  . Esophageal cancer Neg Hx   . Rectal cancer Neg Hx   . Stomach cancer Neg Hx   . Heart  disease Neg Hx     Social History   Tobacco Use  . Smoking status: Never Smoker  . Smokeless tobacco: Never Used  Vaping Use  . Vaping Use: Never used  Substance Use Topics  . Alcohol use: No  . Drug use: No    Home Medications Prior to Admission medications   Medication Sig Start Date End Date Taking? Authorizing Provider  acetaminophen (TYLENOL) 325 MG tablet Take 2 tablets (650 mg total) by mouth every 6 (six) hours as needed for mild pain. 06/20/19   Elgie Collard, PA-C  aspirin EC 325 MG EC tablet Take 1 tablet (325 mg total) by mouth daily. 06/20/19   Elgie Collard, PA-C  atorvastatin (LIPITOR) 40 MG tablet Take 1 tablet (40 mg total) by mouth daily. 10/02/18   Emeterio Reeve, DO  Cyanocobalamin (VITAMIN B-12) 2500 MCG SUBL Place 2,500 mcg under the tongue daily.    [provider]  cyclobenzaprine (FLEXERIL)  10 MG tablet Take 1 tablet (10 mg total) by mouth at bedtime. 05/18/19   Silverio Decamp, MD  diclofenac Sodium (VOLTAREN) 1 % GEL Apply 4 g topically 4 (four) times daily. To affected joint. 06/28/19   Emeterio Reeve, DO  Dulaglutide (TRULICITY) 1.5 TX/6.4WO SOPN Inject 1.5 mg into the skin every Sunday. 11/15/18   Emeterio Reeve, DO  DULoxetine (CYMBALTA) 60 MG capsule Take 1 capsule (60 mg total) by mouth daily. 01/06/19   Emeterio Reeve, DO  esomeprazole (NEXIUM) 20 MG capsule Take 40 mg by mouth daily at 12 noon.     [provider]  estradiol (ESTRACE) 1 MG tablet Take 1 mg by mouth at bedtime.  12/12/17   [provider]  Famotidine 20 MG CHEW Chew 20 mg by mouth daily as needed (reflux).     [provider]  gabapentin (NEURONTIN) 800 MG tablet 1 tab in the morning, 1 tab midday, 2 tabs at bedtime 06/23/19   Emeterio Reeve, DO  Insulin Glargine [facility]) 100 UNIT/ML Inject 0.2 mLs (20 Units total) into the skin daily. 06/20/19   Elgie Collard, PA-C  insulin lispro (HUMALOG KWIKPEN) 200 UNIT/ML KwikPen  Inject 5 units as directed prior to lunch and dinner 06/20/19   Harriet Pho, Tessa N, PA-C  INVOKANA 300 MG TABS tablet Take 1 tablet (300 mg total) by mouth daily. 05/20/19   Emeterio Reeve, DO  loteprednol (LOTEMAX) 0.5 % ophthalmic suspension Place 1 drop into the right eye 4 (four) times daily.    [provider]  magnesium oxide (MAG-OX) 400 MG tablet Take 800 mg by mouth at bedtime.     [provider]  metoprolol tartrate (LOPRESSOR) 25 MG tablet Take 1 tablet (25 mg total) by mouth 2 (two) times daily. 06/20/19   Elgie Collard, PA-C  mirtazapine (REMERON) 45 MG tablet Take 1 tablet (45 mg total) by mouth at bedtime. 06/28/19   Emeterio Reeve, DO  rOPINIRole (REQUIP) 0.5 MG tablet Take 3 tablets (1.5 mg total) by mouth at bedtime. 12/31/18 06/08/19  Emeterio Reeve, DO    Allergies    Adhesive [tape], Ampicillin, Cefprozil, and Meloxicam  Review of Systems   Review of Systems  All other systems reviewed and are negative.   Physical Exam Updated Vital Signs BP (!) 155/91 (BP Location: Right Arm)   Pulse 89   Temp 97.8 F (36.6 C) (Oral)   Resp 17   Ht 1.524 m (5')   Wt 67.6 kg   SpO2 96%   BMI 29.10 kg/m   Physical Exam Vitals and nursing note reviewed.  HENT:     Head: Normocephalic.     Left Ear: External ear normal.     Nose:     Comments: Patient is currently holding pressure on nose    Mouth/Throat:     Mouth: Mucous membranes are moist.     Pharynx: Oropharynx is clear.  Eyes:     Pupils: Pupils are equal, round, and reactive to light.  Cardiovascular:     Rate and Rhythm: Normal rate.  Pulmonary:     Effort: Pulmonary effort is normal.  Abdominal:     General: Abdomen is flat.     Palpations: Abdomen is soft.  Musculoskeletal:        General: Normal range of motion.     Cervical back: Normal range of motion.  Skin:    General: Skin is warm and dry.  Neurological:     General: No  focal deficit present.     Mental Status: She is  alert.  Psychiatric:        Mood and Affect: Mood normal.     ED Results / Procedures / Treatments   Labs (all labs ordered are listed, but only abnormal results are displayed) Labs Reviewed - No data to display  EKG None  Radiology No results found.  Procedures .Epistaxis Management  Date/Time: 07/04/2019 1:19 PM Performed by: Pattricia Boss, MD Authorized by: Pattricia Boss, MD   Consent:    Consent obtained:  Verbal   Consent given by:  Patient   Risks discussed:  Bleeding and infection Anesthesia (see MAR for exact dosages):    Anesthesia method:  Topical application   Topical anesthetic:  Lidocaine gel Procedure details:    Treatment site:  R anterior   Treatment method:  Anterior pack   Treatment complexity:  Extensive   Treatment episode: recurring   Post-procedure details:    Assessment:  Bleeding stopped   Patient tolerance of procedure:  Tolerated well, no immediate complications Comments:     Patient held pressure for at least 30 minutes and continue to have bleeding with clots. She received Afrin twice prehospital once at urgent care and once via EMS.  Here she received topical lidocaine and had 4.5 cm Rhino Rocket placed.   (including critical care time)  Medications Ordered in ED Medications - No data to display  ED Course  I have reviewed the triage vital signs and the nursing notes.  Pertinent labs & imaging results that were available during my care of the patient were reviewed by me and considered in my medical decision making (see chart for details).    MDM Rules/Calculators/A&P                          2:10 PM Patient's epistaxis has resolved.  She has been observed for approximately 40 minutes post packing placement.  Plan referral to ENT.  Patient advised regarding need for follow-up return precautions and voiced understanding. Final Clinical Impression(s) / ED Diagnoses Final diagnoses:  Epistaxis    Rx / DC Orders ED Discharge  Orders    None       Pattricia Boss, MD 07/04/19 1411

## 2019-07-05 ENCOUNTER — Encounter: Payer: Self-pay | Admitting: Cardiology

## 2019-07-05 ENCOUNTER — Ambulatory Visit (INDEPENDENT_AMBULATORY_CARE_PROVIDER_SITE_OTHER): Payer: BC Managed Care – PPO | Admitting: Cardiology

## 2019-07-05 VITALS — BP 148/94 | HR 88 | Ht 60.0 in | Wt 148.0 lb

## 2019-07-05 DIAGNOSIS — E785 Hyperlipidemia, unspecified: Secondary | ICD-10-CM | POA: Diagnosis not present

## 2019-07-05 DIAGNOSIS — Z951 Presence of aortocoronary bypass graft: Secondary | ICD-10-CM | POA: Diagnosis not present

## 2019-07-05 DIAGNOSIS — E1140 Type 2 diabetes mellitus with diabetic neuropathy, unspecified: Secondary | ICD-10-CM

## 2019-07-05 DIAGNOSIS — I10 Essential (primary) hypertension: Secondary | ICD-10-CM

## 2019-07-05 DIAGNOSIS — Z794 Long term (current) use of insulin: Secondary | ICD-10-CM

## 2019-07-05 MED ORDER — LISINOPRIL 2.5 MG PO TABS: 2.5000 mg | ORAL_TABLET | Freq: Every day | ORAL | 1 refills | Status: DC

## 2019-07-05 NOTE — Patient Instructions (Signed)
Medication Instructions:  Your physician has recommended you make the following change in your medication:   START: Lisinopril 2.5 mg daily   *If you need a refill on your cardiac medications before your next appointment, please call your pharmacy*   Lab Work: Your physician recommends that you return for lab work today: bmp   If you have labs (blood work) drawn today and your tests are completely normal, you will receive your results only by: Marland Kitchen MyChart Message (if you have MyChart) OR . A paper copy in the mail If you have any lab test that is abnormal or we need to change your treatment, we will call you to review the results.   Testing/Procedures: None.   Follow-Up: At [facility], you and your health needs are our priority.  As part of our continuing mission to provide you with exceptional heart care, we have created designated Provider Care Teams.  These Care Teams include your primary Cardiologist (physician) and Advanced Practice Providers (APPs -  Physician Assistants and Nurse Practitioners) who all work together to provide you with the care you need, when you need it.  We recommend signing up for the patient portal called "MyChart".  Sign up information is provided on this After Visit Summary.  MyChart is used to connect with patients for Virtual Visits (Telemedicine).  Patients are able to view lab/test results, encounter notes, upcoming appointments, etc.  Non-urgent messages can be sent to your provider as well.   To learn more about what you can do with MyChart, go to [email or web address].    Your next appointment:   1 month(s)  The format for your next appointment:   In Person  Provider:   Jenne Campus, MD   Other Instructions  Lisinopril Tablets What is this medicine? LISINOPRIL (lyse IN oh pril) is an ACE inhibitor. It treats high blood pressure and heart failure. It can treat heart damage after a heart attack. This medicine may be used for other  purposes; ask your health care provider or pharmacist if you have questions. COMMON BRAND NAME(S): Prinivil, Zestril What should I tell my health care provider before I take this medicine? They need to know if you have any of these conditions:  diabetes  heart or blood vessel disease  kidney disease  low blood pressure  previous swelling of the tongue, face, or lips with difficulty breathing, difficulty swallowing, hoarseness, or tightening of the throat  an unusual or allergic reaction to lisinopril, other ACE inhibitors, insect venom, foods, dyes, or preservatives  pregnant or trying to get pregnant  breast-feeding How should I use this medicine? Take this drug by mouth. Take it as directed on the prescription label at the same time every day. You can take it with or without food. If it upsets your stomach, take it with food. Keep taking it unless your health care provider tells you to stop. Talk to your health care provider about the use of this drug in children. While it may be prescribed for children as young as 6 for selected conditions, precautions do apply. Overdosage: If you think you have taken too much of this medicine contact a poison control center or emergency room at once. NOTE: This medicine is only for you. Do not share this medicine with others. What if I miss a dose? If you miss a dose, take it as soon as you can. If it is almost time for your next dose, take only that dose. Do not take double or extra  doses. What may interact with this medicine? Do not take this medicine with any of the following medications:  hymenoptera venom  sacubitril; valsartan This medicines may also interact with the following medications:  aliskiren  angiotensin receptor blockers, like losartan or valsartan  certain medicines for diabetes  diuretics  everolimus  gold compounds  lithium  NSAIDs, medicines for pain and inflammation, like ibuprofen or naproxen  potassium  salts or supplements  salt substitutes  sirolimus  temsirolimus This list may not describe all possible interactions. Give your health care provider a list of all the medicines, herbs, non-prescription drugs, or dietary supplements you use. Also tell them if you smoke, drink alcohol, or use illegal drugs. Some items may interact with your medicine. What should I watch for while using this medicine? Visit your doctor or health care professional for regular check ups. Check your blood pressure as directed. Ask your doctor what your blood pressure should be, and when you should contact him or her. Do not treat yourself for coughs, colds, or pain while you are using this medicine without asking your doctor or health care professional for advice. Some ingredients may increase your blood pressure. Women should inform their doctor if they wish to become pregnant or think they might be pregnant. There is a potential for serious side effects to an unborn child. Talk to your health care professional or pharmacist for more information. Check with your doctor or health care professional if you get an attack of severe diarrhea, nausea and vomiting, or if you sweat a lot. The loss of too much body fluid can make it dangerous for you to take this medicine. You may get drowsy or dizzy. Do not drive, use machinery, or do anything that needs mental alertness until you know how this drug affects you. Do not stand or sit up quickly, especially if you are an older patient. This reduces the risk of dizzy or fainting spells. Alcohol can make you more drowsy and dizzy. Avoid alcoholic drinks. Avoid salt substitutes unless you are told otherwise by your doctor or health care professional. What side effects may I notice from receiving this medicine? Side effects that you should report to your doctor or health care professional as soon as possible:  allergic reactions like skin rash, itching or hives, swelling of the hands,  feet, face, lips, throat, or tongue  breathing problems  signs and symptoms of kidney injury like trouble passing urine or change in the amount of urine  signs and symptoms of increased potassium like muscle weakness; chest pain; or fast, irregular heartbeat  signs and symptoms of liver injury like dark yellow or brown urine; general ill feeling or flu-like symptoms; light-colored stools; loss of appetite; nausea; right upper belly pain; unusually weak or tired; yellowing of the eyes or skin  signs and symptoms of low blood pressure like dizziness; feeling faint or lightheaded, falls; unusually weak or tired  stomach pain with or without nausea and vomiting Side effects that usually do not require medical attention (report to your doctor or health care professional if they continue or are bothersome):  changes in taste  cough  dizziness  fever  headache  sensitivity to light This list may not describe all possible side effects. Call your doctor for medical advice about side effects. You may report side effects to FDA at 1-800-FDA-1088. Where should I keep my medicine? Keep out of the reach of children and pets. Store at room temperature between 20 and 25 degrees  C (68 and 77 degrees F). Protect from moisture. Keep the container tightly closed. Do not freeze. Avoid exposure to extreme heat. Throw away any unused drug after the expiration date. NOTE: This sheet is a summary. It may not cover all possible information. If you have questions about this medicine, talk to your doctor, pharmacist, or health care provider.  2020 Elsevier/Gold Standard (2018-08-12 11:38:35)
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
## 2019-07-06 ENCOUNTER — Ambulatory Visit: Payer: BC Managed Care – PPO | Admitting: Psychology

## 2019-07-06 LAB — BASIC METABOLIC PANEL
BUN/Creatinine Ratio: 19 (ref 12–28)
BUN: 11 mg/dL (ref 8–27)
CO2: 23 mmol/L (ref 20–29)
Calcium: 9.5 mg/dL (ref 8.7–10.3)
Chloride: 99 mmol/L (ref 96–106)
Creatinine, Ser: 0.57 mg/dL (ref 0.57–1.00)
GFR calc Af Amer: 117 mL/min/{1.73_m2} (ref >59)
GFR calc non Af Amer: 101 mL/min/{1.73_m2} (ref >59)
Glucose: 164 mg/dL — ABNORMAL HIGH (ref 65–99)
Potassium: 4.4 mmol/L (ref 3.5–5.2)
Sodium: 140 mmol/L (ref 134–144)

## 2019-07-08 DIAGNOSIS — H748X1 Other specified disorders of right middle ear and mastoid: Secondary | ICD-10-CM | POA: Diagnosis not present

## 2019-07-08 DIAGNOSIS — Z7982 Long term (current) use of aspirin: Secondary | ICD-10-CM | POA: Diagnosis not present

## 2019-07-08 DIAGNOSIS — J3489 Other specified disorders of nose and nasal sinuses: Secondary | ICD-10-CM | POA: Diagnosis not present

## 2019-07-08 DIAGNOSIS — R040 Epistaxis: Secondary | ICD-10-CM | POA: Diagnosis not present

## 2019-07-09 ENCOUNTER — Telehealth: Payer: Self-pay

## 2019-07-09 NOTE — Telephone Encounter (Signed)
27 statement/AFLAC form completed and faxed to [phone] per patient Form mailed to patient's home address

## 2019-07-19 ENCOUNTER — Other Ambulatory Visit: Payer: Self-pay

## 2019-07-19 DIAGNOSIS — E1140 Type 2 diabetes mellitus with diabetic neuropathy, unspecified: Secondary | ICD-10-CM

## 2019-07-19 DIAGNOSIS — IMO0002 Reserved for concepts with insufficient information to code with codable children: Secondary | ICD-10-CM

## 2019-07-19 MED ORDER — BASAGLAR KWIKPEN 100 UNIT/ML ~~LOC~~ SOPN: 20.0000 [IU] | PEN_INJECTOR | Freq: Every day | SUBCUTANEOUS | 1 refills | Status: DC

## 2019-07-21 ENCOUNTER — Other Ambulatory Visit: Payer: Self-pay | Admitting: Cardiothoracic Surgery

## 2019-07-21 DIAGNOSIS — Z951 Presence of aortocoronary bypass graft: Secondary | ICD-10-CM

## 2019-07-22 ENCOUNTER — Ambulatory Visit
Admission: RE | Admit: 2019-07-22 | Discharge: 2019-07-22 | Disposition: A | Discharge status: Home / Self Care | Payer: BC Managed Care – PPO | Source: Ambulatory Visit | Attending: Cardiothoracic Surgery | Admitting: Cardiothoracic Surgery

## 2019-07-22 ENCOUNTER — Ambulatory Visit (INDEPENDENT_AMBULATORY_CARE_PROVIDER_SITE_OTHER): Payer: Self-pay | Admitting: Cardiothoracic Surgery

## 2019-07-22 ENCOUNTER — Other Ambulatory Visit: Payer: Self-pay

## 2019-07-22 VITALS — BP 140/92 | HR 84 | Temp 97.9°F | Resp 16 | Ht 60.0 in | Wt 149.0 lb

## 2019-07-22 DIAGNOSIS — R918 Other nonspecific abnormal finding of lung field: Secondary | ICD-10-CM | POA: Diagnosis not present

## 2019-07-22 DIAGNOSIS — I2510 Atherosclerotic heart disease of native coronary artery without angina pectoris: Secondary | ICD-10-CM

## 2019-07-22 DIAGNOSIS — Z951 Presence of aortocoronary bypass graft: Secondary | ICD-10-CM

## 2019-07-22 NOTE — Progress Notes (Signed)
[address]       [place] [address]             [phone]      Tonya Chandler [facility] Medical Record #[id number] Date of Birth: 08-09-1960  Referring: Emeterio Reeve, DO Primary Care: Emeterio Reeve, DO Primary Cardiologist: Jenne Campus, MD   Chief Complaint:   POST OP FOLLOW UP DATE OF PROCEDURE:  06/14/2019 PREOPERATIVE DIAGNOSIS:  Coronary occlusive disease with unstable angina. POSTOPERATIVE DIAGNOSIS:  Coronary occlusive disease with unstable angina. SURGICAL PROCEDURE:  Coronary artery bypass grafting x3 with the left internal mammary to the left anterior descending coronary artery, sequential reverse saphenous vein graft to the distal right coronary artery and distal circumflex coronary artery with  right greater saphenous thigh endoscopic vein harvesting.  History of Present Illness:     Patient returns to the office today and after recent coronary artery bypass grafting on May 24.  The patient is making good progress postoperatively.  She has had no recurrent angina.  She notes she has been increasing her physical activity, including taking several mile walks at a time.  She did present to the [facility] emergency room with significant nosebleed requiring packing ultimately it was cauterized by ENT.  She has had no recurrence.     Past Medical History:  Diagnosis Date  . Abnormal ECG 04/01/2018  . Abnormal stress echocardiogram 05/24/2019   Formatting of this note might be different from the original. Added automatically from request for surgery [id number]  . Anemia   . Anxiety   . Atherosclerosis of native coronary artery with angina pectoris ([place]) 05/10/2015   Formatting of this note might be different from the original. 30% lesion-Chiu  . Bilateral carpal tunnel syndrome 05/23/2017  . Bilateral knee pain 02/16/2010   Qualifier: Diagnosis of  By: Koleen Nimrod MD, Dellis Filbert    . Carpal tunnel syndrome, right 12/16/2017  . Colon polyps   .  Depression   . DEPRESSION 02/16/2010   Qualifier: Diagnosis of  By: Koleen Nimrod MD, Dellis Filbert    . Depression, major, in partial remission ([place]) 07/14/2015  . Diabetes mellitus   . Diverticulosis   . Dupuytren's contracture of right hand 06/24/2017  . Essential hypertension 02/16/2010   Qualifier: Diagnosis of  By: Koleen Nimrod MD, Dellis Filbert    . Gastro-esophageal reflux   . Gastrointestinal ulcer   . Generalized anxiety disorder 03/14/2015  . GERD 02/16/2010   Qualifier: Diagnosis of  By: Koleen Nimrod MD, Dellis Filbert    . H/O: GI bleed    Michela Pitcher it was due to taking metformin  . Hyperlipidemia   . Hyperlipidemia LDL goal <70 02/16/2010   Qualifier: Diagnosis of  By: Koleen Nimrod MD, Dellis Filbert    . Hypertension   . IBS (irritable bowel syndrome)   . Impingement syndrome of shoulder, right 06/24/2017  . Midline low back pain with left-sided sciatica 12/29/2013  . Musculoskeletal chest pain 05/04/2015  . Obesity   . Precordial pain 05/04/2015  . Primary insomnia 03/14/2015  . Radiculitis of right cervical region 01/20/2018  . Radiculopathy 04/02/2018  . Right hand pain 06/24/2017  . RLS (restless legs syndrome) 07/14/2015  . Type 2 diabetes mellitus with diabetic neuropathy, with long-term current use of insulin ([place]) 02/16/2010   Qualifier: Diagnosis of  By: Koleen Nimrod MD, Dellis Filbert       Social History   Tobacco Use  Smoking Status Never Smoker  Smokeless Tobacco Never Used    Social History   Substance  and Sexual Activity  Alcohol Use No     Allergies  Allergen Reactions  . Adhesive [Tape] Hives    Welts--PLEASE USE HYPO ALLERGIC TAPE  . Ampicillin Diarrhea    Did it involve swelling of the face/tongue/throat, SOB, or low BP? No Did it involve sudden or severe rash/hives, skin peeling, or any reaction on the inside of your mouth or nose? No Did you need to seek medical attention at a hospital or doctor's office? No When did it last happen?30-40 years ago If all above answers are "NO", may proceed  with cephalosporin use.   Marland Kitchen Cefprozil Other (See Comments)    Acid reflux/indigestion issues  . Meloxicam Other (See Comments)    Acid reflux/indigestion issues    Current Outpatient Medications  Medication Sig Dispense Refill  . acetaminophen (TYLENOL) 325 MG tablet Take 2 tablets (650 mg total) by mouth every 6 (six) hours as needed for mild pain.    Marland Kitchen aspirin EC 81 MG tablet Take 81 mg by mouth daily. Swallow whole.    Marland Kitchen atorvastatin (LIPITOR) 40 MG tablet Take 1 tablet (40 mg total) by mouth daily. (Patient taking differently: Take 40 mg by mouth at bedtime. ) 90 tablet 3  . Cyanocobalamin (VITAMIN B-12) 2500 MCG SUBL Place 2,500 mcg under the tongue daily.    . cyclobenzaprine (FLEXERIL) 10 MG tablet Take 1 tablet (10 mg total) by mouth at bedtime. 30 tablet 3  . diclofenac Sodium (VOLTAREN) 1 % GEL Apply 4 g topically 4 (four) times daily. To affected joint. 100 g 11  . Dulaglutide (TRULICITY) 1.5 UM/3.5TI SOPN Inject 1.5 mg into the skin every Sunday. 4 pen 11  . DULoxetine (CYMBALTA) 60 MG capsule Take 1 capsule (60 mg total) by mouth daily. 90 capsule 3  . esomeprazole (NEXIUM) 20 MG capsule Take 40 mg by mouth daily at 12 noon.     Marland Kitchen estradiol (ESTRACE) 1 MG tablet Take 1 mg by mouth at bedtime.     . Famotidine 20 MG CHEW Chew 20 mg by mouth daily as needed (reflux).     . gabapentin (NEURONTIN) 800 MG tablet 1 tab in the morning, 1 tab midday, 2 tabs at bedtime (Patient taking differently: Take 800 mg by mouth See admin instructions. 1 tab in the morning, 1 tab midday, 2 tabs at bedtime) 120 tablet 3  . Insulin Glargine (BASAGLAR KWIKPEN) 100 UNIT/ML Inject 0.2 mLs (20 Units total) into the skin daily. 5 pen 1  . insulin lispro (HUMALOG KWIKPEN) 200 UNIT/ML KwikPen Inject 5 units as directed prior to lunch and dinner (Patient taking differently: Inject 10 Units into the skin See admin instructions. Inject 10 units as directed prior to lunch and dinner) 2 pen 5  . INVOKANA 300 MG  TABS tablet Take 1 tablet (300 mg total) by mouth daily. 90 tablet 1  . lisinopril (ZESTRIL) 2.5 MG tablet Take 1 tablet (2.5 mg total) by mouth daily. 90 tablet 1  . loteprednol (LOTEMAX) 0.5 % ophthalmic suspension Place 1 drop into the right eye daily as needed (dry eye).     . magnesium oxide (MAG-OX) 400 MG tablet Take 800 mg by mouth at bedtime.     . metoprolol tartrate (LOPRESSOR) 25 MG tablet Take 1 tablet (25 mg total) by mouth 2 (two) times daily. 60 tablet 1  . mirtazapine (REMERON) 45 MG tablet Take 1 tablet (45 mg total) by mouth at bedtime. 90 tablet 3  . rOPINIRole (REQUIP) 0.5 MG tablet  Take 3 tablets (1.5 mg total) by mouth at bedtime. 270 tablet 3   No current facility-administered medications for this visit.       Physical Exam: BP (!) 140/92   Pulse 84   Temp 97.9 F (36.6 C) (Temporal)   Resp 16   Ht 5' (1.524 m)   Wt 149 lb (67.6 kg)   SpO2 94% Comment: RA  BMI 29.10 kg/m   General appearance: alert and cooperative Neurologic: intact Heart: regular rate and rhythm, S1, S2 normal, no murmur, click, rub or gallop Lungs: clear to auscultation bilaterally Abdomen: soft, non-tender; bowel sounds normal; no masses,  no organomegaly Extremities: extremities normal, atraumatic, no cyanosis or edema and Homans sign is negative, no sign of DVT Wound: Sternum is stable and well-healed chest tube sutures were removed   Diagnostic Studies & Laboratory data:     Recent Radiology Findings:   DG Chest 2 View  Result Date: 07/22/2019 CLINICAL DATA:  Status post coronary bypass graft. EXAM: CHEST - 2 VIEW COMPARISON:  Jun 19, 2019. FINDINGS: Stable cardiomediastinal silhouette. No pneumothorax or pleural effusion noted. Both lungs are clear. The visualized skeletal structures are unremarkable. IMPRESSION: No active cardiopulmonary disease. Electronically Signed   By: Marijo Conception M.D.   On: 07/22/2019 08:48    I have independently reviewed the above radiology studies   and reviewed the findings with the patient.   Recent Lab Findings: Lab Results  Component Value Date   WBC 13.1 (H) 06/17/2019   HGB 10.0 (L) 06/17/2019   HCT 30.8 (L) 06/17/2019   PLT 231 06/17/2019   GLUCOSE 164 (H) 07/05/2019   CHOL 216 (H) 09/30/2018   TRIG 421 (H) 09/30/2018   HDL 58 09/30/2018   [place]  09/30/2018     Comment:     . LDL cholesterol not calculated. Triglyceride levels greater than 400 mg/dL invalidate calculated LDL results. . Reference range: <100 . Desirable range <100 mg/dL for primary prevention;   <70 mg/dL for patients with CHD or diabetic patients  with > or = 2 CHD risk factors. Marland Kitchen LDL-C is now calculated using the Martin-Hopkins  calculation, which is a validated novel method providing  better accuracy than the Friedewald equation in the  estimation of LDL-C.  Cresenciano Genre et al. Annamaria Helling. 0300;923(30): 2061-2068  (http://education.[facility].com/faq/FAQ164)    ALT 13 06/14/2019   AST 32 06/14/2019   NA 140 07/05/2019   K 4.4 07/05/2019   CL 99 07/05/2019   CREATININE 0.57 07/05/2019   BUN 11 07/05/2019   CO2 23 07/05/2019   INR 1.2 06/14/2019   HGBA1C 7.6 (A) 06/28/2019      Assessment / Plan:   1/Patient doing well now approximately 5 weeks following coronary artery bypass grafting.  She will return to working from home July 19 and return to full duties August 2.  2/diabetes mellitus-she notes currently her glucoses are running between 108 and 140 she has been back to primary care since discharge for continued diabetic regiment management   With the patient has good primary care and cardiology follow-up I am not made a return appointment in the surgery office but would be glad to see her as needed.   Medication Changes: No orders of the defined types were placed in this encounter.     [name] MD      [address] ,[place] [address] Office [phone]     07/22/2019 10:12 AM

## 2019-07-22 NOTE — Patient Instructions (Signed)
    [address]       [place] [address]             [phone]       Coronary Artery Bypass Grafting  Care After  Refer to this sheet in the next few weeks. These instructions provide you with information on caring for yourself after your procedure. Your caregiver may also give you more specific instructions. Your treatment has been planned according to current medical practices, but problems sometimes occur. Call your caregiver if you have any problems or questions after your procedure.  Recovery from open heart surgery will be different for everyone. Some people feel well after 3 or 4 weeks, while for others it takes longer. After heart surgery, it may be normal to:  Not have an appetite, feel nauseated by the smell of food, or only want to eat a small amount.   Be constipated because of changes in your diet, activity, and medicines. Eat foods high in fiber. Add fresh fruits and vegetables to your diet. Stool softeners may be helpful.   Feel sad or unhappy. You may be frustrated or cranky. You may have good days and bad days. Do not give up. Talk to your caregiver if you do not feel better.   Feel weakness and fatigue. You many need physical therapy or cardiac rehabilitation to get your strength back.   Develop an irregular heartbeat called atrial fibrillation. Symptoms of atrial fibrillation are a fast, irregular heartbeat or feelings of fluttery heartbeats, shortness of breath, low blood pressure, and dizziness. If these symptoms develop, see your caregiver right away.  MEDICATION  Have a list of all the medicines you will be taking when you leave the hospital. For every medicine, know the following:   Name.   Exact dose.   Time of day to be taken.   How often it should be taken.   Why you are taking it.   Ask which medicines should or should not be taken together. If you take more than one heart medicine, ask if it is okay to take them together. Some  heart medicines should not be taken at the same time because they may lower your blood pressure too much.   Narcotic pain medicine can cause constipation. Eat fresh fruits and vegetables. Add fiber to your diet. Stool softener medicine may help relieve constipation.   Keep a copy of your medicines with you at all times.   Do not add or stop taking any medicine until you check with your caregiver.   Medicines can have side effects. Call your caregiver who prescribed the medicine if you:   Start throwing up, have diarrhea, or have stomach pain.   Feel dizzy or lightheaded when you stand up.   Feel your heart is skipping beats or is beating too fast or too slow.   Develop a rash.   Notice unusual bruising or bleeding.  HOME CARE INSTRUCTIONS  After heart surgery, it is important to learn how to take your pulse. Have your caregiver show you how to take your pulse.   Use your incentive spirometer. Ask your caregiver how long after surgery you need to use it.  Care of your chest incision  Tell your caregiver right away if you notice clicking in your chest (sternum).   Support your chest with a pillow or your arms when you take deep breaths and cough.   Follow your caregiver's instructions about when you can bathe or   swim.   Protect your incision from sunlight during the first year to keep the scar from getting dark.   Tell your caregiver if you notice:   Increased tenderness of your incision.   Increased redness or swelling around your incision.   Drainage or pus from your incision.  Care of your leg incision(s)  Avoid crossing your legs.   Avoid sitting for long periods of time. Change positions every half hour.   Elevate your leg(s) when you are sitting.   Check your leg(s) daily for swelling. Check the incisions for redness or drainage.   Diet is very important to heart health.   Eat plenty of fresh fruits and vegetables. Meats should be lean cut. Avoid canned,  processed, and fried foods.   Talk to a dietician. They can teach you how to make healthy food and drink choices.  Weight  Weigh yourself every day. This is important because it helps to know if you are retaining fluid that may make your heart and lungs work harder.   Use the same scale each time.   Weigh yourself every morning at the same time. You should do this after you go to the bathroom, but before you eat breakfast.   Your weight will be more accurate if you do not wear any clothes.   Record your weight.   Tell your caregiver if you have gained 2 pounds or more overnight.  Activity Stop any activity at once if you have chest pain, shortness of breath, irregular heartbeats, or dizziness. Get help right away if you have any of these symptoms.  Bathing.  Avoid soaking in a bath or hot tub until your incisions are healed.   Rest. You need a balance of rest and activity.   Exercise. Exercise per your caregiver's advice. You may need physical therapy or cardiac rehabilitation to help strengthen your muscles and build your endurance.   Climbing stairs. Unless your caregiver tells you not to climb stairs, go up stairs slowly and rest if you tire. Do not pull yourself up by the handrail.   Driving a car. Follow your caregiver's advice on when you may drive. You may ride as a passenger at any time. When traveling for long periods of time in a car, get out of the car and walk around for a few minutes every 2 hours.   Lifting. Avoid lifting, pushing, or pulling anything heavier than 10 pounds for 6 weeks after surgery or as told by your caregiver.   Returning to work. Check with your caregiver. People heal at different rates. Most people will be able to go back to work 6 to 12 weeks after surgery.   Sexual activity. You may resume sexual relations as told by your caregiver.  SEEK MEDICAL CARE IF:  Any of your incisions are red, painful, or have any type of drainage coming from them.     You have an oral temperature above 101.5 F .   You have ankle or leg swelling.   You have pain in your legs.   You have weight gain of 2 or more pounds a day.   You feel dizzy or lightheaded when you stand up.  SEEK IMMEDIATE MEDICAL CARE IF:  You have angina or chest pain that goes to your jaw or arms. Call your local emergency services right away.   You have shortness of breath at rest or with activity.   You have a fast or irregular heartbeat (arrhythmia).   There is   a "clicking" in your sternum when you move.   You have numbness or weakness in your arms or legs.  MAKE SURE YOU:  Understand these instructions.   Will watch your condition.   Will get help right away if you are not doing well or get worse.    No lifting over 25 lbs for 3 months 

## 2019-08-24 ENCOUNTER — Ambulatory Visit: Payer: BC Managed Care – PPO | Admitting: Cardiology

## 2019-08-26 ENCOUNTER — Ambulatory Visit (INDEPENDENT_AMBULATORY_CARE_PROVIDER_SITE_OTHER): Payer: BC Managed Care – PPO | Admitting: Cardiology

## 2019-08-26 ENCOUNTER — Other Ambulatory Visit: Payer: Self-pay

## 2019-08-26 ENCOUNTER — Encounter: Payer: Self-pay | Admitting: Cardiology

## 2019-08-26 VITALS — BP 122/80 | HR 60 | Ht 60.0 in | Wt 154.8 lb

## 2019-08-26 DIAGNOSIS — I10 Essential (primary) hypertension: Secondary | ICD-10-CM

## 2019-08-26 DIAGNOSIS — E1140 Type 2 diabetes mellitus with diabetic neuropathy, unspecified: Secondary | ICD-10-CM

## 2019-08-26 DIAGNOSIS — E785 Hyperlipidemia, unspecified: Secondary | ICD-10-CM

## 2019-08-26 DIAGNOSIS — Z951 Presence of aortocoronary bypass graft: Secondary | ICD-10-CM | POA: Diagnosis not present

## 2019-08-26 DIAGNOSIS — I25119 Atherosclerotic heart disease of native coronary artery with unspecified angina pectoris: Secondary | ICD-10-CM

## 2019-08-26 DIAGNOSIS — Z794 Long term (current) use of insulin: Secondary | ICD-10-CM

## 2019-08-26 NOTE — Progress Notes (Signed)
Cardiology Office Note:    Date:  08/26/2019   ID:  Tonya Chandler, DOB 1960-11-02, MRN [id number]  PCP:  Emeterio Reeve, DO  Cardiologist:  Jenne Campus, MD    Referring MD: Emeterio Reeve, DO   No chief complaint on file. Doing much better  History of Present Illness:    Tonya Chandler is a 59 y.o. female  with past medical history significant for essential hypertension, dyslipidemia, diabetes mellitus.  She comes today to my office for follow-up after cardiac catheterization has been done.  Cardiac catheterization showed 40% stenosis of left main, 95% stenosis LAD, completely occluded distal circumflex, 95% of distal RCA.  She ended up having coronary artery bypass graft with LIMA to LAD and sequential SVG to RCA and circumflex.  Comes today 2 months of follow-up overall doing great.  Denies have any chest pain tightness squeezing pressure burning chest.  She denies a [place] the [place] last few weeks and she was able to walk and climb with no major difficulties.  She did have some issue with nosebleeding before but that is being an old problem and not anymore.  Overall she is very satisfied the way she feels.  Past Medical History:  Diagnosis Date  . Abnormal ECG 04/01/2018  . Abnormal stress echocardiogram 05/24/2019   Formatting of this note might be different from the original. Added automatically from request for surgery [id number]  . Anemia   . Anxiety   . Atherosclerosis of native coronary artery with angina pectoris ([place]) 05/10/2015   Formatting of this note might be different from the original. 30% lesion-Chiu  . Bilateral carpal tunnel syndrome 05/23/2017  . Bilateral knee pain 02/16/2010   Qualifier: Diagnosis of  By: Koleen Nimrod MD, Dellis Filbert    . Carpal tunnel syndrome, right 12/16/2017  . Colon polyps   . Depression   . DEPRESSION 02/16/2010   Qualifier: Diagnosis of  By: Koleen Nimrod MD, Dellis Filbert    . Depression, major, in partial remission ([place]) 07/14/2015  . Diabetes mellitus    . Diverticulosis   . Dupuytren's contracture of right hand 06/24/2017  . Essential hypertension 02/16/2010   Qualifier: Diagnosis of  By: Koleen Nimrod MD, Dellis Filbert    . Gastro-esophageal reflux   . Gastrointestinal ulcer   . Generalized anxiety disorder 03/14/2015  . GERD 02/16/2010   Qualifier: Diagnosis of  By: Koleen Nimrod MD, Dellis Filbert    . H/O: GI bleed    Michela Pitcher it was due to taking metformin  . Hyperlipidemia   . Hyperlipidemia LDL goal <70 02/16/2010   Qualifier: Diagnosis of  By: Koleen Nimrod MD, Dellis Filbert    . Hypertension   . IBS (irritable bowel syndrome)   . Impingement syndrome of shoulder, right 06/24/2017  . Midline low back pain with left-sided sciatica 12/29/2013  . Musculoskeletal chest pain 05/04/2015  . Obesity   . Precordial pain 05/04/2015  . Primary insomnia 03/14/2015  . Radiculitis of right cervical region 01/20/2018  . Radiculopathy 04/02/2018  . Right hand pain 06/24/2017  . RLS (restless legs syndrome) 07/14/2015  . Type 2 diabetes mellitus with diabetic neuropathy, with long-term current use of insulin ([place]) 02/16/2010   Qualifier: Diagnosis of  By: Koleen Nimrod MD, Dellis Filbert      Past Surgical History:  Procedure Laterality Date  . ABDOMINAL HYSTERECTOMY    . ANTERIOR CERVICAL DECOMP/DISCECTOMY FUSION N/A 04/02/2018   Procedure: ANTERIOR CERVICAL DECOMPRESSION FUSION, CERVICAL FOUR-FIVE, CERVICAL FIVE-SIX, WITH INSTRUMENTATION AND ALLOGRAFT;  Surgeon: Phylliss Bob, MD;  Location: [facility];  Service: Orthopedics;  Laterality: N/A;  . COLONOSCOPY WITH ESOPHAGOGASTRODUODENOSCOPY (EGD)  05/04/2009  . CORONARY ARTERY BYPASS GRAFT N/A 06/14/2019   Procedure: CORONARY ARTERY BYPASS GRAFTING (CABG) using LIMA to LAD; Endoscopic Right Greater Saphenous Vein Harvest: SVG to Circ (distal); SVG to RCA (distal).;  Surgeon: [name], MD;  Location: [facility];  Service: Open Heart Surgery;  Laterality: N/A;  . ENDOVEIN HARVEST OF GREATER SAPHENOUS VEIN Right 06/14/2019   Procedure: [place] Of Greater Saphenous Vein;  Surgeon: [name], MD;  Location: [facility];  Service: Open Heart Surgery;  Laterality: Right;  . LEFT HEART CATH AND CORONARY ANGIOGRAPHY N/A 06/11/2019   Procedure: LEFT HEART CATH AND CORONARY ANGIOGRAPHY;  Surgeon: Wellington Hampshire, MD;  Location: [facility];  Service: Cardiovascular;  Laterality: N/A;  . PIP JOINT FUSION Bilateral    pinky  . TEE WITHOUT CARDIOVERSION N/A 06/14/2019   Procedure: TRANSESOPHAGEAL ECHOCARDIOGRAM (TEE);  Surgeon: [name], MD;  Location: [facility];  Service: Open Heart Surgery;  Laterality: N/A;    Current Medications: Current Meds  Medication Sig  . acetaminophen (TYLENOL) 325 MG tablet Take 2 tablets (650 mg total) by mouth every 6 (six) hours as needed for mild pain.  Marland Kitchen aspirin EC 81 MG tablet Take 81 mg by mouth daily. Swallow whole.  Marland Kitchen atorvastatin (LIPITOR) 40 MG tablet Take 1 tablet (40 mg total) by mouth daily. (Patient taking differently: Take 40 mg by mouth at bedtime. )  . Cyanocobalamin (VITAMIN B-12) 2500 MCG SUBL Place 2,500 mcg under the tongue daily.  . cyclobenzaprine (FLEXERIL) 10 MG tablet Take 1 tablet (10 mg total) by mouth at bedtime.  . diclofenac Sodium (VOLTAREN) 1 % GEL Apply 4 g topically 4 (four) times daily. To affected joint.  . Dulaglutide (TRULICITY) 1.5 KD/3.2IZ SOPN Inject 1.5 mg into the skin every Sunday.  . DULoxetine (CYMBALTA) 60 MG capsule Take 1 capsule (60 mg total) by mouth daily.  Marland Kitchen esomeprazole (NEXIUM) 20 MG capsule Take 40 mg by mouth daily at 12 noon.   Marland Kitchen estradiol (ESTRACE) 1 MG tablet Take 1 mg by mouth at bedtime.   . Famotidine 20 MG CHEW Chew 20 mg by mouth daily as needed (reflux).   . gabapentin (NEURONTIN) 800 MG tablet 1 tab in the morning, 1 tab midday, 2 tabs at bedtime (Patient taking differently: Take 800 mg by mouth See admin instructions. 1 tab in the morning, 1 tab midday, 2 tabs at bedtime)  . Insulin Glargine (BASAGLAR KWIKPEN) 100 UNIT/ML  Inject 0.2 mLs (20 Units total) into the skin daily.  . insulin lispro (HUMALOG KWIKPEN) 200 UNIT/ML KwikPen Inject 5 units as directed prior to lunch and dinner (Patient taking differently: Inject 10 Units into the skin See admin instructions. Inject 10 units as directed prior to lunch and dinner)  . INVOKANA 300 MG TABS tablet Take 1 tablet (300 mg total) by mouth daily.  Marland Kitchen lisinopril (ZESTRIL) 2.5 MG tablet Take 1 tablet (2.5 mg total) by mouth daily.  Marland Kitchen loteprednol (LOTEMAX) 0.5 % ophthalmic suspension Place 1 drop into the right eye daily as needed (dry eye).   . magnesium oxide (MAG-OX) 400 MG tablet Take 800 mg by mouth at bedtime.   . metoprolol tartrate (LOPRESSOR) 25 MG tablet Take 1 tablet (25 mg total) by mouth 2 (two) times daily.  . mirtazapine (REMERON) 45 MG tablet Take 1 tablet (45 mg total) by mouth at bedtime.     Allergies:   Adhesive [  tape], Ampicillin, Cefprozil, and Meloxicam   Social History   Socioeconomic History  . Marital status: Married    Spouse name: Not on file  . Number of children: 2  . Years of education: Not on file  . Highest education level: Not on file  Occupational History  . Occupation: Tourist information centre manager: [employer]   Tobacco Use  . Smoking status: Never Smoker  . Smokeless tobacco: Never Used  Vaping Use  . Vaping Use: Never used  Substance and Sexual Activity  . Alcohol use: No  . Drug use: No  . Sexual activity: Yes    Partners: Male  Other Topics Concern  . Not on file  Social History Narrative  . Not on file   Social Determinants of Health   Financial Resource Strain:   . Difficulty of Paying Living Expenses:   Food Insecurity:   . Worried About Charity fundraiser in the Last Year:   . Arboriculturist in the Last Year:   Transportation Needs:   . Film/video editor (Medical):   Marland Kitchen Lack of Transportation (Non-Medical):   Physical Activity:   . Days of Exercise per Week:   . Minutes of Exercise per Session:     Stress:   . Feeling of Stress :   Social Connections:   . Frequency of Communication with Friends and Family:   . Frequency of Social Gatherings with Friends and Family:   . Attends Religious Services:   . Active Member of Clubs or Organizations:   . Attends Archivist Meetings:   Marland Kitchen Marital Status:      Family History: [name] family history includes Colon cancer in her mother; Colon polyps (age of onset: 71) in her daughter; Dementia in her mother; Diabetes in her brother, father, and mother; Epilepsy in her brother; Heart attack in her brother, father, and mother; High blood pressure in her brother and mother; Ovarian cancer in her mother. There is no history of Esophageal cancer, Rectal cancer, Stomach cancer, or Heart disease. ROS:   Please see the history of present illness.    All 14 point review of systems negative except as described per history of present illness  EKGs/Labs/Other Studies Reviewed:      Recent Labs: 06/14/2019: ALT 13 06/15/2019: Magnesium 1.7 06/17/2019: Hemoglobin 10.0; Platelets 231 07/05/2019: BUN 11; Creatinine, Ser 0.57; Potassium 4.4; Sodium 140  Recent Lipid Panel    Component Value Date/Time   CHOL 216 (H) 09/30/2018 1218   TRIG 421 (H) 09/30/2018 1218   HDL 58 09/30/2018 1218   CHOLHDL 3.7 09/30/2018 1218   [place]  09/30/2018 1218     Comment:     . LDL cholesterol not calculated. Triglyceride levels greater than 400 mg/dL invalidate calculated LDL results. . Reference range: <100 . Desirable range <100 mg/dL for primary prevention;   <70 mg/dL for patients with CHD or diabetic patients  with > or = 2 CHD risk factors. Marland Kitchen LDL-C is now calculated using the Martin-Hopkins  calculation, which is a validated novel method providing  better accuracy than the Friedewald equation in the  estimation of LDL-C.  Cresenciano Genre et al. Annamaria Helling. 8127;517(00): 2061-2068  (http://education.[employer].com/faq/FAQ164)     Physical  Exam:    VS:  BP 122/80 (BP Location: Left Arm, Patient Position: Sitting, Cuff Size: Normal)   Pulse 60   Ht 5' (1.524 m)   Wt 154 lb 12.8 oz (70.2 kg)  SpO2 (!) 87%   BMI 30.23 kg/m     Wt Readings from Last 3 Encounters:  08/26/19 154 lb 12.8 oz (70.2 kg)  07/22/19 149 lb (67.6 kg)  07/05/19 148 lb (67.1 kg)     GEN:  Well nourished, well developed in no acute distress HEENT: Normal NECK: No JVD; No carotid bruits LYMPHATICS: No lymphadenopathy CARDIAC: RRR, no murmurs, no rubs, no gallops RESPIRATORY:  Clear to auscultation without rales, wheezing or rhonchi  ABDOMEN: Soft, non-tender, non-distended MUSCULOSKELETAL:  No edema; No deformity  SKIN: Warm and dry LOWER EXTREMITIES: no swelling NEUROLOGIC:  Alert and oriented x 3 PSYCHIATRIC:  Normal affect   ASSESSMENT:    1. Atherosclerosis of native coronary artery with angina pectoris, unspecified whether native or transplanted heart ([place])   2. S/P CABG x 3 LIMA to LAD, SVG sequential to RCA and circumflex in May 2021   3. Type 2 diabetes mellitus with diabetic neuropathy, with long-term current use of insulin ([place])   4. Essential hypertension   5. Hyperlipidemia LDL goal <70    PLAN:    In order of problems listed above:  1. Atherosclerosis status post coronary artery bypass graft.  Wound healed beautifully.  She is on antiplatelet therapy which I will continue.  She is asymptomatic doing well.  The key now will be risk factors modifications. 2. Type 2 diabetes.  She said that she is back at work she goes there just twice a week and because of that she said that her sugar seems to be not as well-controlled as it was before.  I told her that this is absolutely essential to keep her sugar well controlled. 3. Essential hypertension blood pressures well controlled continue present management. 4. Dyslipidemia: We will ask her to have fasting lipid profile today. 5. We did talk in length about benefits of rehab and I  strongly encouraged her to participate in it.   Medication Adjustments/Labs and Tests Ordered: Current medicines are reviewed at length with the patient today.  Concerns regarding medicines are outlined above.  Orders Placed This Encounter  Procedures  . Lipid panel   Medication changes: No orders of the defined types were placed in this encounter.   Signed, Park Liter, MD, [facility] 08/26/2019 3:55 PM    [place]

## 2019-08-26 NOTE — Patient Instructions (Signed)
Medication Instructions:  Your physician recommends that you continue on your current medications as directed. Please refer to the Current Medication list given to you today.  *If you need a refill on your cardiac medications before your next appointment, please call your pharmacy*   Lab Work: Your physician recommends that you return for lab work today: lipid  If you have labs (blood work) drawn today and your tests are completely normal, you will receive your results only by: . MyChart Message (if you have MyChart) OR . A paper copy in the mail If you have any lab test that is abnormal or we need to change your treatment, we will call you to review the results.   Testing/Procedures: None   Follow-Up: At [facility], you and your health needs are our priority.  As part of our continuing mission to provide you with exceptional heart care, we have created designated Provider Care Teams.  These Care Teams include your primary Cardiologist (physician) and Advanced Practice Providers (APPs -  Physician Assistants and Nurse Practitioners) who all work together to provide you with the care you need, when you need it.  We recommend signing up for the patient portal called "MyChart".  Sign up information is provided on this After Visit Summary.  MyChart is used to connect with patients for Virtual Visits (Telemedicine).  Patients are able to view lab/test results, encounter notes, upcoming appointments, etc.  Non-urgent messages can be sent to your provider as well.   To learn more about what you can do with MyChart, go to [email or web address].    Your next appointment:   3 month(s)  The format for your next appointment:   In Person  Provider:   Robert Krasowski, MD   Other Instructions   

## 2019-08-31 ENCOUNTER — Other Ambulatory Visit: Payer: Self-pay | Admitting: Cardiology

## 2019-08-31 DIAGNOSIS — I25119 Atherosclerotic heart disease of native coronary artery with unspecified angina pectoris: Secondary | ICD-10-CM | POA: Diagnosis not present

## 2019-08-31 LAB — LIPID PANEL
Chol/HDL Ratio: 3.1 ratio (ref 0.0–4.4)
Cholesterol, Total: 150 mg/dL (ref 100–199)
HDL: 49 mg/dL (ref >39)
LDL Chol Calc (NIH): 61 mg/dL (ref 0–99)
Triglycerides: 255 mg/dL — ABNORMAL HIGH (ref 0–149)
VLDL Cholesterol Cal: 40 mg/dL (ref 5–40)

## 2019-08-31 MED ORDER — METOPROLOL TARTRATE 25 MG PO TABS: 25.0000 mg | ORAL_TABLET | Freq: Two times a day (BID) | ORAL | 3 refills | Status: DC

## 2019-08-31 NOTE — Telephone Encounter (Signed)
*  STAT* If patient is at the pharmacy, call can be transferred to refill team.   1. Which medications need to be refilled? (please list name of each medication and dose if known) metoprolol tartrate (LOPRESSOR) 25 MG tablet  2. Which pharmacy/location (including street and city if local pharmacy) is medication to be sent to? [id number] - [place], [place] - [address] AT [address]  3. Do they need a 30 day or 90 day supply? 90 day   Patient is out of medication

## 2019-08-31 NOTE — Telephone Encounter (Signed)
Refill sent in per request.  

## 2019-09-06 ENCOUNTER — Other Ambulatory Visit: Payer: Self-pay

## 2019-09-06 DIAGNOSIS — E1140 Type 2 diabetes mellitus with diabetic neuropathy, unspecified: Secondary | ICD-10-CM

## 2019-09-06 DIAGNOSIS — IMO0002 Reserved for concepts with insufficient information to code with codable children: Secondary | ICD-10-CM

## 2019-09-06 NOTE — Telephone Encounter (Signed)
Patient left msg needing refill on Basaglar. Last visit she was on 20 units from where she was decreased at hospital. Patient stated in message she is now back up to 50 units and needs refill of Basaglar and pen needles. I called and left patient a message asking when she tapered back up and what her current fasting sugars are

## 2019-09-07 NOTE — Telephone Encounter (Signed)
Patient had gone down to 20 units due to not eating in hospital. Once discharged and her eating regulated, she increased back up to 50 units  RX pended with 50 unit dose, please send

## 2019-09-08 ENCOUNTER — Telehealth: Payer: Self-pay

## 2019-09-08 DIAGNOSIS — E1140 Type 2 diabetes mellitus with diabetic neuropathy, unspecified: Secondary | ICD-10-CM

## 2019-09-08 DIAGNOSIS — IMO0002 Reserved for concepts with insufficient information to code with codable children: Secondary | ICD-10-CM

## 2019-09-08 MED ORDER — BASAGLAR KWIKPEN 100 UNIT/ML ~~LOC~~ SOPN: 20.0000 [IU] | PEN_INJECTOR | Freq: Every day | SUBCUTANEOUS | 2 refills | Status: DC

## 2019-09-08 NOTE — Telephone Encounter (Signed)
Pt called stating that she ran out of [place] rx yesterday. Requesting an urgent rx refill. Pls review previous telephone encounter for additional information. Thanks.

## 2019-09-08 NOTE — Telephone Encounter (Signed)
Task completed. Left a vm msg for pt regarding rx refill. Pt aware to return a call back regarding hyperglycemia episode. Direct call back info provided.

## 2019-09-08 NOTE — Telephone Encounter (Signed)
Opened in error. See other telephone message.

## 2019-09-08 NOTE — Telephone Encounter (Signed)
Tonya Chandler left a message stating her blood sugar is 400 mg/dl. I tried to call patient, no answer. I did leave a message to return call.

## 2019-09-08 NOTE — Telephone Encounter (Signed)
Refill sent.

## 2019-09-13 ENCOUNTER — Other Ambulatory Visit: Payer: Self-pay | Admitting: Sports Medicine

## 2019-09-14 ENCOUNTER — Other Ambulatory Visit: Payer: Self-pay

## 2019-09-14 MED ORDER — PEN NEEDLES 31G X 8 MM MISC: 99 refills | Status: DC

## 2019-09-14 NOTE — Telephone Encounter (Signed)
Sent in pen needles 

## 2019-09-15 ENCOUNTER — Encounter: Payer: Self-pay | Admitting: Osteopathic Medicine

## 2019-09-15 ENCOUNTER — Other Ambulatory Visit: Payer: Self-pay

## 2019-09-15 ENCOUNTER — Ambulatory Visit (INDEPENDENT_AMBULATORY_CARE_PROVIDER_SITE_OTHER): Payer: BC Managed Care – PPO | Admitting: Osteopathic Medicine

## 2019-09-15 VITALS — BP 127/76 | HR 77 | Wt 154.0 lb

## 2019-09-15 DIAGNOSIS — R1012 Left upper quadrant pain: Secondary | ICD-10-CM | POA: Diagnosis not present

## 2019-09-15 DIAGNOSIS — R1031 Right lower quadrant pain: Secondary | ICD-10-CM

## 2019-09-15 DIAGNOSIS — R197 Diarrhea, unspecified: Secondary | ICD-10-CM

## 2019-09-15 DIAGNOSIS — R0781 Pleurodynia: Secondary | ICD-10-CM

## 2019-09-15 DIAGNOSIS — R0789 Other chest pain: Secondary | ICD-10-CM | POA: Diagnosis not present

## 2019-09-15 DIAGNOSIS — Z951 Presence of aortocoronary bypass graft: Secondary | ICD-10-CM | POA: Diagnosis not present

## 2019-09-15 MED ORDER — METRONIDAZOLE 500 MG PO TABS
500.0000 mg | ORAL_TABLET | Freq: Three times a day (TID) | ORAL | 0 refills | Status: DC
Start: 2019-09-15 — End: 2019-09-30

## 2019-09-15 MED ORDER — CIPROFLOXACIN HCL 500 MG PO TABS
500.0000 mg | ORAL_TABLET | Freq: Two times a day (BID) | ORAL | 0 refills | Status: DC
Start: 2019-09-15 — End: 2019-09-30

## 2019-09-15 NOTE — Progress Notes (Signed)
Tonya Chandler is a 59 y.o. female who presents to  [facility]  today, 09/15/19, seeking care for the following:  . Diarrhea, 3-5 days, R lower abdominal pain. Has switched to bland diet, using imodium and pepto-bismol, stool is dark green but no blood/melena. Pain is intermittent. She reports L lower ribcage area pain since surgery (s/p CABG 06/14/2019)     ASSESSMENT & PLAN with other pertinent findings:  The primary encounter diagnosis was Left upper quadrant abdominal pain. Diagnoses of Right lower quadrant abdominal pain, Rib pain on left side, S/P CABG x 3 LIMA to LAD, SVG sequential to RCA and circumflex in May 2021, Other chest pain, and Diarrhea, unspecified type were also pertinent to this visit.   No results found for this or any previous visit (from the past 24 hour(s)).     Patient Instructions  Plan:  Labs and CT scan today! Symptoms seem more like infection: gastroenteritis (viral food poisoning) or possibly colitis (bacterial colon infection or diverticulitis). Maybe pancreatitis (inflammation of the pancreas).   If colitis - please fill printed Rx for antibiotics plus dietary plan as below, plenty of hydration!   If other (gastroenteritis or pancreatitis) - dietary plan as below (clear liquids for fe days, then advance to full liquid for a few days, then advance to bland diet as tolerated), plenty of hydration!   If severe pain, especially if fever or weakness, or other concerns, seek emergency medical attention!      Clear Liquid Diet, Adult A clear liquid diet is a diet that includes only liquids and semi-liquids that you can see through. You do not eat any food on this diet. Most people need to follow this diet for only a short period of time. You may need to follow a clear liquid diet if:  You develop a medical condition right before or after you have surgery.  You were not able to eat food for a long  period of time.  You had a condition that gave you nausea, vomiting, or diarrhea.  You are going to have a procedure or exam to look at parts of your digestive system.  You are going to have bowel surgery. The usual goals of this diet are:  To rest the stomach and digestive system as much as possible.  To help you clear the digestive system before a procedure or exam.  To keep you hydrated.  To make sure you get some calories for energy.  To help you return to your normal eating routine. What are tips for following this plan?  A clear liquid is a liquid or semi-liquid, such as gelatin, that you can see through when you hold it up to a light.  A clear liquid diet does not provide all the nutrients that you need. It is important to choose a variety of the liquids or semi-liquids that are allowed on this diet. That way, you will get as many nutrients as possible.  If you are not sure whether you can have certain items, ask your health care provider. If you have difficulty swallowing thin liquids, you will need to thicken them to prevent breathing in (aspiration). What foods should I eat?   Water and flavored water.  Fruit juices that do not have pulp, such as cranberry juice and apple juice.  Tea and coffee without milk or cream.  Clear bouillon or broth.  Broth-based soups that have been strained.  Flavored gelatin.  Honey.  Sugar water.  Ice or frozen ice pops that do not contain milk, yogurt, fruit pieces, or fruit pulp.  Clear sodas.  Clear sports drinks. The items listed above may not be a complete list of recommended foods and beverages. Contact a dietitian for more information. What food should I avoid?  Juices that have pulp.  Milk.  Cream or cream-based soups.  Yogurt.  Regular foods that are not clear liquids or semi-liquids. The items listed above may not be a complete list of foods and beverages to avoid. Contact a dietitian for more  information. Questions to ask your health care provider:  How long do I need to follow this diet?  Are there any medicines that I should change while on this diet? Summary  A clear liquid diet is a diet that includes liquids and semi-liquids that you can see through.  The goal of this diet is to help you recover by resting your digestive system, keeping you hydrated, and providing nutrients.  Avoid liquids with milk, cream, or pulp while on this diet. This information is not intended to replace advice given to you by your health care provider. Make sure you discuss any questions you have with your health care provider. Document Revised: 06/30/2017 Document Reviewed: 06/30/2017 Elsevier Patient Education  [place].   Full Liquid Diet A full liquid diet refers to fluids and foods that are liquid or will become liquid at room temperature. This diet should only be used for a short period of time to help you recover from illness or surgery. Your health care provider or dietitian will help you determine when it is safe to eat regular foods. What are tips for following this plan?     Reading food labels  Check food labels of nutrition shakes for the amount of protein. Look for nutrition shakes that have at least 8-10 grams of protein in each serving.  Look for drinks, such as milks and juices, that are "fortified" or "enriched." This means that vitamins and minerals have been added. Shopping  Buy premade nutritional shakes to keep on hand.  To vary your choices, buy different flavors of milks and shakes. Meal planning  Choose flavors and foods that you enjoy.  To make sure you get enough energy from food (calories): ? Eat 3 full liquid meals each day. Have a liquid snack between each meal. ? Drink 6-8 ounces (177-237 ml) of a nutrition supplement shake with meals or as snacks. ? Add protein powder, powdered milk, milk, or yogurt to shakes to increase the amount of  protein.  Drink at least one serving a day of citrus fruit juice or fruit juice that has vitamin C added. General guidelines  Before starting the full-liquid diet, check with your health care provider to know what foods you should avoid. These may include full-fat or high-fiber liquids.  You may have any liquid or food that becomes a liquid at room temperature. The food is considered a liquid if it can be poured off a spoon at room temperature.  Do not drink alcohol unless approved by your health care provider.  This diet gives you most of the nutrients that you need for energy, but you may not get enough of certain vitamins, minerals, and fiber. Make sure to talk to your health care provider or dietitian about: ? How many calories you need to eat get day. ? How much fluid you should have each day. ? Taking a multivitamin or a nutritional supplement. What  foods are allowed? The items listed may not be a complete list. Talk with your dietitian about what dietary choices are best for you. Grains Thin hot cereal, such as cream of wheat. Soft-cooked pasta or rice pured in soup. Vegetables Pulp-free tomato or vegetable juice. Vegetables pured in soup. Fruits Fruit juice without pulp. Strained fruit pures (seeds and skins removed). Meats and other protein foods Beef, chicken, and fish broths. Powdered protein supplements. Dairy Milk and milk-based beverages, including milk shakes and instant breakfast mixes. Smooth yogurt. Pured cottage cheese. Beverages Water. Coffee and tea (caffeinated or decaffeinated). Cocoa. Liquid nutritional supplements. Soft drinks. Nondairy milks, such as almond, coconut, rice, or soy milk. Fats and oils Melted margarine and butter. Cream. [place], almond, avocado, corn, grapeseed, sunflower, and sesame oils. Gravy. Sweets and desserts Custard. Pudding. Flavored gelatin. Smooth ice cream (without nuts or candy pieces). Sherbet. Popsicles. [place] ice. Pudding  pops. Seasoning and other foods Salt and pepper. Spices. Cocoa powder. Vinegar. Ketchup. Yellow mustard. Smooth sauces, such as [place], cheese sauce, or white sauce. Soy sauce. Cream soups. Strained soups. Syrup. Honey. Jelly (without fruit pieces). What foods are not allowed? The items listed may not be a complete list. Talk with your dietitian about what dietary choices are best for you. Grains Whole grains. Pasta. Rice. Cold cereal. Bread. Crackers. Vegetables All whole fresh, frozen, or canned vegetables. Fruits All whole fresh, frozen, or canned fruits. Meats and other protein foods All cuts of meat, poultry, and fish. Eggs. Tofu and soy protein. Nuts and nut butters. Lunch meat. Sausage. Dairy Hard cheese. Yogurt with fruit chunks. Fats and oils Coconut oil. Palm oil. Lard. Cold butter. Sweets and desserts Ice cream or other frozen desserts that have any solids in them or on top, such as nuts, chocolate chips, and pieces of cookies. Cakes. Cookies. Candy. Seasoning and other foods Stone-ground mustards. Soups with chunks or pieces. Summary  A full liquid diet refers to fluids and foods that are liquid or will become liquid at room temperature.  This diet should only be used for a short period of time to help you recover from illness or surgery. Ask your health care provider or dietitian when it is safe for you to eat regular foods.  To make sure you get enough calories and nutrients, eat 3 meals each day with snacks between. Drink premade nutrition supplement shakes or add protein powder to homemade shakes. Take a vitamin and mineral supplement as told by your health care provider. This information is not intended to replace advice given to you by your health care provider. Make sure you discuss any questions you have with your health care provider. Document Revised: 04/05/2017 Document Reviewed: 02/21/2016 Elsevier Patient Education  2020 [place] Diet A  bland diet consists of foods that are often soft and do not have a lot of fat, fiber, or extra seasonings. Foods without fat, fiber, or seasoning are easier for the body to digest. They are also less likely to irritate your mouth, throat, stomach, and other parts of your digestive system. A bland diet is sometimes called a BRAT diet. What is my plan? Your health care provider or food and nutrition specialist (dietitian) may recommend specific changes to your diet to prevent symptoms or to treat your symptoms. These changes may include:  Eating small meals often.  Cooking food until it is soft enough to chew easily.  Chewing your food well.  Drinking fluids slowly.  Not eating foods that are  very spicy, sour, or fatty.  Not eating citrus fruits, such as oranges and grapefruit. What do I need to know about this diet?  Eat a variety of foods from the bland diet food list.  Do not follow a bland diet longer than needed.  Ask your health care provider whether you should take vitamins or supplements. What foods can I eat? Grains  Hot cereals, such as cream of wheat. Rice. Bread, crackers, or tortillas made from refined white flour. Vegetables Canned or cooked vegetables. Mashed or boiled potatoes. Fruits  Bananas. Applesauce. Other types of cooked or canned fruit with the skin and seeds removed, such as canned peaches or pears. Meats and other proteins  Scrambled eggs. Creamy peanut butter or other nut butters. Lean, well-cooked meats, such as chicken or fish. Tofu. Soups or broths. Dairy Low-fat dairy products, such as milk, cottage cheese, or yogurt. Beverages  Water. Herbal tea. Apple juice. Fats and oils Mild salad dressings. Canola or olive oil. Sweets and desserts Pudding. Custard. Fruit gelatin. Ice cream. The items listed above may not be a complete list of recommended foods and beverages. Contact a dietitian for more options. What foods are not  recommended? Grains Whole grain breads and cereals. Vegetables Raw vegetables. Fruits Raw fruits, especially citrus, berries, or dried fruits. Dairy Whole fat dairy foods. Beverages Caffeinated drinks. Alcohol. Seasonings and condiments Strongly flavored seasonings or condiments. Hot sauce. Salsa. Other foods Spicy foods. Fried foods. Sour foods, such as pickled or fermented foods. Foods with high sugar content. Foods high in fiber. The items listed above may not be a complete list of foods and beverages to avoid. Contact a dietitian for more information. Summary  A bland diet consists of foods that are often soft and do not have a lot of fat, fiber, or extra seasonings.  Foods without fat, fiber, or seasoning are easier for the body to digest.  Check with your health care provider to see how long you should follow this diet plan. It is not meant to be followed for long periods. This information is not intended to replace advice given to you by your health care provider. Make sure you discuss any questions you have with your health care provider. Document Revised: 02/05/2017 Document Reviewed: 02/05/2017 Elsevier Patient Education  2020 [place] This Encounter  Procedures  . Stool culture  . CT Abdomen Pelvis W Contrast  . CT Chest Wo Contrast  . CBC with Differential/Platelet  . COMPLETE METABOLIC PANEL WITH GFR  . Lipase  . Amylase  . C. difficile GDH and Toxin A/B    Meds ordered this encounter  Medications  . ciprofloxacin (CIPRO) 500 MG tablet    Sig: Take 1 tablet (500 mg total) by mouth 2 (two) times daily.    Dispense:  14 tablet    Refill:  0  . metroNIDAZOLE (FLAGYL) 500 MG tablet    Sig: Take 1 tablet (500 mg total) by mouth 3 (three) times daily.    Dispense:  21 tablet    Refill:  0       Follow-up instructions: Return for RECHECK PENDING RESULTS / IF WORSE OR  CHANGE.                                         BP 127/76 (BP Location: Right Arm, Patient Position: Sitting)   Pulse  77   Wt 154 lb (69.9 kg)   SpO2 95%   BMI 30.08 kg/m   Current Meds  Medication Sig  . acetaminophen (TYLENOL) 325 MG tablet Take 2 tablets (650 mg total) by mouth every 6 (six) hours as needed for mild pain.  Marland Kitchen aspirin EC 81 MG tablet Take 81 mg by mouth daily. Swallow whole.  . Cyanocobalamin (VITAMIN B-12) 2500 MCG SUBL Place 2,500 mcg under the tongue daily.  . cyclobenzaprine (FLEXERIL) 10 MG tablet TAKE 1 TABLET(10 MG) BY MOUTH AT BEDTIME  . diclofenac Sodium (VOLTAREN) 1 % GEL Apply 4 g topically 4 (four) times daily. To affected joint.  . Dulaglutide (TRULICITY) 1.5 GY/6.5LD SOPN Inject 1.5 mg into the skin every Sunday.  . DULoxetine (CYMBALTA) 60 MG capsule Take 1 capsule (60 mg total) by mouth daily.  Marland Kitchen esomeprazole (NEXIUM) 20 MG capsule Take 40 mg by mouth daily at 12 noon.   Marland Kitchen estradiol (ESTRACE) 1 MG tablet Take 1 mg by mouth at bedtime.   . Famotidine 20 MG CHEW Chew 20 mg by mouth daily as needed (reflux).   . gabapentin (NEURONTIN) 800 MG tablet 1 tab in the morning, 1 tab midday, 2 tabs at bedtime (Patient taking differently: Take 800 mg by mouth See admin instructions. 1 tab in the morning, 1 tab midday, 2 tabs at bedtime)  . Insulin Glargine (BASAGLAR KWIKPEN) 100 UNIT/ML Inject 0.2 mLs (20 Units total) into the skin daily.  . insulin lispro (HUMALOG KWIKPEN) 200 UNIT/ML KwikPen Inject 5 units as directed prior to lunch and dinner (Patient taking differently: Inject 10 Units into the skin See admin instructions. Inject 10 units as directed prior to lunch and dinner)  . Insulin Pen Needle (PEN NEEDLES) 31G X 8 MM MISC DM Dx E11.9 Inject into skin up to 3 times daily.  . INVOKANA 300 MG TABS tablet Take 1 tablet (300 mg total) by mouth daily.  Marland Kitchen lisinopril (ZESTRIL) 2.5 MG tablet Take 1 tablet (2.5 mg total) by mouth  daily.  Marland Kitchen loteprednol (LOTEMAX) 0.5 % ophthalmic suspension Place 1 drop into the right eye daily as needed (dry eye).   . magnesium oxide (MAG-OX) 400 MG tablet Take 800 mg by mouth at bedtime.   . metoprolol tartrate (LOPRESSOR) 25 MG tablet Take 1 tablet (25 mg total) by mouth 2 (two) times daily.  . mirtazapine (REMERON) 45 MG tablet Take 1 tablet (45 mg total) by mouth at bedtime.  . [DISCONTINUED] atorvastatin (LIPITOR) 40 MG tablet Take 1 tablet (40 mg total) by mouth daily. (Patient taking differently: Take 40 mg by mouth at bedtime. )    No results found for this or any previous visit (from the past 72 hour(s)).  No results found.     All questions at time of visit were answered - patient instructed to contact office with any additional concerns or updates.  ER/RTC precautions were reviewed with the patient as applicable.   Please note: voice recognition software was used to produce this document, and typos may escape review. Please contact Dr. Sheppard Coil for any needed clarifications.

## 2019-09-15 NOTE — Patient Instructions (Signed)
Plan:  Labs and CT scan today! Symptoms seem more like infection: gastroenteritis (viral food poisoning) or possibly colitis (bacterial colon infection or diverticulitis). Maybe pancreatitis (inflammation of the pancreas).   If colitis - please fill printed Rx for antibiotics plus dietary plan as below, plenty of hydration!   If other (gastroenteritis or pancreatitis) - dietary plan as below (clear liquids for fe days, then advance to full liquid for a few days, then advance to bland diet as tolerated), plenty of hydration!   If severe pain, especially if fever or weakness, or other concerns, seek emergency medical attention!      Clear Liquid Diet, Adult A clear liquid diet is a diet that includes only liquids and semi-liquids that you can see through. You do not eat any food on this diet. Most people need to follow this diet for only a short period of time. You may need to follow a clear liquid diet if:  You develop a medical condition right before or after you have surgery.  You were not able to eat food for a long period of time.  You had a condition that gave you nausea, vomiting, or diarrhea.  You are going to have a procedure or exam to look at parts of your digestive system.  You are going to have bowel surgery. The usual goals of this diet are:  To rest the stomach and digestive system as much as possible.  To help you clear the digestive system before a procedure or exam.  To keep you hydrated.  To make sure you get some calories for energy.  To help you return to your normal eating routine. What are tips for following this plan?  A clear liquid is a liquid or semi-liquid, such as gelatin, that you can see through when you hold it up to a light.  A clear liquid diet does not provide all the nutrients that you need. It is important to choose a variety of the liquids or semi-liquids that are allowed on this diet. That way, you will get as many nutrients as  possible.  If you are not sure whether you can have certain items, ask your health care provider. If you have difficulty swallowing thin liquids, you will need to thicken them to prevent breathing in (aspiration). What foods should I eat?   Water and flavored water.  Fruit juices that do not have pulp, such as cranberry juice and apple juice.  Tea and coffee without milk or cream.  Clear bouillon or broth.  Broth-based soups that have been strained.  Flavored gelatin.  Honey.  Sugar water.  Ice or frozen ice pops that do not contain milk, yogurt, fruit pieces, or fruit pulp.  Clear sodas.  Clear sports drinks. The items listed above may not be a complete list of recommended foods and beverages. Contact a dietitian for more information. What food should I avoid?  Juices that have pulp.  Milk.  Cream or cream-based soups.  Yogurt.  Regular foods that are not clear liquids or semi-liquids. The items listed above may not be a complete list of foods and beverages to avoid. Contact a dietitian for more information. Questions to ask your health care provider:  How long do I need to follow this diet?  Are there any medicines that I should change while on this diet? Summary  A clear liquid diet is a diet that includes liquids and semi-liquids that you can see through.  The goal of this diet is  to help you recover by resting your digestive system, keeping you hydrated, and providing nutrients.  Avoid liquids with milk, cream, or pulp while on this diet. This information is not intended to replace advice given to you by your health care provider. Make sure you discuss any questions you have with your health care provider. Document Revised: 06/30/2017 Document Reviewed: 06/30/2017 Elsevier Patient Education  [place].   Full Liquid Diet A full liquid diet refers to fluids and foods that are liquid or will become liquid at room temperature. This diet should  only be used for a short period of time to help you recover from illness or surgery. Your health care provider or dietitian will help you determine when it is safe to eat regular foods. What are tips for following this plan?     Reading food labels  Check food labels of nutrition shakes for the amount of protein. Look for nutrition shakes that have at least 8-10 grams of protein in each serving.  Look for drinks, such as milks and juices, that are "fortified" or "enriched." This means that vitamins and minerals have been added. Shopping  Buy premade nutritional shakes to keep on hand.  To vary your choices, buy different flavors of milks and shakes. Meal planning  Choose flavors and foods that you enjoy.  To make sure you get enough energy from food (calories): ? Eat 3 full liquid meals each day. Have a liquid snack between each meal. ? Drink 6-8 ounces (177-237 ml) of a nutrition supplement shake with meals or as snacks. ? Add protein powder, powdered milk, milk, or yogurt to shakes to increase the amount of protein.  Drink at least one serving a day of citrus fruit juice or fruit juice that has vitamin C added. General guidelines  Before starting the full-liquid diet, check with your health care provider to know what foods you should avoid. These may include full-fat or high-fiber liquids.  You may have any liquid or food that becomes a liquid at room temperature. The food is considered a liquid if it can be poured off a spoon at room temperature.  Do not drink alcohol unless approved by your health care provider.  This diet gives you most of the nutrients that you need for energy, but you may not get enough of certain vitamins, minerals, and fiber. Make sure to talk to your health care provider or dietitian about: ? How many calories you need to eat get day. ? How much fluid you should have each day. ? Taking a multivitamin or a nutritional supplement. What foods are  allowed? The items listed may not be a complete list. Talk with your dietitian about what dietary choices are best for you. Grains Thin hot cereal, such as cream of wheat. Soft-cooked pasta or rice pured in soup. Vegetables Pulp-free tomato or vegetable juice. Vegetables pured in soup. Fruits Fruit juice without pulp. Strained fruit pures (seeds and skins removed). Meats and other protein foods Beef, chicken, and fish broths. Powdered protein supplements. Dairy Milk and milk-based beverages, including milk shakes and instant breakfast mixes. Smooth yogurt. Pured cottage cheese. Beverages Water. Coffee and tea (caffeinated or decaffeinated). Cocoa. Liquid nutritional supplements. Soft drinks. Nondairy milks, such as almond, coconut, rice, or soy milk. Fats and oils Melted margarine and butter. Cream. Canola, almond, avocado, corn, grapeseed, sunflower, and sesame oils. Gravy. Sweets and desserts Custard. Pudding. Flavored gelatin. Smooth ice cream (without nuts or candy pieces). Sherbet. Popsicles. [place] ice. Pudding  pops. Seasoning and other foods Salt and pepper. Spices. Cocoa powder. Vinegar. Ketchup. Yellow mustard. Smooth sauces, such as [place], cheese sauce, or white sauce. Soy sauce. Cream soups. Strained soups. Syrup. Honey. Jelly (without fruit pieces). What foods are not allowed? The items listed may not be a complete list. Talk with your dietitian about what dietary choices are best for you. Grains Whole grains. Pasta. Rice. Cold cereal. Bread. Crackers. Vegetables All whole fresh, frozen, or canned vegetables. Fruits All whole fresh, frozen, or canned fruits. Meats and other protein foods All cuts of meat, poultry, and fish. Eggs. Tofu and soy protein. Nuts and nut butters. Lunch meat. Sausage. Dairy Hard cheese. Yogurt with fruit chunks. Fats and oils Coconut oil. Palm oil. Lard. Cold butter. Sweets and desserts Ice cream or other frozen desserts that have  any solids in them or on top, such as nuts, chocolate chips, and pieces of cookies. Cakes. Cookies. Candy. Seasoning and other foods Stone-ground mustards. Soups with chunks or pieces. Summary  A full liquid diet refers to fluids and foods that are liquid or will become liquid at room temperature.  This diet should only be used for a short period of time to help you recover from illness or surgery. Ask your health care provider or dietitian when it is safe for you to eat regular foods.  To make sure you get enough calories and nutrients, eat 3 meals each day with snacks between. Drink premade nutrition supplement shakes or add protein powder to homemade shakes. Take a vitamin and mineral supplement as told by your health care provider. This information is not intended to replace advice given to you by your health care provider. Make sure you discuss any questions you have with your health care provider. Document Revised: 04/05/2017 Document Reviewed: 02/21/2016 Elsevier Patient Education  2020 [place] Diet A bland diet consists of foods that are often soft and do not have a lot of fat, fiber, or extra seasonings. Foods without fat, fiber, or seasoning are easier for the body to digest. They are also less likely to irritate your mouth, throat, stomach, and other parts of your digestive system. A bland diet is sometimes called a BRAT diet. What is my plan? Your health care provider or food and nutrition specialist (dietitian) may recommend specific changes to your diet to prevent symptoms or to treat your symptoms. These changes may include:  Eating small meals often.  Cooking food until it is soft enough to chew easily.  Chewing your food well.  Drinking fluids slowly.  Not eating foods that are very spicy, sour, or fatty.  Not eating citrus fruits, such as oranges and grapefruit. What do I need to know about this diet?  Eat a variety of foods from the bland diet food  list.  Do not follow a bland diet longer than needed.  Ask your health care provider whether you should take vitamins or supplements. What foods can I eat? Grains  Hot cereals, such as cream of wheat. Rice. Bread, crackers, or tortillas made from refined white flour. Vegetables Canned or cooked vegetables. Mashed or boiled potatoes. Fruits  Bananas. Applesauce. Other types of cooked or canned fruit with the skin and seeds removed, such as canned peaches or pears. Meats and other proteins  Scrambled eggs. Creamy peanut butter or other nut butters. Lean, well-cooked meats, such as chicken or fish. Tofu. Soups or broths. Dairy Low-fat dairy products, such as milk, cottage cheese, or yogurt. Beverages  Water. Herbal tea. Apple juice. Fats and oils Mild salad dressings. [place]ola or olive oil. Sweets and desserts Pudding. Custard. Fruit gelatin. Ice cream. The items listed above may not be a complete list of recommended foods and beverages. Contact a dietitian for more options. What foods are not recommended? Grains Whole grain breads and cereals. Vegetables Raw vegetables. Fruits Raw fruits, especially citrus, berries, or dried fruits. Dairy Whole fat dairy foods. Beverages Caffeinated drinks. Alcohol. Seasonings and condiments Strongly flavored seasonings or condiments. Hot sauce. Salsa. Other foods Spicy foods. Fried foods. Sour foods, such as pickled or fermented foods. Foods with high sugar content. Foods high in fiber. The items listed above may not be a complete list of foods and beverages to avoid. Contact a dietitian for more information. Summary  A bland diet consists of foods that are often soft and do not have a lot of fat, fiber, or extra seasonings.  Foods without fat, fiber, or seasoning are easier for the body to digest.  Check with your health care provider to see how long you should follow this diet plan. It is not meant to be followed for long  periods. This information is not intended to replace advice given to you by your health care provider. Make sure you discuss any questions you have with your health care provider. Document Revised: 02/05/2017 Document Reviewed: 02/05/2017 Elsevier Patient Education  2020 [employer].

## 2019-09-16 ENCOUNTER — Ambulatory Visit (INDEPENDENT_AMBULATORY_CARE_PROVIDER_SITE_OTHER): Payer: BC Managed Care – PPO

## 2019-09-16 DIAGNOSIS — R0789 Other chest pain: Secondary | ICD-10-CM

## 2019-09-16 DIAGNOSIS — R935 Abnormal findings on diagnostic imaging of other abdominal regions, including retroperitoneum: Secondary | ICD-10-CM | POA: Diagnosis not present

## 2019-09-16 DIAGNOSIS — Z951 Presence of aortocoronary bypass graft: Secondary | ICD-10-CM

## 2019-09-16 DIAGNOSIS — R1012 Left upper quadrant pain: Secondary | ICD-10-CM

## 2019-09-16 DIAGNOSIS — R0781 Pleurodynia: Secondary | ICD-10-CM | POA: Diagnosis not present

## 2019-09-16 DIAGNOSIS — R1031 Right lower quadrant pain: Secondary | ICD-10-CM | POA: Diagnosis not present

## 2019-09-16 DIAGNOSIS — R0602 Shortness of breath: Secondary | ICD-10-CM | POA: Diagnosis not present

## 2019-09-16 DIAGNOSIS — R918 Other nonspecific abnormal finding of lung field: Secondary | ICD-10-CM | POA: Diagnosis not present

## 2019-09-16 LAB — CBC WITH DIFFERENTIAL/PLATELET
Absolute Monocytes: 642 cells/uL (ref 200–950)
Basophils Absolute: 48 cells/uL (ref 0–200)
Basophils Relative: 0.7 %
Eosinophils Absolute: 614 cells/uL — ABNORMAL HIGH (ref 15–500)
Eosinophils Relative: 8.9 %
HCT: 39.9 % (ref 35.0–45.0)
Hemoglobin: 12.7 g/dL (ref 11.7–15.5)
Lymphs Abs: 2167 cells/uL (ref 850–3900)
MCH: 26.7 pg — ABNORMAL LOW (ref 27.0–33.0)
MCHC: 31.8 g/dL — ABNORMAL LOW (ref 32.0–36.0)
MCV: 84 fL (ref 80.0–100.0)
MPV: 10.8 fL (ref 7.5–12.5)
Monocytes Relative: 9.3 %
Neutro Abs: 3429 cells/uL (ref 1500–7800)
Neutrophils Relative %: 49.7 %
Platelets: 303 10*3/uL (ref 140–400)
RBC: 4.75 10*6/uL (ref 3.80–5.10)
RDW: 14.7 % (ref 11.0–15.0)
Total Lymphocyte: 31.4 %
WBC: 6.9 10*3/uL (ref 3.8–10.8)

## 2019-09-16 LAB — COMPLETE METABOLIC PANEL WITH GFR
AG Ratio: 1.7 (calc) (ref 1.0–2.5)
ALT: 11 U/L (ref 6–29)
AST: 14 U/L (ref 10–35)
Albumin: 4 g/dL (ref 3.6–5.1)
Alkaline phosphatase (APISO): 111 U/L (ref 37–153)
BUN: 15 mg/dL (ref 7–25)
CO2: 30 mmol/L (ref 20–32)
Calcium: 8.9 mg/dL (ref 8.6–10.4)
Chloride: 101 mmol/L (ref 98–110)
Creat: 0.52 mg/dL (ref 0.50–0.99)
GFR, Est African American: 120 mL/min/{1.73_m2} (ref >=60)
GFR, Est Non African American: 103 mL/min/{1.73_m2} (ref >=60)
Globulin: 2.4 g/dL (calc) (ref 1.9–3.7)
Glucose, Bld: 173 mg/dL — ABNORMAL HIGH (ref 65–139)
Potassium: 4.1 mmol/L (ref 3.5–5.3)
Sodium: 138 mmol/L (ref 135–146)
Total Bilirubin: 0.5 mg/dL (ref 0.2–1.2)
Total Protein: 6.4 g/dL (ref 6.1–8.1)

## 2019-09-16 LAB — LIPASE: Lipase: 33 U/L (ref 7–60)

## 2019-09-16 LAB — AMYLASE: Amylase: 33 U/L (ref 21–101)

## 2019-09-16 MED ORDER — IOHEXOL 300 MG/ML  SOLN
100.0000 mL | Freq: Once | INTRAMUSCULAR | Status: AC | PRN
Start: 2019-09-16 — End: 2019-09-16
  Administered 2019-09-16: 100 mL via INTRAVENOUS

## 2019-09-17 ENCOUNTER — Other Ambulatory Visit: Payer: Self-pay | Admitting: Osteopathic Medicine

## 2019-09-21 ENCOUNTER — Telehealth: Payer: Self-pay

## 2019-09-21 ENCOUNTER — Encounter: Payer: Self-pay | Admitting: Osteopathic Medicine

## 2019-09-21 DIAGNOSIS — R197 Diarrhea, unspecified: Secondary | ICD-10-CM | POA: Diagnosis not present

## 2019-09-21 MED ORDER — DIPHENOXYLATE-ATROPINE 2.5-0.025 MG PO TABS
ORAL_TABLET | ORAL | 1 refills | Status: DC
Start: 2019-09-21 — End: 2020-06-15

## 2019-09-21 NOTE — Telephone Encounter (Signed)
  MyChart message sent please call pt to make sure she saw it

## 2019-09-21 NOTE — Telephone Encounter (Signed)
Pt called stating she continues to have severe diarrhea. She is currently taking Imodium (6 tabs prn). However OTC med is not working in reducing diarrhea episodes and cramping. Requesting a stronger rx. Pls advise, thanks.

## 2019-09-23 LAB — SALMONELLA/SHIGELLA CULT, CAMPY EIA AND SHIGA TOXIN RFL ECOLI
MICRO NUMBER: 10895535
MICRO NUMBER:: 10895536
MICRO NUMBER:: 10895537
Result:: NOT DETECTED
SHIGA RESULT:: NOT DETECTED
SPECIMEN QUALITY: ADEQUATE
SPECIMEN QUALITY:: ADEQUATE
SPECIMEN QUALITY:: ADEQUATE

## 2019-09-23 LAB — C. DIFFICILE GDH AND TOXIN A/B
GDH ANTIGEN: NOT DETECTED
MICRO NUMBER:: 10895335
SPECIMEN QUALITY:: ADEQUATE
TOXIN A AND B: NOT DETECTED

## 2019-09-27 ENCOUNTER — Ambulatory Visit: Payer: BC Managed Care – PPO | Admitting: Osteopathic Medicine

## 2019-09-30 ENCOUNTER — Encounter: Payer: Self-pay | Admitting: Osteopathic Medicine

## 2019-09-30 ENCOUNTER — Ambulatory Visit (INDEPENDENT_AMBULATORY_CARE_PROVIDER_SITE_OTHER): Payer: BC Managed Care – PPO | Admitting: Osteopathic Medicine

## 2019-09-30 VITALS — BP 146/92 | HR 71 | Wt 157.0 lb

## 2019-09-30 DIAGNOSIS — M19072 Primary osteoarthritis, left ankle and foot: Secondary | ICD-10-CM

## 2019-09-30 DIAGNOSIS — I209 Angina pectoris, unspecified: Secondary | ICD-10-CM

## 2019-09-30 DIAGNOSIS — Z951 Presence of aortocoronary bypass graft: Secondary | ICD-10-CM

## 2019-09-30 DIAGNOSIS — M19042 Primary osteoarthritis, left hand: Secondary | ICD-10-CM

## 2019-09-30 DIAGNOSIS — IMO0002 Reserved for concepts with insufficient information to code with codable children: Secondary | ICD-10-CM

## 2019-09-30 DIAGNOSIS — E1165 Type 2 diabetes mellitus with hyperglycemia: Secondary | ICD-10-CM | POA: Diagnosis not present

## 2019-09-30 DIAGNOSIS — E1140 Type 2 diabetes mellitus with diabetic neuropathy, unspecified: Secondary | ICD-10-CM

## 2019-09-30 DIAGNOSIS — M19071 Primary osteoarthritis, right ankle and foot: Secondary | ICD-10-CM

## 2019-09-30 DIAGNOSIS — M19041 Primary osteoarthritis, right hand: Secondary | ICD-10-CM

## 2019-09-30 DIAGNOSIS — K591 Functional diarrhea: Secondary | ICD-10-CM

## 2019-09-30 DIAGNOSIS — G2581 Restless legs syndrome: Secondary | ICD-10-CM

## 2019-09-30 LAB — POCT GLYCOSYLATED HEMOGLOBIN (HGB A1C)
HbA1c POC (<> result, manual entry): 8.2 % (ref 4.0–5.6)
HbA1c, POC (controlled diabetic range): 8.2 % — AB (ref 0.0–7.0)
HbA1c, POC (prediabetic range): 8.2 % — AB (ref 5.7–6.4)
Hemoglobin A1C: 8.2 % — AB (ref 4.0–5.6)

## 2019-09-30 MED ORDER — ROPINIROLE HCL 1 MG PO TABS: ORAL_TABLET | ORAL | 0 refills | Status: DC

## 2019-09-30 MED ORDER — COLESEVELAM HCL 3.75 G PO PACK: 3.7500 g | PACK | Freq: Every day | ORAL | 1 refills | Status: DC

## 2019-09-30 MED ORDER — NITROGLYCERIN 0.4 MG SL SUBL
0.4000 mg | SUBLINGUAL_TABLET | SUBLINGUAL | 1 refills | Status: AC | PRN
Start: 2019-09-30 — End: ?

## 2019-09-30 MED ORDER — ESTRADIOL 1 MG PO TABS: 1.0000 mg | ORAL_TABLET | Freq: Every day | ORAL | 3 refills | Status: DC

## 2019-09-30 NOTE — Progress Notes (Signed)
HPI: Tonya Chandler is a 59 y.o. female who  has a past medical history of Abnormal ECG (04/01/2018), Abnormal stress echocardiogram (05/24/2019), Anemia, Anxiety, Atherosclerosis of native coronary artery with angina pectoris ([place]) (05/10/2015), Bilateral carpal tunnel syndrome (05/23/2017), Bilateral knee pain (02/16/2010), Carpal tunnel syndrome, right (12/16/2017), Colon polyps, Depression, DEPRESSION (02/16/2010), Depression, major, in partial remission ([place]) (07/14/2015), Diabetes mellitus, Diverticulosis, Dupuytren's contracture of right hand (06/24/2017), Essential hypertension (02/16/2010), Gastro-esophageal reflux, Gastrointestinal ulcer, Generalized anxiety disorder (03/14/2015), GERD (02/16/2010), H/O: GI bleed, Hyperlipidemia, Hyperlipidemia LDL goal <70 (02/16/2010), Hypertension, IBS (irritable bowel syndrome), Impingement syndrome of shoulder, right (06/24/2017), Midline low back pain with left-sided sciatica (12/29/2013), Musculoskeletal chest pain (05/04/2015), Obesity, Precordial pain (05/04/2015), Primary insomnia (03/14/2015), Radiculitis of right cervical region (01/20/2018), Radiculopathy (04/02/2018), Right hand pain (06/24/2017), RLS (restless legs syndrome) (07/14/2015), and Type 2 diabetes mellitus with diabetic neuropathy, with long-term current use of insulin ([place]) (02/16/2010).  she presents to [facility] today, 09/30/19,  for chief complaint of: Follow up DM, chest pain, restless leg syndrome, arthritis  Patient reports she has generally been doing okay. She often forgets to take her humalog with meals. She also struggles with sweets, stating they are a sort of comfort item before she goes to bed. She has been trying to eat sugar free versions, but still has some trouble with it. She looked into getting a nutritionist, but the one she contacted required a referral.  She has been working from home due to her recent issues with diarrhea, but she went back to work for the  first time on Tuesday. While there, she had an episode of significant left sided chest pain that radiated to her left shoulder. The pain lasted for several hours. She states she was mostly sitting working during this time, and the pain did not worsen with exertion, though she might have been anxious being back at work. She has not had any recurrences since that time. No nitro.   She also notes her restless leg syndrome has gotten worse in recent weeks. She localizes the pain primarily to the lateral sides of her lower legs and describes it like muscle cramps. She has had increasing pain, especially at night when she goes to bed and sometimes takes an hour or more to fall asleep due to the discomfort. She states the pain also occurs sometimes when she sits still for too long, but resolves with walking around.  She has a history of osteoarthritis in her right 4th and 5th fingers and in her feet. She was previously on celebrex, but this was stopped after her CABG earlier this year. She started taking aleve instead, but when she started having the diarrhea several weeks ago, she stopped the aleve due to concern this might be contributing. However, the osteoarthritis pain has worsened now and her diarrhea has continued.   She was given lomotil at herr last visit for the diarrhea, which has improved her symptoms, but has not completely resolved them. She is scheduled with GI on 10/19, but is frustrated about having to wait so long. Her last colonoscopy was in January 2021. Reports hx IBS but this seems prolonged for a usual flare for her.     Past medical, surgical, social and family history reviewed:  Patient Active Problem List   Diagnosis Date Noted  . S/P CABG x 3 LIMA to LAD, SVG sequential to RCA and circumflex in May 2021   . Unstable angina ([place]) 06/11/2019  . Angina pectoris ([place])  06/04/2019  . Abnormal stress test 05/24/2019  . Radiculopathy 04/02/2018  . Abnormal ECG 04/01/2018  .  Radiculitis of right cervical region 01/20/2018  . Carpal tunnel syndrome, right 12/16/2017  . Right hand pain 06/24/2017  . Impingement syndrome of shoulder, right 06/24/2017  . Dupuytren's contracture of right hand 06/24/2017  . Bilateral carpal tunnel syndrome 05/23/2017  . Depression, major, in partial remission ([place]) 07/14/2015  . RLS (restless legs syndrome) 07/14/2015  . Atherosclerosis of native coronary artery with angina pectoris ([place]) 05/10/2015  . Musculoskeletal chest pain 05/04/2015  . Precordial pain 05/04/2015  . Generalized anxiety disorder 03/14/2015  . Primary insomnia 03/14/2015  . Midline low back pain with left-sided sciatica 12/29/2013  . Type 2 diabetes mellitus with diabetic neuropathy, with long-term current use of insulin ([place]) 02/16/2010  . Hyperlipidemia LDL goal <70 02/16/2010  . DEPRESSION 02/16/2010  . Essential hypertension 02/16/2010  . GERD 02/16/2010  . Bilateral knee pain 02/16/2010    Past Surgical History:  Procedure Laterality Date  . ABDOMINAL HYSTERECTOMY    . ANTERIOR CERVICAL DECOMP/DISCECTOMY FUSION N/A 04/02/2018   Procedure: ANTERIOR CERVICAL DECOMPRESSION FUSION, CERVICAL FOUR-FIVE, CERVICAL FIVE-SIX, WITH INSTRUMENTATION AND ALLOGRAFT;  Surgeon: Phylliss Bob, MD;  Location: [facility];  Service: Orthopedics;  Laterality: N/A;  . COLONOSCOPY WITH ESOPHAGOGASTRODUODENOSCOPY (EGD)  05/04/2009  . CORONARY ARTERY BYPASS GRAFT N/A 06/14/2019   Procedure: CORONARY ARTERY BYPASS GRAFTING (CABG) using LIMA to LAD; Endoscopic Right Greater Saphenous Vein Harvest: SVG to Circ (distal); SVG to RCA (distal).;  Surgeon: [name], MD;  Location: [place] [facility];  Service: Open Heart Surgery;  Laterality: N/A;  . ENDOVEIN HARVEST OF GREATER SAPHENOUS VEIN Right 06/14/2019   Procedure: [place] Of Greater Saphenous Vein;  Surgeon: [name], MD;  Location: [place];  Service: Open Heart Surgery;  Laterality: Right;  . LEFT HEART CATH AND  CORONARY ANGIOGRAPHY N/A 06/11/2019   Procedure: LEFT HEART CATH AND CORONARY ANGIOGRAPHY;  Surgeon: Wellington Hampshire, MD;  Location: [place];  Service: Cardiovascular;  Laterality: N/A;  . PIP JOINT FUSION Bilateral    pinky  . TEE WITHOUT CARDIOVERSION N/A 06/14/2019   Procedure: TRANSESOPHAGEAL ECHOCARDIOGRAM (TEE);  Surgeon: [name], MD;  Location: [place] [facility];  Service: Open Heart Surgery;  Laterality: N/A;    Social History   Tobacco Use  . Smoking status: Never Smoker  . Smokeless tobacco: Never Used  Substance Use Topics  . Alcohol use: No    Family History  Problem Relation Age of Onset  . Diabetes Mother   . Dementia Mother   . High blood pressure Mother   . Heart attack Mother   . Colon cancer Mother   . Ovarian cancer Mother   . Heart attack Father   . Diabetes Father   . Heart attack Brother   . High blood pressure Brother   . Diabetes Brother   . Epilepsy Brother   . Colon polyps Daughter 7  . Esophageal cancer Neg Hx   . Rectal cancer Neg Hx   . Stomach cancer Neg Hx   . Heart disease Neg Hx      Current medication list and allergy/intolerance information reviewed:    Current Outpatient Medications  Medication Sig Dispense Refill  . acetaminophen (TYLENOL) 325 MG tablet Take 2 tablets (650 mg total) by mouth every 6 (six) hours as needed for mild pain.    Marland Kitchen aspirin EC 81 MG tablet Take 81 mg by mouth daily. Swallow  whole.    . atorvastatin (LIPITOR) 40 MG tablet TAKE 1 TABLET(40 MG) BY MOUTH DAILY 90 tablet 3  . Cyanocobalamin (VITAMIN B-12) 2500 MCG SUBL Place 2,500 mcg under the tongue daily.    . cyclobenzaprine (FLEXERIL) 10 MG tablet TAKE 1 TABLET(10 MG) BY MOUTH AT BEDTIME 30 tablet 3  . diphenoxylate-atropine (LOMOTIL) 2.5-0.025 MG tablet One to 2 tablets by mouth 4 times a day as needed for diarrhea. 30 tablet 1  . Dulaglutide (TRULICITY) 1.5 NL/8.9QJ SOPN Inject 1.5 mg into the skin every Sunday. 4 pen 11  . DULoxetine  (CYMBALTA) 60 MG capsule Take 1 capsule (60 mg total) by mouth daily. 90 capsule 3  . esomeprazole (NEXIUM) 20 MG capsule Take 40 mg by mouth daily at 12 noon.     Marland Kitchen estradiol (ESTRACE) 1 MG tablet Take 1 tablet (1 mg total) by mouth daily. 90 tablet 3  . Famotidine 20 MG CHEW Chew 20 mg by mouth daily as needed (reflux).     . gabapentin (NEURONTIN) 800 MG tablet 1 tab in the morning, 1 tab midday, 2 tabs at bedtime (Patient taking differently: Take 800 mg by mouth See admin instructions. 1 tab in the morning, 1 tab midday, 2 tabs at bedtime) 120 tablet 3  . Insulin Glargine (BASAGLAR KWIKPEN) 100 UNIT/ML Inject 0.2 mLs (20 Units total) into the skin daily. 15 mL 2  . insulin lispro (HUMALOG KWIKPEN) 200 UNIT/ML KwikPen Inject 5 units as directed prior to lunch and dinner (Patient taking differently: Inject 10 Units into the skin See admin instructions. Inject 10 units as directed prior to lunch and dinner) 2 pen 5  . Insulin Pen Needle (PEN NEEDLES) 31G X 8 MM MISC DM Dx E11.9 Inject into skin up to 3 times daily. 100 each prn  . INVOKANA 300 MG TABS tablet Take 1 tablet (300 mg total) by mouth daily. 90 tablet 1  . lisinopril (ZESTRIL) 2.5 MG tablet Take 1 tablet (2.5 mg total) by mouth daily. 90 tablet 1  . loteprednol (LOTEMAX) 0.5 % ophthalmic suspension Place 1 drop into the right eye daily as needed (dry eye).     . magnesium oxide (MAG-OX) 400 MG tablet Take 800 mg by mouth at bedtime.     . metoprolol tartrate (LOPRESSOR) 25 MG tablet Take 1 tablet (25 mg total) by mouth 2 (two) times daily. 180 tablet 3  . mirtazapine (REMERON) 45 MG tablet Take 1 tablet (45 mg total) by mouth at bedtime. 90 tablet 3  . Colesevelam HCl 3.75 g PACK Take 1 packet (3.75 g total) by mouth daily. 30 each 1  . diclofenac Sodium (VOLTAREN) 1 % GEL Apply 4 g topically 4 (four) times daily. To affected joint. 100 g 11  . nitroGLYCERIN (NITROSTAT) 0.4 MG SL tablet Place 1 tablet (0.4 mg total) under the tongue every  5 (five) minutes as needed for chest pain. 20 tablet 1  . rOPINIRole (REQUIP) 1 MG tablet Take 2 tablets (2 mg total) by mouth at bedtime for 7 days, THEN 2.5 tablets (2.5 mg total) at bedtime for 7 days, THEN 3 tablets (3 mg total) at bedtime. 90 tablet 0   No current facility-administered medications for this visit.    Allergies  Allergen Reactions  . Adhesive [Tape] Hives    Welts--PLEASE USE HYPO ALLERGIC TAPE  . Ampicillin Diarrhea    Did it involve swelling of the face/tongue/throat, SOB, or low BP? No Did it involve sudden or severe rash/hives, skin  peeling, or any reaction on the inside of your mouth or nose? No Did you need to seek medical attention at a hospital or doctor's office? No When did it last happen?30-40 years ago If all above answers are "NO", may proceed with cephalosporin use.   Marland Kitchen Cefprozil Other (See Comments)    Acid reflux/indigestion issues  . Meloxicam Other (See Comments)    Acid reflux/indigestion issues      Review of Systems:  Constitutional:  No  fever, no chills  HEENT: No  headache, no vision change  Cardiac: +  chest pain, see HPI  Respiratory:  No  shortness of breath. No  Cough  Gastrointestinal: No  abdominal pain, No  nausea, No  vomiting,  No  blood in stool, +  diarrhea, No  constipation   Musculoskeletal: + chronic R hand and feet pain, + bilateral calf pain  Skin: No  Rash, No other wounds/concerning lesions  Endocrine: No cold intolerance,  No heat intolerance. No polyuria/polydipsia/polyphagia   Neurologic: No  weakness, No  dizziness  Psychiatric: No  concerns with depression  Exam:  BP (!) 146/92 (BP Location: Right Arm, Patient Position: Sitting)   Pulse 71   Wt 157 lb (71.2 kg)   SpO2 96%   BMI 30.66 kg/m   Constitutional: VS see above. General Appearance: alert, well-developed, well-nourished, NAD  Eyes: Normal lids and conjunctive, non-icteric sclera   Neck: No masses, trachea midline.  Respiratory:  Normal respiratory effort. no wheeze, no rhonchi, no rales  Cardiovascular: S1/S2 normal, no murmur, no rub/gallop auscultated. RRR. No lower extremity edema. Pedal pulse II/IV bilaterally DP and PT.  Gastrointestinal: Nontender, no masses.   Musculoskeletal: Gait normal. No clubbing/cyanosis of digits.   Neurological: Normal balance/coordination. No tremor. No cranial nerve deficit on limited exam.  Skin: warm, dry, intact. No rash/ulcer. No concerning nevi or subq nodules on limited exam.   Psychiatric: Normal judgment/insight. Normal mood and affect. Oriented x3.    Results for orders placed or performed in visit on 09/30/19 (from the past 72 hour(s))  POCT HgB A1C     Status: Abnormal   Collection Time: 09/30/19 10:59 AM  Result Value Ref Range   Hemoglobin A1C 8.2 (A) 4.0 - 5.6 %   HbA1c POC (<> result, manual entry) 8.2 4.0 - 5.6 %   HbA1c, POC (prediabetic range) 8.2 (A) 5.7 - 6.4 %   HbA1c, POC (controlled diabetic range) 8.2 (A) 0.0 - 7.0 %    No results found.      ASSESSMENT/PLAN: The primary encounter diagnosis was Type 2 diabetes, uncontrolled, with neuropathy ([place]). Diagnoses of S/P CABG x 3 LIMA to LAD, SVG sequential to RCA and circumflex in May 2021, Angina pectoris ([place]), Primary osteoarthritis of both hands, Primary osteoarthritis of both feet, Restless legs syndrome, and Functional diarrhea were also pertinent to this visit.   Diabetes Mellitus, Type 2  HbA1c today 8.2, up from 7.6 in June.  Discussed methods to improve compliance with humalog and diet.  Patient would like to work with nutritionist. Referral sent.  Continue current regimen with basaglar and humalog.  Recheck A1c in 3 months. If no improvement in A1c, may need to adjust medications.  Chest pain, h/o CABG x3 (05/2019)  Single episode of non-exertional chest pain that may have been anxiety provoked and has not recurred. Low suspicion for cardiac etiology, though patient has history of  recent CABG.  Prescribed nitroglycerin as patient does not have a current prescription. Recommended she keep  this with her in case chest pain recurs.  If symptoms worsen or recur, follow up with cardiologist as soon as possible. Otherwise, follow up at regular appt with them in November.  Restless leg syndrome  Symptoms have worsened recently. Pt description not consistent with claudication.  Currently on low dose of requip (1mg ). Advised increasing this by 0.5mg  per week until symptoms resolve or 3mg  daily.  Osteoarthritis  Pt asked about resuming celebrex, however, her recent cardiac history means this is not advised.  Recommended restarting aleve BID, reduced cardiac risk. This is unlikely to be contributing to her diarrhea.  Persistent Diarrhea  Pt reported history of prior IBS, but this episode has lasted longer than previously.  Continue lomotil.  Add colesevelam 3.75mg  QD. Should also improve cholesterol.  Keep appointment scheduled with GI on 10/19.  Orders Placed This Encounter  Procedures  . Amb ref to Medical Nutrition Therapy-MNT  . POCT HgB A1C    Meds ordered this encounter  Medications  . rOPINIRole (REQUIP) 1 MG tablet    Sig: Take 2 tablets (2 mg total) by mouth at bedtime for 7 days, THEN 2.5 tablets (2.5 mg total) at bedtime for 7 days, THEN 3 tablets (3 mg total) at bedtime.    Dispense:  90 tablet    Refill:  0  . nitroGLYCERIN (NITROSTAT) 0.4 MG SL tablet    Sig: Place 1 tablet (0.4 mg total) under the tongue every 5 (five) minutes as needed for chest pain.    Dispense:  20 tablet    Refill:  1  . Colesevelam HCl 3.75 g PACK    Sig: Take 1 packet (3.75 g total) by mouth daily.    Dispense:  30 each    Refill:  1  . estradiol (ESTRACE) 1 MG tablet    Sig: Take 1 tablet (1 mg total) by mouth daily.    Dispense:  90 tablet    Refill:  3    Patient Instructions  Aleve ok for arthritis  Added Welchol for diarrhea - keep GI appt Added nitro as  needed for chest pain - will inform cardiology, keep appt  Increased Requip for RLS - use lowest effective dose  Insulin! Do it!           Visit summary with medication list and pertinent instructions was printed for patient to review. All questions at time of visit were answered - patient instructed to contact office with any additional concerns or updates. ER/RTC precautions were reviewed with the patient.     Please note: voice recognition software was used to produce this document, and typos may escape review. Please contact Dr. Sheppard Coil for any needed clarifications.   40 minutes spent   Follow-up plan: Return in about 3 months (around 12/30/2019) for recheck A1C, see me sooner if needed! Let me know how Rx are working / need refills .  Gweneth Dimitri, [facility] MS3

## 2019-09-30 NOTE — Patient Instructions (Signed)
Aleve ok for arthritis  Added Welchol for diarrhea - keep GI appt Added nitro as needed for chest pain - will inform cardiology, keep appt  Increased Requip for RLS - use lowest effective dose  Insulin! Do it!

## 2019-10-07 ENCOUNTER — Other Ambulatory Visit: Payer: Self-pay | Admitting: Osteopathic Medicine

## 2019-10-17 ENCOUNTER — Other Ambulatory Visit: Payer: Self-pay | Admitting: Osteopathic Medicine

## 2019-10-24 ENCOUNTER — Other Ambulatory Visit: Payer: Self-pay | Admitting: Osteopathic Medicine

## 2019-10-24 DIAGNOSIS — M5412 Radiculopathy, cervical region: Secondary | ICD-10-CM

## 2019-11-01 ENCOUNTER — Telehealth: Payer: Self-pay

## 2019-11-01 NOTE — Telephone Encounter (Signed)
Patient left voicemail wanting a copy of covid 19 shot record. Returned call to patient, patient says she went on Department of health and retrieved her card.

## 2019-11-02 ENCOUNTER — Other Ambulatory Visit: Payer: Self-pay | Admitting: Osteopathic Medicine

## 2019-11-02 DIAGNOSIS — G2581 Restless legs syndrome: Secondary | ICD-10-CM

## 2019-11-02 NOTE — Telephone Encounter (Signed)
Last refill- 09/30/2019 Last Ov-09/30/2019

## 2019-11-09 ENCOUNTER — Ambulatory Visit: Payer: BC Managed Care – PPO | Admitting: Gastroenterology

## 2019-11-16 ENCOUNTER — Other Ambulatory Visit: Payer: Self-pay | Admitting: Osteopathic Medicine

## 2019-11-18 ENCOUNTER — Other Ambulatory Visit: Payer: Self-pay

## 2019-11-18 ENCOUNTER — Encounter: Payer: BC Managed Care – PPO | Attending: Osteopathic Medicine | Admitting: Dietician

## 2019-11-18 ENCOUNTER — Encounter: Payer: Self-pay | Admitting: Dietician

## 2019-11-18 DIAGNOSIS — Z794 Long term (current) use of insulin: Secondary | ICD-10-CM | POA: Insufficient documentation

## 2019-11-18 DIAGNOSIS — E1140 Type 2 diabetes mellitus with diabetic neuropathy, unspecified: Secondary | ICD-10-CM | POA: Insufficient documentation

## 2019-11-18 NOTE — Progress Notes (Signed)
Medical Nutrition Therapy:  Appt start time: 7893    end time:  8101.   Assessment:  Primary concerns today:   Patient is here today alone.  She would like more information about a heart healthy diabetic diet.  History includes Type 2 diabetes, recent colitis flair (now improved), CABG x3 05/2019, HTN, HLD, depression, GERD A1C 8.2% 09/30/2019, Cholesterol 15, HDL 49, LDL 61, Triglycerides 255, eGFR 103 Fasting blood glucose 328 recently and usual in the high 100's Medications include:  Basaglar 50 units q HS, Humalog 10 units bid (before meals and usually eats only 2 meals daily).  She frequently forgets Humalog.  Invokana, Trulicity  Weight hx: 751 lbs 11/18/2019.  This is her usual weight currently. Highest weight 180 lbs Lowest adult weight 97 lbs prior to pregnancy.  Patient lives with her husband and adult daughter recently moved to [place].  They shop on line and she does the cooking. 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   Her husband was diagnosed with bipolar and OCD about 10 years ago when their teen daughter was found to use Heroin. Increased stress overall. Increased intake of snacks at night (emotional eating). Increased intake of sugar free beverages.  Preferred Learning Style:   No preference indicated   Learning Readiness:   Ready   DIETARY INTAKE:  Avoided foods include spinach (raw and cooked), limits raw vegetables due to GI symptoms.   Her husband prefers to have fewer carbohydrates. She craves sugar. This is worse with stress.  24-hr recall:  B ( AM): skips usually or rare grits, bacon, biscuit Snk ( AM): occasional yogurt and fruit L ( PM): hot dog or ham sandwich OR pasta carbonara , salad   Snk ( PM): none D ( PM): meat, starch, vegetable Snk ( PM): sugar free popsicle or Halo ice cream frequently  Beverages: sugar free coffee, coke zero (2 bottles), sugar free lemonade, water  Usual physical activity: She  did not go to Cardiac Rehab.  Walks her dog (beagle) daily for 20 minutes daily.   Progress Towards Goal(s):  In progress.   Nutritional Diagnosis:  NB-1.1 Food and nutrition-related knowledge deficit As related to balance of carbohydrate, protein, and fat.  As evidenced by diet hx and patient report.    Intervention:  Nutrition education/counseling related to mindfulness with meals, stress control, importance of exercise and consistency of insulin.  Discussed increased vegetables as tolerated. Discussed CGM options as more information may help in food choices as well.  Plan: Consider getting your vitamin D checked  Consider Vitamin D-3 1000-2000 units daily. Consider alternative to snacking at night.  Small portion eaten away from distraction.  Avoid buying so tempting items are is not in the house.  Consider food free zones.  Finding pleasure- games on your phone, puzzle, a new hobby, walk, etc. Eat slowly, stop when satisfied. Consider avoiding food with Inulin as an ingredient   Consider an Al-Anon meeting Quality sleep. Aim for 30 minutes most days. What can you do to remember to take your Humalog prior to meals.  Plan:  Aim for 2 Carb Choices per meal (30 grams) +/- 1 either way  Aim for 0-1 Carbs per snack if hungry  Include protein in moderation with your meals and snacks Consider reading food labels for Total Carbohydrate of foods Consider checking BG at alternate times per day   Consider the [facility] or Dexcom Continue taking medication as directed  by MD  How can you remember to take the Humalog before meals.  Teaching Method Utilized:  Visual Auditory  Handouts given during visit include: How to Thrive:  A Guide for Your Journey with Diabetes by the ADA Meal Plan Card My Plate Snack list Resource list Dining out with Diabetes  Barriers to learning/adherence to lifestyle change: stress, time  Demonstrated degree of understanding via:  Teach Back    Monitoring/Evaluation:  Dietary intake, exercise, and body weight in 1 month(s).

## 2019-11-18 NOTE — Patient Instructions (Addendum)
Consider getting your vitamin D checked  Consider Vitamin D-3 1000-2000 units daily. Consider alternative to snacking at night.  Small portion eaten away from distraction.  Avoid buying so tempting items are is not in the house.  Consider food free zones.  Finding pleasure- games on your phone, puzzle, a new hobby, walk, etc. Eat slowly, stop when satisfied. Consider avoiding food with Inulin as an ingredient   Consider an Al-Anon meeting Quality sleep. Aim for 30 minutes most days. What can you do to remember to take your Humalog prior to meals.  Plan:  Aim for 2 Carb Choices per meal (30 grams) +/- 1 either way  Aim for 0-1 Carbs per snack if hungry  Include protein in moderation with your meals and snacks Consider reading food labels for Total Carbohydrate of foods Consider checking BG at alternate times per day   Consider the [facility] or Dexcom Continue taking medication as directed by MD  How can you remember to take the Humalog before meals.

## 2019-11-30 ENCOUNTER — Other Ambulatory Visit: Payer: Self-pay

## 2019-11-30 ENCOUNTER — Encounter: Payer: Self-pay | Admitting: Cardiology

## 2019-11-30 ENCOUNTER — Ambulatory Visit (INDEPENDENT_AMBULATORY_CARE_PROVIDER_SITE_OTHER): Payer: BC Managed Care – PPO | Admitting: Cardiology

## 2019-11-30 VITALS — BP 120/74 | HR 88 | Ht 60.0 in | Wt 158.0 lb

## 2019-11-30 DIAGNOSIS — I10 Essential (primary) hypertension: Secondary | ICD-10-CM | POA: Diagnosis not present

## 2019-11-30 DIAGNOSIS — Z951 Presence of aortocoronary bypass graft: Secondary | ICD-10-CM

## 2019-11-30 DIAGNOSIS — E1140 Type 2 diabetes mellitus with diabetic neuropathy, unspecified: Secondary | ICD-10-CM | POA: Diagnosis not present

## 2019-11-30 DIAGNOSIS — Z23 Encounter for immunization: Secondary | ICD-10-CM | POA: Diagnosis not present

## 2019-11-30 DIAGNOSIS — E785 Hyperlipidemia, unspecified: Secondary | ICD-10-CM | POA: Diagnosis not present

## 2019-11-30 DIAGNOSIS — Z794 Long term (current) use of insulin: Secondary | ICD-10-CM

## 2019-11-30 NOTE — Progress Notes (Signed)
Cardiology Office Note:    Date:  11/30/2019   ID:  Tonya Chandler, DOB 1961-01-01, MRN [id number]  PCP:  Emeterio Reeve, DO  Cardiologist:  Jenne Campus, MD    Referring MD: Emeterio Reeve, DO   Chief Complaint  Patient presents with  . Follow-up  Am doing very well  History of Present Illness:    Tonya Chandler is a 59 y.o. female with past medical history significant for coronary artery disease, status post coronary artery bypass graft done in May of this year with LIMA to LAD SVG to RCA and circumflex.  She is diabetic does have dyslipidemia as well as hypertension.  Comes today 2 months for follow-up.  Overall she is doing great she is asymptomatic, denies have any chest pain tightness squeezing pressure burning chest she said she got much more energy she does not get short of breath she can climb stairs no difficulties she started to be more active.  Past Medical History:  Diagnosis Date  . Abnormal ECG 04/01/2018  . Abnormal stress echocardiogram 05/24/2019   Formatting of this note might be different from the original. Added automatically from request for surgery [id number]  . Anemia   . Anxiety   . Atherosclerosis of native coronary artery with angina pectoris ([place]) 05/10/2015   Formatting of this note might be different from the original. 30% lesion-[place]  . Bilateral carpal tunnel syndrome 05/23/2017  . Bilateral knee pain 02/16/2010   Qualifier: Diagnosis of  By: Koleen Nimrod MD, Dellis Filbert    . Carpal tunnel syndrome, right 12/16/2017  . Colon polyps   . Depression   . DEPRESSION 02/16/2010   Qualifier: Diagnosis of  By: Koleen Nimrod MD, Dellis Filbert    . Depression, major, in partial remission ([place]) 07/14/2015  . Diabetes mellitus   . Diverticulosis   . Dupuytren's contracture of right hand 06/24/2017  . Essential hypertension 02/16/2010   Qualifier: Diagnosis of  By: Koleen Nimrod MD, Dellis Filbert    . Gastro-esophageal reflux   . Gastrointestinal ulcer   . Generalized anxiety disorder 03/14/2015   . GERD 02/16/2010   Qualifier: Diagnosis of  By: Koleen Nimrod MD, Dellis Filbert    . H/O: GI bleed    Michela Pitcher it was due to taking metformin  . Hyperlipidemia   . Hyperlipidemia LDL goal <70 02/16/2010   Qualifier: Diagnosis of  By: Koleen Nimrod MD, Dellis Filbert    . Hypertension   . IBS (irritable bowel syndrome)   . Impingement syndrome of shoulder, right 06/24/2017  . Midline low back pain with left-sided sciatica 12/29/2013  . Musculoskeletal chest pain 05/04/2015  . Obesity   . Precordial pain 05/04/2015  . Primary insomnia 03/14/2015  . Radiculitis of right cervical region 01/20/2018  . Radiculopathy 04/02/2018  . Right hand pain 06/24/2017  . RLS (restless legs syndrome) 07/14/2015  . Type 2 diabetes mellitus with diabetic neuropathy, with long-term current use of insulin ([place]) 02/16/2010   Qualifier: Diagnosis of  By: Koleen Nimrod MD, Dellis Filbert      Past Surgical History:  Procedure Laterality Date  . ABDOMINAL HYSTERECTOMY    . ANTERIOR CERVICAL DECOMP/DISCECTOMY FUSION N/A 04/02/2018   Procedure: ANTERIOR CERVICAL DECOMPRESSION FUSION, CERVICAL FOUR-FIVE, CERVICAL FIVE-SIX, WITH INSTRUMENTATION AND ALLOGRAFT;  Surgeon: Phylliss Bob, MD;  Location: [facility];  Service: Orthopedics;  Laterality: N/A;  . COLONOSCOPY WITH ESOPHAGOGASTRODUODENOSCOPY (EGD)  05/04/2009  . CORONARY ARTERY BYPASS GRAFT N/A 06/14/2019   Procedure: CORONARY ARTERY BYPASS GRAFTING (CABG) using LIMA to LAD; Endoscopic Right Greater Saphenous Vein Harvest: SVG to Circ (distal);  SVG to RCA (distal).;  Surgeon: [name], MD;  Location: [facility];  Service: Open Heart Surgery;  Laterality: N/A;  . ENDOVEIN HARVEST OF GREATER SAPHENOUS VEIN Right 06/14/2019   Procedure: [place] Of Greater Saphenous Vein;  Surgeon: [name], MD;  Location: [facility];  Service: Open Heart Surgery;  Laterality: Right;  . LEFT HEART CATH AND CORONARY ANGIOGRAPHY N/A 06/11/2019   Procedure: LEFT HEART CATH AND CORONARY ANGIOGRAPHY;  Surgeon: Wellington Hampshire, MD;  Location: [facility];  Service: Cardiovascular;  Laterality: N/A;  . PIP JOINT FUSION Bilateral    pinky  . TEE WITHOUT CARDIOVERSION N/A 06/14/2019   Procedure: TRANSESOPHAGEAL ECHOCARDIOGRAM (TEE);  Surgeon: [name], MD;  Location: [facility];  Service: Open Heart Surgery;  Laterality: N/A;    Current Medications: Current Meds  Medication Sig  . acetaminophen (TYLENOL) 325 MG tablet Take 2 tablets (650 mg total) by mouth every 6 (six) hours as needed for mild pain.  Marland Kitchen aspirin EC 81 MG tablet Take 81 mg by mouth daily. Swallow whole.  Marland Kitchen atorvastatin (LIPITOR) 40 MG tablet TAKE 1 TABLET(40 MG) BY MOUTH DAILY  . Cyanocobalamin (VITAMIN B-12) 2500 MCG SUBL Place 2,500 mcg under the tongue daily.  . cyclobenzaprine (FLEXERIL) 10 MG tablet TAKE 1 TABLET(10 MG) BY MOUTH AT BEDTIME  . diphenoxylate-atropine (LOMOTIL) 2.5-0.025 MG tablet One to 2 tablets by mouth 4 times a day as needed for diarrhea.  . DULoxetine (CYMBALTA) 60 MG capsule TAKE 1 CAPSULE(60 MG) BY MOUTH DAILY  . esomeprazole (NEXIUM) 20 MG capsule Take 40 mg by mouth daily at 12 noon.   Marland Kitchen estradiol (ESTRACE) 1 MG tablet Take 1 tablet (1 mg total) by mouth daily.  . Famotidine 20 MG CHEW Chew 20 mg by mouth daily as needed (reflux).   . gabapentin (NEURONTIN) 800 MG tablet TAKE 1 TABLET BY MOUTH IN THE MORNING, 1 TABLET AT MIDDAY AND 2 TABLETS AT BEDTIME  . Insulin Glargine (BASAGLAR KWIKPEN) 100 UNIT/ML Inject 0.2 mLs (20 Units total) into the skin daily. (Patient taking differently: Inject 50 Units into the skin daily. )  . insulin lispro (HUMALOG KWIKPEN) 200 UNIT/ML KwikPen Inject 5 units as directed prior to lunch and dinner (Patient taking differently: Inject 10 Units into the skin See admin instructions. Inject 10 units as directed prior to lunch and dinner)  . Insulin Pen Needle (PEN NEEDLES) 31G X 8 MM MISC DM Dx E11.9 Inject into skin up to 3 times daily.  . INVOKANA 300 MG TABS tablet TAKE 1  TABLET(300 MG) BY MOUTH DAILY  . loteprednol (LOTEMAX) 0.5 % ophthalmic suspension Place 1 drop into the right eye daily as needed (dry eye).   . magnesium oxide (MAG-OX) 400 MG tablet Take 800 mg by mouth at bedtime.   . metoprolol tartrate (LOPRESSOR) 25 MG tablet Take 1 tablet (25 mg total) by mouth 2 (two) times daily.  . mirtazapine (REMERON) 45 MG tablet Take 1 tablet (45 mg total) by mouth at bedtime.  . nitroGLYCERIN (NITROSTAT) 0.4 MG SL tablet Place 1 tablet (0.4 mg total) under the tongue every 5 (five) minutes as needed for chest pain.  Marland Kitchen rOPINIRole (REQUIP) 1 MG tablet Take 3 mg by mouth at bedtime.  . TRULICITY 1.5 QB/3.4LP SOPN ADMINISTER 1.5 MG UNDER THE SKIN EVERY SUNDAY     Allergies:   Adhesive [tape], Ampicillin, Cefprozil, and Meloxicam   Social History   Socioeconomic History  . Marital status:  Married    Spouse name: Not on file  . Number of children: 2  . Years of education: Not on file  . Highest education level: Not on file  Occupational History  . Occupation: Tourist information centre manager: [employer]   Tobacco Use  . Smoking status: Never Smoker  . Smokeless tobacco: Never Used  Vaping Use  . Vaping Use: Never used  Substance and Sexual Activity  . Alcohol use: No  . Drug use: No  . Sexual activity: Yes    Partners: Male  Other Topics Concern  . Not on file  Social History Narrative  . Not on file   Social Determinants of Health   Financial Resource Strain:   . Difficulty of Paying Living Expenses: Not on file  Food Insecurity:   . Worried About Charity fundraiser in the Last Year: Not on file  . Ran Out of Food in the Last Year: Not on file  Transportation Needs:   . Lack of Transportation (Medical): Not on file  . Lack of Transportation (Non-Medical): Not on file  Physical Activity:   . Days of Exercise per Week: Not on file  . Minutes of Exercise per Session: Not on file  Stress:   . Feeling of Stress : Not on file  Social  Connections:   . Frequency of Communication with Friends and Family: Not on file  . Frequency of Social Gatherings with Friends and Family: Not on file  . Attends Religious Services: Not on file  . Active Member of Clubs or Organizations: Not on file  . Attends Archivist Meetings: Not on file  . Marital Status: Not on file     Family History: [name] family history includes Colon cancer in her mother; Colon polyps (age of onset: 25) in her daughter; Dementia in her mother; Diabetes in her brother, father, and mother; Epilepsy in her brother; Heart attack in her brother, father, and mother; High blood pressure in her brother and mother; Ovarian cancer in her mother. There is no history of Esophageal cancer, Rectal cancer, Stomach cancer, or Heart disease. ROS:   Please see the history of present illness.    All 14 point review of systems negative except as described per history of present illness  EKGs/Labs/Other Studies Reviewed:      Recent Labs: 06/15/2019: Magnesium 1.7 09/15/2019: ALT 11; BUN 15; Creat 0.52; Hemoglobin 12.7; Platelets 303; Potassium 4.1; Sodium 138  Recent Lipid Panel    Component Value Date/Time   CHOL 150 08/31/2019 0850   TRIG 255 (H) 08/31/2019 0850   HDL 49 08/31/2019 0850   CHOLHDL 3.1 08/31/2019 0850   CHOLHDL 3.7 09/30/2018 1218   LDLCALC 61 08/31/2019 0850   LDLCALC  09/30/2018 1218     Comment:     . LDL cholesterol not calculated. Triglyceride levels greater than 400 mg/dL invalidate calculated LDL results. . Reference range: <100 . Desirable range <100 mg/dL for primary prevention;   <70 mg/dL for patients with CHD or diabetic patients  with > or = 2 CHD risk factors. Marland Kitchen LDL-C is now calculated using the Martin-Hopkins  calculation, which is a validated novel method providing  better accuracy than the Friedewald equation in the  estimation of LDL-C.  Cresenciano Genre et al. Annamaria Helling. 2878;676(72): 2061-2068    (http://education.[facility].com/faq/FAQ164)     Physical Exam:    VS:  BP 120/74 (BP Location: Right Arm, Patient Position: Sitting, Cuff Size: Normal)  Pulse 88   Ht 5' (1.524 m)   Wt 158 lb (71.7 kg)   SpO2 93%   BMI 30.86 kg/m     Wt Readings from Last 3 Encounters:  11/30/19 158 lb (71.7 kg)  11/18/19 155 lb (70.3 kg)  09/30/19 157 lb (71.2 kg)     GEN:  Well nourished, well developed in no acute distress HEENT: Normal NECK: No JVD; No carotid bruits LYMPHATICS: No lymphadenopathy CARDIAC: RRR, no murmurs, no rubs, no gallops RESPIRATORY:  Clear to auscultation without rales, wheezing or rhonchi  ABDOMEN: Soft, non-tender, non-distended MUSCULOSKELETAL:  No edema; No deformity  SKIN: Warm and dry LOWER EXTREMITIES: no swelling NEUROLOGIC:  Alert and oriented x 3 PSYCHIATRIC:  Normal affect   ASSESSMENT:    1. S/P CABG x 3 LIMA to LAD, SVG sequential to RCA and circumflex in May 2021   2. Essential hypertension   3. Type 2 diabetes mellitus with diabetic neuropathy, with long-term current use of insulin ([place])   4. Hyperlipidemia LDL goal <70    PLAN:    In order of problems listed above:  1. Status post coronary bypass graft in May 2021 doing well from that point review we will continue present management. 2. Essential hypertension blood pressure well controlled continue present management. 3. Type 2 diabetes last hemoglobin A1c from K PN done on September 30, 2019 show 8.2 clearly not well controlled.  In the meantime she did some steps to improve it including just recently she is in dietitian.  Hopefully she will be able to accomplish the goal.  We did also talk about continuous glucose monitor. 4. Dyslipidemia: She is on high intense statin Lipitor 40 I will ask her to have fasting lipid profile done today.  Reviewing of her K PN showed LDL of 61 and HDL of 49 from 08/31/2019. 5. We did talk about healthy lifestyle need to exercise as well as good  diet.   Medication Adjustments/Labs and Tests Ordered: Current medicines are reviewed at length with the patient today.  Concerns regarding medicines are outlined above.  No orders of the defined types were placed in this encounter.  Medication changes: No orders of the defined types were placed in this encounter.   Signed, Park Liter, MD, [facility] 11/30/2019 3:38 PM    [place]

## 2019-11-30 NOTE — Patient Instructions (Signed)
Medication Instructions:  Your physician recommends that you continue on your current medications as directed. Please refer to the Current Medication list given to you today.  *If you need a refill on your cardiac medications before your next appointment, please call your pharmacy*   Lab Work: Your physician recommends that you return for lab work today: lipid If you have labs (blood work) drawn today and your tests are completely normal, you will receive your results only by: . MyChart Message (if you have MyChart) OR . A paper copy in the mail If you have any lab test that is abnormal or we need to change your treatment, we will call you to review the results.   Testing/Procedures: None   Follow-Up: At [facility], you and your health needs are our priority.  As part of our continuing mission to provide you with exceptional heart care, we have created designated Provider Care Teams.  These Care Teams include your primary Cardiologist (physician) and Advanced Practice Providers (APPs -  Physician Assistants and Nurse Practitioners) who all work together to provide you with the care you need, when you need it.  We recommend signing up for the patient portal called "MyChart".  Sign up information is provided on this After Visit Summary.  MyChart is used to connect with patients for Virtual Visits (Telemedicine).  Patients are able to view lab/test results, encounter notes, upcoming appointments, etc.  Non-urgent messages can be sent to your provider as well.   To learn more about what you can do with MyChart, go to [email or web address].    Your next appointment:   6 month(s)  The format for your next appointment:   In Person  Provider:   Robert Krasowski, MD   Other Instructions   

## 2019-11-30 NOTE — Addendum Note (Signed)
Addended by: Senaida Ores on: 11/30/2019 03:47 PM   Modules accepted: Orders

## 2019-12-01 LAB — LIPID PANEL
Chol/HDL Ratio: 3.7 ratio (ref 0.0–4.4)
Cholesterol, Total: 177 mg/dL (ref 100–199)
HDL: 48 mg/dL (ref >39)
LDL Chol Calc (NIH): 64 mg/dL (ref 0–99)
Triglycerides: 422 mg/dL — ABNORMAL HIGH (ref 0–149)
VLDL Cholesterol Cal: 65 mg/dL — ABNORMAL HIGH (ref 5–40)

## 2019-12-04 ENCOUNTER — Other Ambulatory Visit: Payer: Self-pay | Admitting: Osteopathic Medicine

## 2019-12-16 ENCOUNTER — Other Ambulatory Visit: Payer: Self-pay | Admitting: Sports Medicine

## 2019-12-25 ENCOUNTER — Other Ambulatory Visit: Payer: Self-pay | Admitting: Cardiology

## 2019-12-27 ENCOUNTER — Encounter: Payer: Self-pay | Admitting: Dietician

## 2019-12-27 ENCOUNTER — Encounter: Payer: BC Managed Care – PPO | Attending: Osteopathic Medicine | Admitting: Dietician

## 2019-12-27 ENCOUNTER — Other Ambulatory Visit: Payer: Self-pay

## 2019-12-27 DIAGNOSIS — Z794 Long term (current) use of insulin: Secondary | ICD-10-CM | POA: Insufficient documentation

## 2019-12-27 DIAGNOSIS — E1140 Type 2 diabetes mellitus with diabetic neuropathy, unspecified: Secondary | ICD-10-CM | POA: Diagnosis not present

## 2019-12-27 NOTE — Telephone Encounter (Signed)
Rx refill sent to pharmacy. 

## 2019-12-27 NOTE — Progress Notes (Signed)
Diabetes Self-Management Education  Visit Type:  Follow-up  Appt. Start Time: 1210 Appt. End Time: 1240  12/28/2019  [name], identified by name and date of birth, is a 59 y.o. female with a diagnosis of Diabetes:  .   APatient is here today alone.  She was last seen by this RD 11/18/2019 She states that she is feeling pretty well.  She is not starting the diet as she should.  Picked daughter up from [place] and working to encourage her to stay independent and free of drugs. She states that she is stress eating sweet foods. She is overall trying to choose healthier foods.  She made sugar free pineapple pudding this weekend. Checking her blood glucose more often.  Fasting 140 and 210 prior to dinner due to snack She has spoken with [facility] and they are contacting her doctor for a prescription. She forgets the Humalog at times. Walking 1 1/2 miles several times per week with her husband. Sleeping 7 hours per night  History includes Type 2 diabetes, recent colitis flair (now improved), CABG x3 05/2019, HTN, HLD, depression, GERD A1C 8.2% 09/30/2019, Cholesterol 15, HDL 49, LDL 61, Triglycerides 255, eGFR 103 Cholesterol 177, HDL 48, Triglycerides 422, LDL 64 11/30/2019 Fasting blood glucose 328 recently and usual in the high 100's Medications include:  Basaglar 50 units q HS, Humalog 10 units bid (before meals and usually eats only 2 meals daily).  She frequently forgets Humalog.  Invokana, Trulicity  Weight hx: 614 lbs 11/18/2019.  This is her usual weight currently. Highest weight 180 lbs Lowest adult weight 97 lbs prior to pregnancy.  Patient lives with her husband and adult daughter recently moved to [place].  They shop on line and she does the cooking. 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   Her husband was diagnosed with bipolar and OCD about 10 years ago when their teen daughter was found to use Heroin. Increased stress  overall. Increased intake of snacks at night (emotional eating). Increased intake of sugar free beverages.  SSESSMENT  Weight 160 lb (72.6 kg). Body mass index is 31.25 kg/m.    Diabetes Self-Management Education - 12/28/19 1706      Psychosocial Assessment   Self-care barriers Other (comment)   increased stress   Self-management support Doctor's office    Patient Concerns Nutrition/Meal planning;Healthy Lifestyle;Glycemic Control    Learning Readiness Ready      Pre-Education Assessment   Patient understands the diabetes disease and treatment process. Demonstrates understanding / competency    Patient understands incorporating nutritional management into lifestyle. Needs Review    Patient undertands incorporating physical activity into lifestyle. Demonstrates understanding / competency    Patient understands using medications safely. Demonstrates understanding / competency    Patient understands monitoring blood glucose, interpreting and using results Demonstrates understanding / competency    Patient understands prevention, detection, and treatment of acute complications. Demonstrates understanding / competency    Patient understands prevention, detection, and treatment of chronic complications. Demonstrates understanding / competency    Patient understands how to develop strategies to address psychosocial issues. Needs Review    Patient understands how to develop strategies to promote health/change behavior. Needs Review      Complications   How often do you check your blood sugar? 1-2 times/day    Fasting Blood glucose range (mg/dL) 130-179    Postprandial Blood glucose range (mg/dL) >200;180-200      Dietary Intake  Breakfast Pancake, fresh fruit, whipped cream (out)    Lunch hot dog or ham sandwich or vegetable plate    Dinner roast, carrots, potatoes, onions, 2 coconut pecan cookies, sugar free popsickles    Beverage(s) water, sugar free beverages      Exercise    Exercise Type Light (walking / raking leaves)    How many days per week to you exercise? 5    How many minutes per day do you exercise? 30    Total minutes per week of exercise 150      Patient Education   Previous Diabetes Education Yes (please comment)   10/2019   Nutrition management  Meal options for control of blood glucose level and chronic complications.    Physical activity and exercise  Other (comment)   encouraged changes   Monitoring Identified appropriate SMBG and/or A1C goals.    Psychosocial adjustment Worked with patient to identify barriers to care and solutions;Identified and addressed patients feelings and concerns about diabetes      Individualized Goals (developed by patient)   Nutrition General guidelines for healthy choices and portions discussed    Physical Activity Exercise 5-7 days per week;30 minutes per day    Medications take my medication as prescribed    Monitoring  test my blood glucose as discussed    Reducing Risk increase portions of healthy fats    Health Coping discuss diabetes with (comment)      Patient Self-Evaluation of Goals - Patient rates self as meeting previously set goals (% of time)   Nutrition 50 - 75 %    Physical Activity >75%    Medications 50 - 75 %    Monitoring >75%    Problem Solving 50 - 75 %    Reducing Risk 50 - 75 %    Health Coping 50 - 75 %      Post-Education Assessment   Patient understands the diabetes disease and treatment process. Demonstrates understanding / competency    Patient understands incorporating nutritional management into lifestyle. Needs Review    Patient undertands incorporating physical activity into lifestyle. Demonstrates understanding / competency    Patient understands using medications safely. Demonstrates understanding / competency    Patient understands monitoring blood glucose, interpreting and using results Demonstrates understanding / competency    Patient understands prevention, detection,  and treatment of acute complications. Demonstrates understanding / competency    Patient understands prevention, detection, and treatment of chronic complications. Demonstrates understanding / competency    Patient understands how to develop strategies to address psychosocial issues. Needs Review    Patient understands how to develop strategies to promote health/change behavior. Needs Review      Outcomes   Program Status Not Completed      Subsequent Visit   Since your last visit have you experienced any weight changes? Gain    Weight Gain (lbs) 5    Since your last visit, are you checking your blood glucose at least once a day? Yes           Learning Objective:  Patient will have a greater understanding of diabetes self-management. Patient education plan is to attend individual and/or group sessions per assessed needs and concerns.   Plan:   Patient Instructions  Recommend [place]  Consider starting the vitamin D Continue to check your blood sugar  - Call when your Dexcom arives  Consider alternative to snacking at night.  Small portion eaten away from distraction.             Avoid buying so tempting items are is not in the house.             Consider food free zones.             Finding pleasure- games on your phone, puzzle, a new hobby, walk, etc. Eat slowly, stop when satisfied. Consider avoiding food with Inulin as an ingredient   Quality sleep. Aim for 30 minutes most days (exercise) What can you do to remember to take your Humalog prior to meals.  Plan:  Aim for 2 Carb Choices per meal (30 grams) +/- 1 either way  Aim for 0-1 Carbs per snack if hungry  Include protein in moderation with your meals and snacks Consider reading food labels for Total Carbohydrate of foods Consider checking BG at alternate times per day              Consider the [facility] or [facility] Continue taking medication as directed by MD             How can you remember to  take the Humalog before meals.     Expected Outcomes:  Demonstrated interest in learning. Expect positive outcomes  Education material provided:   If problems or questions, patient to contact team via:  Phone  Future DSME appointment: - 4-6 wks

## 2019-12-27 NOTE — Patient Instructions (Addendum)
Recommend [place]  Consider starting the vitamin D Continue to check your blood sugar  - Call when your Dexcom arives  Consider alternative to snacking at night.             Small portion eaten away from distraction.             Avoid buying so tempting items are is not in the house.             Consider food free zones.             Finding pleasure- games on your phone, puzzle, a new hobby, walk, etc. Eat slowly, stop when satisfied. Consider avoiding food with Inulin as an ingredient   Quality sleep. Aim for 30 minutes most days (exercise) What can you do to remember to take your Humalog prior to meals.  Plan:  Aim for 2 Carb Choices per meal (30 grams) +/- 1 either way  Aim for 0-1 Carbs per snack if hungry  Include protein in moderation with your meals and snacks Consider reading food labels for Total Carbohydrate of foods Consider checking BG at alternate times per day              Consider the [facility] or [facility] Continue taking medication as directed by MD             How can you remember to take the Humalog before meals.

## 2019-12-30 ENCOUNTER — Ambulatory Visit (INDEPENDENT_AMBULATORY_CARE_PROVIDER_SITE_OTHER): Payer: BC Managed Care – PPO | Admitting: Osteopathic Medicine

## 2019-12-30 ENCOUNTER — Encounter: Payer: Self-pay | Admitting: Osteopathic Medicine

## 2019-12-30 VITALS — BP 139/85 | HR 68 | Temp 98.2°F | Wt 160.0 lb

## 2019-12-30 DIAGNOSIS — IMO0002 Reserved for concepts with insufficient information to code with codable children: Secondary | ICD-10-CM

## 2019-12-30 DIAGNOSIS — E1165 Type 2 diabetes mellitus with hyperglycemia: Secondary | ICD-10-CM

## 2019-12-30 DIAGNOSIS — E1140 Type 2 diabetes mellitus with diabetic neuropathy, unspecified: Secondary | ICD-10-CM | POA: Diagnosis not present

## 2019-12-30 DIAGNOSIS — G2581 Restless legs syndrome: Secondary | ICD-10-CM | POA: Diagnosis not present

## 2019-12-30 LAB — POCT GLYCOSYLATED HEMOGLOBIN (HGB A1C): Hemoglobin A1C: 8.1 % — AB (ref 4.0–5.6)

## 2019-12-30 MED ORDER — DAPAGLIFLOZIN PROPANEDIOL 10 MG PO TABS: 10.0000 mg | ORAL_TABLET | Freq: Every day | ORAL | 3 refills | Status: DC

## 2019-12-30 MED ORDER — BASAGLAR KWIKPEN 100 UNIT/ML ~~LOC~~ SOPN: 50.0000 [IU] | PEN_INJECTOR | Freq: Every day | SUBCUTANEOUS | 11 refills | Status: DC

## 2019-12-30 MED ORDER — ROPINIROLE HCL 3 MG PO TABS
3.0000 mg | ORAL_TABLET | Freq: Every day | ORAL | 3 refills | Status: DC
Start: 2019-12-30 — End: 2020-12-22

## 2019-12-30 NOTE — Progress Notes (Signed)
Tonya Chandler is a 59 y.o. female who presents to  [place] at [facility]  today, 12/30/19, seeking care for the following:  . DM2 follow-up - seeing nutritionist, has started walking and planning on slowly increasing distance over the next couple months. A1C stable. Still only sporadically taking her Humalog mealtime. Up to 50 units Basaglar daily.  Marland Kitchen RLS - last visit we increased Requip from 1 mg qhs to titrate up to max 3 mg. She reports this is working well.  . Hand pain - following w/ sports medicine for arthritis issues      ASSESSMENT & PLAN with other pertinent findings:  The primary encounter diagnosis was Type 2 diabetes, uncontrolled, with neuropathy ([place]). A diagnosis of Restless legs syndrome was also pertinent to this visit.   Results for orders placed or performed in visit on 12/30/19 (from the past 24 hour(s))  POCT HgB A1C     Status: Abnormal   Collection Time: 12/30/19 10:43 AM  Result Value Ref Range   Hemoglobin A1C 8.1 (A) 4.0 - 5.6 %   HbA1c POC (<> result, manual entry)     HbA1c, POC (prediabetic range)     HbA1c, POC (controlled diabetic range)         There are no Patient Instructions on file for this visit.  Orders Placed This Encounter  Procedures  . POCT HgB A1C    Meds ordered this encounter  Medications  . Insulin Glargine (BASAGLAR KWIKPEN) 100 UNIT/ML    Sig: Inject 50-60 Units into the skin daily.    Dispense:  45 mL    Refill:  11  . dapagliflozin propanediol (FARXIGA) 10 MG TABS tablet    Sig: Take 1 tablet (10 mg total) by mouth daily.    Dispense:  90 tablet    Refill:  3  . rOPINIRole (REQUIP) 3 MG tablet    Sig: Take 1 tablet (3 mg total) by mouth at bedtime.    Dispense:  90 tablet    Refill:  3       Follow-up instructions: Return in about 3 months (around 03/29/2020) for RECHECK A1C - SEE [place] SOONER IF NEEDED  .                                         BP 139/85 (BP Location: Left Arm, Patient Position: Sitting, Cuff Size: Normal)   Pulse 68   Temp 98.2 F (36.8 C) (Oral)   Wt 160 lb 0.6 oz (72.6 kg)   BMI 31.26 kg/m   Current Meds  Medication Sig  . acetaminophen (TYLENOL) 325 MG tablet Take 2 tablets (650 mg total) by mouth every 6 (six) hours as needed for mild pain.  Marland Kitchen aspirin EC 81 MG tablet Take 81 mg by mouth daily. Swallow whole.  Marland Kitchen atorvastatin (LIPITOR) 40 MG tablet TAKE 1 TABLET(40 MG) BY MOUTH DAILY  . Cyanocobalamin (VITAMIN B-12) 2500 MCG SUBL Place 2,500 mcg under the tongue daily.  . cyclobenzaprine (FLEXERIL) 10 MG tablet TAKE 1 TABLET(10 MG) BY MOUTH AT BEDTIME  . diphenoxylate-atropine (LOMOTIL) 2.5-0.025 MG tablet One to 2 tablets by mouth 4 times a day as needed for diarrhea.  . DULoxetine (CYMBALTA) 60 MG capsule TAKE 1 CAPSULE(60 MG) BY MOUTH DAILY  . esomeprazole (NEXIUM) 20 MG capsule Take 40 mg by mouth daily at 12 noon.   Marland Kitchen  estradiol (ESTRACE) 1 MG tablet Take 1 tablet (1 mg total) by mouth daily.  . Famotidine 20 MG CHEW Chew 20 mg by mouth daily as needed (reflux).   . gabapentin (NEURONTIN) 800 MG tablet TAKE 1 TABLET BY MOUTH IN THE MORNING, 1 TABLET AT MIDDAY AND 2 TABLETS AT BEDTIME  . insulin lispro (HUMALOG KWIKPEN) 200 UNIT/ML KwikPen Inject 5 units as directed prior to lunch and dinner (Patient taking differently: Inject 10 Units into the skin See admin instructions. Inject 10 units as directed prior to lunch and dinner)  . Insulin Pen Needle (PEN NEEDLES) 31G X 8 MM MISC DM Dx E11.9 Inject into skin up to 3 times daily.  Marland Kitchen lisinopril (ZESTRIL) 2.5 MG tablet TAKE 1 TABLET(2.5 MG) BY MOUTH DAILY  . loteprednol (LOTEMAX) 0.5 % ophthalmic suspension Place 1 drop into the right eye daily as needed (dry eye).   . magnesium oxide (MAG-OX) 400 MG tablet Take 800 mg by mouth at bedtime.   . metoprolol tartrate (LOPRESSOR) 25 MG  tablet Take 1 tablet (25 mg total) by mouth 2 (two) times daily.  . mirtazapine (REMERON) 45 MG tablet Take 1 tablet (45 mg total) by mouth at bedtime.  . nitroGLYCERIN (NITROSTAT) 0.4 MG SL tablet Place 1 tablet (0.4 mg total) under the tongue every 5 (five) minutes as needed for chest pain.  . TRULICITY 1.5 YT/0.3TW SOPN INJECT 1.5 MG UNDER THE SKIN EVERY SUNDAY  . [DISCONTINUED] Insulin Glargine (BASAGLAR KWIKPEN) 100 UNIT/ML Inject 0.2 mLs (20 Units total) into the skin daily. (Patient taking differently: Inject 50 Units into the skin daily.)  . [DISCONTINUED] INVOKANA 300 MG TABS tablet TAKE 1 TABLET(300 MG) BY MOUTH DAILY  . [DISCONTINUED] rOPINIRole (REQUIP) 1 MG tablet Take 3 mg by mouth at bedtime.    Results for orders placed or performed in visit on 12/30/19 (from the past 72 hour(s))  POCT HgB A1C     Status: Abnormal   Collection Time: 12/30/19 10:43 AM  Result Value Ref Range   Hemoglobin A1C 8.1 (A) 4.0 - 5.6 %   HbA1c POC (<> result, manual entry)     HbA1c, POC (prediabetic range)     HbA1c, POC (controlled diabetic range)      No results found.     All questions at time of visit were answered - patient instructed to contact office with any additional concerns or updates.  ER/RTC precautions were reviewed with the patient as applicable.   Please note: voice recognition software was used to produce this document, and typos may escape review. Please contact Dr. Sheppard Coil for any needed clarifications.

## 2020-01-01 ENCOUNTER — Other Ambulatory Visit: Payer: Self-pay | Admitting: Osteopathic Medicine

## 2020-01-14 ENCOUNTER — Other Ambulatory Visit: Payer: Self-pay | Admitting: Osteopathic Medicine

## 2020-01-25 ENCOUNTER — Telehealth (INDEPENDENT_AMBULATORY_CARE_PROVIDER_SITE_OTHER): Payer: BC Managed Care – PPO | Admitting: Medical-Surgical

## 2020-01-25 ENCOUNTER — Encounter: Payer: Self-pay | Admitting: Medical-Surgical

## 2020-01-25 DIAGNOSIS — J0100 Acute maxillary sinusitis, unspecified: Secondary | ICD-10-CM | POA: Diagnosis not present

## 2020-01-25 MED ORDER — AZITHROMYCIN 250 MG PO TABS: ORAL_TABLET | ORAL | 0 refills | Status: DC

## 2020-01-25 NOTE — Progress Notes (Signed)
Virtual Visit via Video Note  I connected with Tonya Chandler on 01/25/20 at  1:00 PM EST by a video enabled telemedicine application and verified that I am speaking with the correct person using two identifiers.   I discussed the limitations of evaluation and management by telemedicine and the availability of in person appointments. The patient expressed understanding and agreed to proceed.  Patient location: home Provider locations: office  Subjective:    CC: Upper respiratory symptoms  HPI: Pleasant 60 year old female presenting via MyChart video visit with reports of several weeks of upper respiratory symptoms including postnasal drip, cough, and right earache.  She has also developed a headache behind her right eye, maxillary sinus tenderness bilaterally, and right frontal sinus tenderness.  Notes that she coughed up a large amount of green mucus that was blood-tinged the other day.  Has started having a bit of dental pain when bending over.  No shortness of breath but does note that she has had some burning discomfort in the center of her chest just over her bypass scar.  Denies fever, chills, and GI symptoms.  Has been using a Nettie pot for nasal saline rinses but no other interventions attempted.  Past medical history, Surgical history, Family history not pertinant except as noted below, Social history, Allergies, and medications have been entered into the medical record, reviewed, and corrections made.   Review of Systems: See HPI for pertinent positives and negatives.   Objective:    General: Speaking clearly in complete sentences without any shortness of breath.  Alert and oriented x3.  Normal judgment. No apparent acute distress.  Impression and Recommendations:    1. Acute non-recurrent maxillary sinusitis After several weeks of symptoms, low suspicion for viral etiology but with worsening symptoms over the last few days, recommend Covid testing.  Treating for sinusitis with  azithromycin.  Continue as needed nasal saline rinses.  Consider using a coolmist humidifier.  Increase p.o. fluids.  Consider Mucinex but avoid the decongestant formulation.  I discussed the assessment and treatment plan with the patient. The patient was provided an opportunity to ask questions and all were answered. The patient agreed with the plan and demonstrated an understanding of the instructions.   The patient was advised to call back or seek an in-person evaluation if the symptoms worsen or if the condition fails to improve as anticipated.  20 minutes of non-face-to-face time was provided during this encounter.  Return if symptoms worsen or fail to improve.  Thayer Ohm, DNP, APRN, FNP-BC [facility] [place] [facility]

## 2020-01-27 DIAGNOSIS — H2513 Age-related nuclear cataract, bilateral: Secondary | ICD-10-CM | POA: Diagnosis not present

## 2020-01-27 DIAGNOSIS — E119 Type 2 diabetes mellitus without complications: Secondary | ICD-10-CM | POA: Diagnosis not present

## 2020-01-27 LAB — HM DIABETES EYE EXAM

## 2020-01-31 ENCOUNTER — Other Ambulatory Visit: Payer: BC Managed Care – PPO

## 2020-01-31 DIAGNOSIS — Z20822 Contact with and (suspected) exposure to covid-19: Secondary | ICD-10-CM

## 2020-02-01 DIAGNOSIS — Z20822 Contact with and (suspected) exposure to covid-19: Secondary | ICD-10-CM | POA: Diagnosis not present

## 2020-02-03 LAB — SARS-COV-2, NAA 2 DAY TAT

## 2020-02-03 LAB — NOVEL CORONAVIRUS, NAA: SARS-CoV-2, NAA: NOT DETECTED

## 2020-02-07 ENCOUNTER — Encounter: Payer: Self-pay | Admitting: Osteopathic Medicine

## 2020-02-10 DIAGNOSIS — R0982 Postnasal drip: Secondary | ICD-10-CM

## 2020-02-10 HISTORY — DX: Postnasal drip: R09.82

## 2020-02-25 ENCOUNTER — Other Ambulatory Visit: Payer: Self-pay

## 2020-02-25 DIAGNOSIS — M5412 Radiculopathy, cervical region: Secondary | ICD-10-CM

## 2020-02-25 MED ORDER — GABAPENTIN 800 MG PO TABS: ORAL_TABLET | ORAL | 3 refills | Status: DC

## 2020-02-28 ENCOUNTER — Ambulatory Visit: Payer: BC Managed Care – PPO | Admitting: Dietician

## 2020-02-28 ENCOUNTER — Encounter: Payer: Self-pay | Admitting: Osteopathic Medicine

## 2020-02-28 ENCOUNTER — Telehealth (INDEPENDENT_AMBULATORY_CARE_PROVIDER_SITE_OTHER): Payer: BC Managed Care – PPO | Admitting: Osteopathic Medicine

## 2020-02-28 VITALS — BP 156/91 | HR 76 | Temp 97.7°F | Wt 160.2 lb

## 2020-02-28 DIAGNOSIS — J029 Acute pharyngitis, unspecified: Secondary | ICD-10-CM

## 2020-02-28 DIAGNOSIS — Z20822 Contact with and (suspected) exposure to covid-19: Secondary | ICD-10-CM | POA: Diagnosis not present

## 2020-02-28 MED ORDER — CEPHALEXIN 500 MG PO TABS
500.0000 mg | ORAL_TABLET | Freq: Two times a day (BID) | ORAL | 0 refills | Status: DC
Start: 2020-02-28 — End: 2020-04-03

## 2020-02-28 MED ORDER — LIDOCAINE VISCOUS HCL 2 % MT SOLN: 5.0000 mL | OROMUCOSAL | 1 refills | Status: DC | PRN

## 2020-02-28 NOTE — Progress Notes (Signed)
Telemedicine Visit via  Video & Audio (App used: MyChart) \  I connected with Tonya Chandler on 02/28/20 at 10:20 AM  by phone or  telemedicine application as noted above  I verified that I am speaking with or regarding  the correct patient using two identifiers.  Participants: Myself, Dr Emeterio Reeve DO Patient: Tonya Chandler Patient proxy if applicable: none Other, if applicable: none  Patient is at home I am in office at [facility]    I discussed the limitations of evaluation and management  by telemedicine and the availability of in person appointments.  The participant(s) above expressed understanding and  agreed to proceed with this appointment via telemedicine.       History of Present Illness: Tonya Chandler is a 60 y.o. female who would like to discuss possible COVID     Weds 02/23/20 (+) exposure - Patient and her exposure were both wearing masks, but usually she has KN95 this time she was wearing a cloth mask Thurs 02/24/20 onset symptoms: sore throat. yesterday was hurting to swallow anything, noted small white bumps on back of throat. Today about the same. Tender LN on L anterior throat but no trismus/drooling, no fever. Known sinus/ENT issues about the same: feeling few mos like needing to clear her throat, noting some hoarseness. R sided ear/winus pressure and burning behind R eye, ENT thought might be related to GERD and double Nexium.    Observations/Objective: BP (!) 156/91   Pulse 76   Temp 97.7 F (36.5 C)   Wt 160 lb 3.2 oz (72.7 kg)   BMI 31.29 kg/m  BP Readings from Last 3 Encounters:  02/28/20 (!) 156/91  12/30/19 139/85  11/30/19 120/74   Exam: Normal Speech.  NAD  Lab and Radiology Results No results found for this or any previous visit (from the past 72 hour(s)). No results found.  Immunization History  Administered Date(s) Administered  . Influenza,inj,Quad PF,6+ Mos 11/05/2014, 10/12/2017, 10/12/2017, 09/30/2018, 11/30/2019   . Influenza-Unspecified 10/22/2014, 10/13/2015  . PFIZER(Purple Top)SARS-COV-2 Vaccination 03/26/2019, 04/20/2019, 11/18/2019  . Tdap 07/22/2011  . Zoster Recombinat (Shingrix) 09/30/2018      Assessment and Plan: 60 y.o. female with The primary encounter diagnosis was Suspected COVID-19 virus infection. A diagnosis of Pharyngitis, unspecified etiology was also pertinent to this visit.   PDMP not reviewed this encounter. No orders of the defined types were placed in this encounter.  Meds ordered this encounter  Medications  . lidocaine (XYLOCAINE) 2 % solution    Sig: Use as directed 5-10 mLs in the mouth or throat every 3 (three) hours as needed (mouth/throat pain).    Dispense:  100 mL    Refill:  1  . Cephalexin 500 MG tablet    Sig: Take 1 tablet (500 mg total) by mouth 2 (two) times daily.    Dispense:  14 tablet    Refill:  0   Patient Instructions  Will treat as poosible strep - I sent anttibiotics and lidocaine for sore throat into pharmacy. Rapid COVID test tomorrow or Weds - if negative, and feeling ok (no fever/cough), ok to return to normal duties as long as masked (prefer surgical or ideally N95/KN95 mask). If positive OR if still feeling sick, stay home for at least 0 days form symptom onset (back to work 03/06/20) OK to use Ibuprofen 600 mg every 8 hours, Aleve 375 mg twice daily, Tylenol up to 1000 mg every 8 hours.      Instructions sent via MyChart.  Follow Up Instructions: Return if symptoms worsen or fail to improve.    I discussed the assessment and treatment plan with the patient. The patient was provided an opportunity to ask questions and all were answered. The patient agreed with the plan and demonstrated an understanding of the instructions.   The patient was advised to call back or seek an in-person evaluation if any new concerns, if symptoms worsen or if the condition fails to improve as anticipated.  30 minutes of non-face-to-face time was  provided during this encounter.      . . . . . . . . . . . . . Marland Kitchen                   Historical information moved to improve visibility of documentation.  Past Medical History:  Diagnosis Date  . Abnormal ECG 04/01/2018  . Abnormal stress echocardiogram 05/24/2019   Formatting of this note might be different from the original. Added automatically from request for surgery [id number]  . Anemia   . Anxiety   . Atherosclerosis of native coronary artery with angina pectoris ([place]) 05/10/2015   Formatting of this note might be different from the original. 30% lesion-[place]  . Bilateral carpal tunnel syndrome 05/23/2017  . Bilateral knee pain 02/16/2010   Qualifier: Diagnosis of  By: Koleen Nimrod MD, Dellis Filbert    . Carpal tunnel syndrome, right 12/16/2017  . Colon polyps   . Depression   . DEPRESSION 02/16/2010   Qualifier: Diagnosis of  By: Koleen Nimrod MD, Dellis Filbert    . Depression, major, in partial remission ([place]) 07/14/2015  . Diabetes mellitus   . Diverticulosis   . Dupuytren's contracture of right hand 06/24/2017  . Essential hypertension 02/16/2010   Qualifier: Diagnosis of  By: Koleen Nimrod MD, Dellis Filbert    . Gastro-esophageal reflux   . Gastrointestinal ulcer   . Generalized anxiety disorder 03/14/2015  . GERD 02/16/2010   Qualifier: Diagnosis of  By: Koleen Nimrod MD, Dellis Filbert    . H/O: GI bleed    Michela Pitcher it was due to taking metformin  . Hyperlipidemia   . Hyperlipidemia LDL goal <70 02/16/2010   Qualifier: Diagnosis of  By: Koleen Nimrod MD, Dellis Filbert    . Hypertension   . IBS (irritable bowel syndrome)   . Impingement syndrome of shoulder, right 06/24/2017  . Midline low back pain with left-sided sciatica 12/29/2013  . Musculoskeletal chest pain 05/04/2015  . Obesity   . Precordial pain 05/04/2015  . Primary insomnia 03/14/2015  . Radiculitis of right cervical region 01/20/2018  . Radiculopathy 04/02/2018  . Right hand pain 06/24/2017  . RLS (restless legs syndrome) 07/14/2015  . Type 2  diabetes mellitus with diabetic neuropathy, with long-term current use of insulin ([place]) 02/16/2010   Qualifier: Diagnosis of  By: Koleen Nimrod MD, Dellis Filbert     Past Surgical History:  Procedure Laterality Date  . ABDOMINAL HYSTERECTOMY    . ANTERIOR CERVICAL DECOMP/DISCECTOMY FUSION N/A 04/02/2018   Procedure: ANTERIOR CERVICAL DECOMPRESSION FUSION, CERVICAL FOUR-FIVE, CERVICAL FIVE-SIX, WITH INSTRUMENTATION AND ALLOGRAFT;  Surgeon: Phylliss Bob, MD;  Location: [place];  Service: Orthopedics;  Laterality: N/A;  . COLONOSCOPY WITH ESOPHAGOGASTRODUODENOSCOPY (EGD)  05/04/2009  . CORONARY ARTERY BYPASS GRAFT N/A 06/14/2019   Procedure: CORONARY ARTERY BYPASS GRAFTING (CABG) using LIMA to LAD; Endoscopic Right Greater Saphenous Vein Harvest: SVG to Circ (distal); SVG to RCA (distal).;  Surgeon: [name], MD;  Location: [place];  Service: Open Heart Surgery;  Laterality: N/A;  . ENDOVEIN HARVEST OF  GREATER SAPHENOUS VEIN Right 06/14/2019   Procedure: [place] Of Greater Saphenous Vein;  Surgeon: [name], MD;  Location: [place];  Service: Open Heart Surgery;  Laterality: Right;  . LEFT HEART CATH AND CORONARY ANGIOGRAPHY N/A 06/11/2019   Procedure: LEFT HEART CATH AND CORONARY ANGIOGRAPHY;  Surgeon: Wellington Hampshire, MD;  Location: [place] [facility];  Service: Cardiovascular;  Laterality: N/A;  . PIP JOINT FUSION Bilateral    pinky  . TEE WITHOUT CARDIOVERSION N/A 06/14/2019   Procedure: TRANSESOPHAGEAL ECHOCARDIOGRAM (TEE);  Surgeon: [name], MD;  Location: [place];  Service: Open Heart Surgery;  Laterality: N/A;   Social History   Tobacco Use  . Smoking status: Never Smoker  . Smokeless tobacco: Never Used  Substance Use Topics  . Alcohol use: No   family history includes Colon cancer in her mother; Colon polyps (age of onset: 45) in her daughter; Dementia in her mother; Diabetes in her brother, father, and mother; Epilepsy in her brother; Heart attack in her  brother, father, and mother; High blood pressure in her brother and mother; Ovarian cancer in her mother.  Medications: Current Outpatient Medications  Medication Sig Dispense Refill  . acetaminophen (TYLENOL) 325 MG tablet Take 2 tablets (650 mg total) by mouth every 6 (six) hours as needed for mild pain.    Marland Kitchen aspirin EC 81 MG tablet Take 81 mg by mouth daily. Swallow whole.    Marland Kitchen atorvastatin (LIPITOR) 40 MG tablet TAKE 1 TABLET(40 MG) BY MOUTH DAILY 90 tablet 3  . Cephalexin 500 MG tablet Take 1 tablet (500 mg total) by mouth 2 (two) times daily. 14 tablet 0  . Cyanocobalamin (VITAMIN B-12) 2500 MCG SUBL Place 2,500 mcg under the tongue daily.    . cyclobenzaprine (FLEXERIL) 10 MG tablet TAKE 1 TABLET(10 MG) BY MOUTH AT BEDTIME 30 tablet 3  . dapagliflozin propanediol (FARXIGA) 10 MG TABS tablet Take 1 tablet (10 mg total) by mouth daily. 90 tablet 3  . diphenoxylate-atropine (LOMOTIL) 2.5-0.025 MG tablet One to 2 tablets by mouth 4 times a day as needed for diarrhea. 30 tablet 1  . DULoxetine (CYMBALTA) 60 MG capsule TAKE 1 CAPSULE(60 MG) BY MOUTH DAILY 90 capsule 3  . esomeprazole (NEXIUM) 20 MG capsule Take 40 mg by mouth daily at 12 noon.     Marland Kitchen estradiol (ESTRACE) 1 MG tablet Take 1 tablet (1 mg total) by mouth daily. 90 tablet 3  . Famotidine 20 MG CHEW Chew 20 mg by mouth daily as needed (reflux).     . gabapentin (NEURONTIN) 800 MG tablet TAKE 1 TABLET BY MOUTH IN THE MORNING, 1 TABLET AT MIDDAY AND 2 TABLETS AT BEDTIME 120 tablet 3  . Insulin Glargine (BASAGLAR KWIKPEN) 100 UNIT/ML Inject 50-60 Units into the skin daily. 45 mL 11  . insulin lispro (HUMALOG KWIKPEN) 200 UNIT/ML KwikPen Inject 5 units as directed prior to lunch and dinner (Patient taking differently: Inject 10 Units into the skin See admin instructions. Inject 10 units as directed prior to lunch and dinner) 2 pen 5  . Insulin Pen Needle (PEN NEEDLES) 31G X 8 MM MISC DM Dx E11.9 Inject into skin up to 3 times daily. 100  each prn  . lidocaine (XYLOCAINE) 2 % solution Use as directed 5-10 mLs in the mouth or throat every 3 (three) hours as needed (mouth/throat pain). 100 mL 1  . lisinopril (ZESTRIL) 2.5 MG tablet TAKE 1 TABLET(2.5 MG) BY MOUTH DAILY 90 tablet 1  .  loteprednol (LOTEMAX) 0.5 % ophthalmic suspension Place 1 drop into the right eye daily as needed (dry eye).     . magnesium oxide (MAG-OX) 400 MG tablet Take 800 mg by mouth at bedtime.     . metoprolol tartrate (LOPRESSOR) 25 MG tablet Take 1 tablet (25 mg total) by mouth 2 (two) times daily. 180 tablet 3  . mirtazapine (REMERON) 45 MG tablet Take 1 tablet (45 mg total) by mouth at bedtime. 90 tablet 3  . nitroGLYCERIN (NITROSTAT) 0.4 MG SL tablet Place 1 tablet (0.4 mg total) under the tongue every 5 (five) minutes as needed for chest pain. 20 tablet 1  . rOPINIRole (REQUIP) 3 MG tablet Take 1 tablet (3 mg total) by mouth at bedtime. 90 tablet 3  . TRULICITY 1.5 0000000 SOPN ADMINISTER 1.5 MG UNDER THE SKIN EVERY SUNDAY 3 mL 2   No current facility-administered medications for this visit.   Allergies  Allergen Reactions  . Adhesive [Tape] Hives    Welts--PLEASE USE HYPO ALLERGIC TAPE  . Ampicillin Diarrhea    Did it involve swelling of the face/tongue/throat, SOB, or low BP? No Did it involve sudden or severe rash/hives, skin peeling, or any reaction on the inside of your mouth or nose? No Did you need to seek medical attention at a hospital or doctor's office? No When did it last happen?30-40 years ago If all above answers are "NO", may proceed with cephalosporin use.   Marland Kitchen Cefprozil Other (See Comments)    Acid reflux/indigestion issues  . Meloxicam Other (See Comments)    Acid reflux/indigestion issues     If phone visit, billing and coding can please add appropriate modifier if needed                                                                 +

## 2020-02-28 NOTE — Patient Instructions (Addendum)
Will treat as poosible strep - I sent anttibiotics and lidocaine for sore throat into pharmacy. Rapid COVID test tomorrow or Weds - if negative, and feeling ok (no fever/cough), ok to return to normal duties as long as masked (prefer surgical or ideally N95/KN95 mask). If positive OR if still feeling sick, stay home for at least 0 days form symptom onset (back to work 03/06/20) OK to use Ibuprofen 600 mg every 8 hours, Aleve 375 mg twice daily, Tylenol up to 1000 mg every 8 hours.

## 2020-03-01 ENCOUNTER — Other Ambulatory Visit: Payer: BC Managed Care – PPO

## 2020-03-01 DIAGNOSIS — Z20822 Contact with and (suspected) exposure to covid-19: Secondary | ICD-10-CM | POA: Diagnosis not present

## 2020-03-02 ENCOUNTER — Telehealth: Payer: BC Managed Care – PPO | Admitting: Osteopathic Medicine

## 2020-03-02 LAB — SARS-COV-2, NAA 2 DAY TAT

## 2020-03-02 LAB — NOVEL CORONAVIRUS, NAA: SARS-CoV-2, NAA: NOT DETECTED

## 2020-03-08 ENCOUNTER — Telehealth: Payer: Self-pay

## 2020-03-08 ENCOUNTER — Encounter: Payer: Self-pay | Admitting: Osteopathic Medicine

## 2020-03-08 MED ORDER — FLUCONAZOLE 150 MG PO TABS: 150.0000 mg | ORAL_TABLET | Freq: Once | ORAL | 1 refills | Status: AC

## 2020-03-08 NOTE — Telephone Encounter (Signed)
Pt called requesting a rx for thrush and yeast infection. Pt stated that Cephalexin caused her to have new symptoms. Pls advise, thanks.

## 2020-03-24 ENCOUNTER — Other Ambulatory Visit: Payer: Self-pay | Admitting: Osteopathic Medicine

## 2020-03-28 ENCOUNTER — Ambulatory Visit (INDEPENDENT_AMBULATORY_CARE_PROVIDER_SITE_OTHER): Payer: BC Managed Care – PPO | Admitting: Family Medicine

## 2020-03-28 ENCOUNTER — Encounter: Payer: Self-pay | Admitting: Family Medicine

## 2020-03-28 ENCOUNTER — Other Ambulatory Visit: Payer: Self-pay

## 2020-03-28 VITALS — BP 126/85 | HR 81 | Temp 97.9°F | Wt 162.1 lb

## 2020-03-28 DIAGNOSIS — N898 Other specified noninflammatory disorders of vagina: Secondary | ICD-10-CM | POA: Diagnosis not present

## 2020-03-28 DIAGNOSIS — R300 Dysuria: Secondary | ICD-10-CM | POA: Diagnosis not present

## 2020-03-28 LAB — WET PREP FOR TRICH, YEAST, CLUE
MICRO NUMBER:: 11620897
Specimen Quality: ADEQUATE

## 2020-03-28 LAB — POCT URINALYSIS DIP (CLINITEK)
Bilirubin, UA: NEGATIVE
Blood, UA: NEGATIVE
Glucose, UA: >=1000 mg/dL — AB
Ketones, POC UA: NEGATIVE mg/dL
Nitrite, UA: NEGATIVE
POC PROTEIN,UA: NEGATIVE
Spec Grav, UA: <=1.005 — AB (ref 1.010–1.025)
Urobilinogen, UA: 0.2 E.U./dL
pH, UA: 7 (ref 5.0–8.0)

## 2020-03-28 NOTE — Progress Notes (Signed)
Acute Office Visit  Subjective:    Patient ID: Tonya Chandler, female    DOB: [birth year], 60 y.o.   MRN: [id number]  Chief Complaint  Patient presents with  . Urinary Tract Infection    HPI Patient is in today for dysuria, vaginal burning and itching.   She stated Saturday she first noticed some burning with urination that has now become more constant. She is also noting some generalized burning/itching overall feeling of discomfort in her vaginal area, but denies any discharge. She is in a mutually monogamous relationship and is not interested in STI testing today. Notes she recently switched her diabetes medication to [name] and is wondering if this could be contributing to her symptoms since urinary symptoms is a side effect. States she notice some spotting when she wiped today and feel like she is raw down there.   URINARY SYMPTOMS  Dysuria: yes Urinary frequency: yes Urgency: yes Small volume voids: no Symptom severity: yes Urinary incontinence: no Foul odor: yes Hematuria: yes Abdominal pain: no Back pain: no Suprapubic pain/pressure: no Flank pain: no Fever:  no Nausea: No  Vomiting: no Diarrhea: No  Treatments attempted: none  Any relief with treatments: No  Status: worse Previous urinary tract infection: none recently; recent yeast infection about a month after starting ABX for sinus infection Recurrent urinary tract infection: no Sexual activity: mutually monogamous relationship, not concerned about STIs History of sexually transmitted disease: no Vaginal Symptoms: No discharge, some dryness, burning, itching      Past Medical History:  Diagnosis Date  . Abnormal ECG 04/01/2018  . Abnormal stress echocardiogram 05/24/2019   Formatting of this note might be different from the original. Added automatically from request for surgery [id number]  . Anemia   . Anxiety   . Atherosclerosis of native coronary artery with angina pectoris ([place]) 05/10/2015   Formatting of this  note might be different from the original. 30% lesion-Chiu  . Bilateral carpal tunnel syndrome 05/23/2017  . Bilateral knee pain 02/16/2010   Qualifier: Diagnosis of  By: Koleen Nimrod MD, Dellis Filbert    . Carpal tunnel syndrome, right 12/16/2017  . Colon polyps   . Depression   . DEPRESSION 02/16/2010   Qualifier: Diagnosis of  By: Koleen Nimrod MD, Dellis Filbert    . Depression, major, in partial remission ([place]) 07/14/2015  . Diabetes mellitus   . Diverticulosis   . Dupuytren's contracture of right hand 06/24/2017  . Essential hypertension 02/16/2010   Qualifier: Diagnosis of  By: Koleen Nimrod MD, Dellis Filbert    . Gastro-esophageal reflux   . Gastrointestinal ulcer   . Generalized anxiety disorder 03/14/2015  . GERD 02/16/2010   Qualifier: Diagnosis of  By: Koleen Nimrod MD, Dellis Filbert    . H/O: GI bleed    Michela Pitcher it was due to taking metformin  . Hyperlipidemia   . Hyperlipidemia LDL goal <70 02/16/2010   Qualifier: Diagnosis of  By: Koleen Nimrod MD, Dellis Filbert    . Hypertension   . IBS (irritable bowel syndrome)   . Impingement syndrome of shoulder, right 06/24/2017  . Midline low back pain with left-sided sciatica 12/29/2013  . Musculoskeletal chest pain 05/04/2015  . Obesity   . Precordial pain 05/04/2015  . Primary insomnia 03/14/2015  . Radiculitis of right cervical region 01/20/2018  . Radiculopathy 04/02/2018  . Right hand pain 06/24/2017  . RLS (restless legs syndrome) 07/14/2015  . Type 2 diabetes mellitus with diabetic neuropathy, with long-term current use of insulin ([place]) 02/16/2010   Qualifier: Diagnosis of  By: Koleen Nimrod MD,  Dellis Filbert      Past Surgical History:  Procedure Laterality Date  . ABDOMINAL HYSTERECTOMY    . ANTERIOR CERVICAL DECOMP/DISCECTOMY FUSION N/A 04/02/2018   Procedure: ANTERIOR CERVICAL DECOMPRESSION FUSION, CERVICAL FOUR-FIVE, CERVICAL FIVE-SIX, WITH INSTRUMENTATION AND ALLOGRAFT;  Surgeon: Phylliss Bob, MD;  Location: [facility];  Service: Orthopedics;  Laterality: N/A;  . COLONOSCOPY WITH  ESOPHAGOGASTRODUODENOSCOPY (EGD)  05/04/2009  . CORONARY ARTERY BYPASS GRAFT N/A 06/14/2019   Procedure: CORONARY ARTERY BYPASS GRAFTING (CABG) using LIMA to LAD; Endoscopic Right Greater Saphenous Vein Harvest: SVG to Circ (distal); SVG to RCA (distal).;  Surgeon: [name], MD;  Location: [facility];  Service: Open Heart Surgery;  Laterality: N/A;  . ENDOVEIN HARVEST OF GREATER SAPHENOUS VEIN Right 06/14/2019   Procedure: [place] Of Greater Saphenous Vein;  Surgeon: [name], MD;  Location: [facility];  Service: Open Heart Surgery;  Laterality: Right;  . LEFT HEART CATH AND CORONARY ANGIOGRAPHY N/A 06/11/2019   Procedure: LEFT HEART CATH AND CORONARY ANGIOGRAPHY;  Surgeon: Wellington Hampshire, MD;  Location: [facility];  Service: Cardiovascular;  Laterality: N/A;  . PIP JOINT FUSION Bilateral    pinky  . TEE WITHOUT CARDIOVERSION N/A 06/14/2019   Procedure: TRANSESOPHAGEAL ECHOCARDIOGRAM (TEE);  Surgeon: [name], MD;  Location: [facility];  Service: Open Heart Surgery;  Laterality: N/A;    Family History  Problem Relation Age of Onset  . Diabetes Mother   . Dementia Mother   . High blood pressure Mother   . Heart attack Mother   . Colon cancer Mother   . Ovarian cancer Mother   . Heart attack Father   . Diabetes Father   . Heart attack Brother   . High blood pressure Brother   . Diabetes Brother   . Epilepsy Brother   . Colon polyps Daughter 21  . Esophageal cancer Neg Hx   . Rectal cancer Neg Hx   . Stomach cancer Neg Hx   . Heart disease Neg Hx     Social History   Socioeconomic History  . Marital status: Married    Spouse name: Not on file  . Number of children: 2  . Years of education: Not on file  . Highest education level: Not on file  Occupational History  . Occupation: Tourist information centre manager: [employer]   Tobacco Use  . Smoking status: Never Smoker  . Smokeless tobacco: Never Used  Vaping Use  . Vaping Use: Never used   Substance and Sexual Activity  . Alcohol use: No  . Drug use: No  . Sexual activity: Yes    Partners: Male  Other Topics Concern  . Not on file  Social History Narrative  . Not on file   Social Determinants of Health   Financial Resource Strain: Not on file  Food Insecurity: Not on file  Transportation Needs: Not on file  Physical Activity: Not on file  Stress: Not on file  Social Connections: Not on file  Intimate Partner Violence: Not on file    Outpatient Medications Prior to Visit  Medication Sig Dispense Refill  . acetaminophen (TYLENOL) 325 MG tablet Take 2 tablets (650 mg total) by mouth every 6 (six) hours as needed for mild pain.    Marland Kitchen aspirin EC 81 MG tablet Take 81 mg by mouth daily. Swallow whole.    Marland Kitchen atorvastatin (LIPITOR) 40 MG tablet TAKE 1 TABLET(40 MG) BY MOUTH DAILY 90 tablet 3  .  Cephalexin 500 MG tablet Take 1 tablet (500 mg total) by mouth 2 (two) times daily. 14 tablet 0  . Cyanocobalamin (VITAMIN B-12) 2500 MCG SUBL Place 2,500 mcg under the tongue daily.    . cyclobenzaprine (FLEXERIL) 10 MG tablet TAKE 1 TABLET(10 MG) BY MOUTH AT BEDTIME 30 tablet 3  . dapagliflozin propanediol (FARXIGA) 10 MG TABS tablet Take 1 tablet (10 mg total) by mouth daily. 90 tablet 3  . diphenoxylate-atropine (LOMOTIL) 2.5-0.025 MG tablet One to 2 tablets by mouth 4 times a day as needed for diarrhea. 30 tablet 1  . DULoxetine (CYMBALTA) 60 MG capsule TAKE 1 CAPSULE(60 MG) BY MOUTH DAILY 90 capsule 3  . esomeprazole (NEXIUM) 20 MG capsule Take 40 mg by mouth daily at 12 noon.     Marland Kitchen estradiol (ESTRACE) 1 MG tablet Take 1 tablet (1 mg total) by mouth daily. 90 tablet 3  . Famotidine 20 MG CHEW Chew 20 mg by mouth daily as needed (reflux).     . gabapentin (NEURONTIN) 800 MG tablet TAKE 1 TABLET BY MOUTH IN THE MORNING, 1 TABLET AT MIDDAY AND 2 TABLETS AT BEDTIME 120 tablet 3  . Insulin Glargine (BASAGLAR KWIKPEN) 100 UNIT/ML Inject 50-60 Units into the skin daily. 45 mL 11  .  insulin lispro (HUMALOG KWIKPEN) 200 UNIT/ML KwikPen Inject 5 units as directed prior to lunch and dinner (Patient taking differently: Inject 10 Units into the skin See admin instructions. Inject 10 units as directed prior to lunch and dinner) 2 pen 5  . Insulin Pen Needle (PEN NEEDLES) 31G X 8 MM MISC DM Dx E11.9 Inject into skin up to 3 times daily. 100 each prn  . lidocaine (XYLOCAINE) 2 % solution Use as directed 5-10 mLs in the mouth or throat every 3 (three) hours as needed (mouth/throat pain). 100 mL 1  . lisinopril (ZESTRIL) 2.5 MG tablet TAKE 1 TABLET(2.5 MG) BY MOUTH DAILY 90 tablet 1  . loteprednol (LOTEMAX) 0.5 % ophthalmic suspension Place 1 drop into the right eye daily as needed (dry eye).     . magnesium oxide (MAG-OX) 400 MG tablet Take 800 mg by mouth at bedtime.     . metoprolol tartrate (LOPRESSOR) 25 MG tablet Take 1 tablet (25 mg total) by mouth 2 (two) times daily. 180 tablet 3  . mirtazapine (REMERON) 45 MG tablet Take 1 tablet (45 mg total) by mouth at bedtime. 90 tablet 3  . nitroGLYCERIN (NITROSTAT) 0.4 MG SL tablet Place 1 tablet (0.4 mg total) under the tongue every 5 (five) minutes as needed for chest pain. 20 tablet 1  . rOPINIRole (REQUIP) 3 MG tablet Take 1 tablet (3 mg total) by mouth at bedtime. 90 tablet 3  . TRULICITY 1.5 SW/1.0XN SOPN ADMINISTER 1.5 MG UNDER THE SKIN EVERY SUNDAY 3 mL 1   No facility-administered medications prior to visit.    Allergies  Allergen Reactions  . Adhesive [Tape] Hives    Welts--PLEASE USE HYPO ALLERGIC TAPE  . Ampicillin Diarrhea    Did it involve swelling of the face/tongue/throat, SOB, or low BP? No Did it involve sudden or severe rash/hives, skin peeling, or any reaction on the inside of your mouth or nose? No Did you need to seek medical attention at a hospital or doctor's office? No When did it last happen?30-40 years ago If all above answers are "NO", may proceed with cephalosporin use.   Marland Kitchen Cefprozil Other (See  Comments)    Acid reflux/indigestion issues  . Meloxicam  Other (See Comments)    Acid reflux/indigestion issues    Review of Systems All review of systems negative except what is listed in the HPI     Objective:    Physical Exam Vitals reviewed. Exam conducted with a chaperone present.  Constitutional:      Appearance: Normal appearance. She is normal weight.  Abdominal:     Palpations: Abdomen is soft.     Tenderness: There is no abdominal tenderness.  Genitourinary:    Comments: Vaginal redness/irritation, scant white discharge on vaginal walls.  Skin:    General: Skin is warm and dry.  Neurological:     General: No focal deficit present.     Mental Status: She is alert and oriented to person, place, and time. Mental status is at baseline.  Psychiatric:        Mood and Affect: Mood normal.        Behavior: Behavior normal.        Thought Content: Thought content normal.        Judgment: Judgment normal.     There were no vitals taken for this visit. Wt Readings from Last 3 Encounters:  02/28/20 160 lb 3.2 oz (72.7 kg)  12/30/19 160 lb 0.6 oz (72.6 kg)  12/28/19 160 lb (72.6 kg)    There are no preventive care reminders to display for this patient.  There are no preventive care reminders to display for this patient.   No results found for: TSH Lab Results  Component Value Date   WBC 6.9 09/15/2019   HGB 12.7 09/15/2019   HCT 39.9 09/15/2019   MCV 84.0 09/15/2019   PLT 303 09/15/2019   Lab Results  Component Value Date   NA 138 09/15/2019   K 4.1 09/15/2019   CO2 30 09/15/2019   GLUCOSE 173 (H) 09/15/2019   BUN 15 09/15/2019   CREATININE 0.52 09/15/2019   BILITOT 0.5 09/15/2019   ALKPHOS 57 06/14/2019   AST 14 09/15/2019   ALT 11 09/15/2019   PROT 6.4 09/15/2019   ALBUMIN 2.5 (L) 06/14/2019   CALCIUM 8.9 09/15/2019   ANIONGAP 12 06/17/2019   Lab Results  Component Value Date   CHOL 177 11/30/2019   Lab Results  Component Value Date   HDL  48 11/30/2019   Lab Results  Component Value Date   LDLCALC 64 11/30/2019   Lab Results  Component Value Date   TRIG 422 (H) 11/30/2019   Lab Results  Component Value Date   CHOLHDL 3.7 11/30/2019   Lab Results  Component Value Date   HGBA1C 8.1 (A) 12/30/2019       Assessment & Plan:   1. Dysuria Urinalysis with trace leukocytes, otherwise negative. Will send for culture. Education provided including hydration, hygiene, cotton underwear, voiding before and after intercourse. No blood on urinalysis.  - WET PREP FOR TRICH, YEAST, CLUE - POCT URINALYSIS DIP (CLINITEK) - Urine Culture  2. Vaginal irritation Pt requested vaginal exam today as described above. Amber, CMA as Producer, television/film/video. Visualization concerning for yeast infection, recommend she go ahead and take the extra diflucan she had at home from her last episode while we wait for wet prep results. Will notify her of results and any changes to plan of care. No blood visualized on exam, encouraged her to be aware of any further spotting, though her labia/perineum does appear raw and slightly excoriated which may be where the spotting with wiping came from.   - WET PREP FOR [place],  CLUE  Educated on signs and symptoms requiring urgent evaluation. Follow-up as needed.    Terrilyn Saver, NP

## 2020-03-28 NOTE — Progress Notes (Signed)
MyChart message - Your vaginal wet prep was positive for yeast. Go ahead and take the extra Diflucan you have at home. If you are not feeling better in the next 2-3 days, let me know and I will send in another one. Still waiting for urine culture - so I will let you know when that comes back. Hope you feel better soon!

## 2020-03-30 ENCOUNTER — Encounter: Payer: Self-pay | Admitting: Osteopathic Medicine

## 2020-03-30 DIAGNOSIS — B3731 Acute candidiasis of vulva and vagina: Secondary | ICD-10-CM

## 2020-03-30 DIAGNOSIS — B373 Candidiasis of vulva and vagina: Secondary | ICD-10-CM

## 2020-03-30 LAB — URINE CULTURE
MICRO NUMBER:: 11624628
Result:: NO GROWTH
SPECIMEN QUALITY:: ADEQUATE

## 2020-03-30 MED ORDER — FLUCONAZOLE 150 MG PO TABS: 150.0000 mg | ORAL_TABLET | Freq: Every day | ORAL | 1 refills | Status: DC

## 2020-03-30 NOTE — Progress Notes (Signed)
MyChart message sent - Your urine culture was negative!

## 2020-03-31 ENCOUNTER — Telehealth: Payer: Self-pay

## 2020-03-31 NOTE — Telephone Encounter (Signed)
Basaglar 100u/ml is not covered by [employer]. Per insurance, preferred formularies are":  Levemir Tyler Aas

## 2020-04-03 ENCOUNTER — Ambulatory Visit (INDEPENDENT_AMBULATORY_CARE_PROVIDER_SITE_OTHER): Payer: BC Managed Care – PPO | Admitting: Osteopathic Medicine

## 2020-04-03 ENCOUNTER — Other Ambulatory Visit: Payer: Self-pay

## 2020-04-03 ENCOUNTER — Ambulatory Visit (INDEPENDENT_AMBULATORY_CARE_PROVIDER_SITE_OTHER): Payer: BC Managed Care – PPO

## 2020-04-03 ENCOUNTER — Encounter: Payer: Self-pay | Admitting: Osteopathic Medicine

## 2020-04-03 ENCOUNTER — Other Ambulatory Visit: Payer: Self-pay | Admitting: Osteopathic Medicine

## 2020-04-03 VITALS — BP 146/98 | HR 69 | Resp 20 | Ht 60.0 in | Wt 160.0 lb

## 2020-04-03 DIAGNOSIS — M5412 Radiculopathy, cervical region: Secondary | ICD-10-CM | POA: Diagnosis not present

## 2020-04-03 DIAGNOSIS — J329 Chronic sinusitis, unspecified: Secondary | ICD-10-CM | POA: Diagnosis not present

## 2020-04-03 DIAGNOSIS — M19041 Primary osteoarthritis, right hand: Secondary | ICD-10-CM

## 2020-04-03 DIAGNOSIS — G8929 Other chronic pain: Secondary | ICD-10-CM | POA: Diagnosis not present

## 2020-04-03 DIAGNOSIS — M25712 Osteophyte, left shoulder: Secondary | ICD-10-CM | POA: Diagnosis not present

## 2020-04-03 DIAGNOSIS — IMO0002 Reserved for concepts with insufficient information to code with codable children: Secondary | ICD-10-CM

## 2020-04-03 DIAGNOSIS — E1165 Type 2 diabetes mellitus with hyperglycemia: Secondary | ICD-10-CM | POA: Diagnosis not present

## 2020-04-03 DIAGNOSIS — M25511 Pain in right shoulder: Secondary | ICD-10-CM

## 2020-04-03 DIAGNOSIS — E669 Obesity, unspecified: Secondary | ICD-10-CM

## 2020-04-03 DIAGNOSIS — F419 Anxiety disorder, unspecified: Secondary | ICD-10-CM

## 2020-04-03 DIAGNOSIS — R0981 Nasal congestion: Secondary | ICD-10-CM | POA: Diagnosis not present

## 2020-04-03 DIAGNOSIS — E1140 Type 2 diabetes mellitus with diabetic neuropathy, unspecified: Secondary | ICD-10-CM

## 2020-04-03 DIAGNOSIS — F32A Depression, unspecified: Secondary | ICD-10-CM

## 2020-04-03 DIAGNOSIS — M25512 Pain in left shoulder: Secondary | ICD-10-CM

## 2020-04-03 DIAGNOSIS — M19042 Primary osteoarthritis, left hand: Secondary | ICD-10-CM

## 2020-04-03 DIAGNOSIS — M85412 Solitary bone cyst, left shoulder: Secondary | ICD-10-CM | POA: Diagnosis not present

## 2020-04-03 LAB — POCT GLYCOSYLATED HEMOGLOBIN (HGB A1C): Hemoglobin A1C: 9.2 % — AB (ref 4.0–5.6)

## 2020-04-03 MED ORDER — TOUJEO MAX SOLOSTAR 300 UNIT/ML ~~LOC~~ SOPN
60.0000 [IU] | PEN_INJECTOR | Freq: Every day | SUBCUTANEOUS | 5 refills | Status: DC
Start: 2020-04-03 — End: 2020-06-02

## 2020-04-03 MED ORDER — NALTREXONE-BUPROPION HCL ER 8-90 MG PO TB12: 2.0000 | ORAL_TABLET | Freq: Two times a day (BID) | ORAL | 2 refills | Status: DC

## 2020-04-03 MED ORDER — CELECOXIB 100 MG PO CAPS: 100.0000 mg | ORAL_CAPSULE | Freq: Two times a day (BID) | ORAL | 0 refills | Status: DC

## 2020-04-03 MED ORDER — NALTREXONE-BUPROPION HCL ER 8-90 MG PO TB12: ORAL_TABLET | ORAL | 0 refills | Status: DC

## 2020-04-03 NOTE — Patient Instructions (Signed)
1. Type 2 diabetes, uncontrolled, with neuropathy (HCC) Toujeo insulin daily: can increase up to max 100 units, fasting Glc target 90-130 Humalog insulin at mealtimes: 5 units smaller meals, 10 units larger meals   2. Recurrent sinus infections 3. Sinus congestion Follow up with ENT Sinus CT ordered  4. Chronic right shoulder pain Xray today Follow up with Dr T   5. Osteoarthritis of both hands, unspecified osteoarthritis type Refilled Celebrex  6. Cervical radiculopathy I may be playing a role in shoulder/hand pain, will see what Dr T has to day  7. Anxiety and depression May consider adjusting Cymbalta but leave as-is for now  8. Obesity with serious comorbidity, unspecified classification, unspecified obesity type Will trial Contrave for weight loss - will probably need insurance authorization which may take a few weeks

## 2020-04-03 NOTE — Telephone Encounter (Signed)
Seen in office today, missed this note, apologies. I sent Rx for Toujeo, given high insulin requirement needs concentrated formulation. Hopefully this covered, let me know if not and will trial [place]

## 2020-04-03 NOTE — Progress Notes (Signed)
Tonya Chandler is a 60 y.o. female who presents to  [place] at [facility]  today, 04/03/20, seeking care for the following:  . Follow up DM2 - last A1C 3 mos ago was 8.1 --> today 04/03/20 9.2 o Meds  - Basal insulin 60 units daily w/ FBG >150 usually - Mealtime insulin 5 units taken a bit more consistently but not perfectly  - Trulicity 1.5 mg weekly  - Farxiga 10 mg daily  - On ASA, Statin, ACE/ARB o Other - Weight stable - BP above goal on intake 161/94 -> 146/98  HTN  - Home BP at goal 120/80 or less  L shoulder pain - ongoing >1 year, better since cervical radiculopathy was addressed but noting more pain w/ font abduction, tenderness at [facility] joint / bursa anterior shoulder   Obesity & mental health - husband has had success w/ Contrave, she's also hopeful the addition of Welbutrin might help mental heath, Cymbalta working pretty well but she still has anxiety/depression  Sinus congestion - has seen ENT, nothing on scope in office, she still has R sided congestion symptoms, pressure, feeling like "snorted ocean water" pain. No vision change, mild frontal headache, no hearing change, no dizziness or weakness.   Arthritis - requests refill of Celebrex    ASSESSMENT & PLAN with other pertinent findings:  The primary encounter diagnosis was Type 2 diabetes, uncontrolled, with neuropathy ([place]). Diagnoses of Recurrent sinus infections, Sinus congestion, Chronic right shoulder pain, Osteoarthritis of both hands, unspecified osteoarthritis type, Cervical radiculopathy, Anxiety and depression, and Obesity with serious comorbidity, unspecified classification, unspecified obesity type were also pertinent to this visit.   Patient Instructions  1. Type 2 diabetes, uncontrolled, with neuropathy (HCC) Toujeo insulin daily: can increase up to max 100 units, fasting Glc target 90-130 Humalog insulin at mealtimes: 5 units smaller meals, 10 units larger  meals   2. Recurrent sinus infections 3. Sinus congestion Follow up with ENT Sinus CT ordered  4. Chronic right shoulder pain Xray today Follow up with Dr T   5. Osteoarthritis of both hands, unspecified osteoarthritis type Refilled Celebrex  6. Cervical radiculopathy I may be playing a role in shoulder/hand pain, will see what Dr T has to day  7. Anxiety and depression May consider adjusting Cymbalta but leave as-is for now  8. Obesity with serious comorbidity, unspecified classification, unspecified obesity type Will trial Contrave for weight loss - will probably need insurance authorization which may take a few weeks       There are no Patient Instructions on file for this visit.  Orders Placed This Encounter  Procedures  . CT MAXILLOFACIAL W & WO CONTRAST  . DG Shoulder Right  . POCT glycosylated hemoglobin (Hb A1C)    Meds ordered this encounter  Medications  . insulin glargine, 2 Unit Dial, (TOUJEO MAX SOLOSTAR) 300 UNIT/ML Solostar Pen    Sig: Inject 60-100 Units into the skin daily. Start at 62 units and increase daily dose by 4 units at a time, twice per week, until fasting sugars are consistently 90-130, then continue at that dose    Dispense:  45 mL    Refill:  5  . Naltrexone-buPROPion HCl ER 8-90 MG TB12    Sig: 1 tab daily for week 1, then 1 tab BID for week 2, then 2 tab PO qAM and 1 tab PO qPM for week 3, then 2 tabs BID.    Dispense:  80 tablet  Refill:  0  . Naltrexone-buPROPion HCl ER 8-90 MG TB12    Sig: Take 2 tablets by mouth 2 (two) times daily.    Dispense:  120 tablet    Refill:  2  . celecoxib (CELEBREX) 100 MG capsule    Sig: Take 1 capsule (100 mg total) by mouth 2 (two) times daily. As needed for arthritis    Dispense:  180 capsule    Refill:  0   If prior authorization needed for Contrave: patient is in a medically monitored weight loss regimen w/ lifestyle modifications, she is taking GLP1RA Rx for diabetes, she has comorbid  illnesses which will benefit from weight reduction (CAD s/p CABG, DM2, HTN, HLD)     See below for relevant physical exam findings  See below for recent lab and imaging results reviewed  Medications, allergies, PMH, PSH, SocH, [place] reviewed below    Follow-up instructions: Return in about 3 months (around 07/04/2020) for MONITOR A1C, WEIGHT. SEE [place] SOONER AS NEEDED .SPORTS MED APPT W/ DR T RE: SHOULDER .                                        Exam:  BP (!) 146/98 (BP Location: Left Arm, Patient Position: Sitting, Cuff Size: Normal)   Pulse 69   Resp 20   Ht 5' (1.524 m)   Wt 160 lb (72.6 kg)   SpO2 97%   BMI 31.25 kg/m   Constitutional: VS see above. General Appearance: alert, well-developed, well-nourished, NAD  Neck: No masses, trachea midline.   Respiratory: Normal respiratory effort. no wheeze, no rhonchi, no rales  Cardiovascular: S1/S2 normal, no murmur, no rub/gallop auscultated. RRR.   Musculoskeletal: Gait normal. Symmetric and independent movement of all extremities. Tender to palpation anterior L shoulder, pain at [facility] joint on palpation, neg drop arm, (+)posterior apprehension, negative anterior apprehension   Neurological: Normal balance/coordination. No tremor.  Skin: warm, dry, intact.   Psychiatric: Normal judgment/insight. Normal mood and affect. Oriented x3.   Current Meds  Medication Sig  . acetaminophen (TYLENOL) 325 MG tablet Take 2 tablets (650 mg total) by mouth every 6 (six) hours as needed for mild pain.  Marland Kitchen aspirin EC 81 MG tablet Take 81 mg by mouth daily. Swallow whole.  Marland Kitchen atorvastatin (LIPITOR) 40 MG tablet TAKE 1 TABLET(40 MG) BY MOUTH DAILY  . celecoxib (CELEBREX) 100 MG capsule Take 1 capsule (100 mg total) by mouth 2 (two) times daily. As needed for arthritis  . Cyanocobalamin (VITAMIN B-12) 2500 MCG SUBL Place 2,500 mcg under the tongue daily.  . cyclobenzaprine (FLEXERIL) 10 MG tablet TAKE 1 TABLET(10  MG) BY MOUTH AT BEDTIME  . dapagliflozin propanediol (FARXIGA) 10 MG TABS tablet Take 1 tablet (10 mg total) by mouth daily.  . diphenoxylate-atropine (LOMOTIL) 2.5-0.025 MG tablet One to 2 tablets by mouth 4 times a day as needed for diarrhea.  . DULoxetine (CYMBALTA) 60 MG capsule TAKE 1 CAPSULE(60 MG) BY MOUTH DAILY  . esomeprazole (NEXIUM) 20 MG capsule Take 40 mg by mouth daily at 12 noon.   Marland Kitchen estradiol (ESTRACE) 1 MG tablet Take 1 tablet (1 mg total) by mouth daily.  . Famotidine 20 MG CHEW Chew 20 mg by mouth daily as needed (reflux).   . gabapentin (NEURONTIN) 800 MG tablet TAKE 1 TABLET BY MOUTH IN THE MORNING, 1 TABLET AT MIDDAY AND 2 TABLETS AT BEDTIME  .  insulin glargine, 2 Unit Dial, (TOUJEO MAX SOLOSTAR) 300 UNIT/ML Solostar Pen Inject 60-100 Units into the skin daily. Start at 62 units and increase daily dose by 4 units at a time, twice per week, until fasting sugars are consistently 90-130, then continue at that dose  . insulin lispro (HUMALOG KWIKPEN) 200 UNIT/ML KwikPen Inject 5 units as directed prior to lunch and dinner (Patient taking differently: Inject 10 Units into the skin See admin instructions. Inject 10 units as directed prior to lunch and dinner)  . Insulin Pen Needle (PEN NEEDLES) 31G X 8 MM MISC DM Dx E11.9 Inject into skin up to 3 times daily.  Marland Kitchen lisinopril (ZESTRIL) 2.5 MG tablet TAKE 1 TABLET(2.5 MG) BY MOUTH DAILY  . loteprednol (LOTEMAX) 0.5 % ophthalmic suspension Place 1 drop into the right eye daily as needed (dry eye).   . magnesium oxide (MAG-OX) 400 MG tablet Take 800 mg by mouth at bedtime.   . metoprolol tartrate (LOPRESSOR) 25 MG tablet Take 1 tablet (25 mg total) by mouth 2 (two) times daily.  . mirtazapine (REMERON) 45 MG tablet Take 1 tablet (45 mg total) by mouth at bedtime.  . Naltrexone-buPROPion HCl ER 8-90 MG TB12 1 tab daily for week 1, then 1 tab BID for week 2, then 2 tab PO qAM and 1 tab PO qPM for week 3, then 2 tabs BID.  .  Naltrexone-buPROPion HCl ER 8-90 MG TB12 Take 2 tablets by mouth 2 (two) times daily.  . nitroGLYCERIN (NITROSTAT) 0.4 MG SL tablet Place 1 tablet (0.4 mg total) under the tongue every 5 (five) minutes as needed for chest pain.  Marland Kitchen rOPINIRole (REQUIP) 3 MG tablet Take 1 tablet (3 mg total) by mouth at bedtime.  . TRULICITY 1.5 ZO/1.0RU SOPN ADMINISTER 1.5 MG UNDER THE SKIN EVERY SUNDAY  . [DISCONTINUED] Insulin Glargine (BASAGLAR KWIKPEN) 100 UNIT/ML Inject 50-60 Units into the skin daily.    Allergies  Allergen Reactions  . Adhesive [Tape] Hives    [place]ts--PLEASE USE HYPO ALLERGIC TAPE  . Ampicillin Diarrhea    Did it involve swelling of the face/tongue/throat, SOB, or low BP? No Did it involve sudden or severe rash/hives, skin peeling, or any reaction on the inside of your mouth or nose? No Did you need to seek medical attention at a hospital or doctor's office? No When did it last happen?30-40 years ago If all above answers are "NO", may proceed with cephalosporin use.   Marland Kitchen Cefprozil Other (See Comments)    Acid reflux/indigestion issues  . Meloxicam Other (See Comments)    Acid reflux/indigestion issues    Patient Active Problem List   Diagnosis Date Noted  . S/P CABG x 3 LIMA to LAD, SVG sequential to RCA and circumflex in May 2021   . Unstable angina ([place]) 06/11/2019  . Angina pectoris ([place]) 06/04/2019  . Abnormal stress test 05/24/2019  . Radiculopathy 04/02/2018  . Abnormal ECG 04/01/2018  . Radiculitis of right cervical region 01/20/2018  . Carpal tunnel syndrome, right 12/16/2017  . Right hand pain 06/24/2017  . Impingement syndrome of shoulder, right 06/24/2017  . Dupuytren's contracture of right hand 06/24/2017  . Bilateral carpal tunnel syndrome 05/23/2017  . Depression, major, in partial remission ([place]) 07/14/2015  . RLS (restless legs syndrome) 07/14/2015  . Atherosclerosis of native coronary artery with angina pectoris ([place]) 05/10/2015  . Musculoskeletal  chest pain 05/04/2015  . Precordial pain 05/04/2015  . Generalized anxiety disorder 03/14/2015  . Primary insomnia 03/14/2015  .  Midline low back pain with left-sided sciatica 12/29/2013  . Type 2 diabetes mellitus with diabetic neuropathy, with long-term current use of insulin ([place]) 02/16/2010  . Hyperlipidemia LDL goal <70 02/16/2010  . DEPRESSION 02/16/2010  . Essential hypertension 02/16/2010  . GERD 02/16/2010  . Bilateral knee pain 02/16/2010    Family History  Problem Relation Age of Onset  . Diabetes Mother   . Dementia Mother   . High blood pressure Mother   . Heart attack Mother   . Colon cancer Mother   . Ovarian cancer Mother   . Heart attack Father   . Diabetes Father   . Heart attack Brother   . High blood pressure Brother   . Diabetes Brother   . Epilepsy Brother   . Colon polyps Daughter 25  . Esophageal cancer Neg Hx   . Rectal cancer Neg Hx   . Stomach cancer Neg Hx   . Heart disease Neg Hx     Social History   Tobacco Use  Smoking Status Never Smoker  Smokeless Tobacco Never Used    Past Surgical History:  Procedure Laterality Date  . ABDOMINAL HYSTERECTOMY    . ANTERIOR CERVICAL DECOMP/DISCECTOMY FUSION N/A 04/02/2018   Procedure: ANTERIOR CERVICAL DECOMPRESSION FUSION, CERVICAL FOUR-FIVE, CERVICAL FIVE-SIX, WITH INSTRUMENTATION AND ALLOGRAFT;  Surgeon: Phylliss Bob, MD;  Location: [place];  Service: Orthopedics;  Laterality: N/A;  . COLONOSCOPY WITH ESOPHAGOGASTRODUODENOSCOPY (EGD)  05/04/2009  . CORONARY ARTERY BYPASS GRAFT N/A 06/14/2019   Procedure: CORONARY ARTERY BYPASS GRAFTING (CABG) using LIMA to LAD; Endoscopic Right Greater Saphenous Vein Harvest: SVG to Circ (distal); SVG to RCA (distal).;  Surgeon: [name], MD;  Location: [place];  Service: Open Heart Surgery;  Laterality: N/A;  . ENDOVEIN HARVEST OF GREATER SAPHENOUS VEIN Right 06/14/2019   Procedure: [place] Of Greater Saphenous Vein;  Surgeon: [name], MD;   Location: [place];  Service: Open Heart Surgery;  Laterality: Right;  . LEFT HEART CATH AND CORONARY ANGIOGRAPHY N/A 06/11/2019   Procedure: LEFT HEART CATH AND CORONARY ANGIOGRAPHY;  Surgeon: Wellington Hampshire, MD;  Location: [place] [facility];  Service: Cardiovascular;  Laterality: N/A;  . PIP JOINT FUSION Bilateral    pinky  . TEE WITHOUT CARDIOVERSION N/A 06/14/2019   Procedure: TRANSESOPHAGEAL ECHOCARDIOGRAM (TEE);  Surgeon: [name], MD;  Location: [place];  Service: Open Heart Surgery;  Laterality: N/A;    Immunization History  Administered Date(s) Administered  . Influenza,inj,Quad PF,6+ Mos 11/05/2014, 10/12/2017, 10/12/2017, 09/30/2018, 11/30/2019  . Influenza-Unspecified 10/22/2014, 10/13/2015  . PFIZER(Purple Top)SARS-COV-2 Vaccination 03/26/2019, 04/20/2019, 11/18/2019  . Tdap 07/22/2011  . Zoster Recombinat (Shingrix) 09/30/2018    Recent Results (from the past 2160 hour(s))  HM DIABETES EYE EXAM     Status: None   Collection Time: 01/27/20 12:00 AM  Result Value Ref Range   HM Diabetic Eye Exam No Retinopathy No Retinopathy  Novel Coronavirus, NAA ([facility])     Status: None   Collection Time: 02/01/20 10:53 AM   Specimen: Nasopharyngeal(NP) swabs in vial transport medium   Nasopharynge  Screenin  Result Value Ref Range   SARS-CoV-2, NAA Not Detected Not Detected    Comment: This nucleic acid amplification test was developed and its performance characteristics determined by [employer]. Nucleic acid amplification tests include RT-PCR and TMA. This test has not been FDA cleared or approved. This test has been authorized by FDA under an Emergency Use Authorization (EUA). This test is only authorized for the  duration of time the declaration that circumstances exist justifying the authorization of the emergency use of in vitro diagnostic tests for detection of SARS-CoV-2 virus and/or diagnosis of COVID-19 infection under section 564(b)(1) of the Act, 21  U.S.C. 268TMH-9(Q) (1), unless the authorization is terminated or revoked sooner. When diagnostic testing is negative, the possibility of a false negative result should be considered in the context of a patient's recent exposures and the presence of clinical signs and symptoms consistent with COVID-19. An individual without symptoms of COVID-19 and who is not shedding SARS-CoV-2 virus wo uld expect to have a negative (not detected) result in this assay.   SARS-COV-2, NAA 2 DAY TAT     Status: None   Collection Time: 02/01/20 10:53 AM   Nasopharynge  Screenin  Result Value Ref Range   SARS-CoV-2, NAA 2 DAY TAT Performed   Novel Coronavirus, NAA ([facility])     Status: None   Collection Time: 03/01/20 10:58 AM   Specimen: Nasopharyngeal(NP) swabs in vial transport medium   Nasopharynge  Result Value Ref Range   SARS-CoV-2, NAA Not Detected Not Detected    Comment: This nucleic acid amplification test was developed and its performance characteristics determined by [employer]. Nucleic acid amplification tests include RT-PCR and TMA. This test has not been FDA cleared or approved. This test has been authorized by FDA under an Emergency Use Authorization (EUA). This test is only authorized for the duration of time the declaration that circumstances exist justifying the authorization of the emergency use of in vitro diagnostic tests for detection of SARS-CoV-2 virus and/or diagnosis of COVID-19 infection under section 564(b)(1) of the Act, 21 U.S.C. 222LNL-8(X) (1), unless the authorization is terminated or revoked sooner. When diagnostic testing is negative, the possibility of a false negative result should be considered in the context of a patient's recent exposures and the presence of clinical signs and symptoms consistent with COVID-19. An individual without symptoms of COVID-19 and who is not shedding SARS-CoV-2 virus wo uld expect to have a negative (not detected) result  in this assay.   SARS-COV-2, NAA 2 DAY TAT     Status: None   Collection Time: 03/01/20 10:58 AM   Nasopharynge  Result Value Ref Range   SARS-CoV-2, NAA 2 DAY TAT Performed   WET PREP FOR TRICH, YEAST, CLUE     Status: Abnormal   Collection Time: 03/28/20 10:41 AM  Result Value Ref Range   MICRO NUMBER: [id number]    Specimen Quality Adequate    SOURCE: NOT GIVEN    Status FINAL    RESULT yeast (A)     Comment: Yeast present No Trichomonas vaginalis seen. No clue cells seen Epithelial Cells Present  Urine Culture     Status: None   Collection Time: 03/28/20 10:44 AM   Specimen: Urine  Result Value Ref Range   MICRO NUMBER: [id number]    SPECIMEN QUALITY: Adequate    Sample Source URINE    STATUS: FINAL    Result: No Growth   POCT URINALYSIS DIP (CLINITEK)     Status: Abnormal   Collection Time: 03/28/20 11:12 AM  Result Value Ref Range   Color, UA yellow yellow   Clarity, UA clear clear   Glucose, UA >=1,000 (A) negative mg/dL   Bilirubin, UA negative negative   Ketones, POC UA negative negative mg/dL   Spec Grav, UA <=1.005 (A) 1.010 - 1.025   Blood, UA negative negative   pH, UA 7.0 5.0 - 8.0  POC PROTEIN,UA negative negative, trace   Urobilinogen, UA 0.2 0.2 or 1.0 E.U./dL   Nitrite, UA Negative Negative   Leukocytes, UA Trace (A) Negative  POCT glycosylated hemoglobin (Hb A1C)     Status: Abnormal   Collection Time: 04/03/20 11:04 AM  Result Value Ref Range   Hemoglobin A1C 9.2 (A) 4.0 - 5.6 %   HbA1c POC (<> result, manual entry)     HbA1c, POC (prediabetic range)     HbA1c, POC (controlled diabetic range)      No results found.     All questions at time of visit were answered - patient instructed to contact office with any additional concerns or updates. ER/RTC precautions were reviewed with the patient as applicable.   Please note: manual typing as well as voice recognition software may have been used to produce this document - typos may escape review.  Please contact Dr. Sheppard Coil for any needed clarifications.   Total encounter time on date of service, 04/03/20, was 40 minutes spent addressing problems/issues as noted above in [place], including time spent in discussion with patient regarding the HPI, ROS, confirming history, reviewing Assessment & Plan, as well as time spent on coordination of care, record review.

## 2020-04-04 NOTE — Addendum Note (Signed)
Addended by: Maryla Morrow on: 04/04/2020 10:57 AM   Modules accepted: Orders

## 2020-04-04 NOTE — Addendum Note (Signed)
Addended by: Maryla Morrow on: 04/04/2020 11:03 AM   Modules accepted: Orders

## 2020-04-06 ENCOUNTER — Ambulatory Visit (INDEPENDENT_AMBULATORY_CARE_PROVIDER_SITE_OTHER): Payer: BC Managed Care – PPO

## 2020-04-06 ENCOUNTER — Other Ambulatory Visit: Payer: Self-pay

## 2020-04-06 DIAGNOSIS — J329 Chronic sinusitis, unspecified: Secondary | ICD-10-CM | POA: Diagnosis not present

## 2020-04-06 DIAGNOSIS — R0981 Nasal congestion: Secondary | ICD-10-CM | POA: Diagnosis not present

## 2020-04-06 DIAGNOSIS — J0110 Acute frontal sinusitis, unspecified: Secondary | ICD-10-CM | POA: Diagnosis not present

## 2020-04-06 DIAGNOSIS — J342 Deviated nasal septum: Secondary | ICD-10-CM | POA: Diagnosis not present

## 2020-04-06 DIAGNOSIS — J321 Chronic frontal sinusitis: Secondary | ICD-10-CM | POA: Diagnosis not present

## 2020-04-07 ENCOUNTER — Ambulatory Visit: Payer: BC Managed Care – PPO | Admitting: Sports Medicine

## 2020-04-10 ENCOUNTER — Ambulatory Visit: Payer: BC Managed Care – PPO | Admitting: Sports Medicine

## 2020-04-10 ENCOUNTER — Telehealth: Payer: Self-pay

## 2020-04-10 NOTE — Telephone Encounter (Signed)
Notification through Covermymeds that a PA was needed for patient's Celebrex 100mg  capsules.   PA completed and submitted; immediate response with approval received.

## 2020-04-12 MED ORDER — FLUCONAZOLE 150 MG PO TABS: 150.0000 mg | ORAL_TABLET | Freq: Once | ORAL | 1 refills | Status: AC

## 2020-04-12 NOTE — Addendum Note (Signed)
Addended by: Maryla Morrow on: 04/12/2020 04:35 PM   Modules accepted: Orders

## 2020-04-13 ENCOUNTER — Other Ambulatory Visit: Payer: Self-pay | Admitting: Sports Medicine

## 2020-04-14 DIAGNOSIS — M67912 Unspecified disorder of synovium and tendon, left shoulder: Secondary | ICD-10-CM | POA: Diagnosis not present

## 2020-05-08 ENCOUNTER — Other Ambulatory Visit: Payer: Self-pay | Admitting: Orthopedic Surgery

## 2020-05-08 ENCOUNTER — Encounter: Payer: Self-pay | Admitting: Osteopathic Medicine

## 2020-05-08 DIAGNOSIS — M67912 Unspecified disorder of synovium and tendon, left shoulder: Secondary | ICD-10-CM

## 2020-05-10 ENCOUNTER — Ambulatory Visit (INDEPENDENT_AMBULATORY_CARE_PROVIDER_SITE_OTHER): Payer: BC Managed Care – PPO | Admitting: Medical-Surgical

## 2020-05-10 ENCOUNTER — Other Ambulatory Visit: Payer: Self-pay

## 2020-05-10 ENCOUNTER — Encounter: Payer: Self-pay | Admitting: Medical-Surgical

## 2020-05-10 VITALS — BP 152/80 | HR 69 | Temp 97.9°F | Ht 61.0 in | Wt 164.1 lb

## 2020-05-10 DIAGNOSIS — M19041 Primary osteoarthritis, right hand: Secondary | ICD-10-CM | POA: Diagnosis not present

## 2020-05-10 DIAGNOSIS — N898 Other specified noninflammatory disorders of vagina: Secondary | ICD-10-CM

## 2020-05-10 DIAGNOSIS — B373 Candidiasis of vulva and vagina: Secondary | ICD-10-CM | POA: Diagnosis not present

## 2020-05-10 DIAGNOSIS — M19042 Primary osteoarthritis, left hand: Secondary | ICD-10-CM

## 2020-05-10 LAB — WET PREP FOR TRICH, YEAST, CLUE
MICRO NUMBER:: 11792183
Specimen Quality: ADEQUATE

## 2020-05-10 MED ORDER — FLUCONAZOLE 150 MG PO TABS: 150.0000 mg | ORAL_TABLET | Freq: Once | ORAL | 1 refills | Status: AC

## 2020-05-10 MED ORDER — CELECOXIB 100 MG PO CAPS: 100.0000 mg | ORAL_CAPSULE | Freq: Two times a day (BID) | ORAL | 0 refills | Status: DC

## 2020-05-10 NOTE — Progress Notes (Signed)
Subjective:    CC: yeast infection?  HPI: Pleasant 60 year old female presenting for symptoms consistent with a yeast infection.  Notes this is her third occurrence this year where she has experienced significant vaginal pain and irritation.  Notes that it is painful to wipe after toileting.  She has noticed a mild vaginal odor but no discharge.  No dysuria or abnormal vaginal bleeding.  The previous 2 occurrences, she took Diflucan which resolved her symptoms.  Wonders if this is coming from taking [name] for her diabetes.  Notes that she has not been able to take her Celebrex because her insurance stopped covering it. Uses this for arthritis and has been struggling without the medication.   I reviewed the past medical history, family history, social history, surgical history, and allergies today and no changes were needed.  Please see the problem list section below in epic for further details.  Past Medical History: Past Medical History:  Diagnosis Date  . Abnormal ECG 04/01/2018  . Abnormal stress echocardiogram 05/24/2019   Formatting of this note might be different from the original. Added automatically from request for surgery [id number]  . Anemia   . Anxiety   . Atherosclerosis of native coronary artery with angina pectoris ([place]) 05/10/2015   Formatting of this note might be different from the original. 30% lesion-Chiu  . Bilateral carpal tunnel syndrome 05/23/2017  . Bilateral knee pain 02/16/2010   Qualifier: Diagnosis of  By: Koleen Nimrod MD, Dellis Filbert    . Carpal tunnel syndrome, right 12/16/2017  . Colon polyps   . Depression   . DEPRESSION 02/16/2010   Qualifier: Diagnosis of  By: Koleen Nimrod MD, Dellis Filbert    . Depression, major, in partial remission ([place]) 07/14/2015  . Diabetes mellitus   . Diverticulosis   . Dupuytren's contracture of right hand 06/24/2017  . Essential hypertension 02/16/2010   Qualifier: Diagnosis of  By: Koleen Nimrod MD, Dellis Filbert    . Gastro-esophageal reflux   .  Gastrointestinal ulcer   . Generalized anxiety disorder 03/14/2015  . GERD 02/16/2010   Qualifier: Diagnosis of  By: Koleen Nimrod MD, Dellis Filbert    . H/O: GI bleed    Michela Pitcher it was due to taking metformin  . Hyperlipidemia   . Hyperlipidemia LDL goal <70 02/16/2010   Qualifier: Diagnosis of  By: Koleen Nimrod MD, Dellis Filbert    . Hypertension   . IBS (irritable bowel syndrome)   . Impingement syndrome of shoulder, right 06/24/2017  . Midline low back pain with left-sided sciatica 12/29/2013  . Musculoskeletal chest pain 05/04/2015  . Obesity   . Precordial pain 05/04/2015  . Primary insomnia 03/14/2015  . Radiculitis of right cervical region 01/20/2018  . Radiculopathy 04/02/2018  . Right hand pain 06/24/2017  . RLS (restless legs syndrome) 07/14/2015  . Type 2 diabetes mellitus with diabetic neuropathy, with long-term current use of insulin ([place]) 02/16/2010   Qualifier: Diagnosis of  By: Koleen Nimrod MD, Dellis Filbert     Past Surgical History: Past Surgical History:  Procedure Laterality Date  . ABDOMINAL HYSTERECTOMY    . ANTERIOR CERVICAL DECOMP/DISCECTOMY FUSION N/A 04/02/2018   Procedure: ANTERIOR CERVICAL DECOMPRESSION FUSION, CERVICAL FOUR-FIVE, CERVICAL FIVE-SIX, WITH INSTRUMENTATION AND ALLOGRAFT;  Surgeon: Phylliss Bob, MD;  Location: [facility];  Service: Orthopedics;  Laterality: N/A;  . COLONOSCOPY WITH ESOPHAGOGASTRODUODENOSCOPY (EGD)  05/04/2009  . CORONARY ARTERY BYPASS GRAFT N/A 06/14/2019   Procedure: CORONARY ARTERY BYPASS GRAFTING (CABG) using LIMA to LAD; Endoscopic Right Greater Saphenous Vein Harvest: SVG to Circ (distal); SVG to RCA (distal).;  Surgeon: [name], MD;  Location: [facility];  Service: Open Heart Surgery;  Laterality: N/A;  . ENDOVEIN HARVEST OF GREATER SAPHENOUS VEIN Right 06/14/2019   Procedure: [place]opes Of Greater Saphenous Vein;  Surgeon: [name], MD;  Location: [facility];  Service: Open Heart Surgery;  Laterality: Right;  . LEFT HEART CATH AND CORONARY  ANGIOGRAPHY N/A 06/11/2019   Procedure: LEFT HEART CATH AND CORONARY ANGIOGRAPHY;  Surgeon: Wellington Hampshire, MD;  Location: [facility];  Service: Cardiovascular;  Laterality: N/A;  . PIP JOINT FUSION Bilateral    pinky  . TEE WITHOUT CARDIOVERSION N/A 06/14/2019   Procedure: TRANSESOPHAGEAL ECHOCARDIOGRAM (TEE);  Surgeon: [name], MD;  Location: [facility];  Service: Open Heart Surgery;  Laterality: N/A;   Social History: Social History   Socioeconomic History  . Marital status: Married    Spouse name: Not on file  . Number of children: 2  . Years of education: Not on file  . Highest education level: Not on file  Occupational History  . Occupation: Tourist information centre manager: [employer]   Tobacco Use  . Smoking status: Never Smoker  . Smokeless tobacco: Never Used  Vaping Use  . Vaping Use: Never used  Substance and Sexual Activity  . Alcohol use: No  . Drug use: No  . Sexual activity: Yes    Partners: Male  Other Topics Concern  . Not on file  Social History Narrative  . Not on file   Social Determinants of Health   Financial Resource Strain: Not on file  Food Insecurity: Not on file  Transportation Needs: Not on file  Physical Activity: Not on file  Stress: Not on file  Social Connections: Not on file   Family History: Family History  Problem Relation Age of Onset  . Diabetes Mother   . Dementia Mother   . High blood pressure Mother   . Heart attack Mother   . Colon cancer Mother   . Ovarian cancer Mother   . Heart attack Father   . Diabetes Father   . Heart attack Brother   . High blood pressure Brother   . Diabetes Brother   . Epilepsy Brother   . Colon polyps Daughter 4  . Esophageal cancer Neg Hx   . Rectal cancer Neg Hx   . Stomach cancer Neg Hx   . Heart disease Neg Hx    Allergies: Allergies  Allergen Reactions  . Adhesive [Tape] Hives    Welts--PLEASE USE HYPO ALLERGIC TAPE  . Ampicillin Diarrhea    Did it involve  swelling of the face/tongue/throat, SOB, or low BP? No Did it involve sudden or severe rash/hives, skin peeling, or any reaction on the inside of your mouth or nose? No Did you need to seek medical attention at a hospital or doctor's office? No When did it last happen?30-40 years ago If all above answers are "NO", may proceed with cephalosporin use.   Marland Kitchen Cefprozil Other (See Comments)    Acid reflux/indigestion issues  . Meloxicam Other (See Comments)    Acid reflux/indigestion issues   Medications: See med rec.  Review of Systems: See HPI for pertinent positives and negatives.   Objective:    General: Well Developed, well nourished, and in no acute distress.  Neuro: Alert and oriented x3.  HEENT: Normocephalic, atraumatic.  Skin: Warm and dry. Cardiac: Regular rate and rhythm, no murmurs rubs or gallops, no lower extremity edema.  Respiratory: Clear to auscultation bilaterally. Not using accessory muscles, speaking in full sentences.  Impression and Recommendations:    1. Vaginal irritation Wet prep self swab collected for evaluation.  Recurrent yeast infections are possibly related to poor control of diabetes but could also be related to the [name].  Sending in Diflucan 150 mg x 1.  If symptoms do not resolve or if they recur, may need to go ahead with a sure swab for vaginitis to get speciation information and better target treatment. - WET PREP FOR Tonya Chandler, YEAST, CLUE  2. Osteoarthritis of both hands, unspecified osteoarthritis type After checking good Rx, it looks like Celebrex is easily affordable at [facility] so sending her refill in there.  Return if symptoms worsen or fail to improve. ___________________________________________ Clearnce Sorrel, DNP, APRN, FNP-BC [facility] [place]

## 2020-05-15 ENCOUNTER — Ambulatory Visit: Payer: BC Managed Care – PPO | Admitting: Cardiology

## 2020-05-17 DIAGNOSIS — F32A Depression, unspecified: Secondary | ICD-10-CM | POA: Insufficient documentation

## 2020-05-17 DIAGNOSIS — K635 Polyp of colon: Secondary | ICD-10-CM | POA: Insufficient documentation

## 2020-05-17 DIAGNOSIS — K219 Gastro-esophageal reflux disease without esophagitis: Secondary | ICD-10-CM | POA: Insufficient documentation

## 2020-05-17 DIAGNOSIS — E785 Hyperlipidemia, unspecified: Secondary | ICD-10-CM | POA: Insufficient documentation

## 2020-05-17 DIAGNOSIS — K5790 Diverticulosis of intestine, part unspecified, without perforation or abscess without bleeding: Secondary | ICD-10-CM | POA: Insufficient documentation

## 2020-05-17 DIAGNOSIS — I10 Essential (primary) hypertension: Secondary | ICD-10-CM | POA: Insufficient documentation

## 2020-05-17 DIAGNOSIS — F419 Anxiety disorder, unspecified: Secondary | ICD-10-CM | POA: Insufficient documentation

## 2020-05-17 DIAGNOSIS — Z8719 Personal history of other diseases of the digestive system: Secondary | ICD-10-CM | POA: Insufficient documentation

## 2020-05-17 DIAGNOSIS — K289 Gastrojejunal ulcer, unspecified as acute or chronic, without hemorrhage or perforation: Secondary | ICD-10-CM | POA: Insufficient documentation

## 2020-05-17 DIAGNOSIS — K589 Irritable bowel syndrome without diarrhea: Secondary | ICD-10-CM | POA: Insufficient documentation

## 2020-05-17 DIAGNOSIS — D649 Anemia, unspecified: Secondary | ICD-10-CM | POA: Insufficient documentation

## 2020-05-17 DIAGNOSIS — E669 Obesity, unspecified: Secondary | ICD-10-CM | POA: Insufficient documentation

## 2020-05-19 ENCOUNTER — Encounter: Payer: Self-pay | Admitting: Cardiology

## 2020-05-19 ENCOUNTER — Other Ambulatory Visit: Payer: Self-pay | Admitting: Cardiology

## 2020-05-19 ENCOUNTER — Ambulatory Visit (INDEPENDENT_AMBULATORY_CARE_PROVIDER_SITE_OTHER): Payer: BC Managed Care – PPO | Admitting: Cardiology

## 2020-05-19 ENCOUNTER — Other Ambulatory Visit: Payer: Self-pay

## 2020-05-19 VITALS — BP 168/98 | HR 83 | Ht 60.0 in | Wt 162.0 lb

## 2020-05-19 DIAGNOSIS — Z951 Presence of aortocoronary bypass graft: Secondary | ICD-10-CM

## 2020-05-19 DIAGNOSIS — I10 Essential (primary) hypertension: Secondary | ICD-10-CM | POA: Diagnosis not present

## 2020-05-19 DIAGNOSIS — Z79899 Other long term (current) drug therapy: Secondary | ICD-10-CM

## 2020-05-19 DIAGNOSIS — E782 Mixed hyperlipidemia: Secondary | ICD-10-CM

## 2020-05-19 MED ORDER — LISINOPRIL 5 MG PO TABS: 5.0000 mg | ORAL_TABLET | Freq: Every day | ORAL | 1 refills | Status: DC

## 2020-05-19 NOTE — Patient Instructions (Signed)
Medication Instructions:  Your physician has recommended you make the following change in your medication:   INCREASE: lisinopril to 5 mg daily   *If you need a refill on your cardiac medications before your next appointment, please call your pharmacy*   Lab Work: Your physician recommends that you return for lab work on Monday: bmp  If you have labs (blood work) drawn today and your tests are completely normal, you will receive your results only by: Marland Kitchen MyChart Message (if you have MyChart) OR . A paper copy in the mail If you have any lab test that is abnormal or we need to change your treatment, we will call you to review the results.   Testing/Procedures: None   Follow-Up: At [facility], you and your health needs are our priority.  As part of our continuing mission to provide you with exceptional heart care, we have created designated Provider Care Teams.  These Care Teams include your primary Cardiologist (physician) and Advanced Practice Providers (APPs -  Physician Assistants and Nurse Practitioners) who all work together to provide you with the care you need, when you need it.  We recommend signing up for the patient portal called "MyChart".  Sign up information is provided on this After Visit Summary.  MyChart is used to connect with patients for Virtual Visits (Telemedicine).  Patients are able to view lab/test results, encounter notes, upcoming appointments, etc.  Non-urgent messages can be sent to your provider as well.   To learn more about what you can do with MyChart, go to [email or web address].    Your next appointment:   3 week(s)  The format for your next appointment:   In Person  Provider:   Jenne Campus, MD   Other Instructions

## 2020-05-19 NOTE — Telephone Encounter (Signed)
Refill sent to pharmacy.   

## 2020-05-19 NOTE — Progress Notes (Signed)
Cardiology Office Note:    Date:  05/19/2020   ID:  Tonya Chandler, DOB May 14, 1960, MRN [id number]  PCP:  Emeterio Reeve, DO  Cardiologist:  Jenne Campus, MD    Referring MD: Emeterio Reeve, DO   Chief complaint I am doing fine but I did have some chest pain  History of Present Illness:    Tonya Chandler is a 60 y.o. female with past medical history significant for coronary artery disease, in May 2021 she ended up having bypass surgery with LIMA to LAD, SVG to RCA, SVG to circumflex.  She also does have diabetes as well as a dyslipidemia.  Overall she seems to be doing well but she does have a problem with her blood pressure is still not being well controlled as well as some atypical chest pain happen at different situations.  Usually not with exercise.  Usually with stressful situations  Past Medical History:  Diagnosis Date  . Abnormal ECG 04/01/2018  . Abnormal stress echocardiogram 05/24/2019   Formatting of this note might be different from the original. Added automatically from request for surgery [id number]  . Abnormal stress test 05/24/2019   Formatting of this note might be different from the original. Added automatically from request for surgery [id number]  . Anemia   . Angina pectoris ([place]) 06/04/2019  . Anxiety   . Atherosclerosis of native coronary artery with angina pectoris ([place]) 05/10/2015   Formatting of this note might be different from the original. 30% lesion-Chiu  . Bilateral carpal tunnel syndrome 05/23/2017  . Bilateral knee pain 02/16/2010   Qualifier: Diagnosis of  By: Koleen Nimrod MD, Dellis Filbert    . Carpal tunnel syndrome, right 12/16/2017  . Colon polyps   . Depression   . DEPRESSION 02/16/2010   Qualifier: Diagnosis of  By: Koleen Nimrod MD, Dellis Filbert    . Depression, major, in partial remission ([place]) 07/14/2015  . Diabetes mellitus   . Diverticulosis   . Dupuytren's contracture of right hand 06/24/2017  . Essential hypertension 02/16/2010   Qualifier: Diagnosis of  By: Koleen Nimrod  MD, Dellis Filbert    . Gastro-esophageal reflux   . Gastrointestinal ulcer   . Generalized anxiety disorder 03/14/2015  . GERD 02/16/2010   Qualifier: Diagnosis of  By: Koleen Nimrod MD, Dellis Filbert    . H/O: GI bleed    Michela Pitcher it was due to taking metformin  . Hyperlipidemia   . Hyperlipidemia LDL goal <70 02/16/2010   Qualifier: Diagnosis of  By: Koleen Nimrod MD, Dellis Filbert    . Hypertension   . IBS (irritable bowel syndrome)   . Impingement syndrome of shoulder, right 06/24/2017  . Midline low back pain with left-sided sciatica 12/29/2013  . Musculoskeletal chest pain 05/04/2015  . Obesity   . Post-nasal drainage 02/10/2020   Last Assessment & Plan:  Formatting of this note might be different from the original. Concern over postnasal drainage. Throat awareness symptoms ever since she was packed in the right nostril last summer.  Was treated with antibiotics with some improvement.  Causes intermittent hoarseness and loss of voice.  Non-smoker. EXAM by anterior rhinoscopy shows no obvious polyps or purulence or obstructi  . Precordial pain 05/04/2015  . Primary insomnia 03/14/2015  . Radiculitis of right cervical region 01/20/2018  . Radiculopathy 04/02/2018  . Right hand pain 06/24/2017  . RLS (restless legs syndrome) 07/14/2015  . Type 2 diabetes mellitus with diabetic neuropathy, with long-term current use of insulin ([place]) 02/16/2010   Qualifier: Diagnosis of  By: Koleen Nimrod MD, Dellis Filbert    .  Unstable angina ([place]) 06/11/2019    Past Surgical History:  Procedure Laterality Date  . ABDOMINAL HYSTERECTOMY    . ANTERIOR CERVICAL DECOMP/DISCECTOMY FUSION N/A 04/02/2018   Procedure: ANTERIOR CERVICAL DECOMPRESSION FUSION, CERVICAL FOUR-FIVE, CERVICAL FIVE-SIX, WITH INSTRUMENTATION AND ALLOGRAFT;  Surgeon: Phylliss Bob, MD;  Location: [facility];  Service: Orthopedics;  Laterality: N/A;  . COLONOSCOPY WITH ESOPHAGOGASTRODUODENOSCOPY (EGD)  05/04/2009  . CORONARY ARTERY BYPASS GRAFT N/A 06/14/2019   Procedure: CORONARY ARTERY  BYPASS GRAFTING (CABG) using LIMA to LAD; Endoscopic Right Greater Saphenous Vein Harvest: SVG to Circ (distal); SVG to RCA (distal).;  Surgeon: [name], MD;  Location: [facility];  Service: Open Heart Surgery;  Laterality: N/A;  . ENDOVEIN HARVEST OF GREATER SAPHENOUS VEIN Right 06/14/2019   Procedure: [place]opes Of Greater Saphenous Vein;  Surgeon: [name], MD;  Location: [facility];  Service: Open Heart Surgery;  Laterality: Right;  . LEFT HEART CATH AND CORONARY ANGIOGRAPHY N/A 06/11/2019   Procedure: LEFT HEART CATH AND CORONARY ANGIOGRAPHY;  Surgeon: Wellington Hampshire, MD;  Location: [facility];  Service: Cardiovascular;  Laterality: N/A;  . PIP JOINT FUSION Bilateral    pinky  . TEE WITHOUT CARDIOVERSION N/A 06/14/2019   Procedure: TRANSESOPHAGEAL ECHOCARDIOGRAM (TEE);  Surgeon: [name], MD;  Location: [facility];  Service: Open Heart Surgery;  Laterality: N/A;    Current Medications: Current Meds  Medication Sig  . acetaminophen (TYLENOL) 325 MG tablet Take 2 tablets (650 mg total) by mouth every 6 (six) hours as needed for mild pain.  Marland Kitchen aspirin EC 81 MG tablet Take 81 mg by mouth daily. Swallow whole.  Marland Kitchen atorvastatin (LIPITOR) 40 MG tablet TAKE 1 TABLET(40 MG) BY MOUTH DAILY (Patient taking differently: Take 40 mg by mouth daily.)  . celecoxib (CELEBREX) 100 MG capsule Take 1 capsule (100 mg total) by mouth 2 (two) times daily. As needed for arthritis  . Cyanocobalamin (VITAMIN B-12) 2500 MCG SUBL Place 2,500 mcg under the tongue daily.  . cyclobenzaprine (FLEXERIL) 10 MG tablet TAKE 1 TABLET(10 MG) BY MOUTH AT BEDTIME (Patient taking differently: Take 10 mg by mouth at bedtime.)  . dapagliflozin propanediol (FARXIGA) 10 MG TABS tablet Take 1 tablet (10 mg total) by mouth daily.  . diphenoxylate-atropine (LOMOTIL) 2.5-0.025 MG tablet One to 2 tablets by mouth 4 times a day as needed for diarrhea. (Patient taking differently: Take 2 tablets by mouth 4 (four)  times daily as needed for diarrhea or loose stools. One to 2 tablets by mouth 4 times a day as needed for diarrhea.)  . DULoxetine (CYMBALTA) 60 MG capsule TAKE 1 CAPSULE(60 MG) BY MOUTH DAILY (Patient taking differently: Take 60 mg by mouth daily.)  . estradiol (ESTRACE) 1 MG tablet Take 1 tablet (1 mg total) by mouth daily.  . Famotidine 20 MG CHEW Chew 20 mg by mouth daily as needed (reflux).   . gabapentin (NEURONTIN) 800 MG tablet TAKE 1 TABLET BY MOUTH IN THE MORNING, 1 TABLET AT MIDDAY AND 2 TABLETS AT BEDTIME (Patient taking differently: Take 800 mg by mouth 4 (four) times daily. TAKE 1 TABLET BY MOUTH IN THE MORNING, 1 TABLET AT MIDDAY AND 2 TABLETS AT BEDTIME)  . insulin glargine, 2 Unit Dial, (TOUJEO MAX SOLOSTAR) 300 UNIT/ML Solostar Pen Inject 60-100 Units into the skin daily. Start at 62 units and increase daily dose by 4 units at a time, twice per week, until fasting sugars are consistently 90-130, then continue at that dose  .  insulin lispro (HUMALOG KWIKPEN) 200 UNIT/ML KwikPen Inject 5 units as directed prior to lunch and dinner (Patient taking differently: Inject 10 Units into the skin See admin instructions. Inject 10 units as directed prior to lunch and dinner)  . loteprednol (LOTEMAX) 0.5 % ophthalmic suspension Place 1 drop into the right eye daily as needed (dry eye).   . magnesium oxide (MAG-OX) 400 MG tablet Take 800 mg by mouth at bedtime.   . metoprolol tartrate (LOPRESSOR) 25 MG tablet TAKE 1 TABLET(25 MG) BY MOUTH TWICE DAILY (Patient taking differently: Take 25 mg by mouth 2 (two) times daily.)  . mirtazapine (REMERON) 45 MG tablet Take 1 tablet (45 mg total) by mouth at bedtime.  . nitroGLYCERIN (NITROSTAT) 0.4 MG SL tablet Place 1 tablet (0.4 mg total) under the tongue every 5 (five) minutes as needed for chest pain.  Marland Kitchen omeprazole (PRILOSEC) 40 MG capsule Take 40 mg by mouth daily.  Marland Kitchen rOPINIRole (REQUIP) 3 MG tablet Take 1 tablet (3 mg total) by mouth at bedtime.  .  TRULICITY 1.5 WU/9.8JX SOPN ADMINISTER 1.5 MG UNDER THE SKIN EVERY SUNDAY (Patient taking differently: Inject 1.5 mg into the skin every 7 (seven) days. Sunday's)  . [DISCONTINUED] esomeprazole (NEXIUM) 20 MG capsule Take 40 mg by mouth daily at 12 noon.   . [DISCONTINUED] lisinopril (ZESTRIL) 2.5 MG tablet TAKE 1 TABLET(2.5 MG) BY MOUTH DAILY (Patient taking differently: Take 2.5 mg by mouth daily.)  . [DISCONTINUED] omeprazole (PRILOSEC) 40 MG capsule Take 1 capsule by mouth 2 (two) times daily.     Allergies:   Adhesive [tape], Ampicillin, Cefprozil, and Meloxicam   Social History   Socioeconomic History  . Marital status: Married    Spouse name: Not on file  . Number of children: 2  . Years of education: Not on file  . Highest education level: Not on file  Occupational History  . Occupation: Tourist information centre manager: [employer]   Tobacco Use  . Smoking status: Never Smoker  . Smokeless tobacco: Never Used  Vaping Use  . Vaping Use: Never used  Substance and Sexual Activity  . Alcohol use: No  . Drug use: No  . Sexual activity: Yes    Partners: Male  Other Topics Concern  . Not on file  Social History Narrative  . Not on file   Social Determinants of Health   Financial Resource Strain: Not on file  Food Insecurity: Not on file  Transportation Needs: Not on file  Physical Activity: Not on file  Stress: Not on file  Social Connections: Not on file     Family History: [name] family history includes Colon cancer in her mother; Colon polyps (age of onset: 81) in her daughter; Dementia in her mother; Diabetes in her brother, father, and mother; Epilepsy in her brother; Heart attack in her brother, father, and mother; High blood pressure in her brother and mother; Ovarian cancer in her mother. There is no history of Esophageal cancer, Rectal cancer, Stomach cancer, or Heart disease. ROS:   Please see the history of present illness.    All 14 point review of  systems negative except as described per history of present illness  EKGs/Labs/Other Studies Reviewed:      Recent Labs: 06/15/2019: Magnesium 1.7 09/15/2019: ALT 11; BUN 15; Creat 0.52; Hemoglobin 12.7; Platelets 303; Potassium 4.1; Sodium 138  Recent Lipid Panel    Component Value Date/Time   CHOL 177 11/30/2019 1600   TRIG 422 (  H) 11/30/2019 1600   HDL 48 11/30/2019 1600   CHOLHDL 3.7 11/30/2019 1600   CHOLHDL 3.7 09/30/2018 1218   LDLCALC 64 11/30/2019 1600   LDLCALC  09/30/2018 1218     Comment:     . LDL cholesterol not calculated. Triglyceride levels greater than 400 mg/dL invalidate calculated LDL results. . Reference range: <100 . Desirable range <100 mg/dL for primary prevention;   <70 mg/dL for patients with CHD or diabetic patients  with > or = 2 CHD risk factors. Marland Kitchen LDL-C is now calculated using the Martin-Hopkins  calculation, which is a validated novel method providing  better accuracy than the Friedewald equation in the  estimation of LDL-C.  Cresenciano Genre et al. Annamaria Helling. 1478;295(62): 2061-2068  (http://education.[facility].com/faq/FAQ164)     Physical Exam:    VS:  BP (!) 168/98 (BP Location: Right Arm, Patient Position: Sitting)   Pulse 83   Ht 5' (1.524 m)   Wt 162 lb (73.5 kg)   SpO2 93%   BMI 31.64 kg/m     Wt Readings from Last 3 Encounters:  05/19/20 162 lb (73.5 kg)  05/10/20 164 lb 1.9 oz (74.4 kg)  04/03/20 160 lb (72.6 kg)     GEN:  Well nourished, well developed in no acute distress HEENT: Normal NECK: No JVD; No carotid bruits LYMPHATICS: No lymphadenopathy CARDIAC: RRR, no murmurs, no rubs, no gallops RESPIRATORY:  Clear to auscultation without rales, wheezing or rhonchi  ABDOMEN: Soft, non-tender, non-distended MUSCULOSKELETAL:  No edema; No deformity  SKIN: Warm and dry LOWER EXTREMITIES: no swelling NEUROLOGIC:  Alert and oriented x 3 PSYCHIATRIC:  Normal affect   ASSESSMENT:    1. Essential hypertension   2.  Medication management   3. S/P CABG x 3 LIMA to LAD, SVG sequential to RCA and circumflex in May 2021   4. Mixed hyperlipidemia    PLAN:    In order of problems listed above:  1. Essential hypertension uncontrolled plan is to increase dose of lisinopril to 5 mg daily recheck Chem-7 next Monday. I will see her back in 3 weeks to check BP 2. CAD - has some CP with some concerning characteristics, will control BP if still symptomatic  3. Dyslipidemia, on statin, will continue   Medication Adjustments/Labs and Tests Ordered: Current medicines are reviewed at length with the patient today.  Concerns regarding medicines are outlined above.  Orders Placed This Encounter  Procedures  . Basic metabolic panel  . EKG 12-Lead   Medication changes:  Meds ordered this encounter  Medications  . lisinopril (ZESTRIL) 5 MG tablet    Sig: Take 1 tablet (5 mg total) by mouth daily.    Dispense:  90 tablet    Refill:  1    Signed, Park Liter, MD, [facility] 05/19/2020 4:07 PM    [facility]

## 2020-05-20 ENCOUNTER — Ambulatory Visit
Admission: RE | Admit: 2020-05-20 | Discharge: 2020-05-20 | Disposition: A | Discharge status: Home / Self Care | Payer: BC Managed Care – PPO | Source: Ambulatory Visit | Attending: Orthopedic Surgery | Admitting: Orthopedic Surgery

## 2020-05-20 DIAGNOSIS — G8929 Other chronic pain: Secondary | ICD-10-CM | POA: Diagnosis not present

## 2020-05-20 DIAGNOSIS — M19012 Primary osteoarthritis, left shoulder: Secondary | ICD-10-CM | POA: Diagnosis not present

## 2020-05-20 DIAGNOSIS — M75102 Unspecified rotator cuff tear or rupture of left shoulder, not specified as traumatic: Secondary | ICD-10-CM | POA: Diagnosis not present

## 2020-05-20 DIAGNOSIS — M67912 Unspecified disorder of synovium and tendon, left shoulder: Secondary | ICD-10-CM

## 2020-05-23 DIAGNOSIS — I10 Essential (primary) hypertension: Secondary | ICD-10-CM | POA: Diagnosis not present

## 2020-05-23 DIAGNOSIS — Z79899 Other long term (current) drug therapy: Secondary | ICD-10-CM | POA: Diagnosis not present

## 2020-05-24 LAB — BASIC METABOLIC PANEL
BUN/Creatinine Ratio: 15 (ref 12–28)
BUN: 9 mg/dL (ref 8–27)
CO2: 27 mmol/L (ref 20–29)
Calcium: 9.3 mg/dL (ref 8.7–10.3)
Chloride: 99 mmol/L (ref 96–106)
Creatinine, Ser: 0.61 mg/dL (ref 0.57–1.00)
Glucose: 134 mg/dL — ABNORMAL HIGH (ref 65–99)
Potassium: 4.6 mmol/L (ref 3.5–5.2)
Sodium: 141 mmol/L (ref 134–144)
eGFR: 102 mL/min/{1.73_m2} (ref >59)

## 2020-05-29 DIAGNOSIS — M75122 Complete rotator cuff tear or rupture of left shoulder, not specified as traumatic: Secondary | ICD-10-CM | POA: Diagnosis not present

## 2020-05-31 ENCOUNTER — Encounter: Payer: Self-pay | Admitting: Osteopathic Medicine

## 2020-05-31 NOTE — Telephone Encounter (Signed)
Pt called again requesting to repeat her A1c testing prior to her surgical procedure. She is okay with a Nurse visit or [facility] labs to complete testing. Pls advise, thanks.

## 2020-06-01 ENCOUNTER — Other Ambulatory Visit: Payer: Self-pay

## 2020-06-01 DIAGNOSIS — E1140 Type 2 diabetes mellitus with diabetic neuropathy, unspecified: Secondary | ICD-10-CM

## 2020-06-01 DIAGNOSIS — IMO0002 Reserved for concepts with insufficient information to code with codable children: Secondary | ICD-10-CM

## 2020-06-01 DIAGNOSIS — E1165 Type 2 diabetes mellitus with hyperglycemia: Secondary | ICD-10-CM | POA: Diagnosis not present

## 2020-06-01 NOTE — Telephone Encounter (Signed)
Please schedule nurse visit for POC test or can place order for [facility] In the future, surgeons can feel free to order their own pre-op labs .Marland KitchenMarland KitchenMarland Kitchen

## 2020-06-02 ENCOUNTER — Other Ambulatory Visit: Payer: Self-pay

## 2020-06-02 ENCOUNTER — Telehealth: Payer: Self-pay

## 2020-06-02 LAB — HEMOGLOBIN A1C
Hgb A1c MFr Bld: 8.9 % of total Hgb — ABNORMAL HIGH (ref <5.7)
Mean Plasma Glucose: 209 mg/dL
eAG (mmol/L): 11.6 mmol/L

## 2020-06-02 MED ORDER — INSULIN GLARGINE-YFGN 100 UNIT/ML ~~LOC~~ SOPN: 60.0000 [IU] | PEN_INJECTOR | Freq: Every day | SUBCUTANEOUS | 99 refills | Status: DC

## 2020-06-02 NOTE — Telephone Encounter (Signed)
Sent!

## 2020-06-02 NOTE — Telephone Encounter (Signed)
Pt called regarding her Toujeo rx. Per pt, for a 30 d/s, the cost is $193/mth. Pt contacted her insurance and was informed that semglee is covered by plan. Pt is requesting for provider to switch her rx to the preferred formulary. Pls send to [facility] located in [place].

## 2020-06-14 ENCOUNTER — Ambulatory Visit: Payer: BC Managed Care – PPO | Attending: Internal Medicine

## 2020-06-14 DIAGNOSIS — Z20822 Contact with and (suspected) exposure to covid-19: Secondary | ICD-10-CM | POA: Diagnosis not present

## 2020-06-15 ENCOUNTER — Other Ambulatory Visit: Payer: Self-pay

## 2020-06-15 ENCOUNTER — Ambulatory Visit (INDEPENDENT_AMBULATORY_CARE_PROVIDER_SITE_OTHER): Payer: BC Managed Care – PPO | Admitting: Cardiology

## 2020-06-15 ENCOUNTER — Encounter: Payer: Self-pay | Admitting: Cardiology

## 2020-06-15 VITALS — BP 124/72 | HR 72 | Ht 60.0 in | Wt 165.0 lb

## 2020-06-15 DIAGNOSIS — E782 Mixed hyperlipidemia: Secondary | ICD-10-CM

## 2020-06-15 DIAGNOSIS — Z794 Long term (current) use of insulin: Secondary | ICD-10-CM

## 2020-06-15 DIAGNOSIS — Z951 Presence of aortocoronary bypass graft: Secondary | ICD-10-CM | POA: Diagnosis not present

## 2020-06-15 DIAGNOSIS — E1140 Type 2 diabetes mellitus with diabetic neuropathy, unspecified: Secondary | ICD-10-CM | POA: Diagnosis not present

## 2020-06-15 DIAGNOSIS — I10 Essential (primary) hypertension: Secondary | ICD-10-CM

## 2020-06-15 LAB — SARS-COV-2, NAA 2 DAY TAT

## 2020-06-15 LAB — NOVEL CORONAVIRUS, NAA: SARS-CoV-2, NAA: NOT DETECTED

## 2020-06-15 NOTE — Patient Instructions (Signed)
Medication Instructions:  Your physician recommends that you continue on your current medications as directed. Please refer to the Current Medication list given to you today.  *If you need a refill on your cardiac medications before your next appointment, please call your pharmacy*   Lab Work: Your physician recommends that you return for lab work today: lipid, hemoglobin a 1 c  If you have labs (blood work) drawn today and your tests are completely normal, you will receive your results only by: Marland Kitchen MyChart Message (if you have MyChart) OR . A paper copy in the mail If you have any lab test that is abnormal or we need to change your treatment, we will call you to review the results.   Testing/Procedures: None   Follow-Up: At [facility], you and your health needs are our priority.  As part of our continuing mission to provide you with exceptional heart care, we have created designated Provider Care Teams.  These Care Teams include your primary Cardiologist (physician) and Advanced Practice Providers (APPs -  Physician Assistants and Nurse Practitioners) who all work together to provide you with the care you need, when you need it.  We recommend signing up for the patient portal called "MyChart".  Sign up information is provided on this After Visit Summary.  MyChart is used to connect with patients for Virtual Visits (Telemedicine).  Patients are able to view lab/test results, encounter notes, upcoming appointments, etc.  Non-urgent messages can be sent to your provider as well.   To learn more about what you can do with MyChart, go to [email or web address].    Your next appointment:   5 month(s)  The format for your next appointment:   In Person  Provider:   Jenne Campus, MD   Other Instructions

## 2020-06-15 NOTE — Progress Notes (Signed)
Cardiology Office Note:    Date:  06/15/2020   ID:  Tonya Chandler, DOB [birth year], MRN [id number]  PCP:  Emeterio Reeve, DO  Cardiologist:  Jenne Campus, MD    Referring MD: Emeterio Reeve, DO   Chief Complaint  Patient presents with  . Follow-up  Cardiac wise am doing well  History of Present Illness:    Sydnee Lamour is a 60 y.o. female with past medical history significant for coronary artery disease, status post coronary artery bypass grafting May 2021 with LIMA to LAD, SVG to RCA, SVG to circumflex, diabetes which is poorly controlled, essential hypertension, dyslipidemia. She comes today 2 months of follow-up last time I saw her she was evaluated before rotator cuff surgery however surgery was canceled because of her hemoglobin A1c was very elevated she is disappointed about it but she understand the rationale why it surgery was canceled.  Overall she is doing well cardiac wise she denies have any chest pain tightness squeezing pressure burning chest she actually tells me today that she feels significantly better her endurance is better she can walk climb stairs without shortness of breath or pain.  Past Medical History:  Diagnosis Date  . Abnormal ECG 04/01/2018  . Abnormal stress echocardiogram 05/24/2019   Formatting of this note might be different from the original. Added automatically from request for surgery [id number]  . Abnormal stress test 05/24/2019   Formatting of this note might be different from the original. Added automatically from request for surgery [id number]  . Anemia   . Angina pectoris ([place]) 06/04/2019  . Anxiety   . Atherosclerosis of native coronary artery with angina pectoris ([place]) 05/10/2015   Formatting of this note might be different from the original. 30% lesion-[place]iu  . Bilateral carpal tunnel syndrome 05/23/2017  . Bilateral knee pain 02/16/2010   Qualifier: Diagnosis of  By: Koleen Nimrod MD, Dellis Filbert    . Carpal tunnel syndrome, right 12/16/2017  . Colon polyps    . Depression   . DEPRESSION 02/16/2010   Qualifier: Diagnosis of  By: Koleen Nimrod MD, Dellis Filbert    . Depression, major, in partial remission ([place]) 07/14/2015  . Diabetes mellitus   . Diverticulosis   . Dupuytren's contracture of right hand 06/24/2017  . Essential hypertension 02/16/2010   Qualifier: Diagnosis of  By: Koleen Nimrod MD, Dellis Filbert    . Gastro-esophageal reflux   . Gastrointestinal ulcer   . Generalized anxiety disorder 03/14/2015  . GERD 02/16/2010   Qualifier: Diagnosis of  By: Koleen Nimrod MD, Dellis Filbert    . H/O: GI bleed    Michela Pitcher it was due to taking metformin  . Hyperlipidemia   . Hyperlipidemia LDL goal <70 02/16/2010   Qualifier: Diagnosis of  By: Koleen Nimrod MD, Dellis Filbert    . Hypertension   . IBS (irritable bowel syndrome)   . Impingement syndrome of shoulder, right 06/24/2017  . Midline low back pain with left-sided sciatica 12/29/2013  . Musculoskeletal chest pain 05/04/2015  . Obesity   . Post-nasal drainage 02/10/2020   Last Assessment & Plan:  Formatting of this note might be different from the original. Concern over postnasal drainage. Throat awareness symptoms ever since she was packed in the right nostril last summer.  Was treated with antibiotics with some improvement.  Causes intermittent hoarseness and loss of voice.  Non-smoker. EXAM by anterior rhinoscopy shows no obvious polyps or purulence or obstructi  . Precordial pain 05/04/2015  . Primary insomnia 03/14/2015  . Radiculitis of right cervical region 01/20/2018  . Radiculopathy 04/02/2018  .  Right hand pain 06/24/2017  . RLS (restless legs syndrome) 07/14/2015  . Type 2 diabetes mellitus with diabetic neuropathy, with long-term current use of insulin ([place]) 02/16/2010   Qualifier: Diagnosis of  By: Koleen Nimrod MD, Dellis Filbert    . Unstable angina ([place]) 06/11/2019    Past Surgical History:  Procedure Laterality Date  . ABDOMINAL HYSTERECTOMY    . ANTERIOR CERVICAL DECOMP/DISCECTOMY FUSION N/A 04/02/2018   Procedure: ANTERIOR CERVICAL  DECOMPRESSION FUSION, CERVICAL FOUR-FIVE, CERVICAL FIVE-SIX, WITH INSTRUMENTATION AND ALLOGRAFT;  Surgeon: Phylliss Bob, MD;  Location: [place];  Service: Orthopedics;  Laterality: N/A;  . COLONOSCOPY WITH ESOPHAGOGASTRODUODENOSCOPY (EGD)  05/04/2009  . CORONARY ARTERY BYPASS GRAFT N/A 06/14/2019   Procedure: CORONARY ARTERY BYPASS GRAFTING (CABG) using LIMA to LAD; Endoscopic Right Greater Saphenous Vein Harvest: SVG to Circ (distal); SVG to RCA (distal).;  Surgeon: [name], MD;  Location: [place];  Service: Open Heart Surgery;  Laterality: N/A;  . ENDOVEIN HARVEST OF GREATER SAPHENOUS VEIN Right 06/14/2019   Procedure: [place] Of Greater Saphenous Vein;  Surgeon: [name], MD;  Location: [place];  Service: Open Heart Surgery;  Laterality: Right;  . LEFT HEART CATH AND CORONARY ANGIOGRAPHY N/A 06/11/2019   Procedure: LEFT HEART CATH AND CORONARY ANGIOGRAPHY;  Surgeon: Wellington Hampshire, MD;  Location: [place] [facility];  Service: Cardiovascular;  Laterality: N/A;  . PIP JOINT FUSION Bilateral    pinky  . TEE WITHOUT CARDIOVERSION N/A 06/14/2019   Procedure: TRANSESOPHAGEAL ECHOCARDIOGRAM (TEE);  Surgeon: [name], MD;  Location: [place];  Service: Open Heart Surgery;  Laterality: N/A;    Current Medications: Current Meds  Medication Sig  . acetaminophen (TYLENOL) 325 MG tablet Take 2 tablets (650 mg total) by mouth every 6 (six) hours as needed for mild pain.  Marland Kitchen aspirin EC 81 MG tablet Take 81 mg by mouth daily. Swallow whole.  Marland Kitchen atorvastatin (LIPITOR) 40 MG tablet TAKE 1 TABLET(40 MG) BY MOUTH DAILY (Patient taking differently: Take 40 mg by mouth daily.)  . celecoxib (CELEBREX) 100 MG capsule Take 1 capsule (100 mg total) by mouth 2 (two) times daily. As needed for arthritis  . Cyanocobalamin (VITAMIN B-12) 2500 MCG SUBL Place 2,500 mcg under the tongue daily.  . cyclobenzaprine (FLEXERIL) 10 MG tablet TAKE 1 TABLET(10 MG) BY MOUTH AT BEDTIME (Patient taking  differently: Take 10 mg by mouth at bedtime.)  . dapagliflozin propanediol (FARXIGA) 10 MG TABS tablet Take 1 tablet (10 mg total) by mouth daily.  . DULoxetine (CYMBALTA) 60 MG capsule TAKE 1 CAPSULE(60 MG) BY MOUTH DAILY (Patient taking differently: Take 60 mg by mouth daily.)  . estradiol (ESTRACE) 1 MG tablet Take 1 tablet (1 mg total) by mouth daily.  . Famotidine 20 MG CHEW Chew 20 mg by mouth daily as needed (reflux).   . Insulin Glargine-yfgn (SEMGLEE, YFGN,) 100 UNIT/ML SOPN Inject 60-100 Units into the skin daily. Target FBG 120  . insulin lispro (HUMALOG KWIKPEN) 200 UNIT/ML KwikPen Inject 5 units as directed prior to lunch and dinner (Patient taking differently: Inject 10 Units into the skin See admin instructions. Inject 10 units as directed prior to lunch and dinner)  . lisinopril (ZESTRIL) 5 MG tablet Take 1 tablet (5 mg total) by mouth daily.  Marland Kitchen loteprednol (LOTEMAX) 0.5 % ophthalmic suspension Place 1 drop into the right eye daily as needed (dry eye).   . mirtazapine (REMERON) 45 MG tablet Take 1 tablet (45 mg total) by mouth at bedtime.  Marland Kitchen  nitroGLYCERIN (NITROSTAT) 0.4 MG SL tablet Place 1 tablet (0.4 mg total) under the tongue every 5 (five) minutes as needed for chest pain.  Marland Kitchen omeprazole (PRILOSEC) 40 MG capsule Take 40 mg by mouth daily.  Marland Kitchen rOPINIRole (REQUIP) 3 MG tablet Take 1 tablet (3 mg total) by mouth at bedtime.  . TRULICITY 1.5 NA/3.5TD SOPN ADMINISTER 1.5 MG UNDER THE SKIN EVERY SUNDAY (Patient taking differently: Inject 1.5 mg into the skin every 7 (seven) days. Sunday's)     Allergies:   Adhesive [tape], Ampicillin, Cefprozil, and Meloxicam   Social History   Socioeconomic History  . Marital status: Married    Spouse name: Not on file  . Number of children: 2  . Years of education: Not on file  . Highest education level: Not on file  Occupational History  . Occupation: Tourist information centre manager: [employer]   Tobacco Use  . Smoking status: Never Smoker   . Smokeless tobacco: Never Used  Vaping Use  . Vaping Use: Never used  Substance and Sexual Activity  . Alcohol use: No  . Drug use: No  . Sexual activity: Yes    Partners: Male  Other Topics Concern  . Not on file  Social History Narrative  . Not on file   Social Determinants of Health   Financial Resource Strain: Not on file  Food Insecurity: Not on file  Transportation Needs: Not on file  Physical Activity: Not on file  Stress: Not on file  Social Connections: Not on file     Family History: [name] family history includes Colon cancer in her mother; Colon polyps (age of onset: 24) in her daughter; Dementia in her mother; Diabetes in her brother, father, and mother; Epilepsy in her brother; Heart attack in her brother, father, and mother; High blood pressure in her brother and mother; Ovarian cancer in her mother. There is no history of Esophageal cancer, Rectal cancer, Stomach cancer, or Heart disease. ROS:   Please see the history of present illness.    All 14 point review of systems negative except as described per history of present illness  EKGs/Labs/Other Studies Reviewed:      Recent Labs: 09/15/2019: ALT 11; Hemoglobin 12.7; Platelets 303 05/23/2020: BUN 9; Creatinine, Ser 0.61; Potassium 4.6; Sodium 141  Recent Lipid Panel    Component Value Date/Time   CHOL 177 11/30/2019 1600   TRIG 422 (H) 11/30/2019 1600   HDL 48 11/30/2019 1600   CHOLHDL 3.7 11/30/2019 1600   CHOLHDL 3.7 09/30/2018 1218   LDLCALC 64 11/30/2019 1600   LDLCALC  09/30/2018 1218     Comment:     . LDL cholesterol not calculated. Triglyceride levels greater than 400 mg/dL invalidate calculated LDL results. . Reference range: <100 . Desirable range <100 mg/dL for primary prevention;   <70 mg/dL for patients with CHD or diabetic patients  with > or = 2 CHD risk factors. Marland Kitchen LDL-C is now calculated using the Martin-Hopkins  calculation, which is a validated novel method providing   better accuracy than the Friedewald equation in the  estimation of LDL-C.  Cresenciano Genre et al. Annamaria Helling. 3220;254(27): 2061-2068  (http://education.[employer].com/faq/FAQ164)     Physical Exam:    VS:  BP 124/72 (BP Location: Right Arm, Patient Position: Sitting)   Pulse 72   Ht 5' (1.524 m)   Wt 165 lb (74.8 kg)   SpO2 94%   BMI 32.22 kg/m     Wt Readings from Last  3 Encounters:  06/15/20 165 lb (74.8 kg)  05/19/20 162 lb (73.5 kg)  05/10/20 164 lb 1.9 oz (74.4 kg)     GEN:  Well nourished, well developed in no acute distress HEENT: Normal NECK: No JVD; No carotid bruits LYMPHATICS: No lymphadenopathy CARDIAC: RRR, no murmurs, no rubs, no gallops RESPIRATORY:  Clear to auscultation without rales, wheezing or rhonchi  ABDOMEN: Soft, non-tender, non-distended MUSCULOSKELETAL:  No edema; No deformity  SKIN: Warm and dry LOWER EXTREMITIES: no swelling NEUROLOGIC:  Alert and oriented x 3 PSYCHIATRIC:  Normal affect   ASSESSMENT:    1. S/P CABG x 3 LIMA to LAD, SVG sequential to RCA and circumflex in May 2021   2. Mixed hyperlipidemia   3. Primary hypertension   4. Type 2 diabetes mellitus with diabetic neuropathy, with long-term current use of insulin (HCC)    PLAN:    In order of problems listed above:  1. Coronary artery disease status post coronary bypass grafting in May 2021.  She is doing well from that point review we will continue present management. 2. Dyslipidemia, she is on high intensity statin 40 mg of Lipitor which I will continue, I did review her K PN which show me data from November of last year with LDL of 64 HDL 48.  We will do cholesterol profile today. 3. Essential hypertension, blood pressure well controlled we will continue present management. 4. Diabetes mellitus.  Apparently her hemoglobin A1c was 9.2 and this is the reason why surgery has been canceled.  I do have another hemoglobin A1c from 09/01/2020 which is 8.9.  She works with her primary  care physician as well as she is trying to eat less ice cream.  We did have a long discussion about diabetes management I talked to her about CGM and benefits of this approach with immediate feedback about her diet, I also talked about need to exercise on the regular basis as well as avoidance of carbohydrates which she understands and she will try to do 5. In terms of left shoulder surgery for rotator cuff.  She is stable from cardiac standpoint of view to have the surgery   Medication Adjustments/Labs and Tests Ordered: Current medicines are reviewed at length with the patient today.  Concerns regarding medicines are outlined above.  No orders of the defined types were placed in this encounter.  Medication changes: No orders of the defined types were placed in this encounter.   Signed, Park Liter, MD, [facility] 06/15/2020 3:13 PM    [place]

## 2020-06-16 LAB — LIPID PANEL
Chol/HDL Ratio: 2.9 ratio (ref 0.0–4.4)
Cholesterol, Total: 160 mg/dL (ref 100–199)
HDL: 56 mg/dL (ref >39)
LDL Chol Calc (NIH): 62 mg/dL (ref 0–99)
Triglycerides: 265 mg/dL — ABNORMAL HIGH (ref 0–149)
VLDL Cholesterol Cal: 42 mg/dL — ABNORMAL HIGH (ref 5–40)

## 2020-06-16 LAB — HEMOGLOBIN A1C
Est. average glucose Bld gHb Est-mCnc: 212 mg/dL
Hgb A1c MFr Bld: 9 % — ABNORMAL HIGH (ref 4.8–5.6)

## 2020-06-18 ENCOUNTER — Other Ambulatory Visit: Payer: Self-pay | Admitting: Cardiology

## 2020-06-18 ENCOUNTER — Other Ambulatory Visit: Payer: Self-pay | Admitting: Osteopathic Medicine

## 2020-06-20 ENCOUNTER — Other Ambulatory Visit: Payer: Self-pay

## 2020-06-20 DIAGNOSIS — M5412 Radiculopathy, cervical region: Secondary | ICD-10-CM

## 2020-06-20 MED ORDER — GABAPENTIN 800 MG PO TABS: ORAL_TABLET | ORAL | 3 refills | Status: DC

## 2020-06-26 ENCOUNTER — Ambulatory Visit: Payer: BC Managed Care – PPO | Admitting: Cardiology

## 2020-06-26 ENCOUNTER — Encounter: Payer: Self-pay | Admitting: Osteopathic Medicine

## 2020-06-26 MED ORDER — FLUCONAZOLE 150 MG PO TABS: 150.0000 mg | ORAL_TABLET | Freq: Once | ORAL | 1 refills | Status: AC

## 2020-06-27 ENCOUNTER — Emergency Department (HOSPITAL_BASED_OUTPATIENT_CLINIC_OR_DEPARTMENT_OTHER)
Admission: EM | Admit: 2020-06-27 | Discharge: 2020-06-27 | Disposition: A | Discharge status: Home / Self Care | Payer: BC Managed Care – PPO | Attending: Emergency Medicine | Admitting: Emergency Medicine

## 2020-06-27 ENCOUNTER — Emergency Department (HOSPITAL_BASED_OUTPATIENT_CLINIC_OR_DEPARTMENT_OTHER): Payer: BC Managed Care – PPO

## 2020-06-27 ENCOUNTER — Other Ambulatory Visit: Payer: Self-pay

## 2020-06-27 ENCOUNTER — Encounter (HOSPITAL_BASED_OUTPATIENT_CLINIC_OR_DEPARTMENT_OTHER): Payer: Self-pay | Admitting: *Deleted

## 2020-06-27 DIAGNOSIS — Z7984 Long term (current) use of oral hypoglycemic drugs: Secondary | ICD-10-CM | POA: Diagnosis not present

## 2020-06-27 DIAGNOSIS — Z794 Long term (current) use of insulin: Secondary | ICD-10-CM | POA: Diagnosis not present

## 2020-06-27 DIAGNOSIS — M25551 Pain in right hip: Secondary | ICD-10-CM | POA: Insufficient documentation

## 2020-06-27 DIAGNOSIS — I10 Essential (primary) hypertension: Secondary | ICD-10-CM | POA: Insufficient documentation

## 2020-06-27 DIAGNOSIS — Z79899 Other long term (current) drug therapy: Secondary | ICD-10-CM | POA: Insufficient documentation

## 2020-06-27 DIAGNOSIS — M79661 Pain in right lower leg: Secondary | ICD-10-CM | POA: Insufficient documentation

## 2020-06-27 DIAGNOSIS — M79604 Pain in right leg: Secondary | ICD-10-CM | POA: Diagnosis not present

## 2020-06-27 DIAGNOSIS — E1140 Type 2 diabetes mellitus with diabetic neuropathy, unspecified: Secondary | ICD-10-CM | POA: Insufficient documentation

## 2020-06-27 DIAGNOSIS — Z951 Presence of aortocoronary bypass graft: Secondary | ICD-10-CM | POA: Insufficient documentation

## 2020-06-27 DIAGNOSIS — Z7982 Long term (current) use of aspirin: Secondary | ICD-10-CM | POA: Insufficient documentation

## 2020-06-27 DIAGNOSIS — M25561 Pain in right knee: Secondary | ICD-10-CM | POA: Diagnosis not present

## 2020-06-27 DIAGNOSIS — I25118 Atherosclerotic heart disease of native coronary artery with other forms of angina pectoris: Secondary | ICD-10-CM | POA: Insufficient documentation

## 2020-06-27 DIAGNOSIS — S8991XA Unspecified injury of right lower leg, initial encounter: Secondary | ICD-10-CM | POA: Diagnosis not present

## 2020-06-27 DIAGNOSIS — W19XXXA Unspecified fall, initial encounter: Secondary | ICD-10-CM

## 2020-06-27 MED ORDER — METHOCARBAMOL 500 MG PO TABS: 500.0000 mg | ORAL_TABLET | Freq: Two times a day (BID) | ORAL | 0 refills | Status: DC

## 2020-06-27 MED ORDER — OXYCODONE-ACETAMINOPHEN 5-325 MG PO TABS
1.0000 | ORAL_TABLET | Freq: Once | ORAL | Status: AC
Start: 2020-06-27 — End: 2020-06-27
  Administered 2020-06-27: 1 via ORAL
  Filled 2020-06-27: qty 1

## 2020-06-27 MED ORDER — ACETAMINOPHEN 325 MG PO TABS
650.0000 mg | ORAL_TABLET | Freq: Once | ORAL | Status: AC
  Administered 2020-06-27: 650 mg via ORAL
  Filled 2020-06-27: qty 2

## 2020-06-27 NOTE — ED Provider Notes (Signed)
[facility] Provider Note   CSN: [id number] Arrival date & time: 06/27/20  1816     History Chief Complaint  Patient presents with  . Leg Injury    Tonya Chandler is a 60 y.o. female.  HPI Patient is a 60 year old female with past medical history detailed below.  She history of bilateral chronic knee pain  Patient is presenting today with right knee, right hip and right shin pain she states that she was driving a lawnmower approximately 5:15 PM this afternoon when she rolled the riding lawnmower over and in the process it smacked into the side of her leg and bent her knee and a varus force.  She states that she had sudden onset of right knee pain, right shin pain and although she does not fall to the ground states that she hobbled and had to sit down.  She did not strike her head fall, lose consciousness, experience any nausea vomiting chest pain or shortness of breath.  She came to the ER for evaluation of her leg pain.  She has taken no medications prior to arrival other than her morning blood pressure medicine.  She denies any other associate symptoms or injuries.  Denies any arm leg back neck or head pain.  She has taken medications for pain.  No other associate symptoms.     Past Medical History:  Diagnosis Date  . Abnormal ECG 04/01/2018  . Abnormal stress echocardiogram 05/24/2019   Formatting of this note might be different from the original. Added automatically from request for surgery [id number]  . Abnormal stress test 05/24/2019   Formatting of this note might be different from the original. Added automatically from request for surgery [id number]  . Anemia   . Angina pectoris ([place]) 06/04/2019  . Anxiety   . Atherosclerosis of native coronary artery with angina pectoris ([place]) 05/10/2015   Formatting of this note might be different from the original. 30% lesion-[place]  . Bilateral carpal tunnel syndrome 05/23/2017  . Bilateral knee pain 02/16/2010   Qualifier:  Diagnosis of  By: Koleen Nimrod MD, Dellis Filbert    . Carpal tunnel syndrome, right 12/16/2017  . Colon polyps   . Depression   . DEPRESSION 02/16/2010   Qualifier: Diagnosis of  By: Koleen Nimrod MD, Dellis Filbert    . Depression, major, in partial remission ([place]) 07/14/2015  . Diabetes mellitus   . Diverticulosis   . Dupuytren's contracture of right hand 06/24/2017  . Essential hypertension 02/16/2010   Qualifier: Diagnosis of  By: Koleen Nimrod MD, Dellis Filbert    . Gastro-esophageal reflux   . Gastrointestinal ulcer   . Generalized anxiety disorder 03/14/2015  . GERD 02/16/2010   Qualifier: Diagnosis of  By: Koleen Nimrod MD, Dellis Filbert    . H/O: GI bleed    Michela Pitcher it was due to taking metformin  . Hyperlipidemia   . Hyperlipidemia LDL goal <70 02/16/2010   Qualifier: Diagnosis of  By: Koleen Nimrod MD, Dellis Filbert    . Hypertension   . IBS (irritable bowel syndrome)   . Impingement syndrome of shoulder, right 06/24/2017  . Midline low back pain with left-sided sciatica 12/29/2013  . Musculoskeletal chest pain 05/04/2015  . Obesity   . Post-nasal drainage 02/10/2020   Last Assessment & Plan:  Formatting of this note might be different from the original. Concern over postnasal drainage. Throat awareness symptoms ever since she was packed in the right nostril last summer.  Was treated with antibiotics with some improvement.  Causes intermittent hoarseness and loss of  voice.  Non-smoker. EXAM by anterior rhinoscopy shows no obvious polyps or purulence or obstructi  . Precordial pain 05/04/2015  . Primary insomnia 03/14/2015  . Radiculitis of right cervical region 01/20/2018  . Radiculopathy 04/02/2018  . Right hand pain 06/24/2017  . RLS (restless legs syndrome) 07/14/2015  . Type 2 diabetes mellitus with diabetic neuropathy, with long-term current use of insulin ([place]) 02/16/2010   Qualifier: Diagnosis of  By: Koleen Nimrod MD, Dellis Filbert    . Unstable angina ([place]) 06/11/2019    Patient Active Problem List   Diagnosis Date Noted  . Obesity   .  IBS (irritable bowel syndrome)   . Hypertension   . Hyperlipidemia   . H/O: GI bleed   . Gastrointestinal ulcer   . Gastro-esophageal reflux   . Diverticulosis   . Depression   . Colon polyps   . Anxiety   . Anemia   . Post-nasal drainage 02/10/2020  . S/P CABG x 3 LIMA to LAD, SVG sequential to RCA and circumflex in May 2021   . Unstable angina ([address]) 06/11/2019  . Angina pectoris ([place]) 06/04/2019  . Abnormal stress test 05/24/2019  . Abnormal stress echocardiogram 05/24/2019  . Radiculopathy 04/02/2018  . Abnormal ECG 04/01/2018  . Radiculitis of right cervical region 01/20/2018  . Carpal tunnel syndrome, right 12/16/2017  . Right hand pain 06/24/2017  . Impingement syndrome of shoulder, right 06/24/2017  . Dupuytren's contracture of right hand 06/24/2017  . Bilateral carpal tunnel syndrome 05/23/2017  . Depression, major, in partial remission ([place]) 07/14/2015  . RLS (restless legs syndrome) 07/14/2015  . Atherosclerosis of native coronary artery with angina pectoris ([place]) 05/10/2015  . Musculoskeletal chest pain 05/04/2015  . Precordial pain 05/04/2015  . Generalized anxiety disorder 03/14/2015  . Primary insomnia 03/14/2015  . Type 2 diabetes mellitus with diabetic neuropathy, with long-term current use of insulin ([place]) 02/16/2010  . Hyperlipidemia LDL goal <70 02/16/2010  . Essential hypertension 02/16/2010  . GERD 02/16/2010  . Bilateral knee pain 02/16/2010    Past Surgical History:  Procedure Laterality Date  . ABDOMINAL HYSTERECTOMY    . ANTERIOR CERVICAL DECOMP/DISCECTOMY FUSION N/A 04/02/2018   Procedure: ANTERIOR CERVICAL DECOMPRESSION FUSION, CERVICAL FOUR-FIVE, CERVICAL FIVE-SIX, WITH INSTRUMENTATION AND ALLOGRAFT;  Surgeon: Phylliss Bob, MD;  Location: [facility];  Service: Orthopedics;  Laterality: N/A;  . COLONOSCOPY WITH ESOPHAGOGASTRODUODENOSCOPY (EGD)  05/04/2009  . CORONARY ARTERY BYPASS GRAFT N/A 06/14/2019   Procedure: CORONARY ARTERY BYPASS GRAFTING  (CABG) using LIMA to LAD; Endoscopic Right Greater Saphenous Vein Harvest: SVG to Circ (distal); SVG to RCA (distal).;  Surgeon: [name], MD;  Location: [facility];  Service: Open Heart Surgery;  Laterality: N/A;  . ENDOVEIN HARVEST OF GREATER SAPHENOUS VEIN Right 06/14/2019   Procedure: [place] Of Greater Saphenous Vein;  Surgeon: [name], MD;  Location: [facility];  Service: Open Heart Surgery;  Laterality: Right;  . LEFT HEART CATH AND CORONARY ANGIOGRAPHY N/A 06/11/2019   Procedure: LEFT HEART CATH AND CORONARY ANGIOGRAPHY;  Surgeon: Wellington Hampshire, MD;  Location: [facility];  Service: Cardiovascular;  Laterality: N/A;  . PIP JOINT FUSION Bilateral    pinky  . TEE WITHOUT CARDIOVERSION N/A 06/14/2019   Procedure: TRANSESOPHAGEAL ECHOCARDIOGRAM (TEE);  Surgeon: [name], MD;  Location: [facility];  Service: Open Heart Surgery;  Laterality: N/A;     OB History   No obstetric history on file.     Family History  Problem Relation Age of Onset  .  Diabetes Mother   . Dementia Mother   . High blood pressure Mother   . Heart attack Mother   . Colon cancer Mother   . Ovarian cancer Mother   . Heart attack Father   . Diabetes Father   . Heart attack Brother   . High blood pressure Brother   . Diabetes Brother   . Epilepsy Brother   . Colon polyps Daughter 64  . Esophageal cancer Neg Hx   . Rectal cancer Neg Hx   . Stomach cancer Neg Hx   . Heart disease Neg Hx     Social History   Tobacco Use  . Smoking status: Never Smoker  . Smokeless tobacco: Never Used  Vaping Use  . Vaping Use: Never used  Substance Use Topics  . Alcohol use: No  . Drug use: No    Home Medications Prior to Admission medications   Medication Sig Start Date End Date Taking? Authorizing Provider  mirtazapine (REMERON) 45 MG tablet TAKE 1 TABLET(45 MG) BY MOUTH AT BEDTIME 06/20/20   Emeterio Reeve, DO  acetaminophen (TYLENOL) 325 MG tablet Take 2 tablets (650 mg  total) by mouth every 6 (six) hours as needed for mild pain. 06/20/19   Elgie Collard, PA-C  aspirin EC 81 MG tablet Take 81 mg by mouth daily. Swallow whole.    [provider]  atorvastatin (LIPITOR) 40 MG tablet TAKE 1 TABLET(40 MG) BY MOUTH DAILY Patient taking differently: Take 40 mg by mouth daily. 09/17/19   Emeterio Reeve, DO  celecoxib (CELEBREX) 100 MG capsule Take 1 capsule (100 mg total) by mouth 2 (two) times daily. As needed for arthritis 05/10/20   Samuel Bouche, NP  Cyanocobalamin (VITAMIN B-12) 2500 MCG SUBL Place 2,500 mcg under the tongue daily.    [provider]  cyclobenzaprine (FLEXERIL) 10 MG tablet TAKE 1 TABLET(10 MG) BY MOUTH AT BEDTIME Patient taking differently: Take 10 mg by mouth at bedtime. 12/17/19   Silverio Decamp, MD  dapagliflozin propanediol (FARXIGA) 10 MG TABS tablet Take 1 tablet (10 mg total) by mouth daily. 12/30/19   Emeterio Reeve, DO  DULoxetine (CYMBALTA) 60 MG capsule TAKE 1 CAPSULE(60 MG) BY MOUTH DAILY Patient taking differently: Take 60 mg by mouth daily. 10/18/19   Emeterio Reeve, DO  estradiol (ESTRACE) 1 MG tablet Take 1 tablet (1 mg total) by mouth daily. 09/30/19   Emeterio Reeve, DO  Famotidine 20 MG CHEW Chew 20 mg by mouth daily as needed (reflux).     [provider]  gabapentin (NEURONTIN) 800 MG tablet TAKE 1 TABLET BY MOUTH IN THE MORNING, 1 TABLET AT MIDDAY AND 2 TABLETS AT BEDTIME 06/20/20   Emeterio Reeve, DO  Insulin Glargine-yfgn (SEMGLEE, YFGN,) 100 UNIT/ML SOPN Inject 60-100 Units into the skin daily. Target FBG 120 06/02/20   Emeterio Reeve, DO  insulin lispro (HUMALOG KWIKPEN) 200 UNIT/ML KwikPen Inject 5 units as directed prior to lunch and dinner Patient taking differently: Inject 10 Units into the skin See admin instructions. Inject 10 units as directed prior to lunch and dinner 06/20/19   Elgie Collard, PA-C  lisinopril (ZESTRIL) 5 MG tablet Take 1 tablet (5 mg total) by mouth  daily. 05/19/20   Park Liter, MD  loteprednol (LOTEMAX) 0.5 % ophthalmic suspension Place 1 drop into the right eye daily as needed (dry eye).     [provider]  magnesium oxide (MAG-OX) 400 MG tablet Take 800 mg by mouth at bedtime.  [provider]  metoprolol tartrate (LOPRESSOR) 25 MG tablet TAKE 1 TABLET(25 MG) BY MOUTH TWICE DAILY Patient taking differently: Take 25 mg by mouth 2 (two) times daily. 05/19/20   Park Liter, MD  nitroGLYCERIN (NITROSTAT) 0.4 MG SL tablet Place 1 tablet (0.4 mg total) under the tongue every 5 (five) minutes as needed for chest pain. 09/30/19   Emeterio Reeve, DO  omeprazole (PRILOSEC) 40 MG capsule Take 40 mg by mouth daily.    [provider]  rOPINIRole (REQUIP) 3 MG tablet Take 1 tablet (3 mg total) by mouth at bedtime. 12/30/19   Emeterio Reeve, DO  TRULICITY 1.5 VZ/8.5YI SOPN ADMINISTER 1.5 MG UNDER THE SKIN EVERY SUNDAY Patient taking differently: Inject 1.5 mg into the skin every 7 (seven) days. Sunday's 03/24/20   Emeterio Reeve, DO    Allergies    Adhesive [tape], Ampicillin, Cefprozil, and Meloxicam  Review of Systems   Review of Systems  Constitutional: Negative for chills and fever.  HENT: Negative for congestion.   Eyes: Negative for pain.  Respiratory: Negative for cough and shortness of breath.   Cardiovascular: Negative for chest pain and leg swelling.  Gastrointestinal: Negative for abdominal pain and vomiting.  Genitourinary: Negative for dysuria.  Musculoskeletal: Negative for myalgias.       Right knee, shin, hip pain  Skin: Negative for rash.  Neurological: Negative for dizziness and headaches.    Physical Exam Updated Vital Signs BP (!) 198/92 (BP Location: Right Arm)   Pulse 77   Temp 98.2 F (36.8 C) (Oral)   Resp 20   Ht 5' (1.524 m)   Wt 73.5 kg   SpO2 95%   BMI 31.64 kg/m   Physical Exam Vitals and nursing note reviewed.  Constitutional:      General: She  is not in acute distress. HENT:     Head: Normocephalic and atraumatic.     Nose: Nose normal.  Eyes:     General: No scleral icterus. Cardiovascular:     Rate and Rhythm: Normal rate and regular rhythm.     Pulses: Normal pulses.     Heart sounds: Normal heart sounds.     Comments: Bilateral DP PT pulses 3+ and symmetric Pulmonary:     Effort: Pulmonary effort is normal. No respiratory distress.     Breath sounds: No wheezing.  Abdominal:     Palpations: Abdomen is soft.     Tenderness: There is no abdominal tenderness.  Musculoskeletal:     Cervical back: Normal range of motion.     Right lower leg: No edema.     Left lower leg: No edema.     Comments: PatientDiffuse tenderness to palpation of the right hip, right knee and which is diffuse nonfocal.  There is no step-off deformity or focal bruising.  Head is atraumatic normocephalic.  No C, T, L-spine tenderness palpation.  No upper extremity tenderness to palpation.  Skin:    General: Skin is warm and dry.     Capillary Refill: Capillary refill takes less than 2 seconds.  Neurological:     Mental Status: She is alert. Mental status is at baseline.  Psychiatric:        Mood and Affect: Mood normal.        Behavior: Behavior normal.     ED Results / Procedures / Treatments   Labs (all labs ordered are listed, but only abnormal results are displayed) Labs Reviewed - No data to display  EKG None  Radiology DG Tibia/Fibula Right  Result Date: 06/27/2020 CLINICAL DATA:  Leg injury.  Lawnmower fell onto her leg. EXAM: RIGHT TIBIA AND FIBULA - 2 VIEW COMPARISON:  None. FINDINGS: There is no evidence of fracture or other focal bone lesions. Soft tissues are unremarkable. IMPRESSION: Negative. Electronically Signed   By: Kerby Moors M.D.   On: 06/27/2020 19:31   DG Knee Complete 4 Views Right  Result Date: 06/27/2020 CLINICAL DATA:  Right lower extremity injury, fall from lawnmower EXAM: RIGHT KNEE - COMPLETE 4+ VIEW  COMPARISON:  None. FINDINGS: No right knee fracture, significant joint effusion or dislocation. No focal osseous lesions. Small superior right patellar enthesophyte. No significant degenerative arthropathy. Surgical clips in the medial right knee soft tissues. IMPRESSION: No right knee fracture, significant joint effusion or malalignment. Electronically Signed   By: Ilona Sorrel M.D.   On: 06/27/2020 19:32   DG Hip Unilat W or Wo Pelvis 2-3 Views Right  Result Date: 06/27/2020 CLINICAL DATA:  Fall with right hip pain. Lawnmower fell onto right leg. EXAM: DG HIP (WITH OR WITHOUT PELVIS) 2-3V RIGHT COMPARISON:  None. FINDINGS: Right hip joint space is preserved. The femoral head is well seated. No fracture. Pubic rami in the remainder of the bony pelvis are intact. The pubic symphysis and sacroiliac joints are congruent. Mild enthesopathic change adjacent to the greater trochanter. Surgical clip in the right groin. IMPRESSION: No acute fracture or subluxation of the pelvis or right hip. Electronically Signed   By: Keith Rake M.D.   On: 06/27/2020 22:15    Procedures Procedures   Medications Ordered in ED Medications  oxyCODONE-acetaminophen (PERCOCET/ROXICET) 5-325 MG per tablet 1 tablet (1 tablet Oral Given 06/27/20 2148)  acetaminophen (TYLENOL) tablet 650 mg (650 mg Oral Given 06/27/20 2148)    ED Course  I have reviewed the triage vital signs and the nursing notes.  Pertinent labs & imaging results that were available during my care of the patient were reviewed by me and considered in my medical decision making (see chart for details).  Clinical Course as of 06/27/20 2241  Tue Jun 27, 2020  2146 5:15pm -  [WF]  2223 Knee x-ray, tib-fib and right hip and pelvis x-ray reviewed.  No acute fractures or dislocation.  Patient has reassuring physical exam with some diffuse tenderness but otherwise is well-appearing.  Patient does have elevated blood pressure before any analgesia was given.   Will recheck now that her pain is improved.  Ultimately will need to return home with BP recheck with PCP in the near future.  She is not having any chest pain shortness of breath headache vision changes or other symptoms concerning for hypertensive emergency. [WF]  2236 Discussed patient's elevated blood pressure.  She states that she has not taken her second dose of metoprolol today.  She is overdue for labs.  She states that she can follow-up closely for PCP to get her blood pressure rechecked and I believe this is reasonable. [WF]    Clinical Course User Index [WF] Tedd Sias, PA   MDM Rules/Calculators/A&P                          Patient is 60 year old female presented today after injury to her right leg.  See HPI for details.  Patient has reassuring physical exam but does have some diffuse tenderness of the right hip, knee, tibia.  Will obtain x-rays which were reviewed above.  Ultimately patient seems  to be quite uncomfortable her blood pressure is somewhat elevated we had a discussion about the need for follow-up to recheck this.  May be secondary to pain and not taking her blood pressure medication as prescribed this evening.  Will put in knee brace, provide crutches and Ace wrap knee.  Recommend close follow-up with [facility].  We will follow-up with PCP as well.  Return precautions given.  Final Clinical Impression(s) / ED Diagnoses Final diagnoses:  Fall, initial encounter  Right hip pain  Acute pain of right knee  Pain in right shin    Rx / DC Orders ED Discharge Orders    None       Tedd Sias, [place] 06/27/20 2242    Sherwood Gambler, MD 06/28/20 1559

## 2020-06-27 NOTE — Discharge Instructions (Addendum)
Please rest ice and elevate your leg.  Use Tylenol for pain as discussed below.  Please use Tylenol or ibuprofen for pain.  You may use 600 mg ibuprofen every 6 hours or 1000 mg of Tylenol every 6 hours.  You may choose to alternate between the 2.  This would be most effective.  Not to exceed 4 g of Tylenol within 24 hours.  Not to exceed 3200 mg ibuprofen 24 hours.   Please follow-up with your primary care doctor to recheck your blood pressure.  I have also given you the information for the [facility] which you can follow-up with if you continue to have pain in your leg after the fall.

## 2020-06-27 NOTE — ED Triage Notes (Signed)
C/o right leg injury , left shoulder injury with hx of torn rotator cuff  fall off lawn mower

## 2020-06-29 DIAGNOSIS — S8391XA Sprain of unspecified site of right knee, initial encounter: Secondary | ICD-10-CM | POA: Diagnosis not present

## 2020-07-03 ENCOUNTER — Other Ambulatory Visit: Payer: Self-pay

## 2020-07-03 ENCOUNTER — Ambulatory Visit (INDEPENDENT_AMBULATORY_CARE_PROVIDER_SITE_OTHER): Payer: BC Managed Care – PPO | Admitting: Osteopathic Medicine

## 2020-07-03 VITALS — BP 152/79 | HR 72 | Temp 98.4°F | Wt 172.0 lb

## 2020-07-03 DIAGNOSIS — M25561 Pain in right knee: Secondary | ICD-10-CM | POA: Diagnosis not present

## 2020-07-03 DIAGNOSIS — E1140 Type 2 diabetes mellitus with diabetic neuropathy, unspecified: Secondary | ICD-10-CM | POA: Diagnosis not present

## 2020-07-03 DIAGNOSIS — Z23 Encounter for immunization: Secondary | ICD-10-CM

## 2020-07-03 DIAGNOSIS — E1165 Type 2 diabetes mellitus with hyperglycemia: Secondary | ICD-10-CM

## 2020-07-03 DIAGNOSIS — IMO0002 Reserved for concepts with insufficient information to code with codable children: Secondary | ICD-10-CM

## 2020-07-03 MED ORDER — TRULICITY 3 MG/0.5ML ~~LOC~~ SOAJ: 3.0000 mg | SUBCUTANEOUS | 1 refills | Status: DC

## 2020-07-03 NOTE — Patient Instructions (Signed)
INCREASE TRULICITY from 1.5 mg weekly to 3 mg weeks - can double u on what you have until it's out, I sent 3 mg pens to pharmacy INCREASE HUMALOG to 10 units w/ meals, 15-20 units w/ larger or higher carb meals.  May need to stop [place] if tighter glucose control doesn't help the yeast infections.  Check glucose levels before meals and 2 hours after meals for a couple weeks and let me know what those numbers are looking like!

## 2020-07-03 NOTE — Progress Notes (Signed)
Tonya Chandler is a 60 y.o. female who presents to  [place] at [facility]  today, 07/03/20, seeking care for the following:  Follow up diabetes - needs A1C 8.2 or less in order to move forward with rotator cuff surgery      ASSESSMENT & PLAN with other pertinent findings:  The primary encounter diagnosis was Type 2 diabetes, uncontrolled, with neuropathy ([place]). A diagnosis of Need for shingles vaccine was also pertinent to this visit.    Patient Instructions  INCREASE TRULICITY from 1.5 mg weekly to 3 mg weeks - can double u on what you have until it's out, I sent 3 mg pens to pharmacy INCREASE HUMALOG to 10 units w/ meals, 15-20 units w/ larger or higher carb meals.  May need to stop [place] if tighter glucose control doesn't help the yeast infections.  Check glucose levels before meals and 2 hours after meals for a couple weeks and let me know what those numbers are looking like!    Orders Placed This Encounter  Procedures   Varicella-zoster vaccine IM (Shingrix)    Meds ordered this encounter  Medications   Dulaglutide (TRULICITY) 3 VK/1.2AE SOPN    Sig: Inject 3 mg as directed once a week.    Dispense:  6 mL    Refill:  1     See below for relevant physical exam findings  See below for recent lab and imaging results reviewed  Medications, allergies, PMH, PSH, SocH, FamH reviewed below    Follow-up instructions: Return in about 2 months (around 09/02/2020) for MONITOR A1C .                                        Exam:  BP (!) 152/79 (BP Location: Left Arm, Patient Position: Sitting, Cuff Size: Large)   Pulse 72   Temp 98.4 F (36.9 C) (Oral)   Wt 172 lb 0.6 oz (78 kg)   BMI 33.60 kg/m  Constitutional: VS see above. General Appearance: alert, well-developed, well-nourished, NAD Neck: No masses, trachea midline.  Respiratory: Normal respiratory effort.  Neurological: Normal  balance/coordination. No tremor. Skin: warm, dry, intact.  Psychiatric: Normal judgment/insight. Normal mood and affect. Oriented x3.   Current Meds  Medication Sig   acetaminophen (TYLENOL) 325 MG tablet Take 2 tablets (650 mg total) by mouth every 6 (six) hours as needed for mild pain.   aspirin EC 81 MG tablet Take 81 mg by mouth daily. Swallow whole.   atorvastatin (LIPITOR) 40 MG tablet TAKE 1 TABLET(40 MG) BY MOUTH DAILY (Patient taking differently: Take 40 mg by mouth daily.)   celecoxib (CELEBREX) 100 MG capsule Take 1 capsule (100 mg total) by mouth 2 (two) times daily. As needed for arthritis   Cyanocobalamin (VITAMIN B-12) 2500 MCG SUBL Place 2,500 mcg under the tongue daily.   cyclobenzaprine (FLEXERIL) 10 MG tablet TAKE 1 TABLET(10 MG) BY MOUTH AT BEDTIME (Patient taking differently: Take 10 mg by mouth at bedtime.)   dapagliflozin propanediol (FARXIGA) 10 MG TABS tablet Take 1 tablet (10 mg total) by mouth daily.   Dulaglutide (TRULICITY) 3 SL/7.5PY SOPN Inject 3 mg as directed once a week.   DULoxetine (CYMBALTA) 60 MG capsule TAKE 1 CAPSULE(60 MG) BY MOUTH DAILY (Patient taking differently: Take 60 mg by mouth daily.)   estradiol (ESTRACE) 1 MG tablet Take 1 tablet (1 mg total)  by mouth daily.   Famotidine 20 MG CHEW Chew 20 mg by mouth daily as needed (reflux).    gabapentin (NEURONTIN) 800 MG tablet TAKE 1 TABLET BY MOUTH IN THE MORNING, 1 TABLET AT MIDDAY AND 2 TABLETS AT BEDTIME   Insulin Glargine-yfgn (SEMGLEE, YFGN,) 100 UNIT/ML SOPN Inject 60-100 Units into the skin daily. Target FBG 120   insulin lispro (HUMALOG KWIKPEN) 200 UNIT/ML KwikPen Inject 5 units as directed prior to lunch and dinner (Patient taking differently: Inject 10 Units into the skin See admin instructions. Inject 10 units as directed prior to lunch and dinner)   lisinopril (ZESTRIL) 5 MG tablet Take 1 tablet (5 mg total) by mouth daily.   loteprednol (LOTEMAX) 0.5 % ophthalmic suspension Place 1 drop  into the right eye daily as needed (dry eye).    magnesium oxide (MAG-OX) 400 MG tablet Take 800 mg by mouth at bedtime.    methocarbamol (ROBAXIN) 500 MG tablet Take 1 tablet (500 mg total) by mouth 2 (two) times daily.   metoprolol tartrate (LOPRESSOR) 25 MG tablet TAKE 1 TABLET(25 MG) BY MOUTH TWICE DAILY (Patient taking differently: Take 25 mg by mouth 2 (two) times daily.)   mirtazapine (REMERON) 45 MG tablet TAKE 1 TABLET(45 MG) BY MOUTH AT BEDTIME   nitroGLYCERIN (NITROSTAT) 0.4 MG SL tablet Place 1 tablet (0.4 mg total) under the tongue every 5 (five) minutes as needed for chest pain.   omeprazole (PRILOSEC) 40 MG capsule Take 40 mg by mouth daily.   rOPINIRole (REQUIP) 3 MG tablet Take 1 tablet (3 mg total) by mouth at bedtime.   [DISCONTINUED] TRULICITY 1.5 YI/9.4WN SOPN ADMINISTER 1.5 MG UNDER THE SKIN EVERY SUNDAY (Patient taking differently: Inject 1.5 mg into the skin every 7 (seven) days. Sunday's)    Allergies  Allergen Reactions   Adhesive [Tape] Hives    Welts--PLEASE USE HYPO ALLERGIC TAPE   Ampicillin Diarrhea    Did it involve swelling of the face/tongue/throat, SOB, or low BP? No Did it involve sudden or severe rash/hives, skin peeling, or any reaction on the inside of your mouth or nose? No Did you need to seek medical attention at a hospital or doctor's office? No When did it last happen? 30-40 years ago      If all above answers are "NO", may proceed with cephalosporin use.    Cefprozil Other (See Comments)    Acid reflux/indigestion issues   Meloxicam Other (See Comments)    Acid reflux/indigestion issues    Patient Active Problem List   Diagnosis Date Noted   Obesity    IBS (irritable bowel syndrome)    Hypertension    Hyperlipidemia    H/O: GI bleed    Gastrointestinal ulcer    Gastro-esophageal reflux    Diverticulosis    Depression    Colon polyps    Anxiety    Anemia    Post-nasal drainage 02/10/2020   S/P CABG x 3 LIMA to LAD, SVG sequential  to RCA and circumflex in May 2021    Unstable angina ([place]) 06/11/2019   Angina pectoris ([place]) 06/04/2019   Abnormal stress test 05/24/2019   Abnormal stress echocardiogram 05/24/2019   Radiculopathy 04/02/2018   Abnormal ECG 04/01/2018   Radiculitis of right cervical region 01/20/2018   Carpal tunnel syndrome, right 12/16/2017   Right hand pain 06/24/2017   Impingement syndrome of shoulder, right 06/24/2017   Dupuytren's contracture of right hand 06/24/2017   Bilateral carpal tunnel syndrome 05/23/2017   Depression,  major, in partial remission ([place]) 07/14/2015   RLS (restless legs syndrome) 07/14/2015   Atherosclerosis of native coronary artery with angina pectoris ([place]) 05/10/2015   Musculoskeletal chest pain 05/04/2015   Precordial pain 05/04/2015   Generalized anxiety disorder 03/14/2015   Primary insomnia 03/14/2015   Type 2 diabetes mellitus with diabetic neuropathy, with long-term current use of insulin ([place]) 02/16/2010   Hyperlipidemia LDL goal <70 02/16/2010   Essential hypertension 02/16/2010   GERD 02/16/2010   Bilateral knee pain 02/16/2010    Family History  Problem Relation Age of Onset   Diabetes Mother    Dementia Mother    High blood pressure Mother    Heart attack Mother    Colon cancer Mother    Ovarian cancer Mother    Heart attack Father    Diabetes Father    Heart attack Brother    High blood pressure Brother    Diabetes Brother    Epilepsy Brother    Colon polyps Daughter 28   Esophageal cancer Neg Hx    Rectal cancer Neg Hx    Stomach cancer Neg Hx    Heart disease Neg Hx     Social History   Tobacco Use  Smoking Status Never  Smokeless Tobacco Never    Past Surgical History:  Procedure Laterality Date   ABDOMINAL HYSTERECTOMY     ANTERIOR CERVICAL DECOMP/DISCECTOMY FUSION N/A 04/02/2018   Procedure: ANTERIOR CERVICAL DECOMPRESSION FUSION, CERVICAL FOUR-FIVE, CERVICAL FIVE-SIX, WITH INSTRUMENTATION AND ALLOGRAFT;  Surgeon: Phylliss Bob, MD;  Location: [place];  Service: Orthopedics;  Laterality: N/A;   COLONOSCOPY WITH ESOPHAGOGASTRODUODENOSCOPY (EGD)  05/04/2009   CORONARY ARTERY BYPASS GRAFT N/A 06/14/2019   Procedure: CORONARY ARTERY BYPASS GRAFTING (CABG) using LIMA to LAD; Endoscopic Right Greater Saphenous Vein Harvest: SVG to Circ (distal); SVG to RCA (distal).;  Surgeon: [name], MD;  Location: [place];  Service: Open Heart Surgery;  Laterality: N/A;   ENDOVEIN HARVEST OF GREATER SAPHENOUS VEIN Right 06/14/2019   Procedure: [place] Of Greater Saphenous Vein;  Surgeon: [name], MD;  Location: [place];  Service: Open Heart Surgery;  Laterality: Right;   LEFT HEART CATH AND CORONARY ANGIOGRAPHY N/A 06/11/2019   Procedure: LEFT HEART CATH AND CORONARY ANGIOGRAPHY;  Surgeon: Wellington Hampshire, MD;  Location: [facility];  Service: Cardiovascular;  Laterality: N/A;   PIP JOINT FUSION Bilateral    pinky   TEE WITHOUT CARDIOVERSION N/A 06/14/2019   Procedure: TRANSESOPHAGEAL ECHOCARDIOGRAM (TEE);  Surgeon: [name], MD;  Location: [place];  Service: Open Heart Surgery;  Laterality: N/A;    Immunization History  Administered Date(s) Administered   Influenza,inj,Quad PF,6+ Mos 11/05/2014, 10/12/2017, 10/12/2017, 09/30/2018, 11/30/2019   Influenza-Unspecified 10/22/2014, 10/13/2015   PFIZER(Purple Top)SARS-COV-2 Vaccination 03/26/2019, 04/20/2019, 11/18/2019   Tdap 07/22/2011   Zoster Recombinat (Shingrix) 09/30/2018, 07/03/2020    Recent Results (from the past 2160 hour(s))  WET PREP FOR TRICH, YEAST, CLUE     Status: None   Collection Time: 05/10/20 10:27 AM   Specimen: Vaginal Fluid  Result Value Ref Range   MICRO NUMBER: [id number]    Specimen Quality Adequate    SOURCE: VAGINAL    Status FINAL    RESULT      No Trichomonas vaginalis seen. No yeast seen No clue cells seen Epithelial Cells Present  Basic metabolic panel     Status: Abnormal   Collection Time: 05/23/20 10:26  AM  Result Value Ref Range   Glucose  134 (H) 65 - 99 mg/dL   BUN 9 8 - 27 mg/dL   Creatinine, Ser 0.61 0.57 - 1.00 mg/dL   eGFR 102 >59 mL/min/1.73   BUN/Creatinine Ratio 15 12 - 28   Sodium 141 134 - 144 mmol/L   Potassium 4.6 3.5 - 5.2 mmol/L   Chloride 99 96 - 106 mmol/L   CO2 27 20 - 29 mmol/L   Calcium 9.3 8.7 - 10.3 mg/dL  HgB A1c     Status: Abnormal   Collection Time: 06/01/20  9:38 AM  Result Value Ref Range   Hgb A1c MFr Bld 8.9 (H) <5.7 % of total Hgb    Comment: For someone without known diabetes, a hemoglobin A1c value of 6.5% or greater indicates that they may have  diabetes and this should be confirmed with a follow-up  test. . For someone with known diabetes, a value <7% indicates  that their diabetes is well controlled and a value  greater than or equal to 7% indicates suboptimal  control. A1c targets should be individualized based on  duration of diabetes, age, comorbid conditions, and  other considerations. . Currently, no consensus exists regarding use of hemoglobin A1c for diagnosis of diabetes for children. .    Mean Plasma Glucose 209 mg/dL   eAG (mmol/L) 11.6 mmol/L  Novel Coronavirus, NAA ([facility])     Status: None   Collection Time: 06/14/20  9:16 AM   Specimen: Nasopharyngeal(NP) swabs in vial transport medium   Nasopharynge  Testing  Result Value Ref Range   SARS-CoV-2, NAA Not Detected Not Detected    Comment: This nucleic acid amplification test was developed and its performance characteristics determined by [employer]. Nucleic acid amplification tests include RT-PCR and TMA. This test has not been FDA cleared or approved. This test has been authorized by FDA under an Emergency Use Authorization (EUA). This test is only authorized for the duration of time the declaration that circumstances exist justifying the authorization of the emergency use of in vitro diagnostic tests for detection of SARS-CoV-2 virus and/or diagnosis of  COVID-19 infection under section 564(b)(1) of the Act, 21 U.S.C. 161WRU-0(A) (1), unless the authorization is terminated or revoked sooner. When diagnostic testing is negative, the possibility of a false negative result should be considered in the context of a patient's recent exposures and the presence of clinical signs and symptoms consistent with COVID-19. An individual without symptoms of COVID-19 and who is not shedding SARS-CoV-2 virus wo uld expect to have a negative (not detected) result in this assay.   SARS-COV-2, NAA 2 DAY TAT     Status: None   Collection Time: 06/14/20  9:16 AM   Nasopharynge  Testing  Result Value Ref Range   SARS-CoV-2, NAA 2 DAY TAT Performed   Lipid panel     Status: Abnormal   Collection Time: 06/15/20  3:18 PM  Result Value Ref Range   Cholesterol, Total 160 100 - 199 mg/dL   Triglycerides 265 (H) 0 - 149 mg/dL   HDL 56 >39 mg/dL   VLDL Cholesterol Cal 42 (H) 5 - 40 mg/dL   LDL Chol Calc (NIH) 62 0 - 99 mg/dL   Chol/HDL Ratio 2.9 0.0 - 4.4 ratio    Comment:                                   T. Chol/HDL Ratio  Men  Women                               1/2 Avg.Risk  3.4    3.3                                   Avg.Risk  5.0    4.4                                2X Avg.Risk  9.6    7.1                                3X Avg.Risk 23.4   11.0   Hemoglobin A1c     Status: Abnormal   Collection Time: 06/15/20  3:18 PM  Result Value Ref Range   Hgb A1c MFr Bld 9.0 (H) 4.8 - 5.6 %    Comment:          Prediabetes: 5.7 - 6.4          Diabetes: >6.4          Glycemic control for adults with diabetes: <7.0    Est. average glucose Bld gHb Est-mCnc 212 mg/dL    No results found.     All questions at time of visit were answered - patient instructed to contact office with any additional concerns or updates. ER/RTC precautions were reviewed with the patient as applicable.   Please note: manual typing as well  as voice recognition software may have been used to produce this document - typos may escape review. Please contact Dr. Sheppard Coil for any needed clarifications.   Total encounter time on date of service, 07/03/20, was 30 minutes spent addressing problems/issues as noted above in [place], including time spent in discussion with patient regarding the HPI, ROS, confirming history, reviewing Assessment & Plan, as well as time spent on coordination of care, record review.

## 2020-07-06 DIAGNOSIS — S83511A Sprain of anterior cruciate ligament of right knee, initial encounter: Secondary | ICD-10-CM | POA: Diagnosis not present

## 2020-07-06 DIAGNOSIS — M25561 Pain in right knee: Secondary | ICD-10-CM | POA: Diagnosis not present

## 2020-07-10 ENCOUNTER — Other Ambulatory Visit: Payer: Self-pay

## 2020-07-10 ENCOUNTER — Ambulatory Visit (INDEPENDENT_AMBULATORY_CARE_PROVIDER_SITE_OTHER): Payer: BC Managed Care – PPO | Admitting: Rehabilitative and Restorative Service Providers"

## 2020-07-10 DIAGNOSIS — R600 Localized edema: Secondary | ICD-10-CM | POA: Diagnosis not present

## 2020-07-10 DIAGNOSIS — M25561 Pain in right knee: Secondary | ICD-10-CM | POA: Diagnosis not present

## 2020-07-10 DIAGNOSIS — M6281 Muscle weakness (generalized): Secondary | ICD-10-CM

## 2020-07-10 DIAGNOSIS — R2689 Other abnormalities of gait and mobility: Secondary | ICD-10-CM

## 2020-07-10 NOTE — Patient Instructions (Signed)
Access Code: [email or web address] URL: [email or web address] Date: 07/10/2020 Prepared by: Rudell Cobb  Exercises Supine Heel Slide - 2 x daily - 7 x weekly - 1 sets - 10 reps Supine Active Straight Leg Raise - 2 x daily - 7 x weekly - 1 sets - 10-12 reps - 3 seconds hold Supine Quad Set - 2 x daily - 7 x weekly - 1 sets - 10 reps - 3 seconds hold Sidelying Hip Abduction - 2 x daily - 7 x weekly - 1 sets - 10-12 reps - 3 seconds hold Seated Ankle Pumps - 2 x daily - 7 x weekly - 1 sets - 10 reps

## 2020-07-10 NOTE — Therapy (Signed)
[place], [name], [address] Phone: [phone]   Fax:  [phone]  Physical Therapy Evaluation  Patient Details  Name: Tonya Chandler MRN: [id number] Date of Birth: 1960-07-30 Referring Provider (PT): Frederik Pear, MD   Encounter Date: 07/10/2020   PT End of Session - 07/10/20 1307     Visit Number 1    Number of Visits 12    Date for PT Re-Evaluation 08/21/20    Authorization Type [employer] 60 visits/year combined    PT Start Time 0945    PT Stop Time 1031    PT Time Calculation (min) 46 min             Past Medical History:  Diagnosis Date   Abnormal ECG 04/01/2018   Abnormal stress echocardiogram 05/24/2019   Formatting of this note might be different from the original. Added automatically from request for surgery [id number]   Abnormal stress test 05/24/2019   Formatting of this note might be different from the original. Added automatically from request for surgery [id number]   Anemia    Angina pectoris ([place]) 06/04/2019   Anxiety    Atherosclerosis of native coronary artery with angina pectoris ([place]) 05/10/2015   Formatting of this note might be different from the original. 30% lesion-Chiu   Bilateral carpal tunnel syndrome 05/23/2017   Bilateral knee pain 02/16/2010   Qualifier: Diagnosis of  By: Koleen Nimrod MD, Jeffrey     Carpal tunnel syndrome, right 12/16/2017   Colon polyps    Depression    DEPRESSION 02/16/2010   Qualifier: Diagnosis of  By: Koleen Nimrod MD, Dellis Filbert     Depression, major, in partial remission ([place]) 07/14/2015   Diabetes mellitus    Diverticulosis    Dupuytren's contracture of right hand 06/24/2017   Essential hypertension 02/16/2010   Qualifier: Diagnosis of  By: Koleen Nimrod MD, Dellis Filbert     Gastro-esophageal reflux    Gastrointestinal ulcer    Generalized anxiety disorder 03/14/2015   GERD 02/16/2010   Qualifier: Diagnosis of  By: Koleen Nimrod MD, Dellis Filbert     H/O: GI bleed    Said it was due  to taking metformin   Hyperlipidemia    Hyperlipidemia LDL goal <70 02/16/2010   Qualifier: Diagnosis of  By: Koleen Nimrod MD, Dellis Filbert     Hypertension    IBS (irritable bowel syndrome)    Impingement syndrome of shoulder, right 06/24/2017   Midline low back pain with left-sided sciatica 12/29/2013   Musculoskeletal chest pain 05/04/2015   Obesity    Post-nasal drainage 02/10/2020   Last Assessment & Plan:  Formatting of this note might be different from the original. Concern over postnasal drainage. Throat awareness symptoms ever since she was packed in the right nostril last summer.  Was treated with antibiotics with some improvement.  Causes intermittent hoarseness and loss of voice.  Non-smoker. EXAM by anterior rhinoscopy shows no obvious polyps or purulence or obstructi   Precordial pain 05/04/2015   Primary insomnia 03/14/2015   Radiculitis of right cervical region 01/20/2018   Radiculopathy 04/02/2018   Right hand pain 06/24/2017   RLS (restless legs syndrome) 07/14/2015   Type 2 diabetes mellitus with diabetic neuropathy, with long-term current use of insulin ([place]) 02/16/2010   Qualifier: Diagnosis of  By: Koleen Nimrod MD, Jeffrey     Unstable angina [facility]) 06/11/2019    Past Surgical History:  Procedure Laterality Date   ABDOMINAL HYSTERECTOMY     ANTERIOR CERVICAL DECOMP/DISCECTOMY FUSION N/A 04/02/2018  Procedure: ANTERIOR CERVICAL DECOMPRESSION FUSION, CERVICAL FOUR-FIVE, CERVICAL FIVE-SIX, WITH INSTRUMENTATION AND ALLOGRAFT;  Surgeon: Phylliss Bob, MD;  Location: [place];  Service: Orthopedics;  Laterality: N/A;   COLONOSCOPY WITH ESOPHAGOGASTRODUODENOSCOPY (EGD)  05/04/2009   CORONARY ARTERY BYPASS GRAFT N/A 06/14/2019   Procedure: CORONARY ARTERY BYPASS GRAFTING (CABG) using LIMA to LAD; Endoscopic Right Greater Saphenous Vein Harvest: SVG to Circ (distal); SVG to RCA (distal).;  Surgeon: [name], MD;  Location: [place];  Service: Open Heart Surgery;  Laterality: N/A;   ENDOVEIN  HARVEST OF GREATER SAPHENOUS VEIN Right 06/14/2019   Procedure: [place] Of Greater Saphenous Vein;  Surgeon: [name], MD;  Location: [place];  Service: Open Heart Surgery;  Laterality: Right;   LEFT HEART CATH AND CORONARY ANGIOGRAPHY N/A 06/11/2019   Procedure: LEFT HEART CATH AND CORONARY ANGIOGRAPHY;  Surgeon: Wellington Hampshire, MD;  Location: [place] [facility];  Service: Cardiovascular;  Laterality: N/A;   PIP JOINT FUSION Bilateral    pinky   TEE WITHOUT CARDIOVERSION N/A 06/14/2019   Procedure: TRANSESOPHAGEAL ECHOCARDIOGRAM (TEE);  Surgeon: [name], MD;  Location: [facility];  Service: Open Heart Surgery;  Laterality: N/A;    There were no vitals filed for this visit.    Subjective Assessment - 07/10/20 0950     Subjective The patient is 2 weeks post R knee injury when falling from her lawnmower.  She reports a R ACL tear and meniscus tear and presents with an order for R quad strengthening, MCL.    Pertinent History CABG 2021, diabetes, neuropathy, HTN    Patient Stated Goals Strengthen knee, no pain, full use of R leg    Currently in Pain? Yes    Pain Score 2     Pain Location Knee    Pain Orientation Right;Posterior    Pain Descriptors / Indicators Aching;Sore;Discomfort    Pain Onset 1 to 4 weeks ago    Pain Frequency Intermittent    Aggravating Factors  bearing weight-- feels "weak"    Pain Relieving Factors ice                [facility] PT Assessment - 07/10/20 0955       Assessment   Medical Diagnosis strengthen quad, MCL    Referring Provider (PT) Frederik Pear, MD    Onset Date/Surgical Date 06/27/20    Hand Dominance Left    Next MD Visit 07/20/20    Prior Therapy known to our clinic from prior PT for different condition      Precautions   Precautions Knee    Required Braces or Orthoses Knee Immobilizer - Right   knee support     Restrictions   Weight Bearing Restrictions Yes    RLE Weight Bearing Weight bearing as tolerated       Balance Screen   Has the patient fallen in the past 6 months Yes    How many times? 1- after injury had one fall stepping into shower    Has the patient had a decrease in activity level because of a fear of falling?  No    Is the patient reluctant to leave their home because of a fear of falling?  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 [employer]      Observation/Other Assessments   Focus on Therapeutic Outcomes (FOTO)  29%      Observation/Other Assessments-Edema    Edema Circumferential      Circumferential Edema   Circumferential - Right 36.5cm   at knee joint line   Circumferential - Left  39.5cm   at knee joint line     Sensation   Light Touch Appears Intact    Additional Comments does have some neuropathy      ROM / Strength   AROM / PROM / Strength AROM;Strength      AROM   Overall AROM  Deficits    AROM Assessment Site Knee    Right/Left Knee Right    Right Knee Extension -14    Right Knee Flexion 80      Strength   Overall Strength Deficits    Strength Assessment Site Hip;Knee;Ankle    Right/Left Hip Right;Left    Right Hip Flexion 3/5   can do anti-gravity lifting   Right/Left Knee Right;Left    Right Knee Flexion --   guarded     Ambulation/Gait   Ambulation/Gait Yes    Ambulation/Gait Assistance 6: Modified independent (Device/Increase time)    Ambulation Distance (Feet) 100 Feet    Assistive device R Axillary Crutch;L Axillary Crutch    Gait Pattern Step-to pattern    Ambulation Surface Level;Indoor    Gait Comments patient looks unsteady initially with crutches; PT adjusted to lower height by one notch and raised hand grip up one notch.  Patient verbalizes improved ease.                         Objective measurements completed on examination: See above findings.       [facility] Adult PT Treatment/Exercise - 07/10/20 1304       Exercises   Exercises Knee/Hip      Knee/Hip Exercises: Supine   Quad Sets Strengthening;Right;AROM;5 reps    Heel Slides AROM;Right;10 reps    Straight Leg Raises AROM;Strengthening;Right;10 reps    Straight Leg Raises Limitations brace donned    Other Supine Knee/Hip Exercises ankle pumps      Knee/Hip Exercises: Sidelying   Hip ABduction Strengthening;Right;10 reps    Hip ABduction Limitations brace donned      Modalities   Modalities Vasopneumatic      Vasopneumatic   Number Minutes Vasopneumatic  10 minutes    Vasopnuematic Location  Knee    Vasopneumatic Pressure Low    Vasopneumatic Temperature  34                    PT Education - 07/10/20 1019     Education Details HEP    Person(s) Educated Patient    Methods Explanation;Demonstration;Handout    Comprehension Verbalized understanding;Returned demonstration                 PT Long Term Goals - 07/10/20 1031       PT LONG TERM GOAL #1   Title The patient will be indep with HEP.    Time 6    Period Weeks    Target Date 08/21/20      PT LONG TERM GOAL #2   Title The patient will improve AROM from 14-80 up to 3-110 degrees.    Time 6    Period Weeks    Target  Date 08/21/20      PT LONG TERM GOAL #3   Title The patient will improve functional status score from 29 up to 57%.    Time 6    Period Weeks    Target Date 08/21/20      PT LONG TERM GOAL #4   Title The patient will improve R LE strength to tolerate SLR without brace x 15 reps.    Time 6    Period Weeks    Target Date 08/21/20      PT LONG TERM GOAL #5   Title The patient will return to gait without device for home and limited community without increased pain.    Time 6    Period Weeks    Target Date 08/21/20                    Plan - 07/10/20 1307      Clinical Impression Statement The patient is a 60 year old female presenting to OP physical therapy with R knee ligament and meniscus tear on 06/27/20.  She presents with edema, pain, limited AROM, limited PROM, decreased flexibility leading to functional imitations of dec'd household/community gait, dec'd participation in ADLs/IADLs.  PT to address deficits to return to prior functional status.    Personal Factors and Comorbidities Comorbidity 3+    Comorbidities CABG, diabetes, neuropathy    Examination-Activity Limitations Locomotion Level;Sit;Sleep;Stairs;Lift    Stability/Clinical Decision Making Evolving/Moderate complexity    Clinical Decision Making Moderate    Rehab Potential Good    PT Frequency 2x / week    PT Duration 6 weeks    PT Treatment/Interventions ADLs/Self Care Home Management;Dry needling;Manual techniques;Gait training;Stair training;Vasopneumatic Device;Aquatic Therapy;Electrical Stimulation;Cryotherapy;Functional mobility training;Therapeutic activities;Therapeutic exercise;Balance training;Neuromuscular re-education;Patient/family education;Passive range of motion;Taping    PT Next Visit Plan check HEP, progress hip/knee/ankle strengthening to tolerance; begin to slowly progress to WBAT with crutches, flexibility LEs.    PT Home Exercise Plan Access Code: [email or web address]    Consulted and Agree with Plan of Care Patient             Patient will benefit from skilled therapeutic intervention in order to improve the following deficits and impairments:  Pain, Hypomobility, Impaired flexibility, Decreased strength, Decreased range of motion, Difficulty walking, Increased fascial restricitons, Impaired tone, Decreased activity tolerance, Postural dysfunction, Increased edema  Visit Diagnosis: Acute pain of right knee  Muscle weakness (generalized)  Other abnormalities of gait and mobility  Localized edema     Problem List Patient Active Problem List   Diagnosis Date  Noted   Obesity    IBS (irritable bowel syndrome)    Hypertension    Hyperlipidemia    H/O: GI bleed    Gastrointestinal ulcer    Gastro-esophageal reflux    Diverticulosis    Depression    Colon polyps    Anxiety    Anemia    Post-nasal drainage 02/10/2020   S/P CABG x 3 LIMA to LAD, SVG sequential to RCA and circumflex in May 2021    Unstable angina ([place]) 06/11/2019   Angina pectoris ([place]) 06/04/2019   Abnormal stress test 05/24/2019   Abnormal stress echocardiogram 05/24/2019   Radiculopathy 04/02/2018   Abnormal ECG 04/01/2018   Radiculitis of right cervical region 01/20/2018   Carpal tunnel syndrome, right 12/16/2017   Right hand pain 06/24/2017   Impingement syndrome of shoulder, right 06/24/2017   Dupuytren's contracture of right hand 06/24/2017   Bilateral carpal tunnel syndrome 05/23/2017  Depression, major, in partial remission ([place]) 07/14/2015   RLS (restless legs syndrome) 07/14/2015   Atherosclerosis of native coronary artery with angina pectoris ([place]) 05/10/2015   Musculoskeletal chest pain 05/04/2015   Precordial pain 05/04/2015   Generalized anxiety disorder 03/14/2015   Primary insomnia 03/14/2015   Type 2 diabetes mellitus with diabetic neuropathy, with long-term current use of insulin ([place]) 02/16/2010   Hyperlipidemia LDL goal <70 02/16/2010   Essential hypertension 02/16/2010   GERD 02/16/2010   Bilateral knee pain 02/16/2010    Akiera Allbaugh, PT 07/10/2020, 1:15 PM  [facility] [place], [name], [address] Phone: [phone]   Fax:  [phone]  Name: Tonya Chandler MRN: [id number] Date of Birth: Jun 09, 1960

## 2020-07-13 ENCOUNTER — Ambulatory Visit (INDEPENDENT_AMBULATORY_CARE_PROVIDER_SITE_OTHER): Payer: BC Managed Care – PPO | Admitting: Rehabilitative and Restorative Service Providers"

## 2020-07-13 ENCOUNTER — Other Ambulatory Visit: Payer: Self-pay

## 2020-07-13 DIAGNOSIS — R2689 Other abnormalities of gait and mobility: Secondary | ICD-10-CM

## 2020-07-13 DIAGNOSIS — M6281 Muscle weakness (generalized): Secondary | ICD-10-CM | POA: Diagnosis not present

## 2020-07-13 DIAGNOSIS — M25561 Pain in right knee: Secondary | ICD-10-CM

## 2020-07-13 DIAGNOSIS — R600 Localized edema: Secondary | ICD-10-CM | POA: Diagnosis not present

## 2020-07-13 NOTE — Patient Instructions (Addendum)
Access Code: [email or web address] URL: [email or web address] Date: 07/13/2020 Prepared by: Rudell Cobb  Exercises Supine Active Straight Leg Raise - 2 x daily - 7 x weekly - 1 sets - 10-12 reps - 3 seconds hold Supine Quad Set - 2 x daily - 7 x weekly - 1 sets - 10 reps - 3 seconds hold Sidelying Hip Abduction - 2 x daily - 7 x weekly - 1 sets - 10-12 reps - 3 seconds hold Seated Ankle Pumps - 2 x daily - 7 x weekly - 1 sets - 10 reps Seated Hamstring Set - 2 x daily - 7 x weekly - 1 sets - 10 reps Seated Heel Slide - 2 x daily - 7 x weekly - 1 sets - 10 reps  Patient Education Kinesiology tape

## 2020-07-13 NOTE — Therapy (Addendum)
[place], [name], [address] Phone: [phone]   Fax:  [phone]  Physical Therapy Treatment  Patient Details  Name: Tonya Chandler MRN: [id number] Date of Birth: Sep 20, 1960 Referring Provider (PT): Frederik Pear, MD   Encounter Date: 07/13/2020   PT End of Session - 07/13/20 1149     Visit Number 2    Number of Visits 12    Date for PT Re-Evaluation 08/21/20    Authorization Type BCBS 60 visits/year combined    PT Start Time 1146    PT Stop Time 6962    PT Time Calculation (min) 44 min             Past Medical History:  Diagnosis Date   Abnormal ECG 04/01/2018   Abnormal stress echocardiogram 05/24/2019   Formatting of this note might be different from the original. Added automatically from request for surgery [phone]   Abnormal stress test 05/24/2019   Formatting of this note might be different from the original. Added automatically from request for surgery [id number]   Anemia    Angina pectoris ([place]) 06/04/2019   Anxiety    Atherosclerosis of native coronary artery with angina pectoris ([place]) 05/10/2015   Formatting of this note might be different from the original. 30% lesion-Chiu   Bilateral carpal tunnel syndrome 05/23/2017   Bilateral knee pain 02/16/2010   Qualifier: Diagnosis of  By: Koleen Nimrod MD, Jeffrey     Carpal tunnel syndrome, right 12/16/2017   Colon polyps    Depression    DEPRESSION 02/16/2010   Qualifier: Diagnosis of  By: Koleen Nimrod MD, Dellis Filbert     Depression, major, in partial remission ([place]) 07/14/2015   Diabetes mellitus    Diverticulosis    Dupuytren's contracture of right hand 06/24/2017   Essential hypertension 02/16/2010   Qualifier: Diagnosis of  By: Koleen Nimrod MD, Dellis Filbert     Gastro-esophageal reflux    Gastrointestinal ulcer    Generalized anxiety disorder 03/14/2015   GERD 02/16/2010   Qualifier: Diagnosis of  By: Koleen Nimrod MD, Dellis Filbert     H/O: GI bleed    Said it was due  to taking metformin   Hyperlipidemia    Hyperlipidemia LDL goal <70 02/16/2010   Qualifier: Diagnosis of  By: Koleen Nimrod MD, Dellis Filbert     Hypertension    IBS (irritable bowel syndrome)    Impingement syndrome of shoulder, right 06/24/2017   Midline low back pain with left-sided sciatica 12/29/2013   Musculoskeletal chest pain 05/04/2015   Obesity    Post-nasal drainage 02/10/2020   Last Assessment & Plan:  Formatting of this note might be different from the original. Concern over postnasal drainage. Throat awareness symptoms ever since she was packed in the right nostril last summer.  Was treated with antibiotics with some improvement.  Causes intermittent hoarseness and loss of voice.  Non-smoker. EXAM by anterior rhinoscopy shows no obvious polyps or purulence or obstructi   Precordial pain 05/04/2015   Primary insomnia 03/14/2015   Radiculitis of right cervical region 01/20/2018   Radiculopathy 04/02/2018   Right hand pain 06/24/2017   RLS (restless legs syndrome) 07/14/2015   Type 2 diabetes mellitus with diabetic neuropathy, with long-term current use of insulin ([place]) 02/16/2010   Qualifier: Diagnosis of  By: Koleen Nimrod MD, Jeffrey     Unstable angina [facility]) 06/11/2019    Past Surgical History:  Procedure Laterality Date   ABDOMINAL HYSTERECTOMY     ANTERIOR CERVICAL DECOMP/DISCECTOMY FUSION N/A 04/02/2018  Procedure: ANTERIOR CERVICAL DECOMPRESSION FUSION, CERVICAL FOUR-FIVE, CERVICAL FIVE-SIX, WITH INSTRUMENTATION AND ALLOGRAFT;  Surgeon: Phylliss Bob, MD;  Location: [facility];  Service: Orthopedics;  Laterality: N/A;   COLONOSCOPY WITH ESOPHAGOGASTRODUODENOSCOPY (EGD)  05/04/2009   CORONARY ARTERY BYPASS GRAFT N/A 06/14/2019   Procedure: CORONARY ARTERY BYPASS GRAFTING (CABG) using LIMA to LAD; Endoscopic Right Greater Saphenous Vein Harvest: SVG to Circ (distal); SVG to RCA (distal).;  Surgeon: [name], MD;  Location: [facility] [place];  Service: Open Heart Surgery;  Laterality: N/A;   ENDOVEIN  HARVEST OF GREATER SAPHENOUS VEIN Right 06/14/2019   Procedure: [place] Ropes Of Greater Saphenous Vein;  Surgeon: [name], MD;  Location: [place];  Service: Open Heart Surgery;  Laterality: Right;   LEFT HEART CATH AND CORONARY ANGIOGRAPHY N/A 06/11/2019   Procedure: LEFT HEART CATH AND CORONARY ANGIOGRAPHY;  Surgeon: Wellington Hampshire, MD;  Location: [place] [facility];  Service: Cardiovascular;  Laterality: N/A;   PIP JOINT FUSION Bilateral    pinky   TEE WITHOUT CARDIOVERSION N/A 06/14/2019   Procedure: TRANSESOPHAGEAL ECHOCARDIOGRAM (TEE);  Surgeon: [name], MD;  Location: [place];  Service: Open Heart Surgery;  Laterality: N/A;    There were no vitals filed for this visit.   Subjective Assessment - 07/13/20 1147     Subjective The patient tried to move her work chair this morning and attempted to scoot in and got pain in her knee "I was almost in tears."    Pertinent History CABG 2021, diabetes, neuropathy, HTN    Patient Stated Goals Strengthen knee, no pain, full use of R leg    Currently in Pain? Yes    Pain Score 7     Pain Location Knee    Pain Orientation Right;Posterior    Pain Descriptors / Indicators Aching;Sore;Discomfort    Pain Type Acute pain    Pain Onset 1 to 4 weeks ago    Aggravating Factors  bearing weight    Pain Relieving Factors ice                [facility] PT Assessment - 07/13/20 1153       Assessment   Medical Diagnosis strengthen quad, MCL    Referring Provider (PT) Frederik Pear, MD    Onset Date/Surgical Date 06/27/20    Hand Dominance Left    Next MD Visit 07/20/20      ROM / Strength   AROM / PROM / Strength PROM      AROM   AROM Assessment Site Knee    Right/Left Knee Right    Right Knee Extension -4    Right Knee Flexion 88   AROM in heel slide     PROM   PROM Assessment Site Knee    Right/Left Knee Right    Right Knee Extension -4    Right Knee Flexion 92                           [facility] Adult  PT Treatment/Exercise - 07/13/20 1153       Ambulation/Gait   Ambulation/Gait Yes    Ambulation/Gait Assistance 6: Modified independent (Device/Increase time)    Ambulation Distance (Feet) 100 Feet    Assistive device R Axillary Crutch;L Axillary Crutch    Gait Pattern Step-to pattern      Exercises   Exercises Knee/Hip      Knee/Hip Exercises: Aerobic   Recumbent Bike x 5 minutes  level  1 partial revolutions      Knee/Hip Exercises: Supine   Quad Sets Strengthening;Right;15 reps    Short Arc Quad Sets AROM;Right;1 set;10 reps    Heel Slides AROM;Right;10 reps    Bridges AROM;Strengthening;1 set;Both;10 reps    Other Supine Knee/Hip Exercises ball squeeze supine x 10 reps    Other Supine Knee/Hip Exercises supine marching x 5 reps R side      Manual Therapy   Manual therapy comments Medial knee taping with Medial to lateral I strip to lateral quad, medial I strip o n adductors, and small I strip at tape insertion medially over pes anserine.  Gentle tape used due to fair skin             Vaso on R knee x 10 minutes.            PT Long Term Goals - 07/10/20 1031       PT LONG TERM GOAL #1   Title The patient will be indep with HEP.    Time 6    Period Weeks    Target Date 08/21/20      PT LONG TERM GOAL #2   Title The patient will improve AROM from 14-80 up to 3-110 degrees.    Time 6    Period Weeks    Target Date 08/21/20      PT LONG TERM GOAL #3   Title The patient will improve functional status score from 29 up to 57%.    Time 6    Period Weeks    Target Date 08/21/20      PT LONG TERM GOAL #4   Title The patient will improve R LE strength to tolerate SLR without brace x 15 reps.    Time 6    Period Weeks    Target Date 08/21/20      PT LONG TERM GOAL #5   Title The patient will return to gait without device for home and limited community without increased pain.    Time 6    Period Weeks    Target Date 08/21/20                    Plan - 07/13/20 1206     Clinical Impression Statement The patient is tolerating greater ther ex today with no lag on SLR without brace.  She feels improvement in mobility with therapy today.  She gets pain at end range extension during LAQ described as a catching sensation.  Considered BFR, however patient has h/o CABG so recommend working on strengthening without BFR at this time.    Personal Factors and Comorbidities --    Comorbidities CABG, diabetes, neuropathy    Examination-Activity Limitations --    Stability/Clinical Decision Making --    Rehab Potential --    PT Frequency --    PT Duration --    PT Treatment/Interventions ADLs/Self Care Home Management;Dry needling;Manual techniques;Gait training;Stair training;Vasopneumatic Device;Aquatic Therapy;Electrical Stimulation;Cryotherapy;Functional mobility training;Therapeutic activities;Therapeutic exercise;Balance training;Neuromuscular re-education;Patient/family education;Passive range of motion;Taping    PT Next Visit Plan check HEP, progress hip/knee/ankle strengthening to tolerance; begin to slowly progress to WBAT with crutches, flexibility LEs.  Hold BFR due to h/o CABG.    PT Home Exercise Plan Access Code: [email or web address]    Consulted and Agree with Plan of Care Patient             Patient will benefit from skilled therapeutic intervention in order to improve the following deficits and impairments:  Pain, Hypomobility, Impaired flexibility, Decreased strength, Decreased range of motion, Difficulty walking, Increased fascial restricitons, Impaired tone, Decreased activity tolerance, Postural dysfunction, Increased edema  Visit Diagnosis: Acute pain of right knee  Muscle weakness (generalized)  Other abnormalities of gait and mobility  Localized edema     Problem List Patient Active Problem List   Diagnosis Date Noted   Obesity    IBS (irritable bowel syndrome)    Hypertension    Hyperlipidemia     H/O: GI bleed    Gastrointestinal ulcer    Gastro-esophageal reflux    Diverticulosis    Depression    Colon polyps    Anxiety    Anemia    Post-nasal drainage 02/10/2020   S/P CABG x 3 LIMA to LAD, SVG sequential to RCA and circumflex in May 2021    Unstable angina ([place]) 06/11/2019   Angina pectoris ([place]) 06/04/2019   Abnormal stress test 05/24/2019   Abnormal stress echocardiogram 05/24/2019   Radiculopathy 04/02/2018   Abnormal ECG 04/01/2018   Radiculitis of right cervical region 01/20/2018   Carpal tunnel syndrome, right 12/16/2017   Right hand pain 06/24/2017   Impingement syndrome of shoulder, right 06/24/2017   Dupuytren's contracture of right hand 06/24/2017   Bilateral carpal tunnel syndrome 05/23/2017   Depression, major, in partial remission ([place]) 07/14/2015   RLS (restless legs syndrome) 07/14/2015   Atherosclerosis of native coronary artery with angina pectoris ([place]) 05/10/2015   Musculoskeletal chest pain 05/04/2015   Precordial pain 05/04/2015   Generalized anxiety disorder 03/14/2015   Primary insomnia 03/14/2015   Type 2 diabetes mellitus with diabetic neuropathy, with long-term current use of insulin ([place]) 02/16/2010   Hyperlipidemia LDL goal <70 02/16/2010   Essential hypertension 02/16/2010   GERD 02/16/2010   Bilateral knee pain 02/16/2010    Sherie Dobrowolski, PT 07/13/2020, 12:57 PM  [facility]  [place], [name], [address] Phone: [phone]   Fax:  [phone]  Name: Tonya Chandler MRN: [id number] Date of Birth: 1960-01-29

## 2020-07-18 ENCOUNTER — Ambulatory Visit (INDEPENDENT_AMBULATORY_CARE_PROVIDER_SITE_OTHER): Payer: BC Managed Care – PPO | Admitting: Physical Therapy

## 2020-07-18 ENCOUNTER — Other Ambulatory Visit: Payer: Self-pay

## 2020-07-18 DIAGNOSIS — M25561 Pain in right knee: Secondary | ICD-10-CM | POA: Diagnosis not present

## 2020-07-18 DIAGNOSIS — R2689 Other abnormalities of gait and mobility: Secondary | ICD-10-CM

## 2020-07-18 DIAGNOSIS — R600 Localized edema: Secondary | ICD-10-CM

## 2020-07-18 DIAGNOSIS — M6281 Muscle weakness (generalized): Secondary | ICD-10-CM

## 2020-07-18 NOTE — Therapy (Signed)
[place], [name], [address] Phone: [phone]   Fax:  [phone]  Physical Therapy Treatment  Patient Details  Name: Tonya Chandler MRN: [id number] Date of Birth: October 22, 1960 Referring Provider (PT): Frederik Pear, MD   Encounter Date: 07/18/2020   PT End of Session - 07/18/20 1129     Visit Number 3    Number of Visits 12    Date for PT Re-Evaluation 08/21/20    Authorization Type [employer] 60 visits/year combined    PT Start Time 1103    PT Stop Time 1152    PT Time Calculation (min) 49 min    Behavior During Therapy [facility] for tasks assessed/performed             Past Medical History:  Diagnosis Date   Abnormal ECG 04/01/2018   Abnormal stress echocardiogram 05/24/2019   Formatting of this note might be different from the original. Added automatically from request for surgery [id number]   Abnormal stress test 05/24/2019   Formatting of this note might be different from the original. Added automatically from request for surgery [id number]   Anemia    Angina pectoris ([place]) 06/04/2019   Anxiety    Atherosclerosis of native coronary artery with angina pectoris ([place]) 05/10/2015   Formatting of this note might be different from the original. 30% lesion-Chiu   Bilateral carpal tunnel syndrome 05/23/2017   Bilateral knee pain 02/16/2010   Qualifier: Diagnosis of  By: Koleen Nimrod MD, Jeffrey     Carpal tunnel syndrome, right 12/16/2017   Colon polyps    Depression    DEPRESSION 02/16/2010   Qualifier: Diagnosis of  By: Koleen Nimrod MD, Dellis Filbert     Depression, major, in partial remission ([place]) 07/14/2015   Diabetes mellitus    Diverticulosis    Dupuytren's contracture of right hand 06/24/2017   Essential hypertension 02/16/2010   Qualifier: Diagnosis of  By: Koleen Nimrod MD, Dellis Filbert     Gastro-esophageal reflux    Gastrointestinal ulcer    Generalized anxiety disorder 03/14/2015   GERD 02/16/2010   Qualifier: Diagnosis of  By:  Koleen Nimrod MD, Dellis Filbert     H/O: GI bleed    Said it was due to taking metformin   Hyperlipidemia    Hyperlipidemia LDL goal <70 02/16/2010   Qualifier: Diagnosis of  By: Koleen Nimrod MD, Dellis Filbert     Hypertension    IBS (irritable bowel syndrome)    Impingement syndrome of shoulder, right 06/24/2017   Midline low back pain with left-sided sciatica 12/29/2013   Musculoskeletal chest pain 05/04/2015   Obesity    Post-nasal drainage 02/10/2020   Last Assessment & Plan:  Formatting of this note might be different from the original. Concern over postnasal drainage. Throat awareness symptoms ever since she was packed in the right nostril last summer.  Was treated with antibiotics with some improvement.  Causes intermittent hoarseness and loss of voice.  Non-smoker. EXAM by anterior rhinoscopy shows no obvious polyps or purulence or obstructi   Precordial pain 05/04/2015   Primary insomnia 03/14/2015   Radiculitis of right cervical region 01/20/2018   Radiculopathy 04/02/2018   Right hand pain 06/24/2017   RLS (restless legs syndrome) 07/14/2015   Type 2 diabetes mellitus with diabetic neuropathy, with long-term current use of insulin ([place]) 02/16/2010   Qualifier: Diagnosis of  By: Koleen Nimrod MD, Jeffrey     Unstable angina [facility]) 06/11/2019    Past Surgical History:  Procedure Laterality Date   ABDOMINAL HYSTERECTOMY  ANTERIOR CERVICAL DECOMP/DISCECTOMY FUSION N/A 04/02/2018   Procedure: ANTERIOR CERVICAL DECOMPRESSION FUSION, CERVICAL FOUR-FIVE, CERVICAL FIVE-SIX, WITH INSTRUMENTATION AND ALLOGRAFT;  Surgeon: Phylliss Bob, MD;  Location: [facility];  Service: Orthopedics;  Laterality: N/A;   COLONOSCOPY WITH ESOPHAGOGASTRODUODENOSCOPY (EGD)  05/04/2009   CORONARY ARTERY BYPASS GRAFT N/A 06/14/2019   Procedure: CORONARY ARTERY BYPASS GRAFTING (CABG) using LIMA to LAD; Endoscopic Right Greater Saphenous Vein Harvest: SVG to Circ (distal); SVG to RCA (distal).;  Surgeon: [name], MD;  Location: [place];   Service: Open Heart Surgery;  Laterality: N/A;   ENDOVEIN HARVEST OF GREATER SAPHENOUS VEIN Right 06/14/2019   Procedure: [place] Ropes Of Greater Saphenous Vein;  Surgeon: [name], MD;  Location: [place];  Service: Open Heart Surgery;  Laterality: Right;   LEFT HEART CATH AND CORONARY ANGIOGRAPHY N/A 06/11/2019   Procedure: LEFT HEART CATH AND CORONARY ANGIOGRAPHY;  Surgeon: Wellington Hampshire, MD;  Location: [place] [facility];  Service: Cardiovascular;  Laterality: N/A;   PIP JOINT FUSION Bilateral    pinky   TEE WITHOUT CARDIOVERSION N/A 06/14/2019   Procedure: TRANSESOPHAGEAL ECHOCARDIOGRAM (TEE);  Surgeon: [name], MD;  Location: [place];  Service: Open Heart Surgery;  Laterality: N/A;    There were no vitals filed for this visit.   Subjective Assessment - 07/18/20 1315     Subjective Pt ambulates into session with single axillary crutch under her Rt arm (brace on Rt knee is donned).  She states her Rt knee sometimes buckles when she walks.  She feels she needs to walk on Rt leg with knee bent due to pain.    Pertinent History CABG 2021, diabetes, neuropathy, HTN    Patient Stated Goals Strengthen knee, no pain, full use of R leg    Currently in Pain? Yes    Pain Score 4    up to 8/10 with ROM   Pain Location Knee    Pain Orientation Right;Medial;Posterior    Pain Descriptors / Indicators Aching;Sore;Sharp    Pain Onset 1 to 4 weeks ago    Aggravating Factors  weight bearing    Pain Relieving Factors ice, rest                [facility] PT Assessment - 07/18/20 0001       Assessment   Medical Diagnosis strengthen quad, MCL    Referring Provider (PT) Frederik Pear, MD    Onset Date/Surgical Date 06/27/20    Hand Dominance Left    Next MD Visit 07/20/20      AROM   Right Knee Extension -3   supine with quad set     PROM   Right/Left Knee Right    Right Knee Extension --    Right Knee Flexion 105   AAROM heel slide                           [facility] Adult PT Treatment/Exercise - 07/18/20 0001       Ambulation/Gait   Ambulation Distance (Feet) 100 Feet    Assistive device R Axillary Crutch   and trial under LUE; not tolerated due to Lt shoulder pain.   Gait Pattern Step-through pattern;Decreased step length - left;Decreased stance time - right;Decreased dorsiflexion - right;Decreased weight shift to right;Right flexed knee in stance;Antalgic    Pre-Gait Activities cues for increased stride with Lt foot, for placement of crutch (too far away), increase DF at heel strike and  staighten knee at stance phase.      Self-Care   Self-Care Other Self-Care Comments    Other Self-Care Comments  pt instructed in self massage with roller stick to Rt LE musculature to decrease fascial tightness and improve ROM at knee. Pt returned demo with cues.      Knee/Hip Exercises: Stretches   Passive Hamstring Stretch Right;2 reps;20 seconds   supine with strap   Gastroc Stretch Right;2 reps;10 seconds   supine with strap   Other Knee/Hip Stretches supne adductor stretch x 3 reps of 15 sec with strap      Knee/Hip Exercises: Aerobic   Recumbent Bike x 5 min, partial revolultions for ROM      Knee/Hip Exercises: Standing   Other Standing Knee Exercises weight shifts with HHA from therapist, cues for straight knee x 5 reps      Knee/Hip Exercises: Supine   Quad Sets Strengthening;Right;1 set;5 reps    Heel Slides AAROM;Right;1 set;10 reps    Straight Leg Raises Strengthening;Right;1 set;10 reps   cues for DF     Knee/Hip Exercises: Prone   Hamstring Curl 5 reps   after prone hang   Prone Knee Hang 2 minutes   feet off of end of table, with ankle pumps.   Other Prone Exercises Rt TKE x 3-5 sec x 12 reps (improved tolerance with cues to ease in/out of motion)      Modalities   Modalities Vasopneumatic      Vasopneumatic   Number Minutes Vasopneumatic  10 minutes    Vasopnuematic Location  Knee   Rt    Vasopneumatic Pressure Low    Vasopneumatic Temperature  34      Manual Therapy   Manual therapy comments Sensitive skin Rock tape applied in x pattern over Rt pes anserine with 25% stretch to decompress tissue and increase proprioception.                         PT Long Term Goals - 07/18/20 1322       PT LONG TERM GOAL #1   Title The patient will be indep with HEP.    Time 6    Period Weeks    Status On-going      PT LONG TERM GOAL #2   Title The patient will improve AROM from 14-80 up to 3-110 degrees.    Time 6    Period Weeks    Status Partially Met      PT LONG TERM GOAL #3   Title The patient will improve functional status score from 29 up to 57%.    Time 6    Period Weeks    Status On-going      PT LONG TERM GOAL #4   Title The patient will improve R LE strength to tolerate SLR without brace x 15 reps.    Time 6    Period Weeks    Status Partially Met      PT LONG TERM GOAL #5   Title The patient will return to gait without device for home and limited community without increased pain.    Time 6    Period Weeks    Status On-going                   Plan - 07/18/20 1148     Clinical Impression Statement Improving Rt knee ROM.  Rt leg buckled during short gait requiring min A from therapist  to regain upright posture.  She continues to have pain in medial Rt knee, increased with end ranges of knee ext/ flexion.  she tolerates prone quad set better than supine. Gait quality improved at end of session, with increased heel strike and more knee ext during stance.Encouraged use of both crutches until more steady with gait.   Pt reported she was painfree by end of session.    Comorbidities CABG, diabetes, neuropathy    Rehab Potential Good    PT Frequency 2x / week    PT Duration 6 weeks    PT Treatment/Interventions ADLs/Self Care Home Management;Dry needling;Manual techniques;Gait training;Stair training;Vasopneumatic Device;Aquatic  Therapy;Electrical Stimulation;Cryotherapy;Functional mobility training;Therapeutic activities;Therapeutic exercise;Balance training;Neuromuscular re-education;Patient/family education;Passive range of motion;Taping    PT Next Visit Plan check HEP, progress hip/knee/ankle strengthening to tolerance; begin to slowly progress to WBAT with crutches, flexibility LEs.  Hold BFR due to h/o CABG.    PT Home Exercise Plan Access Code: [email or web address]    Consulted and Agree with Plan of Care Patient             Patient will benefit from skilled therapeutic intervention in order to improve the following deficits and impairments:  Pain, Hypomobility, Impaired flexibility, Decreased strength, Decreased range of motion, Difficulty walking, Increased fascial restricitons, Impaired tone, Decreased activity tolerance, Postural dysfunction, Increased edema  Visit Diagnosis: Acute pain of right knee  Muscle weakness (generalized)  Other abnormalities of gait and mobility  Localized edema     Problem List Patient Active Problem List   Diagnosis Date Noted   Obesity    IBS (irritable bowel syndrome)    Hypertension    Hyperlipidemia    H/O: GI bleed    Gastrointestinal ulcer    Gastro-esophageal reflux    Diverticulosis    Depression    Colon polyps    Anxiety    Anemia    Post-nasal drainage 02/10/2020   S/P CABG x 3 LIMA to LAD, SVG sequential to RCA and circumflex in May 2021    Unstable angina ([place]) 06/11/2019   Angina pectoris ([place]) 06/04/2019   Abnormal stress test 05/24/2019   Abnormal stress echocardiogram 05/24/2019   Radiculopathy 04/02/2018   Abnormal ECG 04/01/2018   Radiculitis of right cervical region 01/20/2018   Carpal tunnel syndrome, right 12/16/2017   Right hand pain 06/24/2017   Impingement syndrome of shoulder, right 06/24/2017   Dupuytren's contracture of right hand 06/24/2017   Bilateral carpal tunnel syndrome 05/23/2017   Depression, major, in partial remission  ([place]) 07/14/2015   RLS (restless legs syndrome) 07/14/2015   Atherosclerosis of native coronary artery with angina pectoris ([place]) 05/10/2015   Musculoskeletal chest pain 05/04/2015   Precordial pain 05/04/2015   Generalized anxiety disorder 03/14/2015   Primary insomnia 03/14/2015   Type 2 diabetes mellitus with diabetic neuropathy, with long-term current use of insulin ([place]) 02/16/2010   Hyperlipidemia LDL goal <70 02/16/2010   Essential hypertension 02/16/2010   GERD 02/16/2010   Bilateral knee pain 02/16/2010    Shelbie Hutching 07/18/2020, 1:23 PM  [facility] [place], [name], [address] Phone: [phone]   Fax:  [phone]  Name: Tonya Chandler MRN: [id number] Date of Birth: 1960/02/04

## 2020-07-20 ENCOUNTER — Ambulatory Visit (INDEPENDENT_AMBULATORY_CARE_PROVIDER_SITE_OTHER): Payer: BC Managed Care – PPO | Admitting: Physical Therapy

## 2020-07-20 ENCOUNTER — Other Ambulatory Visit: Payer: Self-pay

## 2020-07-20 DIAGNOSIS — S8391XA Sprain of unspecified site of right knee, initial encounter: Secondary | ICD-10-CM | POA: Diagnosis not present

## 2020-07-20 DIAGNOSIS — R2689 Other abnormalities of gait and mobility: Secondary | ICD-10-CM

## 2020-07-20 DIAGNOSIS — M6281 Muscle weakness (generalized): Secondary | ICD-10-CM

## 2020-07-20 DIAGNOSIS — M25561 Pain in right knee: Secondary | ICD-10-CM | POA: Diagnosis not present

## 2020-07-20 DIAGNOSIS — R600 Localized edema: Secondary | ICD-10-CM | POA: Diagnosis not present

## 2020-07-20 DIAGNOSIS — S83511A Sprain of anterior cruciate ligament of right knee, initial encounter: Secondary | ICD-10-CM | POA: Diagnosis not present
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
## 2020-07-25 ENCOUNTER — Ambulatory Visit (INDEPENDENT_AMBULATORY_CARE_PROVIDER_SITE_OTHER): Payer: BC Managed Care – PPO | Admitting: Rehabilitative and Restorative Service Providers"

## 2020-07-25 ENCOUNTER — Other Ambulatory Visit: Payer: Self-pay

## 2020-07-25 DIAGNOSIS — R2689 Other abnormalities of gait and mobility: Secondary | ICD-10-CM | POA: Diagnosis not present

## 2020-07-25 DIAGNOSIS — M25561 Pain in right knee: Secondary | ICD-10-CM

## 2020-07-25 DIAGNOSIS — M6281 Muscle weakness (generalized): Secondary | ICD-10-CM

## 2020-07-25 DIAGNOSIS — R600 Localized edema: Secondary | ICD-10-CM | POA: Diagnosis not present
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
## 2020-07-27 ENCOUNTER — Other Ambulatory Visit: Payer: Self-pay

## 2020-07-27 ENCOUNTER — Ambulatory Visit (INDEPENDENT_AMBULATORY_CARE_PROVIDER_SITE_OTHER): Payer: BC Managed Care – PPO | Admitting: Rehabilitative and Restorative Service Providers"

## 2020-07-27 DIAGNOSIS — M25561 Pain in right knee: Secondary | ICD-10-CM

## 2020-07-27 DIAGNOSIS — R2689 Other abnormalities of gait and mobility: Secondary | ICD-10-CM

## 2020-07-27 DIAGNOSIS — R600 Localized edema: Secondary | ICD-10-CM

## 2020-07-27 DIAGNOSIS — M6281 Muscle weakness (generalized): Secondary | ICD-10-CM | POA: Diagnosis not present

## 2020-07-27 NOTE — Therapy (Signed)
[place], [name], [address] Phone: [phone]   Fax:  [phone]  Physical Therapy Treatment  Patient Details  Name: Tonya Chandler MRN: [id number] Date of Birth: September 16, 1960 Referring Provider (PT): Frederik Pear, MD   Encounter Date: 07/27/2020   PT End of Session - 07/27/20 1109     Visit Number 6    Number of Visits 12    Date for PT Re-Evaluation 08/21/20    Authorization Type [employer] 60 visits/year combined    PT Start Time 1105    PT Stop Time 4332    PT Time Calculation (min) 40 min    Activity Tolerance Patient tolerated treatment well    Behavior During Therapy [facility] for tasks assessed/performed             Past Medical History:  Diagnosis Date   Abnormal ECG 04/01/2018   Abnormal stress echocardiogram 05/24/2019   Formatting of this note might be different from the original. Added automatically from request for surgery [phone]   Abnormal stress test 05/24/2019   Formatting of this note might be different from the original. Added automatically from request for surgery [id number]   Anemia    Angina pectoris ([place]) 06/04/2019   Anxiety    Atherosclerosis of native coronary artery with angina pectoris ([place]) 05/10/2015   Formatting of this note might be different from the original. 30% lesion-Chiu   Bilateral carpal tunnel syndrome 05/23/2017   Bilateral knee pain 02/16/2010   Qualifier: Diagnosis of  By: Koleen Nimrod MD, Jeffrey     Carpal tunnel syndrome, right 12/16/2017   Colon polyps    Depression    DEPRESSION 02/16/2010   Qualifier: Diagnosis of  By: Koleen Nimrod MD, Dellis Filbert     Depression, major, in partial remission ([place]) 07/14/2015   Diabetes mellitus    Diverticulosis    Dupuytren's contracture of right hand 06/24/2017   Essential hypertension 02/16/2010   Qualifier: Diagnosis of  By: Koleen Nimrod MD, Dellis Filbert     Gastro-esophageal reflux    Gastrointestinal ulcer    Generalized anxiety disorder  03/14/2015   GERD 02/16/2010   Qualifier: Diagnosis of  By: Koleen Nimrod MD, Dellis Filbert     H/O: GI bleed    Said it was due to taking metformin   Hyperlipidemia    Hyperlipidemia LDL goal <70 02/16/2010   Qualifier: Diagnosis of  By: Koleen Nimrod MD, Dellis Filbert     Hypertension    IBS (irritable bowel syndrome)    Impingement syndrome of shoulder, right 06/24/2017   Midline low back pain with left-sided sciatica 12/29/2013   Musculoskeletal chest pain 05/04/2015   Obesity    Post-nasal drainage 02/10/2020   Last Assessment & Plan:  Formatting of this note might be different from the original. Concern over postnasal drainage. Throat awareness symptoms ever since she was packed in the right nostril last summer.  Was treated with antibiotics with some improvement.  Causes intermittent hoarseness and loss of voice.  Non-smoker. EXAM by anterior rhinoscopy shows no obvious polyps or purulence or obstructi   Precordial pain 05/04/2015   Primary insomnia 03/14/2015   Radiculitis of right cervical region 01/20/2018   Radiculopathy 04/02/2018   Right hand pain 06/24/2017   RLS (restless legs syndrome) 07/14/2015   Type 2 diabetes mellitus with diabetic neuropathy, with long-term current use of insulin ([place]) 02/16/2010   Qualifier: Diagnosis of  By: Koleen Nimrod MD, Jeffrey     Unstable angina [facility]) 06/11/2019    Past Surgical History:  Procedure Laterality Date   ABDOMINAL HYSTERECTOMY     ANTERIOR CERVICAL DECOMP/DISCECTOMY FUSION N/A 04/02/2018   Procedure: ANTERIOR CERVICAL DECOMPRESSION FUSION, CERVICAL FOUR-FIVE, CERVICAL FIVE-SIX, WITH INSTRUMENTATION AND ALLOGRAFT;  Surgeon: Phylliss Bob, MD;  Location: [place];  Service: Orthopedics;  Laterality: N/A;   COLONOSCOPY WITH ESOPHAGOGASTRODUODENOSCOPY (EGD)  05/04/2009   CORONARY ARTERY BYPASS GRAFT N/A 06/14/2019   Procedure: CORONARY ARTERY BYPASS GRAFTING (CABG) using LIMA to LAD; Endoscopic Right Greater Saphenous Vein Harvest: SVG to Circ (distal); SVG to RCA  (distal).;  Surgeon: [name], MD;  Location: [place];  Service: Open Heart Surgery;  Laterality: N/A;   ENDOVEIN HARVEST OF GREATER SAPHENOUS VEIN Right 06/14/2019   Procedure: [place] Of Greater Saphenous Vein;  Surgeon: [name], MD;  Location: [place];  Service: Open Heart Surgery;  Laterality: Right;   LEFT HEART CATH AND CORONARY ANGIOGRAPHY N/A 06/11/2019   Procedure: LEFT HEART CATH AND CORONARY ANGIOGRAPHY;  Surgeon: Wellington Hampshire, MD;  Location: [place] [facility];  Service: Cardiovascular;  Laterality: N/A;   PIP JOINT FUSION Bilateral    pinky   TEE WITHOUT CARDIOVERSION N/A 06/14/2019   Procedure: TRANSESOPHAGEAL ECHOCARDIOGRAM (TEE);  Surgeon: [name], MD;  Location: [place];  Service: Open Heart Surgery;  Laterality: N/A;    There were no vitals filed for this visit.   Subjective Assessment - 07/27/20 1108     Subjective The patient reports the cane is going well and she has less swelling since switching from the crutches ot the cane.    Pertinent History CABG 2021, diabetes, neuropathy, HTN    Patient Stated Goals Strengthen knee, no pain, full use of R leg    Currently in Pain? Yes    Pain Score 2     Pain Location Knee    Pain Orientation Medial    Pain Descriptors / Indicators Aching;Sore    Pain Type Acute pain    Pain Onset 1 to 4 weeks ago    Pain Frequency Intermittent    Aggravating Factors  standing, full extension    Pain Relieving Factors ice, rest                [facility] PT Assessment - 07/27/20 1111       AROM   Right Knee Extension -3    Right Knee Flexion 110                           [facility] Adult PT Treatment/Exercise - 07/27/20 1111       Ambulation/Gait   Ambulation/Gait Yes    Ambulation/Gait Assistance 6: Modified independent (Device/Increase time)    Ambulation Distance (Feet) 150 Feet    Assistive device Straight cane    Pre-Gait Activities standing rocking ant/posterior in stride  position      Self-Care   Self-Care Other Self-Care Comments    Other Self-Care Comments  STM with muscle roller      Exercises   Exercises Knee/Hip      Knee/Hip Exercises: Stretches   Passive Hamstring Stretch Right;1 rep;20 seconds    Gastroc Stretch Right;Left;1 rep;30 seconds    Other Knee/Hip Stretches supine hip adductor stretch      Knee/Hip Exercises: Aerobic   Recumbent Bike x 5 minutes full revolutions no resistance      Knee/Hip Exercises: Standing   Wall Squat 10 reps    Wall Squat Limitations mini wall squat with brace donned  SLS L leg x 10 seconds, R leg near wall with brace donned x 10 seconds      Knee/Hip Exercises: Seated   Sit to Sand 5 reps   brace donned     Knee/Hip Exercises: Supine   Straight Leg Raises Strengthening;Right;10 reps    Straight Leg Raise with External Rotation Strengthening;Right;5 reps    Patellar Mobs med/lat and superior grade I-II    Other Supine Knee/Hip Exercises supine hip -adductor isometric x 5 seconds x 10 reps and heel digs into ball for HS isometrics      Knee/Hip Exercises: Prone   Hamstring Curl 1 set;10 reps    Other Prone Exercises hip extension with knee flexed to 90 degrees      Modalities   Modalities Cryotherapy      Cryotherapy   Number Minutes Cryotherapy 10 Minutes   at end of session-no charge   Cryotherapy Location Knee    Type of Cryotherapy Ice pack      Manual Therapy   Manual Therapy Soft tissue mobilization    Manual therapy comments patellar mobilization    Soft tissue mobilization STM HS and hip adductors and quads                         PT Long Term Goals - 07/27/20 1330       PT LONG TERM GOAL #1   Title The patient will be indep with HEP.    Time 6    Period Weeks    Status On-going      PT LONG TERM GOAL #2   Title The patient will improve AROM from 14-80 up to 3-110 degrees.    Baseline 30110 on 07/27/20    Time 6    Period Weeks    Status Achieved      PT  LONG TERM GOAL #3   Title The patient will improve functional status score from 29 up to 57%.    Time 6    Period Weeks    Status On-going      PT LONG TERM GOAL #4   Title The patient will improve R LE strength to tolerate SLR without brace x 15 reps.    Baseline performed without weight    Time 6    Period Weeks    Status Achieved      PT LONG TERM GOAL #5   Title The patient will return to gait without device for home and limited community without increased pain.    Time 6    Period Weeks    Status On-going                   Plan - 07/27/20 1331     Clinical Impression Statement The patient feels walking with cane is imporving her mobility in home and community.  PT progressing strengthening to patient tolerance continuing with brace in standing and no brace for supine/prone exercises.    PT Treatment/Interventions ADLs/Self Care Home Management;Dry needling;Manual techniques;Gait training;Stair training;Vasopneumatic Device;Aquatic Therapy;Electrical Stimulation;Cryotherapy;Functional mobility training;Therapeutic activities;Therapeutic exercise;Balance training;Neuromuscular re-education;Patient/family education;Passive range of motion;Taping    PT Next Visit Plan check HEP, progress hip/knee/ankle strengthening to tolerance; begin to slowly progress to WBAT with crutches, flexibility LEs.  Hold BFR due to h/o CABG.    PT Home Exercise Plan Access Code: [email or web address]    Consulted and Agree with Plan of Care Patient  Patient will benefit from skilled therapeutic intervention in order to improve the following deficits and impairments:     Visit Diagnosis: Acute pain of right knee  Muscle weakness (generalized)  Other abnormalities of gait and mobility  Localized edema     Problem List Patient Active Problem List   Diagnosis Date Noted   Obesity    IBS (irritable bowel syndrome)    Hypertension    Hyperlipidemia    H/O: GI bleed     Gastrointestinal ulcer    Gastro-esophageal reflux    Diverticulosis    Depression    Colon polyps    Anxiety    Anemia    Post-nasal drainage 02/10/2020   S/P CABG x 3 LIMA to LAD, SVG sequential to RCA and circumflex in May 2021    Unstable angina ([place]) 06/11/2019   Angina pectoris ([place]) 06/04/2019   Abnormal stress test 05/24/2019   Abnormal stress echocardiogram 05/24/2019   Radiculopathy 04/02/2018   Abnormal ECG 04/01/2018   Radiculitis of right cervical region 01/20/2018   Carpal tunnel syndrome, right 12/16/2017   Right hand pain 06/24/2017   Impingement syndrome of shoulder, right 06/24/2017   Dupuytren's contracture of right hand 06/24/2017   Bilateral carpal tunnel syndrome 05/23/2017   Depression, major, in partial remission ([place]) 07/14/2015   RLS (restless legs syndrome) 07/14/2015   Atherosclerosis of native coronary artery with angina pectoris ([place]) 05/10/2015   Musculoskeletal chest pain 05/04/2015   Precordial pain 05/04/2015   Generalized anxiety disorder 03/14/2015   Primary insomnia 03/14/2015   Type 2 diabetes mellitus with diabetic neuropathy, with long-term current use of insulin ([place]) 02/16/2010   Hyperlipidemia LDL goal <70 02/16/2010   Essential hypertension 02/16/2010   GERD 02/16/2010   Bilateral knee pain 02/16/2010    [place], PT 07/27/2020, 1:35 PM  [facility] [place], [name], [address] Phone: [phone]   Fax:  [phone]  Name: Carianna Lague MRN: [id number] Date of Birth: 04/05/1960

## 2020-07-31 ENCOUNTER — Ambulatory Visit (INDEPENDENT_AMBULATORY_CARE_PROVIDER_SITE_OTHER): Payer: BC Managed Care – PPO | Admitting: Physical Therapy

## 2020-07-31 ENCOUNTER — Other Ambulatory Visit: Payer: Self-pay

## 2020-07-31 DIAGNOSIS — R2689 Other abnormalities of gait and mobility: Secondary | ICD-10-CM

## 2020-07-31 DIAGNOSIS — M6281 Muscle weakness (generalized): Secondary | ICD-10-CM

## 2020-07-31 DIAGNOSIS — R600 Localized edema: Secondary | ICD-10-CM

## 2020-07-31 DIAGNOSIS — M25561 Pain in right knee: Secondary | ICD-10-CM

## 2020-07-31 NOTE — Therapy (Signed)
[place], [name], [address] Phone: [phone]   Fax:  [phone]  Physical Therapy Treatment  Patient Details  Name: Tonya Chandler MRN: [id number] Date of Birth: 02/26/1960 Referring Provider (PT): Frederik Pear, MD   Encounter Date: 07/31/2020   PT End of Session - 07/31/20 1023     Visit Number 7    Number of Visits 12    Date for PT Re-Evaluation 08/21/20    Authorization Type BCBS 60 visits/year combined    PT Start Time 1018    PT Stop Time 1100    PT Time Calculation (min) 42 min    Equipment Utilized During Treatment --   Rt knee brace during standing exercises.   Activity Tolerance Patient tolerated treatment well    Behavior During Therapy [facility] for tasks assessed/performed             Past Medical History:  Diagnosis Date   Abnormal ECG 04/01/2018   Abnormal stress echocardiogram 05/24/2019   Formatting of this note might be different from the original. Added automatically from request for surgery [id number]   Abnormal stress test 05/24/2019   Formatting of this note might be different from the original. Added automatically from request for surgery [id number]   Anemia    Angina pectoris ([place]) 06/04/2019   Anxiety    Atherosclerosis of native coronary artery with angina pectoris ([place]) 05/10/2015   Formatting of this note might be different from the original. 30% lesion-Chiu   Bilateral carpal tunnel syndrome 05/23/2017   Bilateral knee pain 02/16/2010   Qualifier: Diagnosis of  By: Koleen Nimrod MD, Jeffrey     Carpal tunnel syndrome, right 12/16/2017   Colon polyps    Depression    DEPRESSION 02/16/2010   Qualifier: Diagnosis of  By: Koleen Nimrod MD, Dellis Filbert     Depression, major, in partial remission ([place]) 07/14/2015   Diabetes mellitus    Diverticulosis    Dupuytren's contracture of right hand 06/24/2017   Essential hypertension 02/16/2010   Qualifier: Diagnosis of  By: Koleen Nimrod MD, Dellis Filbert      Gastro-esophageal reflux    Gastrointestinal ulcer    Generalized anxiety disorder 03/14/2015   GERD 02/16/2010   Qualifier: Diagnosis of  By: Koleen Nimrod MD, Dellis Filbert     H/O: GI bleed    Said it was due to taking metformin   Hyperlipidemia    Hyperlipidemia LDL goal <70 02/16/2010   Qualifier: Diagnosis of  By: Koleen Nimrod MD, Dellis Filbert     Hypertension    IBS (irritable bowel syndrome)    Impingement syndrome of shoulder, right 06/24/2017   Midline low back pain with left-sided sciatica 12/29/2013   Musculoskeletal chest pain 05/04/2015   Obesity    Post-nasal drainage 02/10/2020   Last Assessment & Plan:  Formatting of this note might be different from the original. Concern over postnasal drainage. Throat awareness symptoms ever since she was packed in the right nostril last summer.  Was treated with antibiotics with some improvement.  Causes intermittent hoarseness and loss of voice.  Non-smoker. EXAM by anterior rhinoscopy shows no obvious polyps or purulence or obstructi   Precordial pain 05/04/2015   Primary insomnia 03/14/2015   Radiculitis of right cervical region 01/20/2018   Radiculopathy 04/02/2018   Right hand pain 06/24/2017   RLS (restless legs syndrome) 07/14/2015   Type 2 diabetes mellitus with diabetic neuropathy, with long-term current use of insulin ([place]) 02/16/2010   Qualifier: Diagnosis of  By: Koleen Nimrod MD,  Dellis Filbert     Unstable angina ([place]) 06/11/2019    Past Surgical History:  Procedure Laterality Date   ABDOMINAL HYSTERECTOMY     ANTERIOR CERVICAL DECOMP/DISCECTOMY FUSION N/A 04/02/2018   Procedure: ANTERIOR CERVICAL DECOMPRESSION FUSION, CERVICAL FOUR-FIVE, CERVICAL FIVE-SIX, WITH INSTRUMENTATION AND ALLOGRAFT;  Surgeon: Phylliss Bob, MD;  Location: [place];  Service: Orthopedics;  Laterality: N/A;   COLONOSCOPY WITH ESOPHAGOGASTRODUODENOSCOPY (EGD)  05/04/2009   CORONARY ARTERY BYPASS GRAFT N/A 06/14/2019   Procedure: CORONARY ARTERY BYPASS GRAFTING (CABG) using LIMA to LAD;  Endoscopic Right Greater Saphenous Vein Harvest: SVG to Circ (distal); SVG to RCA (distal).;  Surgeon: [name], MD;  Location: [place];  Service: Open Heart Surgery;  Laterality: N/A;   ENDOVEIN HARVEST OF GREATER SAPHENOUS VEIN Right 06/14/2019   Procedure: [place] Of Greater Saphenous Vein;  Surgeon: [name], MD;  Location: [place];  Service: Open Heart Surgery;  Laterality: Right;   LEFT HEART CATH AND CORONARY ANGIOGRAPHY N/A 06/11/2019   Procedure: LEFT HEART CATH AND CORONARY ANGIOGRAPHY;  Surgeon: Wellington Hampshire, MD;  Location: [place] [facility];  Service: Cardiovascular;  Laterality: N/A;   PIP JOINT FUSION Bilateral    pinky   TEE WITHOUT CARDIOVERSION N/A 06/14/2019   Procedure: TRANSESOPHAGEAL ECHOCARDIOGRAM (TEE);  Surgeon: [name], MD;  Location: [place];  Service: Open Heart Surgery;  Laterality: N/A;    There were no vitals filed for this visit.   Subjective Assessment - 07/31/20 1023     Subjective Pt voices frustration that her Rt knee is not better yet.  Over the weekend she reports she had more swelling and "burning" pain above patella.    Currently in Pain? Yes    Pain Score 2     Pain Location Knee    Pain Orientation Anterior;Right    Aggravating Factors  standing, full ext    Pain Relieving Factors ice, rest.                [facility] PT Assessment - 07/31/20 0001       Strength   Right Hip Flexion 5/5    Right Hip ABduction 5/5              [facility] Adult PT Treatment/Exercise - 07/31/20 0001       Knee/Hip Exercises: Stretches   Quad Stretch Right;3 reps;10 seconds    Quad Stretch Limitations not tolerated today.    Gastroc Stretch Right;Left;1 rep;20 seconds   runner's stretch   Other Knee/Hip Stretches supine hip adductor stretch with strap, x 10 sec x 3 reps      Knee/Hip Exercises: Aerobic   Recumbent Bike 6 min, partial to full revolutions (3 min at L1)    Other Aerobic single lap around the gym in between  exercises (with SPC)      Knee/Hip Exercises: Standing   Heel Raises Both;1 set;10 reps   UE on wall   Wall Squat 15 reps   45     Knee/Hip Exercises: Sidelying   Hip ABduction Strengthening;Right;10 reps   2.5# for first set   Hip ABduction Limitations --      Knee/Hip Exercises: Prone   Other Prone Exercises hip extension with knee flexed to 90 degrees x 10      Vasopneumatic   Number Minutes Vasopneumatic  10 minutes    Vasopnuematic Location  Knee   Rt   Vasopneumatic Pressure Low    Vasopneumatic Temperature  34  PT Long Term Goals - 07/27/20 1330       PT LONG TERM GOAL #1   Title The patient will be indep with HEP.    Time 6    Period Weeks    Status On-going      PT LONG TERM GOAL #2   Title The patient will improve AROM from 14-80 up to 3-110 degrees.    Baseline 30110 on 07/27/20    Time 6    Period Weeks    Status Achieved      PT LONG TERM GOAL #3   Title The patient will improve functional status score from 29 up to 57%.    Time 6    Period Weeks    Status On-going      PT LONG TERM GOAL #4   Title The patient will improve R LE strength to tolerate SLR without brace x 15 reps.    Baseline performed without weight    Time 6    Period Weeks    Status Achieved      PT LONG TERM GOAL #5   Title The patient will return to gait without device for home and limited community without increased pain.    Time 6    Period Weeks    Status On-going                   Plan - 07/31/20 1051     Clinical Impression Statement Pt reported occasional twinges of pain in Rt medial knee during exercises when knee was slightly flexed. Improved tolerance for heel raises with knees fully extended and for wall squats.  Decreased tolerance for prone quad stretch and standing calf stretch.  Progressing towards goals. Returns to MD Thurs.    PT Frequency 2x / week    PT Duration 6 weeks    PT Treatment/Interventions ADLs/Self  Care Home Management;Dry needling;Manual techniques;Gait training;Stair training;Vasopneumatic Device;Aquatic Therapy;Electrical Stimulation;Cryotherapy;Functional mobility training;Therapeutic activities;Therapeutic exercise;Balance training;Neuromuscular re-education;Patient/family education;Passive range of motion;Taping    PT Next Visit Plan progress hip/knee/ankle strengthening to tolerance; flexibility LEs.  Hold BFR due to h/o CABG.  MD note.    PT Home Exercise Plan Access Code: [email or web address]    Consulted and Agree with Plan of Care Patient             Patient will benefit from skilled therapeutic intervention in order to improve the following deficits and impairments:  Pain, Hypomobility, Impaired flexibility, Decreased strength, Decreased range of motion, Difficulty walking, Increased fascial restricitons, Impaired tone, Decreased activity tolerance, Postural dysfunction, Increased edema  Visit Diagnosis: Acute pain of right knee  Muscle weakness (generalized)  Localized edema  Other abnormalities of gait and mobility     Problem List Patient Active Problem List   Diagnosis Date Noted   Obesity    IBS (irritable bowel syndrome)    Hypertension    Hyperlipidemia    H/O: GI bleed    Gastrointestinal ulcer    Gastro-esophageal reflux    Diverticulosis    Depression    Colon polyps    Anxiety    Anemia    Post-nasal drainage 02/10/2020   S/P CABG x 3 LIMA to LAD, SVG sequential to RCA and circumflex in May 2021    Unstable angina ([place]) 06/11/2019   Angina pectoris ([place]) 06/04/2019   Abnormal stress test 05/24/2019   Abnormal stress echocardiogram 05/24/2019   Radiculopathy 04/02/2018   Abnormal ECG 04/01/2018   Radiculitis of right cervical region 01/20/2018  Carpal tunnel syndrome, right 12/16/2017   Right hand pain 06/24/2017   Impingement syndrome of shoulder, right 06/24/2017   Dupuytren's contracture of right hand 06/24/2017   Bilateral carpal tunnel  syndrome 05/23/2017   Depression, major, in partial remission ([place]) 07/14/2015   RLS (restless legs syndrome) 07/14/2015   Atherosclerosis of native coronary artery with angina pectoris ([place]) 05/10/2015   Musculoskeletal chest pain 05/04/2015   Precordial pain 05/04/2015   Generalized anxiety disorder 03/14/2015   Primary insomnia 03/14/2015   Type 2 diabetes mellitus with diabetic neuropathy, with long-term current use of insulin ([place]) 02/16/2010   Hyperlipidemia LDL goal <70 02/16/2010   Essential hypertension 02/16/2010   GERD 02/16/2010   Bilateral knee pain 02/16/2010   Kerin Perna, PTA 07/31/20 11:57 AM  [place] [address] [place], [name], [address] Phone: [phone]   Fax:  [phone]  Name: Tonya Chandler MRN: [id number] Date of Birth: 1960-08-02

## 2020-08-03 ENCOUNTER — Other Ambulatory Visit: Payer: Self-pay

## 2020-08-03 ENCOUNTER — Ambulatory Visit (INDEPENDENT_AMBULATORY_CARE_PROVIDER_SITE_OTHER): Payer: BC Managed Care – PPO | Admitting: Physical Therapy

## 2020-08-03 DIAGNOSIS — M6281 Muscle weakness (generalized): Secondary | ICD-10-CM | POA: Diagnosis not present

## 2020-08-03 DIAGNOSIS — R2689 Other abnormalities of gait and mobility: Secondary | ICD-10-CM

## 2020-08-03 DIAGNOSIS — M25561 Pain in right knee: Secondary | ICD-10-CM | POA: Diagnosis not present

## 2020-08-03 DIAGNOSIS — S83281A Other tear of lateral meniscus, current injury, right knee, initial encounter: Secondary | ICD-10-CM | POA: Diagnosis not present

## 2020-08-03 DIAGNOSIS — R600 Localized edema: Secondary | ICD-10-CM | POA: Diagnosis not present

## 2020-08-03 DIAGNOSIS — S83511A Sprain of anterior cruciate ligament of right knee, initial encounter: Secondary | ICD-10-CM | POA: Diagnosis not present

## 2020-08-03 NOTE — Therapy (Signed)
[place], [name], [address] Phone: [phone]   Fax:  [phone]  Physical Therapy Treatment  Patient Details  Name: Tonya Chandler MRN: [id number] Date of Birth: 1960/12/20 Referring Provider (PT): Frederik Pear, MD   Encounter Date: 08/03/2020   PT End of Session - 08/03/20 1041     Visit Number 8    Number of Visits 12    Date for PT Re-Evaluation 08/21/20    Authorization Type BCBS 60 visits/year combined    PT Start Time 1018    PT Stop Time 1103   ice, last 10 min   PT Time Calculation (min) 45 min    Equipment Utilized During Treatment --   Rt knee brace during standing exercises.   Activity Tolerance Patient tolerated treatment well    Behavior During Therapy [facility] for tasks assessed/performed             Past Medical History:  Diagnosis Date   Abnormal ECG 04/01/2018   Abnormal stress echocardiogram 05/24/2019   Formatting of this note might be different from the original. Added automatically from request for surgery [id number]   Abnormal stress test 05/24/2019   Formatting of this note might be different from the original. Added automatically from request for surgery [id number]   Anemia    Angina pectoris ([place]) 06/04/2019   Anxiety    Atherosclerosis of native coronary artery with angina pectoris ([place]) 05/10/2015   Formatting of this note might be different from the original. 30% lesion-Chiu   Bilateral carpal tunnel syndrome 05/23/2017   Bilateral knee pain 02/16/2010   Qualifier: Diagnosis of  By: Koleen Nimrod MD, Jeffrey     Carpal tunnel syndrome, right 12/16/2017   Colon polyps    Depression    DEPRESSION 02/16/2010   Qualifier: Diagnosis of  By: Koleen Nimrod MD, Dellis Filbert     Depression, major, in partial remission ([place]) 07/14/2015   Diabetes mellitus    Diverticulosis    Dupuytren's contracture of right hand 06/24/2017   Essential hypertension 02/16/2010   Qualifier: Diagnosis of  By: Koleen Nimrod MD,  Dellis Filbert     Gastro-esophageal reflux    Gastrointestinal ulcer    Generalized anxiety disorder 03/14/2015   GERD 02/16/2010   Qualifier: Diagnosis of  By: Koleen Nimrod MD, Dellis Filbert     H/O: GI bleed    Said it was due to taking metformin   Hyperlipidemia    Hyperlipidemia LDL goal <70 02/16/2010   Qualifier: Diagnosis of  By: Koleen Nimrod MD, Dellis Filbert     Hypertension    IBS (irritable bowel syndrome)    Impingement syndrome of shoulder, right 06/24/2017   Midline low back pain with left-sided sciatica 12/29/2013   Musculoskeletal chest pain 05/04/2015   Obesity    Post-nasal drainage 02/10/2020   Last Assessment & Plan:  Formatting of this note might be different from the original. Concern over postnasal drainage. Throat awareness symptoms ever since she was packed in the right nostril last summer.  Was treated with antibiotics with some improvement.  Causes intermittent hoarseness and loss of voice.  Non-smoker. EXAM by anterior rhinoscopy shows no obvious polyps or purulence or obstructi   Precordial pain 05/04/2015   Primary insomnia 03/14/2015   Radiculitis of right cervical region 01/20/2018   Radiculopathy 04/02/2018   Right hand pain 06/24/2017   RLS (restless legs syndrome) 07/14/2015   Type 2 diabetes mellitus with diabetic neuropathy, with long-term current use of insulin ([place]) 02/16/2010   Qualifier: Diagnosis  of  By: Koleen Nimrod MD, Dellis Filbert     Unstable angina [facility]) 06/11/2019    Past Surgical History:  Procedure Laterality Date   ABDOMINAL HYSTERECTOMY     ANTERIOR CERVICAL DECOMP/DISCECTOMY FUSION N/A 04/02/2018   Procedure: ANTERIOR CERVICAL DECOMPRESSION FUSION, CERVICAL FOUR-FIVE, CERVICAL FIVE-SIX, WITH INSTRUMENTATION AND ALLOGRAFT;  Surgeon: Phylliss Bob, MD;  Location: [place];  Service: Orthopedics;  Laterality: N/A;   COLONOSCOPY WITH ESOPHAGOGASTRODUODENOSCOPY (EGD)  05/04/2009   CORONARY ARTERY BYPASS GRAFT N/A 06/14/2019   Procedure: CORONARY ARTERY BYPASS GRAFTING (CABG) using  LIMA to LAD; Endoscopic Right Greater Saphenous Vein Harvest: SVG to Circ (distal); SVG to RCA (distal).;  Surgeon: [name], MD;  Location: [place];  Service: Open Heart Surgery;  Laterality: N/A;   ENDOVEIN HARVEST OF GREATER SAPHENOUS VEIN Right 06/14/2019   Procedure: [place]opes Of Greater Saphenous Vein;  Surgeon: [name], MD;  Location: [place];  Service: Open Heart Surgery;  Laterality: Right;   LEFT HEART CATH AND CORONARY ANGIOGRAPHY N/A 06/11/2019   Procedure: LEFT HEART CATH AND CORONARY ANGIOGRAPHY;  Surgeon: Wellington Hampshire, MD;  Location: [place] CV [facility];  Service: Cardiovascular;  Laterality: N/A;   PIP JOINT FUSION Bilateral    pinky   TEE WITHOUT CARDIOVERSION N/A 06/14/2019   Procedure: TRANSESOPHAGEAL ECHOCARDIOGRAM (TEE);  Surgeon: [name], MD;  Location: [place];  Service: Open Heart Surgery;  Laterality: N/A;    There were no vitals filed for this visit.   Subjective Assessment - 08/03/20 1054     Subjective Pt reports that she pivoted on Rt leg to sit down and irritated her knee (points to lateral side).  She reports it feels warm today.  She reports she has had some improvement in knee since starting therapy, but she is not fully better yet.    Patient Stated Goals Strengthen knee, no pain, full use of R leg    Currently in Pain? Yes    Pain Score 6     Pain Location Knee    Pain Orientation Right;Medial;Lateral;Anterior    Pain Descriptors / Indicators Aching;Sore    Aggravating Factors  twisting, full extension of knee    Pain Relieving Factors ice, rest.                [facility] PT Assessment - 08/03/20 0001       Assessment   Medical Diagnosis strengthen quad, MCL    Referring Provider (PT) Frederik Pear, MD    Onset Date/Surgical Date 06/27/20    Hand Dominance Left    Next MD Visit 08/03/20    Prior Therapy known to our clinic from prior PT for different condition      Observation/Other Assessments   Focus on  Therapeutic Outcomes (FOTO)  45%      AROM   Right Knee Extension -3    Right Knee Flexion 115              [facility] Adult PT Treatment/Exercise - 08/03/20 0001       Knee/Hip Exercises: Stretches   Sports administrator Right;3 reps;30 seconds   prone with strap     Knee/Hip Exercises: Aerobic   Nustep L5: 5.5 min for warm up.      Knee/Hip Exercises: Standing   Forward Step Up Right;1 set;5 reps;Step Height: 4";Step Height: 6"    Stairs reciprocal pattern up/down 4-6" steps with cane and railing x 15      Knee/Hip Exercises: Supine   Heel Slides  Right;1 set;5 reps   for measurement   Bridges AROM;Strengthening;Both;10 reps    Straight Leg Raises Strengthening;Right;1 set;15 reps    Straight Leg Raise with External Rotation Strengthening;Right;1 set;10 reps      Knee/Hip Exercises: Prone   Other Prone Exercises prone Rt quad set x 5 sec x 10      Cryotherapy   Number Minutes Cryotherapy 10 Minutes    Cryotherapy Location Knee    Type of Cryotherapy Ice pack      Manual Therapy   Soft tissue mobilization IASTM to mid to distal Rt quad to decrease fascial restrictions and improve mobility.                         PT Long Term Goals - 07/27/20 1330       PT LONG TERM GOAL #1   Title The patient will be indep with HEP.    Time 6    Period Weeks    Status On-going      PT LONG TERM GOAL #2   Title The patient will improve AROM from 14-80 up to 3-110 degrees.    Baseline 3-110 on 07/27/20    Time 6    Period Weeks    Status Achieved      PT LONG TERM GOAL #3   Title The patient will improve functional status score from 29 up to 57%.    Time 6    Period Weeks    Status On-going      PT LONG TERM GOAL #4   Title The patient will improve R LE strength to tolerate SLR without brace x 15 reps.    Baseline performed without weight    Time 6    Period Weeks    Status Achieved      PT LONG TERM GOAL #5   Title The patient will return to gait without device  for home and limited community without increased pain.    Time 6    Period Weeks    Status On-going                   Plan - 08/03/20 1241     Clinical Impression Statement Pt demonstrated improved Rt knee flexion ROM, continued pain and limitation when attempting full knee ext ROM.  Pt reported reduction of pain / tightness with exercises (down to 2/10).  She requires some cues for improved gait quality - tends to keep Rt knee stiff during toe off and swing through. After cues for sequence, pt was able to ambulate up/down 6" steps with cane with reciprocal pattern without difficulty or increase in pain.  Pt has partially met her goals.  Will await any new feedback from upcoming MD appt.    Rehab Potential Good    PT Frequency 2x / week    PT Duration 6 weeks    PT Treatment/Interventions ADLs/Self Care Home Management;Dry needling;Manual techniques;Gait training;Stair training;Vasopneumatic Device;Aquatic Therapy;Electrical Stimulation;Cryotherapy;Functional mobility training;Therapeutic activities;Therapeutic exercise;Balance training;Neuromuscular re-education;Patient/family education;Passive range of motion;Taping    PT Next Visit Plan progress hip/knee/ankle strengthening to tolerance; flexibility LEs.    PT Home Exercise Plan Access Code: [email or web address]    Consulted and Agree with Plan of Care Patient             Patient will benefit from skilled therapeutic intervention in order to improve the following deficits and impairments:  Pain, Hypomobility, Impaired flexibility, Decreased strength, Decreased range of motion, Difficulty walking,  Increased fascial restricitons, Impaired tone, Decreased activity tolerance, Postural dysfunction, Increased edema  Visit Diagnosis: Acute pain of right knee  Muscle weakness (generalized)  Localized edema  Other abnormalities of gait and mobility     Problem List Patient Active Problem List   Diagnosis Date Noted   Obesity    IBS  (irritable bowel syndrome)    Hypertension    Hyperlipidemia    H/O: GI bleed    Gastrointestinal ulcer    Gastro-esophageal reflux    Diverticulosis    Depression    Colon polyps    Anxiety    Anemia    Post-nasal drainage 02/10/2020   S/P CABG x 3 LIMA to LAD, SVG sequential to RCA and circumflex in May 2021    Unstable angina ([place]) 06/11/2019   Angina pectoris ([place]) 06/04/2019   Abnormal stress test 05/24/2019   Abnormal stress echocardiogram 05/24/2019   Radiculopathy 04/02/2018   Abnormal ECG 04/01/2018   Radiculitis of right cervical region 01/20/2018   Carpal tunnel syndrome, right 12/16/2017   Right hand pain 06/24/2017   Impingement syndrome of shoulder, right 06/24/2017   Dupuytren's contracture of right hand 06/24/2017   Bilateral carpal tunnel syndrome 05/23/2017   Depression, major, in partial remission ([place]) 07/14/2015   RLS (restless legs syndrome) 07/14/2015   Atherosclerosis of native coronary artery with angina pectoris ([place]) 05/10/2015   Musculoskeletal chest pain 05/04/2015   Precordial pain 05/04/2015   Generalized anxiety disorder 03/14/2015   Primary insomnia 03/14/2015   Type 2 diabetes mellitus with diabetic neuropathy, with long-term current use of insulin ([place]) 02/16/2010   Hyperlipidemia LDL goal <70 02/16/2010   Essential hypertension 02/16/2010   GERD 02/16/2010   Bilateral knee pain 02/16/2010   Kerin Perna, PTA 08/03/20 12:50 PM   [facility] [place], [name], [address] Phone: [phone]   Fax:  [phone]  Name: Tyra Gural MRN: [id number] Date of Birth: 03-07-1960

## 2020-08-07 ENCOUNTER — Ambulatory Visit (INDEPENDENT_AMBULATORY_CARE_PROVIDER_SITE_OTHER): Payer: BC Managed Care – PPO | Admitting: Rehabilitative and Restorative Service Providers"

## 2020-08-07 ENCOUNTER — Other Ambulatory Visit: Payer: Self-pay

## 2020-08-07 DIAGNOSIS — R2689 Other abnormalities of gait and mobility: Secondary | ICD-10-CM

## 2020-08-07 DIAGNOSIS — R600 Localized edema: Secondary | ICD-10-CM

## 2020-08-07 DIAGNOSIS — M6281 Muscle weakness (generalized): Secondary | ICD-10-CM

## 2020-08-07 DIAGNOSIS — M25561 Pain in right knee: Secondary | ICD-10-CM | POA: Diagnosis not present

## 2020-08-07 NOTE — Therapy (Signed)
[place], [name], [address] Phone: [phone]   Fax:  [phone]  Physical Therapy Treatment  Patient Details  Name: Tonya Chandler MRN: [id number] Date of Birth: January 14, 1961 Referring Provider (PT): Frederik Pear, MD   Encounter Date: 08/07/2020   PT End of Session - 08/07/20 1021     Visit Number 9    Number of Visits 12    Date for PT Re-Evaluation 08/21/20    Authorization Type BCBS 60 visits/year combined    PT Start Time 1016    PT Stop Time 1100    PT Time Calculation (min) 44 min    Equipment Utilized During Treatment --   Rt knee brace during standing exercises.   Activity Tolerance Patient tolerated treatment well    Behavior During Therapy [facility] for tasks assessed/performed             Past Medical History:  Diagnosis Date   Abnormal ECG 04/01/2018   Abnormal stress echocardiogram 05/24/2019   Formatting of this note might be different from the original. Added automatically from request for surgery [id number]   Abnormal stress test 05/24/2019   Formatting of this note might be different from the original. Added automatically from request for surgery [id number]   Anemia    Angina pectoris ([place]) 06/04/2019   Anxiety    Atherosclerosis of native coronary artery with angina pectoris ([place]) 05/10/2015   Formatting of this note might be different from the original. 30% lesion-Chiu   Bilateral carpal tunnel syndrome 05/23/2017   Bilateral knee pain 02/16/2010   Qualifier: Diagnosis of  By: Koleen Nimrod MD, Jeffrey     Carpal tunnel syndrome, right 12/16/2017   Colon polyps    Depression    DEPRESSION 02/16/2010   Qualifier: Diagnosis of  By: Koleen Nimrod MD, Dellis Filbert     Depression, major, in partial remission ([place]) 07/14/2015   Diabetes mellitus    Diverticulosis    Dupuytren's contracture of right hand 06/24/2017   Essential hypertension 02/16/2010   Qualifier: Diagnosis of  By: Koleen Nimrod MD, Dellis Filbert      Gastro-esophageal reflux    Gastrointestinal ulcer    Generalized anxiety disorder 03/14/2015   GERD 02/16/2010   Qualifier: Diagnosis of  By: Koleen Nimrod MD, Dellis Filbert     H/O: GI bleed    Said it was due to taking metformin   Hyperlipidemia    Hyperlipidemia LDL goal <70 02/16/2010   Qualifier: Diagnosis of  By: Koleen Nimrod MD, Dellis Filbert     Hypertension    IBS (irritable bowel syndrome)    Impingement syndrome of shoulder, right 06/24/2017   Midline low back pain with left-sided sciatica 12/29/2013   Musculoskeletal chest pain 05/04/2015   Obesity    Post-nasal drainage 02/10/2020   Last Assessment & Plan:  Formatting of this note might be different from the original. Concern over postnasal drainage. Throat awareness symptoms ever since she was packed in the right nostril last summer.  Was treated with antibiotics with some improvement.  Causes intermittent hoarseness and loss of voice.  Non-smoker. EXAM by anterior rhinoscopy shows no obvious polyps or purulence or obstructi   Precordial pain 05/04/2015   Primary insomnia 03/14/2015   Radiculitis of right cervical region 01/20/2018   Radiculopathy 04/02/2018   Right hand pain 06/24/2017   RLS (restless legs syndrome) 07/14/2015   Type 2 diabetes mellitus with diabetic neuropathy, with long-term current use of insulin ([place]) 02/16/2010   Qualifier: Diagnosis of  By: Koleen Nimrod MD,  Dellis Filbert     Unstable angina ([place]) 06/11/2019    Past Surgical History:  Procedure Laterality Date   ABDOMINAL HYSTERECTOMY     ANTERIOR CERVICAL DECOMP/DISCECTOMY FUSION N/A 04/02/2018   Procedure: ANTERIOR CERVICAL DECOMPRESSION FUSION, CERVICAL FOUR-FIVE, CERVICAL FIVE-SIX, WITH INSTRUMENTATION AND ALLOGRAFT;  Surgeon: Phylliss Bob, MD;  Location: [place];  Service: Orthopedics;  Laterality: N/A;   COLONOSCOPY WITH ESOPHAGOGASTRODUODENOSCOPY (EGD)  05/04/2009   CORONARY ARTERY BYPASS GRAFT N/A 06/14/2019   Procedure: CORONARY ARTERY BYPASS GRAFTING (CABG) using LIMA to LAD;  Endoscopic Right Greater Saphenous Vein Harvest: SVG to Circ (distal); SVG to RCA (distal).;  Surgeon: [name], MD;  Location: [place];  Service: Open Heart Surgery;  Laterality: N/A;   ENDOVEIN HARVEST OF GREATER SAPHENOUS VEIN Right 06/14/2019   Procedure: [place] Ropes Of Greater Saphenous Vein;  Surgeon: [name], MD;  Location: [place];  Service: Open Heart Surgery;  Laterality: Right;   LEFT HEART CATH AND CORONARY ANGIOGRAPHY N/A 06/11/2019   Procedure: LEFT HEART CATH AND CORONARY ANGIOGRAPHY;  Surgeon: Wellington Hampshire, MD;  Location: [place] CV [facility];  Service: Cardiovascular;  Laterality: N/A;   PIP JOINT FUSION Bilateral    pinky   TEE WITHOUT CARDIOVERSION N/A 06/14/2019   Procedure: TRANSESOPHAGEAL ECHOCARDIOGRAM (TEE);  Surgeon: [name], MD;  Location: [place];  Service: Open Heart Surgery;  Laterality: N/A;    There were no vitals filed for this visit.   Subjective Assessment - 08/07/20 1018     Subjective The patient reports 2 incidents in which her R knee "collapsed".  One last week on the steps (no brace)-- she was descending the steps and forgot about her knee and it buckled and she sat on the steps.  She also had an incident this morning in the shower where she squated to reach for soap and the knee buckled laterally-- she did not fall.  She has 4/10 pain.    Pertinent History CABG 2021, diabetes, neuropathy, HTN    Patient Stated Goals Strengthen knee, no pain, full use of R leg    Currently in Pain? Yes    Pain Score 4     Pain Location Knee    Pain Orientation Right;Medial;Lateral;Anterior    Pain Descriptors / Indicators Aching;Sore    Pain Type Acute pain    Pain Onset 1 to 4 weeks ago    Pain Frequency Intermittent    Aggravating Factors  full extension    Pain Relieving Factors ice, rest                [facility] PT Assessment - 08/07/20 1022       Assessment   Medical Diagnosis strengthen quad, MCL    Referring Provider  (PT) Frederik Pear, MD    Onset Date/Surgical Date 06/27/20    Hand Dominance Left                           [facility] Adult PT Treatment/Exercise - 08/07/20 1022       Exercises   Exercises Knee/Hip      Knee/Hip Exercises: Aerobic   Recumbent Bike 5 minutes working on ROM and dec'ing stiffness to warm up      Knee/Hip Exercises: Machines for Strengthening   Cybex Leg Press 3 plates x 10 reps x 2 sets      Knee/Hip Exercises: Standing   Heel Raises Both;10 reps;2 sets    Knee Flexion Strengthening;Right;10  reps    Lateral Step Up Right;10 reps;2 sets    SLS R leg x 10 seconds with bilat UE support    Other Standing Knee Exercises *brace donned for standing exercises    Other Standing Knee Exercises backwards walking x 20 feet x 3 sets with brace donned, gait belt with CGA for safety      Knee/Hip Exercises: Seated   Other Seated Knee/Hip Exercises isometric knee extension pressing into physioball at 80 degrees flexion and 60 degrees flexion (moving into extended position into ball)      Knee/Hip Exercises: Supine   Straight Leg Raise with External Rotation Strengthening;Right;1 set;10 reps      Knee/Hip Exercises: Sidelying   Hip ADduction AROM;Strengthening;Right;10 reps      Knee/Hip Exercises: Prone   Hamstring Curl --   12 reps     Vasopneumatic   Number Minutes Vasopneumatic  10 minutes    Vasopnuematic Location  Knee    Vasopneumatic Pressure Low    Vasopneumatic Temperature  34      Manual Therapy   Manual Therapy Taping    Kinesiotex Facilitate Muscle      Kinesiotix   Facilitate Muscle  I strip adductor/medially and distal/medial to knee                    PT Education - 08/07/20 1102     Education Details HEP modified    Person(s) Educated Patient    Methods Explanation;Demonstration;Handout    Comprehension Verbalized understanding;Returned demonstration                 PT Long Term Goals - 07/27/20 1330       PT  LONG TERM GOAL #1   Title The patient will be indep with HEP.    Time 6    Period Weeks    Status On-going      PT LONG TERM GOAL #2   Title The patient will improve AROM from 14-80 up to 3-110 degrees.    Baseline 30110 on 07/27/20    Time 6    Period Weeks    Status Achieved      PT LONG TERM GOAL #3   Title The patient will improve functional status score from 29 up to 57%.    Time 6    Period Weeks    Status On-going      PT LONG TERM GOAL #4   Title The patient will improve R LE strength to tolerate SLR without brace x 15 reps.    Baseline performed without weight    Time 6    Period Weeks    Status Achieved      PT LONG TERM GOAL #5   Title The patient will return to gait without device for home and limited community without increased pain.    Time 6    Period Weeks    Status On-going                   Plan - 08/07/20 1315     Clinical Impression Statement The patient has dec'd pain, which is leading to her forgetting at times that she cannot squat or descend steps without brace donned.  She has had 2 episodes of buckling since last session when loading R knee during functional tasks.  PT worked on progressing HEP to standing activities to further progress strengthening.  Plan to continue to patient tolerance.    PT Treatment/Interventions ADLs/Self Care Home  Management;Dry needling;Manual techniques;Gait training;Stair training;Vasopneumatic Device;Aquatic Therapy;Electrical Stimulation;Cryotherapy;Functional mobility training;Therapeutic activities;Therapeutic exercise;Balance training;Neuromuscular re-education;Patient/family education;Passive range of motion;Taping    PT Next Visit Plan progress hip/knee/ankle strengthening to tolerance; flexibility LEs.    PT Home Exercise Plan Access Code: [email or web address]    Consulted and Agree with Plan of Care Patient             Patient will benefit from skilled therapeutic intervention in order to improve the following  deficits and impairments:     Visit Diagnosis: Acute pain of right knee  Muscle weakness (generalized)  Localized edema  Other abnormalities of gait and mobility     Problem List Patient Active Problem List   Diagnosis Date Noted   Obesity    IBS (irritable bowel syndrome)    Hypertension    Hyperlipidemia    H/O: GI bleed    Gastrointestinal ulcer    Gastro-esophageal reflux    Diverticulosis    Depression    Colon polyps    Anxiety    Anemia    Post-nasal drainage 02/10/2020   S/P CABG x 3 LIMA to LAD, SVG sequential to RCA and circumflex in May 2021    Unstable angina ([place]) 06/11/2019   Angina pectoris ([place]) 06/04/2019   Abnormal stress test 05/24/2019   Abnormal stress echocardiogram 05/24/2019   Radiculopathy 04/02/2018   Abnormal ECG 04/01/2018   Radiculitis of right cervical region 01/20/2018   Carpal tunnel syndrome, right 12/16/2017   Right hand pain 06/24/2017   Impingement syndrome of shoulder, right 06/24/2017   Dupuytren's contracture of right hand 06/24/2017   Bilateral carpal tunnel syndrome 05/23/2017   Depression, major, in partial remission ([place]) 07/14/2015   RLS (restless legs syndrome) 07/14/2015   Atherosclerosis of native coronary artery with angina pectoris ([place]) 05/10/2015   Musculoskeletal chest pain 05/04/2015   Precordial pain 05/04/2015   Generalized anxiety disorder 03/14/2015   Primary insomnia 03/14/2015   Type 2 diabetes mellitus with diabetic neuropathy, with long-term current use of insulin ([place]) 02/16/2010   Hyperlipidemia LDL goal <70 02/16/2010   Essential hypertension 02/16/2010   GERD 02/16/2010   Bilateral knee pain 02/16/2010    Abygale Karpf, PT  08/07/2020, 1:37 PM  [facility] [place], [name], [address] Phone: [phone]   Fax:  [phone]  Name: Tonya Chandler MRN: [id number] Date of Birth: Jun 10, 1960

## 2020-08-07 NOTE — Patient Instructions (Signed)
Access Code: [email or web address] URL: [email or web address] Date: 08/07/2020 Prepared by: Rudell Cobb  Exercises Supine Active Straight Leg Raise - 2 x daily - 7 x weekly - 1 sets - 10-12 reps - 3 seconds hold Sidelying Hip Abduction - 2 x daily - 7 x weekly - 1 sets - 10-12 reps - 3 seconds hold Hip Adductors and Hamstring Stretch with Strap - 1 x daily - 7 x weekly - 1 sets - 2-3 reps - 15 seconds hold Prone Knee Flexion - 2 x daily - 7 x weekly - 1 sets - 10 reps Heel Raises with Counter Support - 2 x daily - 7 x weekly - 1 sets - 10 reps Standing Single Leg Stance with Counter Support - 2 x daily - 7 x weekly - 1 sets - 3 reps - 5-10 seconds hold

## 2020-08-10 ENCOUNTER — Ambulatory Visit (INDEPENDENT_AMBULATORY_CARE_PROVIDER_SITE_OTHER): Payer: BC Managed Care – PPO | Admitting: Physical Therapy

## 2020-08-10 ENCOUNTER — Encounter: Payer: Self-pay | Admitting: Physical Therapy

## 2020-08-10 ENCOUNTER — Other Ambulatory Visit: Payer: Self-pay

## 2020-08-10 DIAGNOSIS — M25561 Pain in right knee: Secondary | ICD-10-CM

## 2020-08-10 DIAGNOSIS — R2689 Other abnormalities of gait and mobility: Secondary | ICD-10-CM

## 2020-08-10 DIAGNOSIS — R600 Localized edema: Secondary | ICD-10-CM | POA: Diagnosis not present

## 2020-08-10 DIAGNOSIS — M6281 Muscle weakness (generalized): Secondary | ICD-10-CM

## 2020-08-10 NOTE — Therapy (Signed)
[place], [name], [address] Phone: [phone]   Fax:  [phone]  Physical Therapy Treatment  Patient Details  Name: Tonya Chandler MRN: [id number] Date of Birth: 04-20-1960 Referring Provider (PT): Frederik Pear, MD   Encounter Date: 08/10/2020   PT End of Session - 08/10/20 1054     Visit Number 10    Number of Visits 12    Date for PT Re-Evaluation 08/21/20    Authorization Type BCBS 60 visits/year combined    PT Start Time 1054    PT Stop Time 1142    PT Time Calculation (min) 48 min    Equipment Utilized During Treatment --   Rt knee brace during standing exercises.   Activity Tolerance Patient tolerated treatment well    Behavior During Therapy [facility] for tasks assessed/performed             Past Medical History:  Diagnosis Date   Abnormal ECG 04/01/2018   Abnormal stress echocardiogram 05/24/2019   Formatting of this note might be different from the original. Added automatically from request for surgery [id number]   Abnormal stress test 05/24/2019   Formatting of this note might be different from the original. Added automatically from request for surgery [id number]   Anemia    Angina pectoris ([place]) 06/04/2019   Anxiety    Atherosclerosis of native coronary artery with angina pectoris ([place]) 05/10/2015   Formatting of this note might be different from the original. 30% lesion-Chiu   Bilateral carpal tunnel syndrome 05/23/2017   Bilateral knee pain 02/16/2010   Qualifier: Diagnosis of  By: Koleen Nimrod MD, Jeffrey     Carpal tunnel syndrome, right 12/16/2017   Colon polyps    Depression    DEPRESSION 02/16/2010   Qualifier: Diagnosis of  By: Koleen Nimrod MD, Dellis Filbert     Depression, major, in partial remission ([place]) 07/14/2015   Diabetes mellitus    Diverticulosis    Dupuytren's contracture of right hand 06/24/2017   Essential hypertension 02/16/2010   Qualifier: Diagnosis of  By: Koleen Nimrod MD, Dellis Filbert      Gastro-esophageal reflux    Gastrointestinal ulcer    Generalized anxiety disorder 03/14/2015   GERD 02/16/2010   Qualifier: Diagnosis of  By: Koleen Nimrod MD, Dellis Filbert     H/O: GI bleed    Said it was due to taking metformin   Hyperlipidemia    Hyperlipidemia LDL goal <70 02/16/2010   Qualifier: Diagnosis of  By: Koleen Nimrod MD, Dellis Filbert     Hypertension    IBS (irritable bowel syndrome)    Impingement syndrome of shoulder, right 06/24/2017   Midline low back pain with left-sided sciatica 12/29/2013   Musculoskeletal chest pain 05/04/2015   Obesity    Post-nasal drainage 02/10/2020   Last Assessment & Plan:  Formatting of this note might be different from the original. Concern over postnasal drainage. Throat awareness symptoms ever since she was packed in the right nostril last summer.  Was treated with antibiotics with some improvement.  Causes intermittent hoarseness and loss of voice.  Non-smoker. EXAM by anterior rhinoscopy shows no obvious polyps or purulence or obstructi   Precordial pain 05/04/2015   Primary insomnia 03/14/2015   Radiculitis of right cervical region 01/20/2018   Radiculopathy 04/02/2018   Right hand pain 06/24/2017   RLS (restless legs syndrome) 07/14/2015   Type 2 diabetes mellitus with diabetic neuropathy, with long-term current use of insulin ([place]) 02/16/2010   Qualifier: Diagnosis of  By: Koleen Nimrod MD,  Dellis Filbert     Unstable angina ([place]) 06/11/2019    Past Surgical History:  Procedure Laterality Date   ABDOMINAL HYSTERECTOMY     ANTERIOR CERVICAL DECOMP/DISCECTOMY FUSION N/A 04/02/2018   Procedure: ANTERIOR CERVICAL DECOMPRESSION FUSION, CERVICAL FOUR-FIVE, CERVICAL FIVE-SIX, WITH INSTRUMENTATION AND ALLOGRAFT;  Surgeon: Phylliss Bob, MD;  Location: [place];  Service: Orthopedics;  Laterality: N/A;   COLONOSCOPY WITH ESOPHAGOGASTRODUODENOSCOPY (EGD)  05/04/2009   CORONARY ARTERY BYPASS GRAFT N/A 06/14/2019   Procedure: CORONARY ARTERY BYPASS GRAFTING (CABG) using LIMA to LAD;  Endoscopic Right Greater Saphenous Vein Harvest: SVG to Circ (distal); SVG to RCA (distal).;  Surgeon: [name], MD;  Location: [place];  Service: Open Heart Surgery;  Laterality: N/A;   ENDOVEIN HARVEST OF GREATER SAPHENOUS VEIN Right 06/14/2019   Procedure: [place]opes Of Greater Saphenous Vein;  Surgeon: [name], MD;  Location: [place];  Service: Open Heart Surgery;  Laterality: Right;   LEFT HEART CATH AND CORONARY ANGIOGRAPHY N/A 06/11/2019   Procedure: LEFT HEART CATH AND CORONARY ANGIOGRAPHY;  Surgeon: [place] Hampshire, MD;  Location: [place] [facility];  Service: Cardiovascular;  Laterality: N/A;   PIP JOINT FUSION Bilateral    pinky   TEE WITHOUT CARDIOVERSION N/A 06/14/2019   Procedure: TRANSESOPHAGEAL ECHOCARDIOGRAM (TEE);  Surgeon: [name], MD;  Location: [place];  Service: Open Heart Surgery;  Laterality: N/A;    There were no vitals filed for this visit.   Subjective Assessment - 08/10/20 1059     Subjective Pt reports the doctor is now allowing her to drive short distances (didn't specify length of time), but no 8 hr days or trip with multiple stops yet..  She reports no buckling of knee since Tuesday.  She is wearing her new brace she received last week.  She reports that the tape continues to help.  "This is the best day I've had yet".    Pertinent History CABG 2021, diabetes, neuropathy, HTN    Patient Stated Goals Strengthen knee, no pain, full use of R leg    Currently in Pain? No/denies    Pain Score 0-No pain                [facility] PT Assessment - 08/10/20 0001       Assessment   Medical Diagnosis strengthen quad, MCL    Referring Provider (PT) Frederik Pear, MD    Onset Date/Surgical Date 06/27/20    Hand Dominance Left    Next MD Visit 08/25/20      Flexibility   Soft Tissue Assessment /Muscle Length yes    Quadriceps Rt quad 117, Lt 29              [facility] Adult PT Treatment/Exercise - 08/10/20 0001       Knee/Hip  Exercises: Stretches   Passive Hamstring Stretch Right;2 reps;30 seconds    Quad Stretch Right;3 reps;Left;1 rep;30 seconds   prone with strap   Other Knee/Hip Stretches Rt adductor stretch in standing x 3 reps of 12 sec.  Standing Rt calf strecth x 20 sec x 2     Knee/Hip Exercises: Aerobic   Recumbent Bike full slow revolutions to L1 - 5 min total      Knee/Hip Exercises: Machines for Strengthening   Cybex Leg Press 3 plates x 10 reps, 4 plates x 10 reps      Knee/Hip Exercises: Standing   Heel Raises Both;1 set;10 reps    Forward Step Up Right;1 set;10  reps;Hand Hold: 2;Step Height: 6"    Step Down Left;1 set;10 reps;Hand Hold: 2;Step Height: 4"   and retro step up with RLE   SLS SLS on blue pad x 10/ 20 sec with intermittent UE support to steady.    Other Standing Knee Exercises *brace donned for standing exercises      Knee/Hip Exercises: Sidelying   Hip ABduction Strengthening;Right;1 set;10 reps    Hip ADduction Strengthening;Right;1 set      Knee/Hip Exercises: Prone   Hamstring Curl 1 set;10 reps   RLE   Straight Leg Raises Right;Left;1 set;10 reps      Vasopneumatic   Number Minutes Vasopneumatic  10 minutes    Vasopnuematic Location  Knee   Rt   Vasopneumatic Pressure Low    Vasopneumatic Temperature  34      Kinesiotix   Facilitate Muscle  I strip adductor/medially and distal/medial to knee SENSITIVE SKIN rock tape.                          PT Long Term Goals - 07/27/20 1330       PT LONG TERM GOAL #1   Title The patient will be indep with HEP.    Time 6    Period Weeks    Status On-going      PT LONG TERM GOAL #2   Title The patient will improve AROM from 14-80 up to 3-110 degrees.    Baseline 30110 on 07/27/20    Time 6    Period Weeks    Status Achieved      PT LONG TERM GOAL #3   Title The patient will improve functional status score from 29 up to 57%.    Time 6    Period Weeks    Status On-going      PT LONG TERM GOAL #4    Title The patient will improve R LE strength to tolerate SLR without brace x 15 reps.    Baseline performed without weight    Time 6    Period Weeks    Status Achieved      PT LONG TERM GOAL #5   Title The patient will return to gait without device for home and limited community without increased pain.    Time 6    Period Weeks    Status On-going                   Plan - 08/10/20 1110     Clinical Impression Statement Pt able to turn bicycle on at L1. She reported some Rt medial knee pain with heel raises; resolved with rest.  She reported some increased crepitus in Rt knee with additional plate during leg press. Minor cues to hold stretches longer. Prone quad flexibility improving in RLE. Overall, all exercises tolerated well.  Progressing well towards goals.    Rehab Potential Good    PT Frequency 2x / week    PT Treatment/Interventions ADLs/Self Care Home Management;Dry needling;Manual techniques;Gait training;Stair training;Vasopneumatic Device;Aquatic Therapy;Electrical Stimulation;Cryotherapy;Functional mobility training;Therapeutic activities;Therapeutic exercise;Balance training;Neuromuscular re-education;Patient/family education;Passive range of motion;Taping    PT Next Visit Plan progress hip/knee/ankle strengthening to tolerance; flexibility LEs.    PT Home Exercise Plan Access Code: [email or web address]    Consulted and Agree with Plan of Care Patient             Patient will benefit from skilled therapeutic intervention in order to improve the following deficits and impairments:  Pain, Hypomobility, Impaired flexibility, Decreased strength, Decreased range of motion, Difficulty walking, Increased fascial restricitons, Impaired tone, Decreased activity tolerance, Postural dysfunction, Increased edema  Visit Diagnosis: Acute pain of right knee  Muscle weakness (generalized)  Localized edema  Other abnormalities of gait and mobility     Problem List Patient Active  Problem List   Diagnosis Date Noted   Obesity    IBS (irritable bowel syndrome)    Hypertension    Hyperlipidemia    H/O: GI bleed    Gastrointestinal ulcer    Gastro-esophageal reflux    Diverticulosis    Depression    Colon polyps    Anxiety    Anemia    Post-nasal drainage 02/10/2020   S/P CABG x 3 LIMA to LAD, SVG sequential to RCA and circumflex in May 2021    Unstable angina ([place]) 06/11/2019   Angina pectoris ([place]) 06/04/2019   Abnormal stress test 05/24/2019   Abnormal stress echocardiogram 05/24/2019   Radiculopathy 04/02/2018   Abnormal ECG 04/01/2018   Radiculitis of right cervical region 01/20/2018   Carpal tunnel syndrome, right 12/16/2017   Right hand pain 06/24/2017   Impingement syndrome of shoulder, right 06/24/2017   Dupuytren's contracture of right hand 06/24/2017   Bilateral carpal tunnel syndrome 05/23/2017   Depression, major, in partial remission ([place]) 07/14/2015   RLS (restless legs syndrome) 07/14/2015   Atherosclerosis of native coronary artery with angina pectoris ([place]) 05/10/2015   Musculoskeletal chest pain 05/04/2015   Precordial pain 05/04/2015   Generalized anxiety disorder 03/14/2015   Primary insomnia 03/14/2015   Type 2 diabetes mellitus with diabetic neuropathy, with long-term current use of insulin ([place]) 02/16/2010   Hyperlipidemia LDL goal <70 02/16/2010   Essential hypertension 02/16/2010   GERD 02/16/2010   Bilateral knee pain 02/16/2010   Kerin Perna, PTA 08/10/20 11:42 AM   [place], [name], [address] Phone: [phone]   Fax:  [phone]  Name: Tonya Chandler MRN: [id number] Date of Birth: 1960/04/01

## 2020-08-14 ENCOUNTER — Other Ambulatory Visit: Payer: Self-pay | Admitting: Medical-Surgical

## 2020-08-14 ENCOUNTER — Encounter: Payer: BC Managed Care – PPO | Admitting: Physical Therapy

## 2020-08-17 ENCOUNTER — Other Ambulatory Visit: Payer: Self-pay

## 2020-08-17 ENCOUNTER — Ambulatory Visit (INDEPENDENT_AMBULATORY_CARE_PROVIDER_SITE_OTHER): Payer: BC Managed Care – PPO | Admitting: Physical Therapy

## 2020-08-17 ENCOUNTER — Encounter: Payer: Self-pay | Admitting: Physical Therapy

## 2020-08-17 DIAGNOSIS — M25561 Pain in right knee: Secondary | ICD-10-CM | POA: Diagnosis not present

## 2020-08-17 DIAGNOSIS — R2689 Other abnormalities of gait and mobility: Secondary | ICD-10-CM | POA: Diagnosis not present

## 2020-08-17 DIAGNOSIS — R600 Localized edema: Secondary | ICD-10-CM

## 2020-08-17 DIAGNOSIS — M6281 Muscle weakness (generalized): Secondary | ICD-10-CM | POA: Diagnosis not present

## 2020-08-17 NOTE — Therapy (Signed)
[place], [name], [address] Phone: [phone]   Fax:  [phone]  Physical Therapy Treatment  Patient Details  Name: Tonya Chandler MRN: [id number] Date of Birth: 1960-10-07 Referring Provider (PT): Frederik Pear, MD   Encounter Date: 08/17/2020   PT End of Session - 08/17/20 1031     Visit Number 11    Number of Visits 12    Date for PT Re-Evaluation 08/21/20    Authorization Type BCBS 60 visits/year combined    PT Start Time 1025   pt arrived late   PT Stop Time 1105    PT Time Calculation (min) 40 min    Equipment Utilized During Treatment --   Rt knee brace during standing exercises.   Activity Tolerance Patient tolerated treatment well    Behavior During Therapy [facility] for tasks assessed/performed             Past Medical History:  Diagnosis Date   Abnormal ECG 04/01/2018   Abnormal stress echocardiogram 05/24/2019   Formatting of this note might be different from the original. Added automatically from request for surgery [id number]   Abnormal stress test 05/24/2019   Formatting of this note might be different from the original. Added automatically from request for surgery [id number]   Anemia    Angina pectoris ([place]) 06/04/2019   Anxiety    Atherosclerosis of native coronary artery with angina pectoris ([place]) 05/10/2015   Formatting of this note might be different from the original. 30% lesion-Chiu   Bilateral carpal tunnel syndrome 05/23/2017   Bilateral knee pain 02/16/2010   Qualifier: Diagnosis of  By: Koleen Nimrod MD, Jeffrey     Carpal tunnel syndrome, right 12/16/2017   Colon polyps    Depression    DEPRESSION 02/16/2010   Qualifier: Diagnosis of  By: Koleen Nimrod MD, Dellis Filbert     Depression, major, in partial remission ([place]) 07/14/2015   Diabetes mellitus    Diverticulosis    Dupuytren's contracture of right hand 06/24/2017   Essential hypertension 02/16/2010   Qualifier: Diagnosis of  By: Koleen Nimrod MD,  Dellis Filbert     Gastro-esophageal reflux    Gastrointestinal ulcer    Generalized anxiety disorder 03/14/2015   GERD 02/16/2010   Qualifier: Diagnosis of  By: Koleen Nimrod MD, Dellis Filbert     H/O: GI bleed    Said it was due to taking metformin   Hyperlipidemia    Hyperlipidemia LDL goal <70 02/16/2010   Qualifier: Diagnosis of  By: Koleen Nimrod MD, Dellis Filbert     Hypertension    IBS (irritable bowel syndrome)    Impingement syndrome of shoulder, right 06/24/2017   Midline low back pain with left-sided sciatica 12/29/2013   Musculoskeletal chest pain 05/04/2015   Obesity    Post-nasal drainage 02/10/2020   Last Assessment & Plan:  Formatting of this note might be different from the original. Concern over postnasal drainage. Throat awareness symptoms ever since she was packed in the right nostril last summer.  Was treated with antibiotics with some improvement.  Causes intermittent hoarseness and loss of voice.  Non-smoker. EXAM by anterior rhinoscopy shows no obvious polyps or purulence or obstructi   Precordial pain 05/04/2015   Primary insomnia 03/14/2015   Radiculitis of right cervical region 01/20/2018   Radiculopathy 04/02/2018   Right hand pain 06/24/2017   RLS (restless legs syndrome) 07/14/2015   Type 2 diabetes mellitus with diabetic neuropathy, with long-term current use of insulin ([place]) 02/16/2010   Qualifier: Diagnosis of  By: Koleen Nimrod MD, Jeffrey     Unstable angina [facility]) 06/11/2019    Past Surgical History:  Procedure Laterality Date   ABDOMINAL HYSTERECTOMY     ANTERIOR CERVICAL DECOMP/DISCECTOMY FUSION N/A 04/02/2018   Procedure: ANTERIOR CERVICAL DECOMPRESSION FUSION, CERVICAL FOUR-FIVE, CERVICAL FIVE-SIX, WITH INSTRUMENTATION AND ALLOGRAFT;  Surgeon: Phylliss Bob, MD;  Location: [place];  Service: Orthopedics;  Laterality: N/A;   COLONOSCOPY WITH ESOPHAGOGASTRODUODENOSCOPY (EGD)  05/04/2009   CORONARY ARTERY BYPASS GRAFT N/A 06/14/2019   Procedure: CORONARY ARTERY BYPASS GRAFTING (CABG) using  LIMA to LAD; Endoscopic Right Greater Saphenous Vein Harvest: SVG to Circ (distal); SVG to RCA (distal).;  Surgeon: [name], MD;  Location: [place];  Service: Open Heart Surgery;  Laterality: N/A;   ENDOVEIN HARVEST OF GREATER SAPHENOUS VEIN Right 06/14/2019   Procedure: [place] Ropes Of Greater Saphenous Vein;  Surgeon: [name], MD;  Location: [place];  Service: Open Heart Surgery;  Laterality: Right;   LEFT HEART CATH AND CORONARY ANGIOGRAPHY N/A 06/11/2019   Procedure: LEFT HEART CATH AND CORONARY ANGIOGRAPHY;  Surgeon: Wellington Hampshire, MD;  Location: [place] [facility];  Service: Cardiovascular;  Laterality: N/A;   PIP JOINT FUSION Bilateral    pinky   TEE WITHOUT CARDIOVERSION N/A 06/14/2019   Procedure: TRANSESOPHAGEAL ECHOCARDIOGRAM (TEE);  Surgeon: [name], MD;  Location: [place];  Service: Open Heart Surgery;  Laterality: N/A;    There were no vitals filed for this visit.   Subjective Assessment - 08/17/20 1028     Subjective Pt reports she stood for 3 hrs yesterday, and had some swelling but it resolved overnight.  She reports that when she her knee is sore, it usually feels better to move it.    Pertinent History CABG 2021, diabetes, neuropathy, HTN    Currently in Pain? No/denies    Pain Score 0-No pain                [facility] PT Assessment - 08/17/20 0001       Assessment   Medical Diagnosis strengthen quad, MCL    Referring Provider (PT) Frederik Pear, MD    Onset Date/Surgical Date 06/27/20    Hand Dominance Left    Next MD Visit 08/25/20               [facility] Adult PT Treatment/Exercise - 08/17/20 0001       Knee/Hip Exercises: Stretches   Passive Hamstring Stretch Right;2 reps;30 seconds    Quad Stretch Right;2 reps;30 seconds    Gastroc Stretch Right;3 reps;20 seconds      Knee/Hip Exercises: Aerobic   Recumbent Bike full slow revolutions to L1 - 6 min total      Knee/Hip Exercises: Standing   Heel Raises Both;1 set;10 reps     Lateral Step Up Right;1 set;10 reps;Step Height: 4";Hand Hold: 2    Forward Step Up Right;2 sets;5 reps;Hand Hold: 2;Step Height: 4";Step Height: 6"    Step Down Left;1 set;10 reps    Wall Squat 15 reps    Wall Squat Limitations not quite 90 deg knee bend.    SLS SLS on blue pad x 20 sec without UE support .    Other Standing Knee Exercises *brace donned for standing exercises    Other Standing Knee Exercises semi-tandem stance on blue pads with horiz/vertical head turns.      Vasopneumatic   Number Minutes Vasopneumatic  10 minutes    Vasopnuematic Location  Knee   Rt  Vasopneumatic Pressure Low    Vasopneumatic Temperature  34               PT Long Term Goals - 07/27/20 1330       PT LONG TERM GOAL #1   Title The patient will be indep with HEP.    Time 6    Period Weeks    Status On-going      PT LONG TERM GOAL #2   Title The patient will improve AROM from 14-80 up to 3-110 degrees.    Baseline 30110 on 07/27/20    Time 6    Period Weeks    Status Achieved      PT LONG TERM GOAL #3   Title The patient will improve functional status score from 29 up to 57%.    Time 6    Period Weeks    Status On-going      PT LONG TERM GOAL #4   Title The patient will improve R LE strength to tolerate SLR without brace x 15 reps.    Baseline performed without weight    Time 6    Period Weeks    Status Achieved      PT LONG TERM GOAL #5   Title The patient will return to gait without device for home and limited community without increased pain.    Time 6    Period Weeks    Status On-going                   Plan - 08/17/20 1100     Clinical Impression Statement Ongoing improvement in Rt knee mobility.  She reported some increase in discomfort with 6" steps, so 4" and bosu were utilized with good tolerance.  Standing exercises completed with knee brace donned.  Making good gains towards remaining goals.    Rehab Potential Good    PT Frequency 2x / week    PT  Treatment/Interventions ADLs/Self Care Home Management;Dry needling;Manual techniques;Gait training;Stair training;Vasopneumatic Device;Aquatic Therapy;Electrical Stimulation;Cryotherapy;Functional mobility training;Therapeutic activities;Therapeutic exercise;Balance training;Neuromuscular re-education;Patient/family education;Passive range of motion;Taping    PT Next Visit Plan end of POC. assess goals - MD note.    PT Home Exercise Plan Access Code: [email or web address]    Consulted and Agree with Plan of Care Patient             Patient will benefit from skilled therapeutic intervention in order to improve the following deficits and impairments:  Pain, Hypomobility, Impaired flexibility, Decreased strength, Decreased range of motion, Difficulty walking, Increased fascial restricitons, Impaired tone, Decreased activity tolerance, Postural dysfunction, Increased edema  Visit Diagnosis: Acute pain of right knee  Muscle weakness (generalized)  Localized edema  Other abnormalities of gait and mobility     Problem List Patient Active Problem List   Diagnosis Date Noted   Obesity    IBS (irritable bowel syndrome)    Hypertension    Hyperlipidemia    H/O: GI bleed    Gastrointestinal ulcer    Gastro-esophageal reflux    Diverticulosis    Depression    Colon polyps    Anxiety    Anemia    Post-nasal drainage 02/10/2020   S/P CABG x 3 LIMA to LAD, SVG sequential to RCA and circumflex in May 2021    Unstable angina ([place]) 06/11/2019   Angina pectoris ([place]) 06/04/2019   Abnormal stress test 05/24/2019   Abnormal stress echocardiogram 05/24/2019   Radiculopathy 04/02/2018   Abnormal ECG 04/01/2018   Radiculitis of  right cervical region 01/20/2018   Carpal tunnel syndrome, right 12/16/2017   Right hand pain 06/24/2017   Impingement syndrome of shoulder, right 06/24/2017   Dupuytren's contracture of right hand 06/24/2017   Bilateral carpal tunnel syndrome 05/23/2017   Depression, major,  in partial remission ([place]) 07/14/2015   RLS (restless legs syndrome) 07/14/2015   Atherosclerosis of native coronary artery with angina pectoris ([place]) 05/10/2015   Musculoskeletal chest pain 05/04/2015   Precordial pain 05/04/2015   Generalized anxiety disorder 03/14/2015   Primary insomnia 03/14/2015   Type 2 diabetes mellitus with diabetic neuropathy, with long-term current use of insulin ([place]) 02/16/2010   Hyperlipidemia LDL goal <70 02/16/2010   Essential hypertension 02/16/2010   GERD 02/16/2010   Bilateral knee pain 02/16/2010    Kerin Perna, PTA 08/17/20 1:16 PM  [place], [name], [address] Phone: [phone]   Fax:  [phone]  Name: Tonya Chandler MRN: [id number] Date of Birth: 1960/08/20

## 2020-08-21 ENCOUNTER — Other Ambulatory Visit: Payer: Self-pay

## 2020-08-21 ENCOUNTER — Ambulatory Visit (INDEPENDENT_AMBULATORY_CARE_PROVIDER_SITE_OTHER): Payer: BC Managed Care – PPO | Admitting: Rehabilitative and Restorative Service Providers"

## 2020-08-21 DIAGNOSIS — M6281 Muscle weakness (generalized): Secondary | ICD-10-CM | POA: Diagnosis not present

## 2020-08-21 DIAGNOSIS — M25561 Pain in right knee: Secondary | ICD-10-CM

## 2020-08-21 DIAGNOSIS — R600 Localized edema: Secondary | ICD-10-CM

## 2020-08-21 DIAGNOSIS — R2689 Other abnormalities of gait and mobility: Secondary | ICD-10-CM | POA: Diagnosis not present

## 2020-08-21 NOTE — Patient Instructions (Signed)
Access Code: [email or web address] URL: [email or web address] Date: 08/21/2020 Prepared by: Rudell Cobb  Exercises Supine Active Straight Leg Raise - 2 x daily - 7 x weekly - 1 sets - 10-12 reps - 3 seconds hold Prone Knee Flexion - 2 x daily - 7 x weekly - 1 sets - 10 reps Single Leg Balance with Clock Reach - 2 x daily - 7 x weekly - 1 sets - 5 reps Sidestepping - 2 x daily - 7 x weekly - 1 sets - 10 reps Seated Hamstring Stretch with Chair - 2 x daily - 7 x weekly - 1 sets - 10 reps  Patient Education Trigger Point Dry Needling

## 2020-08-21 NOTE — Therapy (Signed)
[facility] [place], [name], [address] Phone: [phone]   Fax:  [phone]  Physical Therapy Treatment and Renewal Summary  Patient Details  Name: Tonya Chandler MRN: [id number] Date of Birth: 28-Mar-1960 Referring Provider (PT): Frederik Pear, MD   Encounter Date: 08/21/2020   PT End of Session - 08/21/20 1057     Visit Number 12    Number of Visits 12    Date for PT Re-Evaluation 08/21/20    Authorization Type [employer] 60 visits/year combined    PT Start Time 1100    PT Stop Time 1149    PT Time Calculation (min) 49 min    Equipment Utilized During Treatment --   Rt knee brace during standing exercises.   Activity Tolerance Patient tolerated treatment well    Behavior During Therapy [facility] for tasks assessed/performed             Past Medical History:  Diagnosis Date   Abnormal ECG 04/01/2018   Abnormal stress echocardiogram 05/24/2019   Formatting of this note might be different from the original. Added automatically from request for surgery [id number]   Abnormal stress test 05/24/2019   Formatting of this note might be different from the original. Added automatically from request for surgery [id number]   Anemia    Angina pectoris ([place]) 06/04/2019   Anxiety    Atherosclerosis of native coronary artery with angina pectoris ([place]) 05/10/2015   Formatting of this note might be different from the original. 30% lesion-Chiu   Bilateral carpal tunnel syndrome 05/23/2017   Bilateral knee pain 02/16/2010   Qualifier: Diagnosis of  By: Koleen Nimrod MD, Jeffrey     Carpal tunnel syndrome, right 12/16/2017   Colon polyps    Depression    DEPRESSION 02/16/2010   Qualifier: Diagnosis of  By: Koleen Nimrod MD, Dellis Filbert     Depression, major, in partial remission ([place]) 07/14/2015   Diabetes mellitus    Diverticulosis    Dupuytren's contracture of right hand 06/24/2017   Essential hypertension 02/16/2010   Qualifier: Diagnosis of  By: Koleen Nimrod  MD, Dellis Filbert     Gastro-esophageal reflux    Gastrointestinal ulcer    Generalized anxiety disorder 03/14/2015   GERD 02/16/2010   Qualifier: Diagnosis of  By: Koleen Nimrod MD, Dellis Filbert     H/O: GI bleed    Said it was due to taking metformin   Hyperlipidemia    Hyperlipidemia LDL goal <70 02/16/2010   Qualifier: Diagnosis of  By: Koleen Nimrod MD, Dellis Filbert     Hypertension    IBS (irritable bowel syndrome)    Impingement syndrome of shoulder, right 06/24/2017   Midline low back pain with left-sided sciatica 12/29/2013   Musculoskeletal chest pain 05/04/2015   Obesity    Post-nasal drainage 02/10/2020   Last Assessment & Plan:  Formatting of this note might be different from the original. Concern over postnasal drainage. Throat awareness symptoms ever since she was packed in the right nostril last summer.  Was treated with antibiotics with some improvement.  Causes intermittent hoarseness and loss of voice.  Non-smoker. EXAM by anterior rhinoscopy shows no obvious polyps or purulence or obstructi   Precordial pain 05/04/2015   Primary insomnia 03/14/2015   Radiculitis of right cervical region 01/20/2018   Radiculopathy 04/02/2018   Right hand pain 06/24/2017   RLS (restless legs syndrome) 07/14/2015   Type 2 diabetes mellitus with diabetic neuropathy, with long-term current use of insulin ([place]) 02/16/2010   Qualifier: Diagnosis of  By: Koleen Nimrod MD, Jeffrey     Unstable angina [facility]) 06/11/2019    Past Surgical History:  Procedure Laterality Date   ABDOMINAL HYSTERECTOMY     ANTERIOR CERVICAL DECOMP/DISCECTOMY FUSION N/A 04/02/2018   Procedure: ANTERIOR CERVICAL DECOMPRESSION FUSION, CERVICAL FOUR-FIVE, CERVICAL FIVE-SIX, WITH INSTRUMENTATION AND ALLOGRAFT;  Surgeon: Phylliss Bob, MD;  Location: [place];  Service: Orthopedics;  Laterality: N/A;   COLONOSCOPY WITH ESOPHAGOGASTRODUODENOSCOPY (EGD)  05/04/2009   CORONARY ARTERY BYPASS GRAFT N/A 06/14/2019   Procedure: CORONARY ARTERY BYPASS GRAFTING (CABG)  using LIMA to LAD; Endoscopic Right Greater Saphenous Vein [place]: SVG to Circ (distal); SVG to RCA (distal).;  Surgeon: [name], MD;  Location: [place];  Service: Open Heart Surgery;  Laterality: N/A;   ENDOVEIN HARVEST OF GREATER SAPHENOUS VEIN Right 06/14/2019   Procedure: [place] Of Greater Saphenous Vein;  Surgeon: [name], MD;  Location: [place];  Service: Open Heart Surgery;  Laterality: Right;   LEFT HEART CATH AND CORONARY ANGIOGRAPHY N/A 06/11/2019   Procedure: LEFT HEART CATH AND CORONARY ANGIOGRAPHY;  Surgeon: Wellington Hampshire, MD;  Location: [place] [facility];  Service: Cardiovascular;  Laterality: N/A;   PIP JOINT FUSION Bilateral    pinky   TEE WITHOUT CARDIOVERSION N/A 06/14/2019   Procedure: TRANSESOPHAGEAL ECHOCARDIOGRAM (TEE);  Surgeon: [name], MD;  Location: [place];  Service: Open Heart Surgery;  Laterality: N/A;    There were no vitals filed for this visit.   Subjective Assessment - 08/21/20 1101     Subjective The patient experiences very mild pain worse when pivoting in weight bearing.   She also notes a sensation of knee giving way when she rotates in weight bearing.    Pertinent History CABG 2021, diabetes, neuropathy, HTN    Patient Stated Goals Strengthen knee, no pain, full use of R leg    Currently in Pain? Yes    Pain Score 3     Pain Location Knee    Pain Orientation Right    Pain Descriptors / Indicators Sharp   "minor sharp pain, nothing like what I've had."   Pain Type Acute pain    Pain Onset More than a month ago    Pain Frequency Intermittent    Aggravating Factors  pivoting in weight bearing    Pain Relieving Factors ice, rest                [facility] PT Assessment - 08/21/20 1100       Assessment   Medical Diagnosis strengthen quad, MCL    Referring Provider (PT) Frederik Pear, MD    Onset Date/Surgical Date 06/27/20    Next MD Visit 08/25/20                           [facility]  Treatment/Exercise - 08/21/20 1100       Ambulation/Gait   Ambulation/Gait Yes    Gait Comments walked without the brace short distances in the clinic      Exercises   Exercises Knee/Hip      Knee/Hip Exercises: Stretches   Active Hamstring Stretch Right;2 reps;30 seconds    Gastroc Stretch Right;1 rep;30 seconds      Knee/Hip Exercises: Aerobic   Recumbent Bike full revolutions x 5 minutes      Knee/Hip Exercises: Standing   Heel Raises Right;10 reps    Heel Raises Limitations Moved to single leg stance today-- some R foot  pain (notes arthritis R foot)    Hip Abduction Stengthening;Both    Abduction Limitations in sidesstep with squat position, no brace    Lateral Step Up Right;1 set;10 reps;Step Height: 4"    Lateral Step Up Limitations notes some discomfort superior patella    Forward Step Up Right;10 reps;Step Height: 4"    Forward Step Up Limitations catches R knee after multiple reps    SLS SLS on blue foam pad x 15 seconds holds with intermittent UE Support    SLS with Vectors reaching clock pattern with L LE    Other Standing Knee Exercises removed brace today during standing tasks-- got some discomfort with forward step ups      Manual Therapy   Manual Therapy Soft tissue mobilization    Manual therapy comments skilled palpation to assess response to STM and DN    Soft tissue mobilization STM, IASTM to medial quads, adductor, and medial HS    Kinesiotex Inhibit Muscle      Kinesiotix   Facilitate Muscle  I strip VMO and medial HS              Trigger Point Dry Needling - 08/21/20 1224     Consent Given? Yes    Education Handout Provided Yes    Muscles Treated Lower Quadrant Vastus medialis;Hamstring    Dry Needling Comments R side    Vastus medialis Response Twitch response elicited;Palpable increased muscle length    Hamstring Response Twitch response elicited;Palpable increased muscle length                  PT Education - 08/21/20 1207      Education Details Progressed HEP to include more standing strength    Person(s) Educated Patient    Methods Explanation;Demonstration;Handout    Comprehension Verbalized understanding;Returned demonstration                 PT Long Term Goals - 08/21/20 1208       PT LONG TERM GOAL #1   Title The patient will be indep with HEP.    Time 6    Period Weeks    Status Achieved      PT LONG TERM GOAL #2   Title The patient will improve AROM from 14-80 up to 3-110 degrees.    Baseline 3-110 on 07/27/20    Time 6    Period Weeks    Status Achieved      PT LONG TERM GOAL #3   Title The patient will improve functional status score from 29 up to 57%.    Baseline measured on 08/03/20 improving from 29% up to 45%    Time 6    Period Weeks    Status Partially Met      PT LONG TERM GOAL #4   Title The patient will improve R LE strength to tolerate SLR without brace x 15 reps.    Baseline performed without weight    Time 6    Period Weeks    Status Achieved      PT LONG TERM GOAL #5   Title The patient will return to gait without device for home and limited community without increased pain.    Baseline patient does household ambulation without a device    Time 6    Period Weeks    Status Achieved             UPDATED LONG TERM GOALS:  PT Long Term Goals - 08/21/20  [place] #1   Title The patient will be indep with HEP progression.    Time 6    Period Weeks    Status Revised    Target Date 10/02/20      PT LONG TERM GOAL #2   Title The patient will negotiate steps with reciprocal pattern and one handrail.    Time 6    Period Weeks    Status New    Target Date 10/02/20      PT LONG TERM GOAL #3   Title The patient will improve functional status score from 45% up to 57%.    Baseline measured on 08/03/20 improving from 29% up to 45%    Time 6    Period Weeks    Status Revised    Target Date 10/02/20      PT LONG TERM GOAL #4   Title The  patient will return to full community ambulation without an assistive device.    Time 6    Period Weeks    Status New    Target Date 10/02/20      PT LONG TERM GOAL #5   Title The patient will demo a squat to pick up a pen from the floor and return to stand to demo functional strength.    Time 6    Period Weeks    Status New    Target Date 10/02/20                  Plan - 08/21/20 1209     Clinical Impression Statement The patient has partially met LTGs.  PT continuing to progress HEP modifying today to standing activities.  Also performed dry needling today due to continued tightness in medial hamstrings, adductors and vastus medialis.  PT also modified home hamstring stretch hoping to gain compliance.  Plan to continue working on LE strengthening, progression of dynamic gait, and functional mobility.    PT Frequency 2x / week    PT Duration 6 weeks    PT Treatment/Interventions ADLs/Self Care Home Management;Dry needling;Manual techniques;Gait training;Stair training;Vasopneumatic Device;Aquatic Therapy;Electrical Stimulation;Cryotherapy;Functional mobility training;Therapeutic activities;Therapeutic exercise;Balance training;Neuromuscular re-education;Patient/family education;Passive range of motion;Taping    PT Next Visit Plan Continue working on LE strengthening, STM/DN, flexibility, and gait training    PT Home Exercise Plan Access Code: [email or web address]    Consulted and Agree with Plan of Care Patient             Patient will benefit from skilled therapeutic intervention in order to improve the following deficits and impairments:  Pain, Hypomobility, Impaired flexibility, Decreased strength, Decreased range of motion, Difficulty walking, Increased fascial restricitons, Impaired tone, Decreased activity tolerance, Postural dysfunction, Increased edema  Visit Diagnosis: Acute pain of right knee  Muscle weakness (generalized)  Localized edema  Other abnormalities of gait  and mobility     Problem List Patient Active Problem List   Diagnosis Date Noted   Obesity    IBS (irritable bowel syndrome)    Hypertension    Hyperlipidemia    H/O: GI bleed    Gastrointestinal ulcer    Gastro-esophageal reflux    Diverticulosis    Depression    Colon polyps    Anxiety    Anemia    Post-nasal drainage 02/10/2020   S/P CABG x 3 LIMA to LAD, SVG sequential to RCA and circumflex in May 2021    Unstable angina ([place]) 06/11/2019   Angina pectoris ([place])  06/04/2019   Abnormal stress test 05/24/2019   Abnormal stress echocardiogram 05/24/2019   Radiculopathy 04/02/2018   Abnormal ECG 04/01/2018   Radiculitis of right cervical region 01/20/2018   Carpal tunnel syndrome, right 12/16/2017   Right hand pain 06/24/2017   Impingement syndrome of shoulder, right 06/24/2017   Dupuytren's contracture of right hand 06/24/2017   Bilateral carpal tunnel syndrome 05/23/2017   Depression, major, in partial remission ([place]) 07/14/2015   RLS (restless legs syndrome) 07/14/2015   Atherosclerosis of native coronary artery with angina pectoris ([place]) 05/10/2015   Musculoskeletal chest pain 05/04/2015   Precordial pain 05/04/2015   Generalized anxiety disorder 03/14/2015   Primary insomnia 03/14/2015   Type 2 diabetes mellitus with diabetic neuropathy, with long-term current use of insulin ([place]) 02/16/2010   Hyperlipidemia LDL goal <70 02/16/2010   Essential hypertension 02/16/2010   GERD 02/16/2010   Bilateral knee pain 02/16/2010    Rudell Cobb, MPT  Amish Mintzer 08/21/2020, 12:24 PM  [facility] [address] [place], [name], [address] Phone: [phone]   Fax:  [phone]  Name: Tonya Chandler MRN: [id number] Date of Birth: 09-26-1960

## 2020-08-24 ENCOUNTER — Other Ambulatory Visit: Payer: Self-pay

## 2020-08-24 ENCOUNTER — Ambulatory Visit (INDEPENDENT_AMBULATORY_CARE_PROVIDER_SITE_OTHER): Payer: BC Managed Care – PPO | Admitting: Rehabilitative and Restorative Service Providers"

## 2020-08-24 DIAGNOSIS — M6281 Muscle weakness (generalized): Secondary | ICD-10-CM | POA: Diagnosis not present

## 2020-08-24 DIAGNOSIS — R2689 Other abnormalities of gait and mobility: Secondary | ICD-10-CM | POA: Diagnosis not present

## 2020-08-24 DIAGNOSIS — M25561 Pain in right knee: Secondary | ICD-10-CM

## 2020-08-24 DIAGNOSIS — R600 Localized edema: Secondary | ICD-10-CM | POA: Diagnosis not present

## 2020-08-24 DIAGNOSIS — S83281A Other tear of lateral meniscus, current injury, right knee, initial encounter: Secondary | ICD-10-CM | POA: Diagnosis not present

## 2020-08-24 NOTE — Therapy (Signed)
[place], [name], [address] Phone: [phone]   Fax:  [phone]  Physical Therapy Treatment  Patient Details  Name: Tonya Chandler MRN: [id number] Date of Birth: 1960-03-04 Referring Provider (PT): Frederik Pear, MD   Encounter Date: 08/24/2020   PT End of Session - 08/24/20 1144     Visit Number 13    Number of Visits 24    Date for PT Re-Evaluation 10/02/20    Authorization Type BCBS 60 visits/year combined    Authorization - Visit Number 13    Authorization - Number of Visits 60    PT Start Time 1100    PT Stop Time 1150    PT Time Calculation (min) 50 min    Equipment Utilized During Treatment --   Rt knee brace during standing exercises.   Activity Tolerance Patient tolerated treatment well    Behavior During Therapy [facility] for tasks assessed/performed             Past Medical History:  Diagnosis Date   Abnormal ECG 04/01/2018   Abnormal stress echocardiogram 05/24/2019   Formatting of this note might be different from the original. Added automatically from request for surgery [id number]   Abnormal stress test 05/24/2019   Formatting of this note might be different from the original. Added automatically from request for surgery [id number]   Anemia    Angina pectoris ([place]) 06/04/2019   Anxiety    Atherosclerosis of native coronary artery with angina pectoris ([place]) 05/10/2015   Formatting of this note might be different from the original. 30% lesion-Chiu   Bilateral carpal tunnel syndrome 05/23/2017   Bilateral knee pain 02/16/2010   Qualifier: Diagnosis of  By: Koleen Nimrod MD, [place]     Carpal tunnel syndrome, right 12/16/2017   Colon polyps    Depression    DEPRESSION 02/16/2010   Qualifier: Diagnosis of  By: Koleen Nimrod MD, Dellis Filbert     Depression, major, in partial remission ([place]) 07/14/2015   Diabetes mellitus    Diverticulosis    Dupuytren's contracture of right hand 06/24/2017   Essential  hypertension 02/16/2010   Qualifier: Diagnosis of  By: Koleen Nimrod MD, Dellis Filbert     Gastro-esophageal reflux    Gastrointestinal ulcer    Generalized anxiety disorder 03/14/2015   GERD 02/16/2010   Qualifier: Diagnosis of  By: Koleen Nimrod MD, Dellis Filbert     H/O: GI bleed    Said it was due to taking metformin   Hyperlipidemia    Hyperlipidemia LDL goal <70 02/16/2010   Qualifier: Diagnosis of  By: Koleen Nimrod MD, Dellis Filbert     Hypertension    IBS (irritable bowel syndrome)    Impingement syndrome of shoulder, right 06/24/2017   Midline low back pain with left-sided sciatica 12/29/2013   Musculoskeletal chest pain 05/04/2015   Obesity    Post-nasal drainage 02/10/2020   Last Assessment & Plan:  Formatting of this note might be different from the original. Concern over postnasal drainage. Throat awareness symptoms ever since she was packed in the right nostril last summer.  Was treated with antibiotics with some improvement.  Causes intermittent hoarseness and loss of voice.  Non-smoker. EXAM by anterior rhinoscopy shows no obvious polyps or purulence or obstructi   Precordial pain 05/04/2015   Primary insomnia 03/14/2015   Radiculitis of right cervical region 01/20/2018   Radiculopathy 04/02/2018   Right hand pain 06/24/2017   RLS (restless legs syndrome) 07/14/2015   Type 2 diabetes mellitus with diabetic neuropathy,  with long-term current use of insulin ([place]) 02/16/2010   Qualifier: Diagnosis of  By: Koleen Nimrod MD, Dellis Filbert     Unstable angina [facility]) 06/11/2019    Past Surgical History:  Procedure Laterality Date   ABDOMINAL HYSTERECTOMY     ANTERIOR CERVICAL DECOMP/DISCECTOMY FUSION N/A 04/02/2018   Procedure: ANTERIOR CERVICAL DECOMPRESSION FUSION, CERVICAL FOUR-FIVE, CERVICAL FIVE-SIX, WITH INSTRUMENTATION AND ALLOGRAFT;  Surgeon: Phylliss Bob, MD;  Location: [place];  Service: Orthopedics;  Laterality: N/A;   COLONOSCOPY WITH ESOPHAGOGASTRODUODENOSCOPY (EGD)  05/04/2009   CORONARY ARTERY BYPASS GRAFT N/A  06/14/2019   Procedure: CORONARY ARTERY BYPASS GRAFTING (CABG) using LIMA to LAD; Endoscopic Right Greater Saphenous Vein Harvest: SVG to Circ (distal); SVG to RCA (distal).;  Surgeon: [name], MD;  Location: [place];  Service: Open Heart Surgery;  Laterality: N/A;   ENDOVEIN HARVEST OF GREATER SAPHENOUS VEIN Right 06/14/2019   Procedure: [place]opes Of Greater Saphenous Vein;  Surgeon: [name], MD;  Location: [place];  Service: Open Heart Surgery;  Laterality: Right;   LEFT HEART CATH AND CORONARY ANGIOGRAPHY N/A 06/11/2019   Procedure: LEFT HEART CATH AND CORONARY ANGIOGRAPHY;  Surgeon: Wellington Hampshire, MD;  Location: [place] [facility];  Service: Cardiovascular;  Laterality: N/A;   PIP JOINT FUSION Bilateral    pinky   TEE WITHOUT CARDIOVERSION N/A 06/14/2019   Procedure: TRANSESOPHAGEAL ECHOCARDIOGRAM (TEE);  Surgeon: [name], MD;  Location: [place];  Service: Open Heart Surgery;  Laterality: N/A;    There were no vitals filed for this visit.   Subjective Assessment - 08/24/20 1105     Subjective The patient reports that she is having intermittent knee pain.  She fears the steps b/c she thinks the R knee could buckle.  She has been busy at home due to cleaning out her daughter's appartment.    Pertinent History CABG 2021, diabetes, neuropathy, HTN    Patient Stated Goals Strengthen knee, no pain, full use of R leg    Currently in Pain? No/denies                [facility] PT Assessment - 08/24/20 1107       Assessment   Medical Diagnosis strengthen quad, MCL    Referring Provider (PT) Frederik Pear, MD    Onset Date/Surgical Date 06/27/20    Hand Dominance Left    Next MD Visit 08/24/20                           [facility] Adult PT Treatment/Exercise - 08/24/20 1107       Ambulation/Gait   Ambulation/Gait Yes    Ambulation/Gait Assistance 7: Independent    Ambulation Distance (Feet) 300 Feet    Assistive device None    Stairs Yes     Stairs Assistance 6: Modified independent (Device/Increase time)    Stair Management Technique One rail Right;Step to pattern;Alternating pattern    Gait Comments walking without SPC today; did stairs with brace donned; patient is walking more at home without brace      Exercises   Exercises Knee/Hip      Knee/Hip Exercises: Stretches   Active Hamstring Stretch Right;1 rep;60 seconds    Quad Stretch Right;2 reps;30 seconds    Quad Stretch Limitations added to HEP; worked on Probation officer for quad      Knee/Hip Exercises: Aerobic   Recumbent Bike full revolutions x 2-3 minutes      Knee/Hip Exercises:  Standing   Heel Raises Both;20 reps    Heel Raises Limitations without UE support    Knee Flexion Strengthening;Right;10 reps    Knee Flexion Limitations felt tight initially-- stretched and then re-attempted    Hip Flexion Stengthening;Right;Left;5 reps    Hip Flexion Limitations mraching through agility ladder with knee brace donned    Hip Abduction Stengthening;Both    Abduction Limitations sidestepping R and L    Lateral Step Up Right;1 set;10 reps;Step Height: 4"    Lateral Step Up Limitations with brace donned    Forward Step Up Right;10 reps;Step Height: 4"    Forward Step Up Limitations with brace donned; feels distal pateller pain    Wall Squat 10 reps    Wall Squat Limitations brace donned    Stairs Stair negotiation working on reciprocal pattern with brace donned    SLS SLS without brace donned x 15 seconds    Gait Training Ambulation x 300 feet t/o [facility] to stairs    Other Standing Knee Exercises agility ladder wide to narrow, stepping forward/backwards/lateral outside of boxes, marching through and longer stride through agility ladder *used knee brace due to discomfort      Cryotherapy   Number Minutes Cryotherapy 10 Minutes   no charge-- stayed at end of session for ice   Cryotherapy Location Knee    Type of Cryotherapy Ice pack      Manual Therapy   Manual  Therapy Soft tissue mobilization    Soft tissue mobilization STM R knee HS and quads + adductor                         PT Long Term Goals - 08/21/20 1225       PT LONG TERM GOAL #1   Title The patient will be indep with HEP progression.    Time 6    Period Weeks    Status Revised    Target Date 10/02/20      PT LONG TERM GOAL #2   Title The patient will negotiate steps with reciprocal pattern and one handrail.    Time 6    Period Weeks    Status New    Target Date 10/02/20      PT LONG TERM GOAL #3   Title The patient will improve functional status score from 45% up to 57%.    Baseline measured on 08/03/20 improving from 29% up to 45%    Time 6    Period Weeks    Status Revised    Target Date 10/02/20      PT LONG TERM GOAL #4   Title The patient will return to full community ambulation without an assistive device.    Time 6    Period Weeks    Status New    Target Date 10/02/20      PT LONG TERM GOAL #5   Title The patient will demo a squat to pick up a pen from the floor and return to stand to demo functional strength.    Time 6    Period Weeks    Status New    Target Date 10/02/20                   Plan - 08/24/20 1258     Clinical Impression Statement The patient is walking some without brace at home.  She demonstrates stairs mod indep for step to and reciprocal pattern with brace donned.  PT continuing to work on standing balance, dynamic stepping activities for functional strengthening.    PT Frequency 2x / week    PT Duration 6 weeks    PT Treatment/Interventions ADLs/Self Care Home Management;Dry needling;Manual techniques;Gait training;Stair training;Vasopneumatic Device;Aquatic Therapy;Electrical Stimulation;Cryotherapy;Functional mobility training;Therapeutic activities;Therapeutic exercise;Balance training;Neuromuscular re-education;Patient/family education;Passive range of motion;Taping    PT Next Visit Plan Continue working on  LE strengthening, STM/DN, flexibility, and gait training    PT Home Exercise Plan Access Code: [email or web address]    Consulted and Agree with Plan of Care Patient             Patient will benefit from skilled therapeutic intervention in order to improve the following deficits and impairments:  Pain, Hypomobility, Impaired flexibility, Decreased strength, Decreased range of motion, Difficulty walking, Increased fascial restricitons, Impaired tone, Decreased activity tolerance, Postural dysfunction, Increased edema  Visit Diagnosis: Acute pain of right knee  Muscle weakness (generalized)  Localized edema  Other abnormalities of gait and mobility     Problem List Patient Active Problem List   Diagnosis Date Noted   Obesity    IBS (irritable bowel syndrome)    Hypertension    Hyperlipidemia    H/O: GI bleed    Gastrointestinal ulcer    Gastro-esophageal reflux    Diverticulosis    Depression    Colon polyps    Anxiety    Anemia    Post-nasal drainage 02/10/2020   S/P CABG x 3 LIMA to LAD, SVG sequential to RCA and circumflex in May 2021    Unstable angina ([place]) 06/11/2019   Angina pectoris ([place]) 06/04/2019   Abnormal stress test 05/24/2019   Abnormal stress echocardiogram 05/24/2019   Radiculopathy 04/02/2018   Abnormal ECG 04/01/2018   Radiculitis of right cervical region 01/20/2018   Carpal tunnel syndrome, right 12/16/2017   Right hand pain 06/24/2017   Impingement syndrome of shoulder, right 06/24/2017   Dupuytren's contracture of right hand 06/24/2017   Bilateral carpal tunnel syndrome 05/23/2017   Depression, major, in partial remission ([place]) 07/14/2015   RLS (restless legs syndrome) 07/14/2015   Atherosclerosis of native coronary artery with angina pectoris ([place]) 05/10/2015   Musculoskeletal chest pain 05/04/2015   Precordial pain 05/04/2015   Generalized anxiety disorder 03/14/2015   Primary insomnia 03/14/2015   Type 2 diabetes mellitus with diabetic neuropathy,  with long-term current use of insulin ([place]) 02/16/2010   Hyperlipidemia LDL goal <70 02/16/2010   Essential hypertension 02/16/2010   GERD 02/16/2010   Bilateral knee pain 02/16/2010   Rudell Cobb, MPT  [place] 08/24/2020, 12:59 PM  [facility] [address] [place], [name], [address] Phone: [phone]   Fax:  [phone]  Name: Tonya Chandler MRN: [id number] Date of Birth: [birth year]

## 2020-08-28 ENCOUNTER — Other Ambulatory Visit: Payer: Self-pay

## 2020-08-28 ENCOUNTER — Ambulatory Visit (INDEPENDENT_AMBULATORY_CARE_PROVIDER_SITE_OTHER): Payer: BC Managed Care – PPO | Admitting: Physical Therapy

## 2020-08-28 DIAGNOSIS — R2689 Other abnormalities of gait and mobility: Secondary | ICD-10-CM

## 2020-08-28 DIAGNOSIS — M6281 Muscle weakness (generalized): Secondary | ICD-10-CM | POA: Diagnosis not present

## 2020-08-28 DIAGNOSIS — M25561 Pain in right knee: Secondary | ICD-10-CM | POA: Diagnosis not present

## 2020-08-28 DIAGNOSIS — R600 Localized edema: Secondary | ICD-10-CM

## 2020-08-28 NOTE — Therapy (Signed)
[place], [name], [address] Phone: [phone]   Fax:  [phone]  Physical Therapy Treatment  Patient Details  Name: Tonya Chandler MRN: [id number] Date of Birth: [birth year] Referring Provider (PT): Frederik Pear, MD   Encounter Date: 08/28/2020   PT End of Session - 08/28/20 1107     Visit Number 14    Number of Visits 24    Date for PT Re-Evaluation 10/02/20    Authorization Type BCBS 60 visits/year combined    Authorization - Visit Number 14    Authorization - Number of Visits 62    PT Start Time 1100    PT Stop Time 1145   ice last 10 min   PT Time Calculation (min) 45 min    Activity Tolerance Patient tolerated treatment well    Behavior During Therapy [facility] for tasks assessed/performed             Past Medical History:  Diagnosis Date   Abnormal ECG 04/01/2018   Abnormal stress echocardiogram 05/24/2019   Formatting of this note might be different from the original. Added automatically from request for surgery [id number]   Abnormal stress test 05/24/2019   Formatting of this note might be different from the original. Added automatically from request for surgery [id number]   Anemia    Angina pectoris ([place]) 06/04/2019   Anxiety    Atherosclerosis of native coronary artery with angina pectoris ([place]) 05/10/2015   Formatting of this note might be different from the original. 30% lesion-Chiu   Bilateral carpal tunnel syndrome 05/23/2017   Bilateral knee pain 02/16/2010   Qualifier: Diagnosis of  By: Koleen Nimrod MD, [place]     Carpal tunnel syndrome, right 12/16/2017   Colon polyps    Depression    DEPRESSION 02/16/2010   Qualifier: Diagnosis of  By: Koleen Nimrod MD, Dellis Filbert     Depression, major, in partial remission ([place]) 07/14/2015   Diabetes mellitus    Diverticulosis    Dupuytren's contracture of right hand 06/24/2017   Essential hypertension 02/16/2010   Qualifier: Diagnosis of  By: Koleen Nimrod MD, Dellis Filbert      Gastro-esophageal reflux    Gastrointestinal ulcer    Generalized anxiety disorder 03/14/2015   GERD 02/16/2010   Qualifier: Diagnosis of  By: Koleen Nimrod MD, Dellis Filbert     H/O: GI bleed    Said it was due to taking metformin   Hyperlipidemia    Hyperlipidemia LDL goal <70 02/16/2010   Qualifier: Diagnosis of  By: Koleen Nimrod MD, Dellis Filbert     Hypertension    IBS (irritable bowel syndrome)    Impingement syndrome of shoulder, right 06/24/2017   Midline low back pain with left-sided sciatica 12/29/2013   Musculoskeletal chest pain 05/04/2015   Obesity    Post-nasal drainage 02/10/2020   Last Assessment & Plan:  Formatting of this note might be different from the original. Concern over postnasal drainage. Throat awareness symptoms ever since she was packed in the right nostril last summer.  Was treated with antibiotics with some improvement.  Causes intermittent hoarseness and loss of voice.  Non-smoker. EXAM by anterior rhinoscopy shows no obvious polyps or purulence or obstructi   Precordial pain 05/04/2015   Primary insomnia 03/14/2015   Radiculitis of right cervical region 01/20/2018   Radiculopathy 04/02/2018   Right hand pain 06/24/2017   RLS (restless legs syndrome) 07/14/2015   Type 2 diabetes mellitus with diabetic neuropathy, with long-term current use of insulin ([place]) 02/16/2010  Qualifier: Diagnosis of  By: Koleen Nimrod MD, Dellis Filbert     Unstable angina [facility]) 06/11/2019    Past Surgical History:  Procedure Laterality Date   ABDOMINAL HYSTERECTOMY     ANTERIOR CERVICAL DECOMP/DISCECTOMY FUSION N/A 04/02/2018   Procedure: ANTERIOR CERVICAL DECOMPRESSION FUSION, CERVICAL FOUR-FIVE, CERVICAL FIVE-SIX, WITH INSTRUMENTATION AND ALLOGRAFT;  Surgeon: Phylliss Bob, MD;  Location: [place];  Service: Orthopedics;  Laterality: N/A;   COLONOSCOPY WITH ESOPHAGOGASTRODUODENOSCOPY (EGD)  05/04/2009   CORONARY ARTERY BYPASS GRAFT N/A 06/14/2019   Procedure: CORONARY ARTERY BYPASS GRAFTING (CABG) using LIMA to LAD;  Endoscopic Right Greater Saphenous Vein Harvest: SVG to Circ (distal); SVG to RCA (distal).;  Surgeon: [name], MD;  Location: [place];  Service: Open Heart Surgery;  Laterality: N/A;   ENDOVEIN HARVEST OF GREATER SAPHENOUS VEIN Right 06/14/2019   Procedure: [place] Ropes Of Greater Saphenous Vein;  Surgeon: [name], MD;  Location: [place];  Service: Open Heart Surgery;  Laterality: Right;   LEFT HEART CATH AND CORONARY ANGIOGRAPHY N/A 06/11/2019   Procedure: LEFT HEART CATH AND CORONARY ANGIOGRAPHY;  Surgeon: Wellington Hampshire, MD;  Location: [place] [facility];  Service: Cardiovascular;  Laterality: N/A;   PIP JOINT FUSION Bilateral    pinky   TEE WITHOUT CARDIOVERSION N/A 06/14/2019   Procedure: TRANSESOPHAGEAL ECHOCARDIOGRAM (TEE);  Surgeon: [name], MD;  Location: [place];  Service: Open Heart Surgery;  Laterality: N/A;    There were no vitals filed for this visit.   Subjective Assessment - 08/28/20 1108     Subjective Pt reports that her Dr has released her to go back to work full time. She reports that she is walking more around house without brace on.    Patient Stated Goals Strengthen knee, no pain, full use of R leg    Currently in Pain? No/denies    Pain Score 0-No pain   just tightness               [facility] PT Assessment - 08/28/20 0001       Assessment   Medical Diagnosis strengthen quad, MCL    Referring Provider (PT) Frederik Pear, MD    Onset Date/Surgical Date 06/27/20    Hand Dominance Left    Next MD Visit 09/11/20              [facility] Adult PT Treatment/Exercise - 08/28/20 0001       Knee/Hip Exercises: Stretches   Quad Stretch Right;4 reps;30 seconds   prone   Gastroc Stretch Right;2 reps;20 seconds      Knee/Hip Exercises: Aerobic   Recumbent Bike L1-2: 5.5 min      Knee/Hip Exercises: Machines for Strengthening   Cybex Leg Press 5 plates x 10 reps BLE.2 sets      Knee/Hip Exercises: Standing   Heel Raises Both;20 reps     Heel Raises Limitations without UE support    Stairs Stair negotiation working on reciprocal pattern with brace donned    SLS SLS without brace donned x 20 seconds on mini-tramp with intermittent UE to support. 2 reps.    Other Standing Knee Exercises split squat x 10 each side, with UE on counter and limited depth to tolerance.  Side stepping with high knees R/L.    Other Standing Knee Exercises tandem stance on mini-tramp x 20 sec each leg forward.- without brace.  retro and forward tandem gait with brace donned.      Cryotherapy   Number Minutes Cryotherapy  10 Minutes   no charge   Cryotherapy Location Knee   Rt   Type of Cryotherapy Ice pack                         PT Long Term Goals - 08/21/20 1225       PT LONG TERM GOAL #1   Title The patient will be indep with HEP progression.    Time 6    Period Weeks    Status Revised    Target Date 10/02/20      PT LONG TERM GOAL #2   Title The patient will negotiate steps with reciprocal pattern and one handrail.    Time 6    Period Weeks    Status New    Target Date 10/02/20      PT LONG TERM GOAL #3   Title The patient will improve functional status score from 45% up to 57%.    Baseline measured on 08/03/20 improving from 29% up to 45%    Time 6    Period Weeks    Status Revised    Target Date 10/02/20      PT LONG TERM GOAL #4   Title The patient will return to full community ambulation without an assistive device.    Time 6    Period Weeks    Status New    Target Date 10/02/20      PT LONG TERM GOAL #5   Title The patient will demo a squat to pick up a pen from the floor and return to stand to demo functional strength.    Time 6    Period Weeks    Status New    Target Date 10/02/20                   Plan - 08/28/20 1135     Clinical Impression Statement Pt twisted her Rt knee while attempting to get off of recumbent bicycle.  She reported mild discomfort above patella in Rt knee with  SLS on trampoline and with split squats (limited depth).   Discomfort reduced with quad stretch and ice at end of session. Progressing well towards goals of therapy.    Rehab Potential Good    PT Frequency 2x / week    PT Duration 6 weeks    PT Treatment/Interventions ADLs/Self Care Home Management;Dry needling;Manual techniques;Gait training;Stair training;Vasopneumatic Device;Aquatic Therapy;Electrical Stimulation;Cryotherapy;Functional mobility training;Therapeutic activities;Therapeutic exercise;Balance training;Neuromuscular re-education;Patient/family education;Passive range of motion;Taping    PT Next Visit Plan Continue working on LE strengthening, STM/DN, flexibility, and gait training    PT Home Exercise Plan Access Code: [email or web address]    Consulted and Agree with Plan of Care Patient             Patient will benefit from skilled therapeutic intervention in order to improve the following deficits and impairments:  Pain, Hypomobility, Impaired flexibility, Decreased strength, Decreased range of motion, Difficulty walking, Increased fascial restricitons, Impaired tone, Decreased activity tolerance, Postural dysfunction, Increased edema  Visit Diagnosis: Acute pain of right knee  Muscle weakness (generalized)  Localized edema  Other abnormalities of gait and mobility     Problem List Patient Active Problem List   Diagnosis Date Noted   Obesity    IBS (irritable bowel syndrome)    Hypertension    Hyperlipidemia    H/O: GI bleed    Gastrointestinal ulcer    Gastro-esophageal reflux    Diverticulosis    Depression  Colon polyps    Anxiety    Anemia    Post-nasal drainage 02/10/2020   S/P CABG x 3 LIMA to LAD, SVG sequential to RCA and circumflex in May 2021    Unstable angina ([place]) 06/11/2019   Angina pectoris ([place]) 06/04/2019   Abnormal stress test 05/24/2019   Abnormal stress echocardiogram 05/24/2019   Radiculopathy 04/02/2018   Abnormal ECG 04/01/2018    Radiculitis of right cervical region 01/20/2018   Carpal tunnel syndrome, right 12/16/2017   Right hand pain 06/24/2017   Impingement syndrome of shoulder, right 06/24/2017   Dupuytren's contracture of right hand 06/24/2017   Bilateral carpal tunnel syndrome 05/23/2017   Depression, major, in partial remission ([place]) 07/14/2015   RLS (restless legs syndrome) 07/14/2015   Atherosclerosis of native coronary artery with angina pectoris ([place]) 05/10/2015   Musculoskeletal chest pain 05/04/2015   Precordial pain 05/04/2015   Generalized anxiety disorder 03/14/2015   Primary insomnia 03/14/2015   Type 2 diabetes mellitus with diabetic neuropathy, with long-term current use of insulin ([place]) 02/16/2010   Hyperlipidemia LDL goal <70 02/16/2010   Essential hypertension 02/16/2010   GERD 02/16/2010   Bilateral knee pain 02/16/2010    Kerin Perna, PTA 08/28/20 11:56 AM   [place] [address] [place], [name], [address] Phone: [phone]   Fax:  [phone]  Name: Tonya Chandler MRN: [id number] Date of Birth: March 23, 1960

## 2020-08-29 ENCOUNTER — Encounter: Payer: Self-pay | Admitting: Osteopathic Medicine

## 2020-08-29 NOTE — Telephone Encounter (Signed)
This encounter was created in error - please disregard.

## 2020-08-31 ENCOUNTER — Other Ambulatory Visit: Payer: Self-pay

## 2020-08-31 ENCOUNTER — Ambulatory Visit (INDEPENDENT_AMBULATORY_CARE_PROVIDER_SITE_OTHER): Payer: BC Managed Care – PPO | Admitting: Physical Therapy

## 2020-08-31 DIAGNOSIS — R600 Localized edema: Secondary | ICD-10-CM

## 2020-08-31 DIAGNOSIS — M25561 Pain in right knee: Secondary | ICD-10-CM | POA: Diagnosis not present

## 2020-08-31 DIAGNOSIS — R2689 Other abnormalities of gait and mobility: Secondary | ICD-10-CM

## 2020-08-31 DIAGNOSIS — M6281 Muscle weakness (generalized): Secondary | ICD-10-CM
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
## 2020-09-04 ENCOUNTER — Ambulatory Visit (INDEPENDENT_AMBULATORY_CARE_PROVIDER_SITE_OTHER): Payer: BC Managed Care – PPO | Admitting: Osteopathic Medicine

## 2020-09-04 ENCOUNTER — Encounter: Payer: Self-pay | Admitting: Osteopathic Medicine

## 2020-09-04 ENCOUNTER — Other Ambulatory Visit: Payer: Self-pay

## 2020-09-04 ENCOUNTER — Encounter: Payer: BC Managed Care – PPO | Admitting: Physical Therapy

## 2020-09-04 VITALS — BP 171/106 | HR 83 | Temp 98.1°F | Wt 169.0 lb

## 2020-09-04 DIAGNOSIS — E1165 Type 2 diabetes mellitus with hyperglycemia: Secondary | ICD-10-CM | POA: Diagnosis not present

## 2020-09-04 DIAGNOSIS — F4321 Adjustment disorder with depressed mood: Secondary | ICD-10-CM | POA: Diagnosis not present

## 2020-09-04 DIAGNOSIS — IMO0002 Reserved for concepts with insufficient information to code with codable children: Secondary | ICD-10-CM

## 2020-09-04 DIAGNOSIS — N898 Other specified noninflammatory disorders of vagina: Secondary | ICD-10-CM

## 2020-09-04 DIAGNOSIS — I10 Essential (primary) hypertension: Secondary | ICD-10-CM | POA: Diagnosis not present

## 2020-09-04 DIAGNOSIS — E1140 Type 2 diabetes mellitus with diabetic neuropathy, unspecified: Secondary | ICD-10-CM | POA: Diagnosis not present

## 2020-09-04 LAB — POCT GLYCOSYLATED HEMOGLOBIN (HGB A1C): Hemoglobin A1C: 8.6 % — AB (ref 4.0–5.6)

## 2020-09-04 MED ORDER — SULFAMETHOXAZOLE-TRIMETHOPRIM 800-160 MG PO TABS: 1.0000 | ORAL_TABLET | Freq: Two times a day (BID) | ORAL | 0 refills | Status: DC

## 2020-09-04 MED ORDER — TRULICITY 3 MG/0.5ML ~~LOC~~ SOAJ: 3.0000 mg | SUBCUTANEOUS | 1 refills | Status: DC

## 2020-09-04 MED ORDER — LIDOCAINE 5 % EX OINT: 1.0000 "application " | TOPICAL_OINTMENT | CUTANEOUS | 1 refills | Status: AC | PRN

## 2020-09-04 MED ORDER — HUMALOG KWIKPEN 200 UNIT/ML ~~LOC~~ SOPN: PEN_INJECTOR | SUBCUTANEOUS | 3 refills | Status: DC

## 2020-09-04 MED ORDER — INSULIN GLARGINE-YFGN 100 UNIT/ML ~~LOC~~ SOPN: 60.0000 [IU] | PEN_INJECTOR | Freq: Every day | SUBCUTANEOUS | 99 refills | Status: AC

## 2020-09-04 NOTE — Progress Notes (Signed)
Tonya Chandler is a 60 y.o. female who presents to  [facility]  today, 09/04/20, seeking care for the following:  DM2 follow-up patient reports there is probably some room for improvement in diet/exercise but she has been under a significant amount of stress lately, see below BP above goal no chest pain, pressure, shortness of breath.  No headache, no vision change. Concern for vaginal cysts.  Pain/swelling in vaginal area, burning with urination.  On exam, has some ulcerated areas to labia minora and vaginal introitus particularly right around the urethra.  No abscess/cyst.  Reports some concern for frequent UTI/yeast infection Grief reaction/depression, daughter recently died of presumed overdose.  Patient is having a difficult time with this but she reports she has good family support, does not want to adjust medications or seek counseling but she is looking into support groups     ASSESSMENT & PLAN with other pertinent findings:  The primary encounter diagnosis was Type 2 diabetes, uncontrolled, with neuropathy ([place]). Diagnoses of Vaginal lesion, Primary hypertension, and Grief reaction were also pertinent to this visit.   1. Type 2 diabetes, uncontrolled, with neuropathy (HCC) Titrate insulin to goal fasting blood glucose, understandable if might not be able to be too diligent about diet/exercise with grief reaction/everything else going on  2. Vaginal lesion Exam concerning for healing ulcerations/other irritation.  Viral culture obtained, lidocaine sent to help with pain.  Concern for potential HSV versus other.  Will discontinue Wilder Glade as this may be contributing to infection  3. Primary hypertension Blood pressure not at goal, patient will monitor at home, would have low threshold for adjusting medications  4. Grief reaction Patient declined referral for medication adjustment/behavioral health counseling but she is interested  in seeing some support groups.  I recommended reaching out to Narcotics Anonymous family support groups   Patient Instructions  Plan:  Diabetes: STOP Fargixa for now Continue insulin and other Rx as-is  Rx sent for antibiotics to treat vaginal infection Await culture, may need to add antivirals  Rx also sent for numbing medicine    Orders Placed This Encounter  Procedures   Virus culture   POCT HgB A1C    Meds ordered this encounter  Medications   insulin glargine-yfgn (SEMGLEE, YFGN,) 100 UNIT/ML Pen    Sig: Inject 60-100 Units into the skin daily. Target FBG 120    Dispense:  15 mL    Refill:  99   insulin lispro (HUMALOG KWIKPEN) 200 UNIT/ML KwikPen    Sig: Inject 10 units subcutaneously as directed prior to lunch and dinner    Dispense:  15 mL    Refill:  3   Dulaglutide (TRULICITY) 3 0000000 SOPN    Sig: Inject 3 mg as directed once a week.    Dispense:  6 mL    Refill:  1   lidocaine (XYLOCAINE) 5 % ointment    Sig: Apply 1 application topically every 3 (three) hours as needed.    Dispense:  35 g    Refill:  1   sulfamethoxazole-trimethoprim (BACTRIM DS) 800-160 MG tablet    Sig: Take 1 tablet by mouth 2 (two) times daily.    Dispense:  14 tablet    Refill:  0     See below for relevant physical exam findings  See below for recent lab and imaging results reviewed  Medications, allergies, PMH, PSH, SocH, FamH reviewed below    Follow-up instructions: Return for recheck BP  w/ Dr Sheppard Coil in 1-2 weeks .                                        Exam:  BP (!) 171/106 (BP Location: Left Arm, Patient Position: Sitting, Cuff Size: Normal)   Pulse 83   Temp 98.1 F (36.7 C) (Oral)   Wt 169 lb (76.7 kg)   BMI 33.01 kg/m  Constitutional: VS see above. General Appearance: alert, well-developed, well-nourished, NAD Neck: No masses, trachea midline.  Respiratory: Normal respiratory effort. no wheeze, no rhonchi, no  rales Cardiovascular: S1/S2 normal, no murmur, no rub/gallop auscultated. RRR.  Musculoskeletal: Gait normal. Symmetric and independent movement of all extremities Abdominal: non-tender, non-distended, no appreciable organomegaly, neg Murphy's, BS WNLx4 Neurological: Normal balance/coordination. No tremor. Skin: warm, dry, intact.  Psychiatric: Normal judgment/insight. Normal mood and affect. Oriented x3.  GYN: see above   Current Meds  Medication Sig   acetaminophen (TYLENOL) 325 MG tablet Take 2 tablets (650 mg total) by mouth every 6 (six) hours as needed for mild pain.   aspirin EC 81 MG tablet Take 81 mg by mouth daily. Swallow whole.   atorvastatin (LIPITOR) 40 MG tablet TAKE 1 TABLET(40 MG) BY MOUTH DAILY (Patient taking differently: Take 40 mg by mouth daily.)   celecoxib (CELEBREX) 100 MG capsule TAKE 1 CAPSULE BY MOUTH TWICE DAILY AS NEEDED FOR ARTHRITIS   Cyanocobalamin (VITAMIN B-12) 2500 MCG SUBL Place 2,500 mcg under the tongue daily.   cyclobenzaprine (FLEXERIL) 10 MG tablet TAKE 1 TABLET(10 MG) BY MOUTH AT BEDTIME (Patient taking differently: Take 10 mg by mouth at bedtime.)   DULoxetine (CYMBALTA) 60 MG capsule TAKE 1 CAPSULE(60 MG) BY MOUTH DAILY (Patient taking differently: Take 60 mg by mouth daily.)   estradiol (ESTRACE) 1 MG tablet Take 1 tablet (1 mg total) by mouth daily.   Famotidine 20 MG CHEW Chew 20 mg by mouth daily as needed (reflux).    gabapentin (NEURONTIN) 800 MG tablet TAKE 1 TABLET BY MOUTH IN THE MORNING, 1 TABLET AT MIDDAY AND 2 TABLETS AT BEDTIME   lidocaine (XYLOCAINE) 5 % ointment Apply 1 application topically every 3 (three) hours as needed.   lisinopril (ZESTRIL) 5 MG tablet Take 1 tablet (5 mg total) by mouth daily.   loteprednol (LOTEMAX) 0.5 % ophthalmic suspension Place 1 drop into the right eye daily as needed (dry eye).    magnesium oxide (MAG-OX) 400 MG tablet Take 800 mg by mouth at bedtime.    methocarbamol (ROBAXIN) 500 MG tablet Take 1  tablet (500 mg total) by mouth 2 (two) times daily.   metoprolol tartrate (LOPRESSOR) 25 MG tablet TAKE 1 TABLET(25 MG) BY MOUTH TWICE DAILY (Patient taking differently: Take 25 mg by mouth 2 (two) times daily.)   mirtazapine (REMERON) 45 MG tablet TAKE 1 TABLET(45 MG) BY MOUTH AT BEDTIME   nitroGLYCERIN (NITROSTAT) 0.4 MG SL tablet Place 1 tablet (0.4 mg total) under the tongue every 5 (five) minutes as needed for chest pain.   omeprazole (PRILOSEC) 40 MG capsule Take 40 mg by mouth daily.   rOPINIRole (REQUIP) 3 MG tablet Take 1 tablet (3 mg total) by mouth at bedtime.   sulfamethoxazole-trimethoprim (BACTRIM DS) 800-160 MG tablet Take 1 tablet by mouth 2 (two) times daily.   [DISCONTINUED] dapagliflozin propanediol (FARXIGA) 10 MG TABS tablet Take 1 tablet (10 mg total) by mouth daily.   [DISCONTINUED]  Dulaglutide (TRULICITY) 3 0000000 SOPN Inject 3 mg as directed once a week.   [DISCONTINUED] Insulin Glargine-yfgn (SEMGLEE, YFGN,) 100 UNIT/ML SOPN Inject 60-100 Units into the skin daily. Target FBG 120   [DISCONTINUED] insulin lispro (HUMALOG KWIKPEN) 200 UNIT/ML KwikPen Inject 5 units as directed prior to lunch and dinner (Patient taking differently: Inject 10 Units into the skin See admin instructions. Inject 10 units as directed prior to lunch and dinner)    Allergies  Allergen Reactions   Adhesive [Tape] Hives    Welts--PLEASE USE HYPO ALLERGIC TAPE   Ampicillin Diarrhea    Did it involve swelling of the face/tongue/throat, SOB, or low BP? No Did it involve sudden or severe rash/hives, skin peeling, or any reaction on the inside of your mouth or nose? No Did you need to seek medical attention at a hospital or doctor's office? No When did it last happen? 30-40 years ago      If all above answers are "NO", may proceed with cephalosporin use.    Cefprozil Other (See Comments)    Acid reflux/indigestion issues   Meloxicam Other (See Comments)    Acid reflux/indigestion issues     Patient Active Problem List   Diagnosis Date Noted   Obesity    IBS (irritable bowel syndrome)    Hypertension    Hyperlipidemia    H/O: GI bleed    Gastrointestinal ulcer    Gastro-esophageal reflux    Diverticulosis    Depression    Colon polyps    Anxiety    Anemia    Post-nasal drainage 02/10/2020   S/P CABG x 3 LIMA to LAD, SVG sequential to RCA and circumflex in May 2021    Unstable angina ([place]) 06/11/2019   Angina pectoris ([place]) 06/04/2019   Abnormal stress test 05/24/2019   Abnormal stress echocardiogram 05/24/2019   Radiculopathy 04/02/2018   Abnormal ECG 04/01/2018   Radiculitis of right cervical region 01/20/2018   Carpal tunnel syndrome, right 12/16/2017   Right hand pain 06/24/2017   Impingement syndrome of shoulder, right 06/24/2017   Dupuytren's contracture of right hand 06/24/2017   Bilateral carpal tunnel syndrome 05/23/2017   Depression, major, in partial remission ([place]) 07/14/2015   RLS (restless legs syndrome) 07/14/2015   Atherosclerosis of native coronary artery with angina pectoris ([place]) 05/10/2015   Musculoskeletal chest pain 05/04/2015   Precordial pain 05/04/2015   Generalized anxiety disorder 03/14/2015   Primary insomnia 03/14/2015   Type 2 diabetes mellitus with diabetic neuropathy, with long-term current use of insulin ([place]) 02/16/2010   Hyperlipidemia LDL goal <70 02/16/2010   Essential hypertension 02/16/2010   GERD 02/16/2010   Bilateral knee pain 02/16/2010    Family History  Problem Relation Age of Onset   Diabetes Mother    Dementia Mother    High blood pressure Mother    Heart attack Mother    Colon cancer Mother    Ovarian cancer Mother    Heart attack Father    Diabetes Father    Heart attack Brother    High blood pressure Brother    Diabetes Brother    Epilepsy Brother    Colon polyps Daughter 22   Esophageal cancer Neg Hx    Rectal cancer Neg Hx    Stomach cancer Neg Hx    Heart disease Neg Hx     Social  History   Tobacco Use  Smoking Status Never  Smokeless Tobacco Never    Past Surgical History:  Procedure Laterality Date  ABDOMINAL HYSTERECTOMY     ANTERIOR CERVICAL DECOMP/DISCECTOMY FUSION N/A 04/02/2018   Procedure: ANTERIOR CERVICAL DECOMPRESSION FUSION, CERVICAL FOUR-FIVE, CERVICAL FIVE-SIX, WITH INSTRUMENTATION AND ALLOGRAFT;  Surgeon: Phylliss Bob, MD;  Location: [place] [facility];  Service: Orthopedics;  Laterality: N/A;   COLONOSCOPY WITH ESOPHAGOGASTRODUODENOSCOPY (EGD)  05/04/2009   CORONARY ARTERY BYPASS GRAFT N/A 06/14/2019   Procedure: CORONARY ARTERY BYPASS GRAFTING (CABG) using LIMA to LAD; Endoscopic Right Greater Saphenous Vein Harvest: SVG to Circ (distal); SVG to RCA (distal).;  Surgeon: [name], MD;  Location: [facility];  Service: Open Heart Surgery;  Laterality: N/A;   ENDOVEIN HARVEST OF GREATER SAPHENOUS VEIN Right 06/14/2019   Procedure: [place] Of Greater Saphenous Vein;  Surgeon: [name], MD;  Location: [place];  Service: Open Heart Surgery;  Laterality: Right;   LEFT HEART CATH AND CORONARY ANGIOGRAPHY N/A 06/11/2019   Procedure: LEFT HEART CATH AND CORONARY ANGIOGRAPHY;  Surgeon: Wellington Hampshire, MD;  Location: [employer] [facility];  Service: Cardiovascular;  Laterality: N/A;   PIP JOINT FUSION Bilateral    pinky   TEE WITHOUT CARDIOVERSION N/A 06/14/2019   Procedure: TRANSESOPHAGEAL ECHOCARDIOGRAM (TEE);  Surgeon: [name], MD;  Location: [facility];  Service: Open Heart Surgery;  Laterality: N/A;    Immunization History  Administered Date(s) Administered   Influenza,inj,Quad PF,6+ Mos 11/05/2014, 10/12/2017, 10/12/2017, 09/30/2018, 11/30/2019   Influenza-Unspecified 10/22/2014, 10/13/2015   PFIZER(Purple Top)SARS-COV-2 Vaccination 03/26/2019, 04/20/2019, 11/18/2019   Tdap 07/22/2011   Zoster Recombinat (Shingrix) 09/30/2018, 07/03/2020    Recent Results (from the past 2160 hour(s))  Novel Coronavirus, NAA ([facility])     Status: None    Collection Time: 06/14/20  9:16 AM   Specimen: Nasopharyngeal(NP) swabs in vial transport medium   Nasopharynge  Testing  Result Value Ref Range   SARS-CoV-2, NAA Not Detected Not Detected    Comment: This nucleic acid amplification test was developed and its performance characteristics determined by [employer]. Nucleic acid amplification tests include RT-PCR and TMA. This test has not been FDA cleared or approved. This test has been authorized by FDA under an Emergency Use Authorization (EUA). This test is only authorized for the duration of time the declaration that circumstances exist justifying the authorization of the emergency use of in vitro diagnostic tests for detection of SARS-CoV-2 virus and/or diagnosis of COVID-19 infection under section 564(b)(1) of the Act, 21 U.S.C. [id number]) (1), unless the authorization is terminated or revoked sooner. When diagnostic testing is negative, the possibility of a false negative result should be considered in the context of a patient's recent exposures and the presence of clinical signs and symptoms consistent with COVID-19. An individual without symptoms of COVID-19 and who is not shedding SARS-CoV-2 virus wo uld expect to have a negative (not detected) result in this assay.   SARS-COV-2, NAA 2 DAY TAT     Status: None   Collection Time: 06/14/20  9:16 AM   Nasopharynge  Testing  Result Value Ref Range   SARS-CoV-2, NAA 2 DAY TAT Performed   Lipid panel     Status: Abnormal   Collection Time: 06/15/20  3:18 PM  Result Value Ref Range   Cholesterol, Total 160 100 - 199 mg/dL   Triglycerides 265 (H) 0 - 149 mg/dL   HDL 56 >39 mg/dL   VLDL Cholesterol Cal 42 (H) 5 - 40 mg/dL   LDL Chol Calc (NIH) 62 0 - 99 mg/dL   Chol/HDL Ratio 2.9 0.0 - 4.4 ratio  Comment:                                   T. Chol/HDL Ratio                                             Men  Women                               1/2 Avg.Risk  3.4     3.3                                   Avg.Risk  5.0    4.4                                2X Avg.Risk  9.6    7.1                                3X Avg.Risk 23.4   11.0   Hemoglobin A1c     Status: Abnormal   Collection Time: 06/15/20  3:18 PM  Result Value Ref Range   Hgb A1c MFr Bld 9.0 (H) 4.8 - 5.6 %    Comment:          Prediabetes: 5.7 - 6.4          Diabetes: >6.4          Glycemic control for adults with diabetes: <7.0    Est. average glucose Bld gHb Est-mCnc 212 mg/dL  POCT HgB A1C     Status: Abnormal   Collection Time: 09/04/20 10:48 AM  Result Value Ref Range   Hemoglobin A1C 8.6 (A) 4.0 - 5.6 %   HbA1c POC (<> result, manual entry)     HbA1c, POC (prediabetic range)     HbA1c, POC (controlled diabetic range)      No results found.     All questions at time of visit were answered - patient instructed to contact office with any additional concerns or updates. ER/RTC precautions were reviewed with the patient as applicable.   Please note: manual typing as well as voice recognition software may have been used to produce this document - typos may escape review. Please contact Dr. Sheppard Coil for any needed clarifications.

## 2020-09-04 NOTE — Patient Instructions (Addendum)
Plan:  Diabetes: STOP Fargixa for now Continue insulin and other Rx as-is  Rx sent for antibiotics to treat vaginal infection Await culture, may need to add antivirals  Rx also sent for numbing medicine

## 2020-09-05 ENCOUNTER — Encounter: Payer: Self-pay | Admitting: Osteopathic Medicine

## 2020-09-05 DIAGNOSIS — S83241A Other tear of medial meniscus, current injury, right knee, initial encounter: Secondary | ICD-10-CM | POA: Diagnosis not present

## 2020-09-06 DIAGNOSIS — N898 Other specified noninflammatory disorders of vagina: Secondary | ICD-10-CM | POA: Diagnosis not present

## 2020-09-06 NOTE — Telephone Encounter (Signed)
Medication: Trulicity 3 [id number] mL Prior authorization determination received Medication has been approved  Patient aware: Yes, via message through [place] aware: Yes Provider aware via this encounter

## 2020-09-07 ENCOUNTER — Ambulatory Visit (INDEPENDENT_AMBULATORY_CARE_PROVIDER_SITE_OTHER): Payer: BC Managed Care – PPO | Admitting: Physical Therapy

## 2020-09-07 ENCOUNTER — Other Ambulatory Visit: Payer: Self-pay

## 2020-09-07 ENCOUNTER — Encounter: Payer: Self-pay | Admitting: Physical Therapy

## 2020-09-07 DIAGNOSIS — M6281 Muscle weakness (generalized): Secondary | ICD-10-CM

## 2020-09-07 DIAGNOSIS — M25561 Pain in right knee: Secondary | ICD-10-CM

## 2020-09-07 DIAGNOSIS — R2689 Other abnormalities of gait and mobility: Secondary | ICD-10-CM

## 2020-09-07 DIAGNOSIS — R600 Localized edema: Secondary | ICD-10-CM

## 2020-09-07 NOTE — Therapy (Signed)
[place], [name], [address] Phone: [phone]   Fax:  [phone]  Physical Therapy Treatment  Patient Details  Name: Tonya Chandler MRN: [id number] Date of Birth: 1960/08/22 Referring Provider (PT): Frederik Pear, MD   Encounter Date: 09/07/2020   PT End of Session - 09/07/20 1105     Visit Number 16    Number of Visits 24    Date for PT Re-Evaluation 10/02/20    Authorization Type [employer] 60 visits/year combined    Authorization - Visit Number [id number]    Authorization - Number of Visits 60    PT Start Time 1101    PT Stop Time 1150    PT Time Calculation (min) 49 min    Activity Tolerance Patient limited by pain    Behavior During Therapy [facility] for tasks assessed/performed             Past Medical History:  Diagnosis Date   Abnormal ECG 04/01/2018   Abnormal stress echocardiogram 05/24/2019   Formatting of this note might be different from the original. Added automatically from request for surgery [id number]   Abnormal stress test 05/24/2019   Formatting of this note might be different from the original. Added automatically from request for surgery [id number]   Anemia    Angina pectoris ([place]) 06/04/2019   Anxiety    Atherosclerosis of native coronary artery with angina pectoris ([place]) 05/10/2015   Formatting of this note might be different from the original. 30% lesion-Chiu   Bilateral carpal tunnel syndrome 05/23/2017   Bilateral knee pain 02/16/2010   Qualifier: Diagnosis of  By: Koleen Nimrod MD, Jeffrey     Carpal tunnel syndrome, right 12/16/2017   Colon polyps    Depression    DEPRESSION 02/16/2010   Qualifier: Diagnosis of  By: Koleen Nimrod MD, Dellis Filbert     Depression, major, in partial remission ([place]) 07/14/2015   Diabetes mellitus    Diverticulosis    Dupuytren's contracture of right hand 06/24/2017   Essential hypertension 02/16/2010   Qualifier: Diagnosis of  By: Koleen Nimrod MD, Dellis Filbert     Gastro-esophageal  reflux    Gastrointestinal ulcer    Generalized anxiety disorder 03/14/2015   GERD 02/16/2010   Qualifier: Diagnosis of  By: Koleen Nimrod MD, Dellis Filbert     H/O: GI bleed    Said it was due to taking metformin   Hyperlipidemia    Hyperlipidemia LDL goal <70 02/16/2010   Qualifier: Diagnosis of  By: Koleen Nimrod MD, Dellis Filbert     Hypertension    IBS (irritable bowel syndrome)    Impingement syndrome of shoulder, right 06/24/2017   Midline low back pain with left-sided sciatica 12/29/2013   Musculoskeletal chest pain 05/04/2015   Obesity    Post-nasal drainage 02/10/2020   Last Assessment & Plan:  Formatting of this note might be different from the original. Concern over postnasal drainage. Throat awareness symptoms ever since she was packed in the right nostril last summer.  Was treated with antibiotics with some improvement.  Causes intermittent hoarseness and loss of voice.  Non-smoker. EXAM by anterior rhinoscopy shows no obvious polyps or purulence or obstructi   Precordial pain 05/04/2015   Primary insomnia 03/14/2015   Radiculitis of right cervical region 01/20/2018   Radiculopathy 04/02/2018   Right hand pain 06/24/2017   RLS (restless legs syndrome) 07/14/2015   Type 2 diabetes mellitus with diabetic neuropathy, with long-term current use of insulin ([place]) 02/16/2010   Qualifier: Diagnosis of  By:  Koleen Nimrod MD, Dellis Filbert     Unstable angina [facility]) 06/11/2019    Past Surgical History:  Procedure Laterality Date   ABDOMINAL HYSTERECTOMY     ANTERIOR CERVICAL DECOMP/DISCECTOMY FUSION N/A 04/02/2018   Procedure: ANTERIOR CERVICAL DECOMPRESSION FUSION, CERVICAL FOUR-FIVE, CERVICAL FIVE-SIX, WITH INSTRUMENTATION AND ALLOGRAFT;  Surgeon: Phylliss Bob, MD;  Location: [place];  Service: Orthopedics;  Laterality: N/A;   COLONOSCOPY WITH ESOPHAGOGASTRODUODENOSCOPY (EGD)  05/04/2009   CORONARY ARTERY BYPASS GRAFT N/A 06/14/2019   Procedure: CORONARY ARTERY BYPASS GRAFTING (CABG) using LIMA to LAD; Endoscopic Right  Greater Saphenous Vein Harvest: SVG to Circ (distal); SVG to RCA (distal).;  Surgeon: [name], MD;  Location: [place];  Service: Open Heart Surgery;  Laterality: N/A;   ENDOVEIN HARVEST OF GREATER SAPHENOUS VEIN Right 06/14/2019   Procedure: [place] Of Greater Saphenous Vein;  Surgeon: [name], MD;  Location: [place];  Service: Open Heart Surgery;  Laterality: Right;   LEFT HEART CATH AND CORONARY ANGIOGRAPHY N/A 06/11/2019   Procedure: LEFT HEART CATH AND CORONARY ANGIOGRAPHY;  Surgeon: Wellington Hampshire, MD;  Location: [place] CV [facility];  Service: Cardiovascular;  Laterality: N/A;   PIP JOINT FUSION Bilateral    pinky   TEE WITHOUT CARDIOVERSION N/A 06/14/2019   Procedure: TRANSESOPHAGEAL ECHOCARDIOGRAM (TEE);  Surgeon: [name], MD;  Location: [place];  Service: Open Heart Surgery;  Laterality: N/A;    There were no vitals filed for this visit.   Subjective Assessment - 09/07/20 1105     Subjective "I am not  doing well". Pt reports she tweaked her Rt knee while side stepping without brace on, on Monday.  Pain has been increased since then.  She saw her MD 2 days ago. Recieved a cortisone injection. She returns 10/03/20, and if things are going well "I can stay the course, but if not, I'll schedule the surgery".    Currently in Pain? Yes    Pain Score 8     Pain Location Knee    Pain Orientation Right;Posterior    Aggravating Factors  pivoting in WB    Pain Relieving Factors ice, rest, brace                [facility] PT Assessment - 09/07/20 0001       Assessment   Medical Diagnosis strengthen quad, MCL    Referring Provider (PT) Frederik Pear, MD    Onset Date/Surgical Date 06/27/20    Hand Dominance Left    Next MD Visit 10/03/20      AROM   Right Knee Flexion 100      Flexibility   Quadriceps 80              [facility] Adult PT Treatment/Exercise - 09/07/20 0001       Knee/Hip Exercises: Stretches   Passive Hamstring Stretch Right;5  reps;20 seconds    Quad Stretch Right;5 reps;20 seconds    Gastroc Stretch Both;Left;2 reps;20 seconds      Knee/Hip Exercises: Aerobic   Nustep L4: 5.5 min for warm up.    Other Aerobic single laps around the gym in between exercises (without brace), with cues for even weight shift     Knee/Hip Exercises: Standing   Heel Raises Both;1 set;5 reps   with UE support   Forward Step Up Right;2 sets;5 reps;10 reps;Hand Hold: 2;Step Height: 4"      Knee/Hip Exercises: Supine   Quad Sets Strengthening;Right;1 set;5 reps   5 sec hold, towel  under knee   Quad Sets Limitations painful    Bridges 1 set;10 reps   5 sec hold     Vasopneumatic   Number Minutes Vasopneumatic  14 minutes    Vasopnuematic Location  Knee   Rt   Vasopneumatic Pressure Low    Vasopneumatic Temperature  34      Kinesiotix   Facilitate Muscle  I strip of sensitive skin Rock tape applied to Rt adductor/medially and distal/medial to knee with 20% stretch - to decrease edema, increase proprioception.                         PT Long Term Goals - 08/21/20 1225       PT LONG TERM GOAL #1   Title The patient will be indep with HEP progression.    Time 6    Period Weeks    Status Revised    Target Date 10/02/20      PT LONG TERM GOAL #2   Title The patient will negotiate steps with reciprocal pattern and one handrail.    Time 6    Period Weeks    Status New    Target Date 10/02/20      PT LONG TERM GOAL #3   Title The patient will improve functional status score from 45% up to 57%.    Baseline measured on 08/03/20 improving from 29% up to 45%    Time 6    Period Weeks    Status Revised    Target Date 10/02/20      PT LONG TERM GOAL #4   Title The patient will return to full community ambulation without an assistive device.    Time 6    Period Weeks    Status New    Target Date 10/02/20      PT LONG TERM GOAL #5   Title The patient will demo a squat to pick up a pen from the floor and  return to stand to demo functional strength.    Time 6    Period Weeks    Status New    Target Date 10/02/20                   Plan - 09/07/20 1119     Clinical Impression Statement Flare up of symptoms in Rt knee since returning to work full time and wearing brace less during day.  Rt knee ROM decreased as well as flexibility. Limited tolerance for exercises.  Pt reported less pain with 4" steps (ascend/descend) after application of tape. Encouraged pt to utilize brace and stretch daily.  No new goals met this session.    Rehab Potential Good    PT Frequency 2x / week    PT Duration 6 weeks    PT Treatment/Interventions ADLs/Self Care Home Management;Dry needling;Manual techniques;Gait training;Stair training;Vasopneumatic Device;Aquatic Therapy;Electrical Stimulation;Cryotherapy;Functional mobility training;Therapeutic activities;Therapeutic exercise;Balance training;Neuromuscular re-education;Patient/family education;Passive range of motion;Taping    PT Next Visit Plan Continue working on LE strengthening, STM/DN, flexibility, and gait training.    PT Home Exercise Plan Access Code: [email or web address]    Consulted and Agree with Plan of Care Patient             Patient will benefit from skilled therapeutic intervention in order to improve the following deficits and impairments:  Pain, Hypomobility, Impaired flexibility, Decreased strength, Decreased range of motion, Difficulty walking, Increased fascial restricitons, Impaired tone, Decreased activity tolerance, Postural dysfunction, Increased edema  Visit  Diagnosis: Acute pain of right knee  Muscle weakness (generalized)  Localized edema  Other abnormalities of gait and mobility     Problem List Patient Active Problem List   Diagnosis Date Noted   Obesity    IBS (irritable bowel syndrome)    Hypertension    Hyperlipidemia    H/O: GI bleed    Gastrointestinal ulcer    Gastro-esophageal reflux    Diverticulosis     Depression    Colon polyps    Anxiety    Anemia    Post-nasal drainage 02/10/2020   S/P CABG x 3 LIMA to LAD, SVG sequential to RCA and circumflex in May 2021    Unstable angina ([place]) 06/11/2019   Angina pectoris ([place]) 06/04/2019   Abnormal stress test 05/24/2019   Abnormal stress echocardiogram 05/24/2019   Radiculopathy 04/02/2018   Abnormal ECG 04/01/2018   Radiculitis of right cervical region 01/20/2018   Carpal tunnel syndrome, right 12/16/2017   Right hand pain 06/24/2017   Impingement syndrome of shoulder, right 06/24/2017   Dupuytren's contracture of right hand 06/24/2017   Bilateral carpal tunnel syndrome 05/23/2017   Depression, major, in partial remission ([place]) 07/14/2015   RLS (restless legs syndrome) 07/14/2015   Atherosclerosis of native coronary artery with angina pectoris ([place]) 05/10/2015   Musculoskeletal chest pain 05/04/2015   Precordial pain 05/04/2015   Generalized anxiety disorder 03/14/2015   Primary insomnia 03/14/2015   Type 2 diabetes mellitus with diabetic neuropathy, with long-term current use of insulin ([place]) 02/16/2010   Hyperlipidemia LDL goal <70 02/16/2010   Essential hypertension 02/16/2010   GERD 02/16/2010   Bilateral knee pain 02/16/2010   Kerin Perna, PTA 09/07/20 11:52 AM   [place], [name], [address] Phone: [phone]   Fax:  [phone]  Name: Tonya Chandler MRN: [id number] Date of Birth: 1960/05/11

## 2020-09-08 ENCOUNTER — Other Ambulatory Visit: Payer: Self-pay | Admitting: Osteopathic Medicine

## 2020-09-09 LAB — VIRUS CULTURE

## 2020-09-11 ENCOUNTER — Encounter: Payer: Self-pay | Admitting: Physical Therapy

## 2020-09-11 ENCOUNTER — Ambulatory Visit (INDEPENDENT_AMBULATORY_CARE_PROVIDER_SITE_OTHER): Payer: BC Managed Care – PPO | Admitting: Physical Therapy

## 2020-09-11 ENCOUNTER — Encounter: Payer: Self-pay | Admitting: Osteopathic Medicine

## 2020-09-11 ENCOUNTER — Other Ambulatory Visit: Payer: Self-pay

## 2020-09-11 DIAGNOSIS — B009 Herpesviral infection, unspecified: Secondary | ICD-10-CM | POA: Insufficient documentation

## 2020-09-11 DIAGNOSIS — M6281 Muscle weakness (generalized): Secondary | ICD-10-CM

## 2020-09-11 DIAGNOSIS — R600 Localized edema: Secondary | ICD-10-CM

## 2020-09-11 DIAGNOSIS — M25561 Pain in right knee: Secondary | ICD-10-CM | POA: Diagnosis not present

## 2020-09-11 DIAGNOSIS — R2689 Other abnormalities of gait and mobility: Secondary | ICD-10-CM

## 2020-09-11 NOTE — Therapy (Addendum)
[place], [name], [address] Phone: [phone]   Fax:  [phone]  Physical Therapy Treatment and Discharge Summary  Patient Details  Name: Tonya Chandler MRN: [id number] Date of Birth: [birth year] Referring Provider (PT): Frederik Pear, MD   Encounter Date: 09/11/2020   PT End of Session - 09/11/20 1156     Visit Number 17    Number of Visits 24    Date for PT Re-Evaluation 10/02/20    Authorization Type BCBS 60 visits/year combined    Authorization - Visit Number 75    Authorization - Number of Visits 41    PT Start Time 1015    PT Stop Time 1055    PT Time Calculation (min) 40 min    Activity Tolerance Patient tolerated treatment well    Behavior During Therapy [facility] for tasks assessed/performed             Past Medical History:  Diagnosis Date   Abnormal ECG 04/01/2018   Abnormal stress echocardiogram 05/24/2019   Formatting of this note might be different from the original. Added automatically from request for surgery [id number]   Abnormal stress test 05/24/2019   Formatting of this note might be different from the original. Added automatically from request for surgery [id number]   Anemia    Angina pectoris ([place]) 06/04/2019   Anxiety    Atherosclerosis of native coronary artery with angina pectoris ([place]) 05/10/2015   Formatting of this note might be different from the original. 30% lesion-Chiu   Bilateral carpal tunnel syndrome 05/23/2017   Bilateral knee pain 02/16/2010   Qualifier: Diagnosis of  By: Koleen Nimrod MD, Jeffrey     Carpal tunnel syndrome, right 12/16/2017   Colon polyps    Depression    DEPRESSION 02/16/2010   Qualifier: Diagnosis of  By: Koleen Nimrod MD, Dellis Filbert     Depression, major, in partial remission ([place]) 07/14/2015   Diabetes mellitus    Diverticulosis    Dupuytren's contracture of right hand 06/24/2017   Essential hypertension 02/16/2010   Qualifier: Diagnosis of  By: Koleen Nimrod MD,  Dellis Filbert     Gastro-esophageal reflux    Gastrointestinal ulcer    Generalized anxiety disorder 03/14/2015   GERD 02/16/2010   Qualifier: Diagnosis of  By: Koleen Nimrod MD, Dellis Filbert     H/O: GI bleed    Said it was due to taking metformin   Hyperlipidemia    Hyperlipidemia LDL goal <70 02/16/2010   Qualifier: Diagnosis of  By: Koleen Nimrod MD, Dellis Filbert     Hypertension    IBS (irritable bowel syndrome)    Impingement syndrome of shoulder, right 06/24/2017   Midline low back pain with left-sided sciatica 12/29/2013   Musculoskeletal chest pain 05/04/2015   Obesity    Post-nasal drainage 02/10/2020   Last Assessment & Plan:  Formatting of this note might be different from the original. Concern over postnasal drainage. Throat awareness symptoms ever since she was packed in the right nostril last summer.  Was treated with antibiotics with some improvement.  Causes intermittent hoarseness and loss of voice.  Non-smoker. EXAM by anterior rhinoscopy shows no obvious polyps or purulence or obstructi   Precordial pain 05/04/2015   Primary insomnia 03/14/2015   Radiculitis of right cervical region 01/20/2018   Radiculopathy 04/02/2018   Right hand pain 06/24/2017   RLS (restless legs syndrome) 07/14/2015   Type 2 diabetes mellitus with diabetic neuropathy, with long-term current use of insulin ([place]) 02/16/2010   Qualifier: Diagnosis  of  By: Koleen Nimrod MD, Dellis Filbert     Unstable angina [facility]) 06/11/2019    Past Surgical History:  Procedure Laterality Date   ABDOMINAL HYSTERECTOMY     ANTERIOR CERVICAL DECOMP/DISCECTOMY FUSION N/A 04/02/2018   Procedure: ANTERIOR CERVICAL DECOMPRESSION FUSION, CERVICAL FOUR-FIVE, CERVICAL FIVE-SIX, WITH INSTRUMENTATION AND ALLOGRAFT;  Surgeon: Phylliss Bob, MD;  Location: [place];  Service: Orthopedics;  Laterality: N/A;   COLONOSCOPY WITH ESOPHAGOGASTRODUODENOSCOPY (EGD)  05/04/2009   CORONARY ARTERY BYPASS GRAFT N/A 06/14/2019   Procedure: CORONARY ARTERY BYPASS GRAFTING (CABG) using  LIMA to LAD; Endoscopic Right Greater Saphenous Vein Harvest: SVG to Circ (distal); SVG to RCA (distal).;  Surgeon: [name], MD;  Location: [place];  Service: Open Heart Surgery;  Laterality: N/A;   ENDOVEIN HARVEST OF GREATER SAPHENOUS VEIN Right 06/14/2019   Procedure: [place] Of Greater Saphenous Vein;  Surgeon: [name], MD;  Location: [place];  Service: Open Heart Surgery;  Laterality: Right;   LEFT HEART CATH AND CORONARY ANGIOGRAPHY N/A 06/11/2019   Procedure: LEFT HEART CATH AND CORONARY ANGIOGRAPHY;  Surgeon: Wellington Hampshire, MD;  Location: [place] [facility];  Service: Cardiovascular;  Laterality: N/A;   PIP JOINT FUSION Bilateral    pinky   TEE WITHOUT CARDIOVERSION N/A 06/14/2019   Procedure: TRANSESOPHAGEAL ECHOCARDIOGRAM (TEE);  Surgeon: [name], MD;  Location: [place];  Service: Open Heart Surgery;  Laterality: N/A;    There were no vitals filed for this visit.   Subjective Assessment - 09/11/20 1020     Subjective "I've been braceless", hasn't worn her brace since Friday.  No issues over the weekend.  The ktape on knee helped a great deal.  Would like to be retaped.  She states the slightly tweaked Rt knee when going down stairs this morning.    Patient Stated Goals Strengthen knee, no pain, full use of R leg    Currently in Pain? No/denies    Pain Score 0-No pain                [facility] PT Assessment - 09/11/20 0001       Assessment   Medical Diagnosis strengthen quad, MCL    Referring Provider (PT) Frederik Pear, MD    Onset Date/Surgical Date 06/27/20    Hand Dominance Left    Next MD Visit 10/03/20    Prior Therapy known to our clinic from prior PT for different condition      Flexibility   Quadriceps 105               [facility] Adult PT Treatment/Exercise - 09/11/20 0001       Knee/Hip Exercises: Stretches   Passive Hamstring Stretch Right;2 reps;30 seconds    Quad Stretch Right;3 reps;30 seconds    ITB Stretch Right;2  reps;30 seconds    Piriformis Stretch Right;1 rep;60 seconds    Gastroc Stretch Right;Left;2 reps;20 seconds   runners stretch   Other Knee/Hip Stretches Rt adductor stretch in standing x 3 reps of 10 sec      Knee/Hip Exercises: Aerobic   Recumbent Bike L2: 5 min for warm up.    Other Aerobic single laps around the gym in between exercises      Knee/Hip Exercises: Standing   Forward Step Up Right;2 sets;10 reps;Hand Hold: 2;Step Height: 4";Step Height: 6"    Step Down Left;2 sets;10 reps    SLS SLS on blue pad LLE/RLE x 20 sec    Other Standing Knee Exercises  squat (wide feet) with hip hinge with reach 6" step x 4 reps, then 2 reps to reach 4" step - no pain.   Single rep of split squat with Rt foot forward, to Lt knee on pad and returning to standing, with UE support on rail.      Modalities   Modalities --   pt declined.     Vasopneumatic   Number Minutes Vasopneumatic  --   declined     Kinesiotix   Facilitate Muscle  I strip of sensitive skin Rock tape applied to Rt adductor/medially and distal/medial to knee with 20% stretch - to decrease edema, increase proprioception.                         PT Long Term Goals - 09/11/20 1158       PT LONG TERM GOAL #1   Title The patient will be indep with HEP progression.    Time 6    Period Weeks    Status On-going      PT LONG TERM GOAL #2   Title The patient will negotiate steps with reciprocal pattern and one handrail.    Time 6    Period Weeks    Status Partially Met      PT LONG TERM GOAL #3   Title The patient will improve functional status score from 45% up to 57%.    Baseline measured on 08/03/20 improving from 29% up to 45%    Time 6    Period Weeks    Status On-going      PT LONG TERM GOAL #4   Title The patient will return to full community ambulation without an assistive device.    Time 6    Period Weeks    Status Achieved      PT LONG TERM GOAL #5   Title The patient will demo a squat to  pick up a pen from the floor and return to stand to demo functional strength.    Time 6    Period Weeks    Status On-going                   Plan - 09/11/20 1036     Clinical Impression Statement Pt completed session without brace without increase in pain (did not bring it with to session).  She reported some popping session with descending stairs, but no increase in pain.  Quad flexibility gradually improving; was 80 last week during flare up, and is now 105.  Improved exercise tolerance, especially to squats and stairs.    Rehab Potential Good    PT Frequency 2x / week    PT Duration 6 weeks    PT Treatment/Interventions ADLs/Self Care Home Management;Dry needling;Manual techniques;Gait training;Stair training;Vasopneumatic Device;Aquatic Therapy;Electrical Stimulation;Cryotherapy;Functional mobility training;Therapeutic activities;Therapeutic exercise;Balance training;Neuromuscular re-education;Patient/family education;Passive range of motion;Taping    PT Next Visit Plan Continue working on LE strengthening, STM/DN, flexibility, and gait training.    PT Home Exercise Plan Access Code: [email or web address]    Consulted and Agree with Plan of Care Patient             Patient will benefit from skilled therapeutic intervention in order to improve the following deficits and impairments:  Pain, Hypomobility, Impaired flexibility, Decreased strength, Decreased range of motion, Difficulty walking, Increased fascial restricitons, Impaired tone, Decreased activity tolerance, Postural dysfunction, Increased edema  Visit Diagnosis: Acute pain of right knee  Muscle weakness (generalized)  Localized edema  Other abnormalities of gait and mobility     Problem List Patient Active Problem List   Diagnosis Date Noted   Obesity    IBS (irritable bowel syndrome)    Hypertension    Hyperlipidemia    H/O: GI bleed    Gastrointestinal ulcer    Gastro-esophageal reflux    Diverticulosis     Depression    Colon polyps    Anxiety    Anemia    Post-nasal drainage 02/10/2020   S/P CABG x 3 LIMA to LAD, SVG sequential to RCA and circumflex in May 2021    Unstable angina ([place]) 06/11/2019   Angina pectoris ([place]) 06/04/2019   Abnormal stress test 05/24/2019   Abnormal stress echocardiogram 05/24/2019   Radiculopathy 04/02/2018   Abnormal ECG 04/01/2018   Radiculitis of right cervical region 01/20/2018   Carpal tunnel syndrome, right 12/16/2017   Right hand pain 06/24/2017   Impingement syndrome of shoulder, right 06/24/2017   Dupuytren's contracture of right hand 06/24/2017   Bilateral carpal tunnel syndrome 05/23/2017   Depression, major, in partial remission ([place]) 07/14/2015   RLS (restless legs syndrome) 07/14/2015   Atherosclerosis of native coronary artery with angina pectoris ([place]) 05/10/2015   Musculoskeletal chest pain 05/04/2015   Precordial pain 05/04/2015   Generalized anxiety disorder 03/14/2015   Primary insomnia 03/14/2015   Type 2 diabetes mellitus with diabetic neuropathy, with long-term current use of insulin ([place]) 02/16/2010   Hyperlipidemia LDL goal <70 02/16/2010   Essential hypertension 02/16/2010   GERD 02/16/2010   Bilateral knee pain 02/16/2010    PHYSICAL THERAPY DISCHARGE SUMMARY  Visits from Start of Care: 17  Current functional level related to goals / functional outcomes: Partially met goals-- see above. The patient cancelled visits due to having Covid and did not return for further assessment. This note is last known patient status.   Remaining deficits: See above   Education / Equipment: HEP   Patient agrees to discharge. Patient goals were partially met. Patient is being discharged due to not returning since the last visit.   Rudell Cobb, PT  Kerin Perna, PTA 09/11/20 12:02 PM   [facility] [place], [name], [address] Phone: [phone]    Fax:  [phone]  Name: Tonya Chandler MRN: [id number] Date of Birth: Jan 08, 1961

## 2020-09-12 ENCOUNTER — Telehealth: Payer: Self-pay

## 2020-09-12 NOTE — Telephone Encounter (Signed)
Patient called saying she got provider's msg to come in to see her in the next couple of days to discuss her results. Per patient, she attempted to get a sooner appt. Her next appt will be on 09/19/20. Patient mentioned she continues with the yeast infection and now has painful oral thrush. Requesting if provider can send in a rx to the pharmacy.

## 2020-09-13 MED ORDER — FLUCONAZOLE 150 MG PO TABS: 150.0000 mg | ORAL_TABLET | Freq: Once | ORAL | 1 refills | Status: AC

## 2020-09-13 MED ORDER — VALACYCLOVIR HCL 1 G PO TABS: 1000.0000 mg | ORAL_TABLET | Freq: Two times a day (BID) | ORAL | 0 refills | Status: DC

## 2020-09-13 NOTE — Telephone Encounter (Signed)
Left a detailed vm msg for patient regarding rxs sent to the pharmacy. Direct call back info provided.

## 2020-09-13 NOTE — Telephone Encounter (Signed)
Sent Rx for yeast Also sent antiviral Rx for HSV

## 2020-09-14 ENCOUNTER — Encounter: Payer: BC Managed Care – PPO | Admitting: Physical Therapy

## 2020-09-14 ENCOUNTER — Encounter: Payer: BC Managed Care – PPO | Admitting: Rehabilitative and Restorative Service Providers"

## 2020-09-17 ENCOUNTER — Encounter: Payer: Self-pay | Admitting: Rehabilitative and Restorative Service Providers"

## 2020-09-17 DIAGNOSIS — Z20822 Contact with and (suspected) exposure to covid-19: Secondary | ICD-10-CM | POA: Diagnosis not present

## 2020-09-18 ENCOUNTER — Encounter: Payer: BC Managed Care – PPO | Admitting: Rehabilitative and Restorative Service Providers"

## 2020-09-19 ENCOUNTER — Other Ambulatory Visit: Payer: Self-pay

## 2020-09-19 ENCOUNTER — Telehealth (INDEPENDENT_AMBULATORY_CARE_PROVIDER_SITE_OTHER): Payer: BC Managed Care – PPO | Admitting: Osteopathic Medicine

## 2020-09-19 ENCOUNTER — Encounter: Payer: Self-pay | Admitting: Osteopathic Medicine

## 2020-09-19 VITALS — BP 149/89

## 2020-09-19 DIAGNOSIS — U071 COVID-19: Secondary | ICD-10-CM

## 2020-09-19 DIAGNOSIS — E1140 Type 2 diabetes mellitus with diabetic neuropathy, unspecified: Secondary | ICD-10-CM

## 2020-09-19 DIAGNOSIS — E1165 Type 2 diabetes mellitus with hyperglycemia: Secondary | ICD-10-CM

## 2020-09-19 DIAGNOSIS — I10 Essential (primary) hypertension: Secondary | ICD-10-CM

## 2020-09-19 DIAGNOSIS — IMO0002 Reserved for concepts with insufficient information to code with codable children: Secondary | ICD-10-CM

## 2020-09-19 DIAGNOSIS — B009 Herpesviral infection, unspecified: Secondary | ICD-10-CM | POA: Diagnosis not present

## 2020-09-19 MED ORDER — GUAIFENESIN-CODEINE 100-10 MG/5ML PO SYRP: 5.0000 mL | ORAL_SOLUTION | Freq: Four times a day (QID) | ORAL | 0 refills | Status: DC | PRN

## 2020-09-19 MED ORDER — VALACYCLOVIR HCL 500 MG PO TABS: 500.0000 mg | ORAL_TABLET | Freq: Two times a day (BID) | ORAL | 0 refills | Status: DC

## 2020-09-19 NOTE — Progress Notes (Signed)
Telemedicine Visit via  Video & Audio (App used: MYCHART)  I connected with Tonya Chandler on 09/19/20 at 11:09 AM  by phone or  telemedicine application as noted above  I verified that I am speaking with or regarding  the correct patient using two identifiers.  Participants: Myself, Dr Emeterio Reeve DO Patient: Tonya Chandler Patient proxy if applicable: none Other, if applicable: none  Patient is at home I am in office at [facility]    I discussed the limitations of evaluation and management  by telemedicine and the availability of in person appointments.  The participant(s) above expressed understanding and  agreed to proceed with this appointment via telemedicine.       History of Present Illness: Tonya Chandler is a 60 y.o. female who would like to discuss   BP recheck - see below, BP better today than previous visit, no CP/SOB  COVID - onset symptoms 4 days ago, coughing but no SOB  Hyperglycemia - has been taking Humalog 20 units w/ lunch/dinner, recent COVID and steroid injection concern for elevated fasting Glc into 300's  HSV - reports had previous partner who might have been positive (rumors), she had some episodes over the years of small scratch-like area on groin but never bad outbreak until last visit (+)HSV viral culture.         Observations/Objective: BP (!) 149/89  BP Readings from Last 3 Encounters:  09/19/20 (!) 149/89  09/04/20 (!) 171/106  07/03/20 (!) 152/79   Exam: Normal Speech.  NAD  Lab and Radiology Results No results found for this or any previous visit (from the past 72 hour(s)). No results found.     Assessment and Plan: 60 y.o. female with The primary encounter diagnosis was Type 2 diabetes, uncontrolled, with neuropathy ([place]). Diagnoses of COVID-19 and HSV-2 (herpes simplex virus 2) infection - genital, (+)viral culture 08/2020 were also pertinent to this visit.  1. Type 2 diabetes, uncontrolled, with neuropathy  (HCC) Sliding scale meal time insulin as below for the next few weeks, resistant scale, hypoglycemia precautions reviewed   2. COVID-19 Mild illness other than cough, Rx sent for syrup, ER precautions reviewed   3. HSV-2 (herpes simplex virus 2) infection - genital, (+)viral culture 08/2020 Discussed suppressive vs prn therapy, pt opts for prn   4. Primary hypertension BP better today   PDMP not reviewed this encounter. No orders of the defined types were placed in this encounter.  Meds ordered this encounter  Medications   guaiFENesin-codeine (ROBITUSSIN AC) 100-10 MG/5ML syrup    Sig: Take 5-10 mLs by mouth 4 (four) times daily as needed for cough or congestion.    Dispense:  236 mL    Refill:  0   valACYclovir (VALTREX) 500 MG tablet    Sig: Take 1 tablet (500 mg total) by mouth 2 (two) times daily for 3 days. As needed for outbreak    Dispense:  18 tablet    Refill:  0   Patient Instructions  Glucose Insulin dose (Humalog) <70  0 - eat something sugary!  70-130  0 131-180 10 181-240 15 241-300 20  301-350 25 >400  30  Sent Rx for cough  Sent Rx for as-needed HSV outbreak  Instructions sent via MyChart.   Follow Up Instructions: No follow-ups on file.    I discussed the assessment and treatment plan with the patient. The patient was provided an opportunity to ask questions and all were answered. The patient agreed with the plan  and demonstrated an understanding of the instructions.   The patient was advised to call back or seek an in-person evaluation if any new concerns, if symptoms worsen or if the condition fails to improve as anticipated.  30 minutes of non-face-to-face time was provided during this encounter.      . . . . . . . . . . . . . Marland Kitchen                   Historical information moved to improve visibility of documentation.  Past Medical History:  Diagnosis Date   Abnormal ECG 04/01/2018   Abnormal stress  echocardiogram 05/24/2019   Formatting of this note might be different from the original. Added automatically from request for surgery [id number]   Abnormal stress test 05/24/2019   Formatting of this note might be different from the original. Added automatically from request for surgery [id number]   Anemia    Angina pectoris ([place]) 06/04/2019   Anxiety    Atherosclerosis of native coronary artery with angina pectoris ([place]) 05/10/2015   Formatting of this note might be different from the original. 30% lesion-[place]   Bilateral carpal tunnel syndrome 05/23/2017   Bilateral knee pain 02/16/2010   Qualifier: Diagnosis of  By: Koleen Nimrod MD, Jeffrey     Carpal tunnel syndrome, right 12/16/2017   Colon polyps    Depression    DEPRESSION 02/16/2010   Qualifier: Diagnosis of  By: Koleen Nimrod MD, Dellis Filbert     Depression, major, in partial remission ([place]) 07/14/2015   Diabetes mellitus    Diverticulosis    Dupuytren's contracture of right hand 06/24/2017   Essential hypertension 02/16/2010   Qualifier: Diagnosis of  By: Koleen Nimrod MD, Dellis Filbert     Gastro-esophageal reflux    Gastrointestinal ulcer    Generalized anxiety disorder 03/14/2015   GERD 02/16/2010   Qualifier: Diagnosis of  By: Koleen Nimrod MD, Dellis Filbert     H/O: GI bleed    Said it was due to taking metformin   Hyperlipidemia    Hyperlipidemia LDL goal <70 02/16/2010   Qualifier: Diagnosis of  By: Koleen Nimrod MD, Dellis Filbert     Hypertension    IBS (irritable bowel syndrome)    Impingement syndrome of shoulder, right 06/24/2017   Midline low back pain with left-sided sciatica 12/29/2013   Musculoskeletal chest pain 05/04/2015   Obesity    Post-nasal drainage 02/10/2020   Last Assessment & Plan:  Formatting of this note might be different from the original. Concern over postnasal drainage. Throat awareness symptoms ever since she was packed in the right nostril last summer.  Was treated with antibiotics with some improvement.  Causes intermittent hoarseness and loss of  voice.  Non-smoker. EXAM by anterior rhinoscopy shows no obvious polyps or purulence or obstructi   Precordial pain 05/04/2015   Primary insomnia 03/14/2015   Radiculitis of right cervical region 01/20/2018   Radiculopathy 04/02/2018   Right hand pain 06/24/2017   RLS (restless legs syndrome) 07/14/2015   Type 2 diabetes mellitus with diabetic neuropathy, with long-term current use of insulin ([place]) 02/16/2010   Qualifier: Diagnosis of  By: Koleen Nimrod MD, Dellis Filbert     Unstable angina [facility]) 06/11/2019   Past Surgical History:  Procedure Laterality Date   ABDOMINAL HYSTERECTOMY     ANTERIOR CERVICAL DECOMP/DISCECTOMY FUSION N/A 04/02/2018   Procedure: ANTERIOR CERVICAL DECOMPRESSION FUSION, CERVICAL FOUR-FIVE, CERVICAL FIVE-SIX, WITH INSTRUMENTATION AND ALLOGRAFT;  Surgeon: Phylliss Bob, MD;  Location: [place];  Service: Orthopedics;  Laterality: N/A;  COLONOSCOPY WITH ESOPHAGOGASTRODUODENOSCOPY (EGD)  05/04/2009   CORONARY ARTERY BYPASS GRAFT N/A 06/14/2019   Procedure: CORONARY ARTERY BYPASS GRAFTING (CABG) using LIMA to LAD; Endoscopic Right Greater Saphenous Vein Harvest: SVG to Circ (distal); SVG to RCA (distal).;  Surgeon: [name], MD;  Location: [place];  Service: Open Heart Surgery;  Laterality: N/A;   ENDOVEIN HARVEST OF GREATER SAPHENOUS VEIN Right 06/14/2019   Procedure: [place] Of Greater Saphenous Vein;  Surgeon: [name], MD;  Location: [place];  Service: Open Heart Surgery;  Laterality: Right;   LEFT HEART CATH AND CORONARY ANGIOGRAPHY N/A 06/11/2019   Procedure: LEFT HEART CATH AND CORONARY ANGIOGRAPHY;  Surgeon: Wellington Hampshire, MD;  Location: [place] [facility];  Service: Cardiovascular;  Laterality: N/A;   PIP JOINT FUSION Bilateral    pinky   TEE WITHOUT CARDIOVERSION N/A 06/14/2019   Procedure: TRANSESOPHAGEAL ECHOCARDIOGRAM (TEE);  Surgeon: [name], MD;  Location: [place];  Service: Open Heart Surgery;  Laterality: N/A;   Social History    Tobacco Use   Smoking status: Never   Smokeless tobacco: Never  Substance Use Topics   Alcohol use: No   family history includes Colon cancer in her mother; Colon polyps (age of onset: 39) in her daughter; Dementia in her mother; Diabetes in her brother, father, and mother; Epilepsy in her brother; Heart attack in her brother, father, and mother; High blood pressure in her brother and mother; Ovarian cancer in her mother.  Medications: Current Outpatient Medications  Medication Sig Dispense Refill   guaiFENesin-codeine (ROBITUSSIN AC) 100-10 MG/5ML syrup Take 5-10 mLs by mouth 4 (four) times daily as needed for cough or congestion. 236 mL 0   valACYclovir (VALTREX) 500 MG tablet Take 1 tablet (500 mg total) by mouth 2 (two) times daily for 3 days. As needed for outbreak 18 tablet 0   acetaminophen (TYLENOL) 325 MG tablet Take 2 tablets (650 mg total) by mouth every 6 (six) hours as needed for mild pain.     aspirin EC 81 MG tablet Take 81 mg by mouth daily. Swallow whole.     atorvastatin (LIPITOR) 40 MG tablet TAKE 1 TABLET(40 MG) BY MOUTH DAILY 90 tablet 3   celecoxib (CELEBREX) 100 MG capsule TAKE 1 CAPSULE BY MOUTH TWICE DAILY AS NEEDED FOR ARTHRITIS 180 capsule 0   Cyanocobalamin (VITAMIN B-12) 2500 MCG SUBL Place 2,500 mcg under the tongue daily.     cyclobenzaprine (FLEXERIL) 10 MG tablet TAKE 1 TABLET(10 MG) BY MOUTH AT BEDTIME (Patient taking differently: Take 10 mg by mouth at bedtime.) 30 tablet 3   Dulaglutide (TRULICITY) 3 0000000 SOPN Inject 3 mg as directed once a week. 6 mL 1   DULoxetine (CYMBALTA) 60 MG capsule TAKE 1 CAPSULE(60 MG) BY MOUTH DAILY (Patient taking differently: Take 60 mg by mouth daily.) 90 capsule 3   estradiol (ESTRACE) 1 MG tablet TAKE 1 TABLET(1 MG) BY MOUTH DAILY 90 tablet 3   Famotidine 20 MG CHEW Chew 20 mg by mouth daily as needed (reflux).      gabapentin (NEURONTIN) 800 MG tablet TAKE 1 TABLET BY MOUTH IN THE MORNING, 1 TABLET AT MIDDAY AND 2  TABLETS AT BEDTIME 120 tablet 3   insulin glargine-yfgn (SEMGLEE, YFGN,) 100 UNIT/ML Pen Inject 60-100 Units into the skin daily. Target FBG 120 15 mL 99   insulin lispro (HUMALOG KWIKPEN) 200 UNIT/ML KwikPen Inject 10 units subcutaneously as directed prior to lunch and dinner 15 mL 3  lidocaine (XYLOCAINE) 5 % ointment Apply 1 application topically every 3 (three) hours as needed. 35 g 1   lisinopril (ZESTRIL) 5 MG tablet Take 1 tablet (5 mg total) by mouth daily. 90 tablet 1   loteprednol (LOTEMAX) 0.5 % ophthalmic suspension Place 1 drop into the right eye daily as needed (dry eye).      magnesium oxide (MAG-OX) 400 MG tablet Take 800 mg by mouth at bedtime.      methocarbamol (ROBAXIN) 500 MG tablet Take 1 tablet (500 mg total) by mouth 2 (two) times daily. 20 tablet 0   metoprolol tartrate (LOPRESSOR) 25 MG tablet TAKE 1 TABLET(25 MG) BY MOUTH TWICE DAILY (Patient taking differently: Take 25 mg by mouth 2 (two) times daily.) 180 tablet 3   mirtazapine (REMERON) 45 MG tablet TAKE 1 TABLET(45 MG) BY MOUTH AT BEDTIME 90 tablet 3   nitroGLYCERIN (NITROSTAT) 0.4 MG SL tablet Place 1 tablet (0.4 mg total) under the tongue every 5 (five) minutes as needed for chest pain. 20 tablet 1   omeprazole (PRILOSEC) 40 MG capsule Take 40 mg by mouth daily.     rOPINIRole (REQUIP) 3 MG tablet Take 1 tablet (3 mg total) by mouth at bedtime. 90 tablet 3   No current facility-administered medications for this visit.   Allergies  Allergen Reactions   Adhesive [Tape] Hives    Welts--PLEASE USE HYPO ALLERGIC TAPE   Ampicillin Diarrhea    Did it involve swelling of the face/tongue/throat, SOB, or low BP? No Did it involve sudden or severe rash/hives, skin peeling, or any reaction on the inside of your mouth or nose? No Did you need to seek medical attention at a hospital or doctor's office? No When did it last happen? 30-40 years ago      If all above answers are "NO", may proceed with cephalosporin use.     Cefprozil Other (See Comments)    Acid reflux/indigestion issues   Meloxicam Other (See Comments)    Acid reflux/indigestion issues     If phone visit, billing and coding can please add appropriate modifier if needed

## 2020-09-19 NOTE — Patient Instructions (Signed)
Glucose Insulin dose (Humalog) <70  0 - eat something sugary!  70-130  0 131-180 10 181-240 15 241-300 20  301-350 25 >400  30  Sent Rx for cough  Sent Rx for as-needed HSV outbreak

## 2020-09-20 ENCOUNTER — Encounter: Payer: BC Managed Care – PPO | Admitting: Physical Therapy

## 2020-10-03 DIAGNOSIS — S83281A Other tear of lateral meniscus, current injury, right knee, initial encounter: Secondary | ICD-10-CM | POA: Diagnosis not present

## 2020-10-03 DIAGNOSIS — S83241A Other tear of medial meniscus, current injury, right knee, initial encounter: Secondary | ICD-10-CM | POA: Diagnosis not present

## 2020-10-09 ENCOUNTER — Other Ambulatory Visit: Payer: Self-pay

## 2020-10-09 DIAGNOSIS — F419 Anxiety disorder, unspecified: Secondary | ICD-10-CM

## 2020-10-09 DIAGNOSIS — F32A Depression, unspecified: Secondary | ICD-10-CM

## 2020-10-09 MED ORDER — INSULIN PEN NEEDLE 31G X 8 MM MISC: 99 refills | Status: DC

## 2020-10-09 MED ORDER — DULOXETINE HCL 60 MG PO CPEP: ORAL_CAPSULE | ORAL | 3 refills | Status: DC

## 2020-10-09 NOTE — Telephone Encounter (Signed)
Patient called requesting a new rx for pen needles. Patient needs it for her Humalog and Semaglutide rxs. Rxs pended.

## 2020-10-23 ENCOUNTER — Encounter: Payer: Self-pay | Admitting: Cardiology

## 2020-10-23 ENCOUNTER — Ambulatory Visit (INDEPENDENT_AMBULATORY_CARE_PROVIDER_SITE_OTHER): Payer: BC Managed Care – PPO | Admitting: Cardiology

## 2020-10-23 ENCOUNTER — Other Ambulatory Visit: Payer: Self-pay

## 2020-10-23 VITALS — BP 153/105 | HR 79 | Ht 60.0 in | Wt 166.1 lb

## 2020-10-23 DIAGNOSIS — E1140 Type 2 diabetes mellitus with diabetic neuropathy, unspecified: Secondary | ICD-10-CM | POA: Diagnosis not present

## 2020-10-23 DIAGNOSIS — Z951 Presence of aortocoronary bypass graft: Secondary | ICD-10-CM | POA: Diagnosis not present

## 2020-10-23 DIAGNOSIS — I10 Essential (primary) hypertension: Secondary | ICD-10-CM | POA: Diagnosis not present

## 2020-10-23 DIAGNOSIS — E785 Hyperlipidemia, unspecified: Secondary | ICD-10-CM | POA: Diagnosis not present

## 2020-10-23 DIAGNOSIS — Z794 Long term (current) use of insulin: Secondary | ICD-10-CM | POA: Diagnosis not present

## 2020-10-23 NOTE — Patient Instructions (Signed)
Medication Instructions:  Your physician recommends that you continue on your current medications as directed. Please refer to the Current Medication list given to you today.  *If you need a refill on your cardiac medications before your next appointment, please call your pharmacy*   Lab Work: Your physician recommends that you return for lab work today: hemoglobin a 1 c, direct ldl  If you have labs (blood work) drawn today and your tests are completely normal, you will receive your results only by: [place] (if you have MyChart) OR A paper copy in the mail If you have any lab test that is abnormal or we need to change your treatment, we will call you to review the results.   Testing/Procedures: Your physician has requested that you have an echocardiogram. Echocardiography is a painless test that uses sound waves to create images of your heart. It provides your doctor with information about the size and shape of your heart and how well your heart's chambers and valves are working. This procedure takes approximately one hour. There are no restrictions for this procedure.    Follow-Up: At [facility], you and your health needs are our priority.  As part of our continuing mission to provide you with exceptional heart care, we have created designated Provider Care Teams.  These Care Teams include your primary Cardiologist (physician) and Advanced Practice Providers (APPs -  Physician Assistants and Nurse Practitioners) who all work together to provide you with the care you need, when you need it.  We recommend signing up for the patient portal called "MyChart".  Sign up information is provided on this After Visit Summary.  MyChart is used to connect with patients for Virtual Visits (Telemedicine).  Patients are able to view lab/test results, encounter notes, upcoming appointments, etc.  Non-urgent messages can be sent to your provider as well.   To learn more about what you can do with  MyChart, go to [email or web address].    Your next appointment:   5 month(s)  The format for your next appointment:   In Person  Provider:   Jenne Campus, MD   Other Instructions  Echocardiogram An echocardiogram is a test that uses sound waves (ultrasound) to produce images of the heart. Images from an echocardiogram can provide important information about: Heart size and shape. The size and thickness and movement of your heart's walls. Heart muscle function and strength. Heart valve function or if you have stenosis. Stenosis is when the heart valves are too narrow. If blood is flowing backward through the heart valves (regurgitation). A tumor or infectious growth around the heart valves. Areas of heart muscle that are not working well because of poor blood flow or injury from a heart attack. Aneurysm detection. An aneurysm is a weak or damaged part of an artery wall. The wall bulges out from the normal force of blood pumping through the body. Tell a health care provider about: Any allergies you have. All medicines you are taking, including vitamins, herbs, eye drops, creams, and over-the-counter medicines. Any blood disorders you have. Any surgeries you have had. Any medical conditions you have. Whether you are pregnant or may be pregnant. What are the risks? Generally, this is a safe test. However, problems may occur, including an allergic reaction to dye (contrast) that may be used during the test. What happens before the test? No specific preparation is needed. You may eat and drink normally. What happens during the test?  You will take off your clothes from  the waist up and put on a hospital gown. Electrodes or electrocardiogram (ECG)patches may be placed on your chest. The electrodes or patches are then connected to a device that monitors your heart rate and rhythm. You will lie down on a table for an ultrasound exam. A gel will be applied to your chest to help  sound waves pass through your skin. A handheld device, called a transducer, will be pressed against your chest and moved over your heart. The transducer produces sound waves that travel to your heart and bounce back (or "echo" back) to the transducer. These sound waves will be captured in real-time and changed into images of your heart that can be viewed on a video monitor. The images will be recorded on a computer and reviewed by your health care provider. You may be asked to change positions or hold your breath for a short time. This makes it easier to get different views or better views of your heart. In some cases, you may receive contrast through an IV in one of your veins. This can improve the quality of the pictures from your heart. The procedure may vary among health care providers and hospitals. What can I expect after the test? You may return to your normal, everyday life, including diet, activities, and medicines, unless your health care provider tells you not to do that. Follow these instructions at home: It is up to you to get the results of your test. Ask your health care provider, or the department that is doing the test, when your results will be ready. Keep all follow-up visits. This is important. Summary An echocardiogram is a test that uses sound waves (ultrasound) to produce images of the heart. Images from an echocardiogram can provide important information about the size and shape of your heart, heart muscle function, heart valve function, and other possible heart problems. You do not need to do anything to prepare before this test. You may eat and drink normally. After the echocardiogram is completed, you may return to your normal, everyday life, unless your health care provider tells you not to do that. This information is not intended to replace advice given to you by your health care provider. Make sure you discuss any questions you have with your health care  provider. Document Revised: 08/31/2019 Document Reviewed: 08/31/2019 Elsevier Patient Education  2022 [employer].
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
## 2020-10-24 LAB — LDL CHOLESTEROL, DIRECT: LDL Direct: 79 mg/dL (ref 0–99)

## 2020-10-24 LAB — HEMOGLOBIN A1C
Est. average glucose Bld gHb Est-mCnc: 220 mg/dL
Hgb A1c MFr Bld: 9.3 % — ABNORMAL HIGH (ref 4.8–5.6)

## 2020-10-25 ENCOUNTER — Telehealth: Payer: Self-pay | Admitting: Neurology

## 2020-10-25 NOTE — Telephone Encounter (Signed)
Received request for medical clearance for knee surgery. Patient needs a visit with any provider to evaluate for clearance (Dr. Sheppard Coil patient). Please let me know who this is scheduled with so I can pass along the paperwork. Thanks!

## 2020-10-26 NOTE — Telephone Encounter (Signed)
Scheduled with Tonya Chandler on 11/06/20 for surgical clearance for knee surgery. Pt needed Monday afternoon. AM

## 2020-10-27 ENCOUNTER — Telehealth: Payer: Self-pay

## 2020-10-27 NOTE — Telephone Encounter (Signed)
   [facility] Pre-operative Risk Assessment    Request for surgical clearance:  What type of surgery is being performed? Right Knee Arthroscopy    When is this surgery scheduled? 11/17/2020   What type of clearance is required (medical clearance vs. Pharmacy clearance to hold med vs. Both)? Both  Are there any medications that need to be held prior to surgery and how long?Not specified however, the patient is on ASA 46m   Practice name and name of physician performing surgery? Dr. FFrederik Pearat GWeinert   What is your office phone number: [phone]   7.   What is your office fax number: [phone] 8.   Anesthesia type (None, local, MAC, general) ? [facility]   JBasil DessPrevatt 10/27/2020, 7:44 AM  _________________________________________________________________   (provider comments below)

## 2020-10-27 NOTE — Telephone Encounter (Signed)
Patient was recently seen a few days ago by Dr. Agustin Cree, the preop note mentions "Intensive evaluation before her surgery.  Her ability to exercise was normal problematic before she end up having a knee injury therefore from cardiac standpoint reviewed for low risk surgery lack knee surgery that should be no problem to proceed.  Again we will try to get echocardiogram but this tenotomized before that surgery."  Dr. Agustin Cree, you note mentions echocardiogram before the surgery prior to final clearance, right?  Please forward your response to P CV DIV PREOP

## 2020-11-01 NOTE — Telephone Encounter (Signed)
Echo scheduled for 10/14

## 2020-11-03 ENCOUNTER — Ambulatory Visit (HOSPITAL_BASED_OUTPATIENT_CLINIC_OR_DEPARTMENT_OTHER)
Admission: RE | Admit: 2020-11-03 | Discharge: 2020-11-03 | Disposition: A | Discharge status: Home / Self Care | Payer: BC Managed Care – PPO | Source: Ambulatory Visit | Attending: Cardiology | Admitting: Cardiology

## 2020-11-03 ENCOUNTER — Other Ambulatory Visit: Payer: Self-pay

## 2020-11-03 DIAGNOSIS — Z951 Presence of aortocoronary bypass graft: Secondary | ICD-10-CM

## 2020-11-03 DIAGNOSIS — I10 Essential (primary) hypertension: Secondary | ICD-10-CM

## 2020-11-05 LAB — ECHOCARDIOGRAM COMPLETE
AR max vel: 2.26 cm2
AV Area VTI: 2.44 cm2
AV Area mean vel: 2.17 cm2
AV Mean grad: 5 mmHg
AV Peak grad: 8.1 mmHg
Ao pk vel: 1.42 m/s
Area-P 1/2: 3.65 cm2
Calc EF: 57.6 %
S' Lateral: 3.4 cm
Single Plane A2C EF: 60.1 %
Single Plane A4C EF: 52.7 %

## 2020-11-06 ENCOUNTER — Encounter: Payer: Self-pay | Admitting: Physician Assistant

## 2020-11-06 ENCOUNTER — Other Ambulatory Visit: Payer: Self-pay

## 2020-11-06 ENCOUNTER — Ambulatory Visit (INDEPENDENT_AMBULATORY_CARE_PROVIDER_SITE_OTHER): Payer: BC Managed Care – PPO | Admitting: Physician Assistant

## 2020-11-06 VITALS — BP 136/83 | HR 72 | Temp 98.2°F | Wt 167.1 lb

## 2020-11-06 DIAGNOSIS — S83206D Unspecified tear of unspecified meniscus, current injury, right knee, subsequent encounter: Secondary | ICD-10-CM

## 2020-11-06 DIAGNOSIS — Z01818 Encounter for other preprocedural examination: Secondary | ICD-10-CM

## 2020-11-06 DIAGNOSIS — Z23 Encounter for immunization: Secondary | ICD-10-CM | POA: Diagnosis not present

## 2020-11-06 DIAGNOSIS — E1140 Type 2 diabetes mellitus with diabetic neuropathy, unspecified: Secondary | ICD-10-CM | POA: Diagnosis not present

## 2020-11-06 DIAGNOSIS — F4321 Adjustment disorder with depressed mood: Secondary | ICD-10-CM

## 2020-11-06 DIAGNOSIS — Z794 Long term (current) use of insulin: Secondary | ICD-10-CM

## 2020-11-06 MED ORDER — TRULICITY 4.5 MG/0.5ML ~~LOC~~ SOAJ: 4.5000 mg | SUBCUTANEOUS | 0 refills | Status: DC

## 2020-11-06 NOTE — Progress Notes (Signed)
Subjective:    Patient ID: Tonya Chandler, female    DOB: 1960/12/12, 60 y.o.   MRN: [id number]  HPI Pt is a 60 yo female with CAD, HTN, hx of CABG x 3,  T2DM who presents to the clinic for pre surgery clearance for right knee meniscal repair surgery.   She has been cleared by cardiology already.   She admits her sugars are not controlled. She has not been eating healthy. Her daughter died of drug overdose in 08/24/22 and since then she has just not cared. She is taking trulicity but not taking any other medications. There was some cost issues with farxiga/invokana. She is also taking Semglee 80 units at bedtime and humalog sliding scale with meals. She is checking sugars and they have not been good in the upper 180s.   .. Active Ambulatory Problems    Diagnosis Date Noted   Type 2 diabetes mellitus with diabetic neuropathy, with long-term current use of insulin ([place]) 02/16/2010   Hyperlipidemia LDL goal <70 02/16/2010   Essential hypertension 02/16/2010   GERD 02/16/2010   Bilateral knee pain 02/16/2010   Right hand pain 06/24/2017   Impingement syndrome of shoulder, right 06/24/2017   Dupuytren's contracture of right hand 06/24/2017   Carpal tunnel syndrome, right 12/16/2017   Radiculitis of right cervical region 01/20/2018   Radiculopathy 04/02/2018   Abnormal ECG 04/01/2018   Abnormal stress test 05/24/2019   Atherosclerosis of native coronary artery with angina pectoris ([place]) 05/10/2015   Depression, major, in partial remission ([place]) 07/14/2015   Generalized anxiety disorder 03/14/2015   Musculoskeletal chest pain 05/04/2015   Precordial pain 05/04/2015   Primary insomnia 03/14/2015   RLS (restless legs syndrome) 07/14/2015   Bilateral carpal tunnel syndrome 05/23/2017   Angina pectoris ([place]) 06/04/2019   Unstable angina ([place]) 06/11/2019   S/P CABG x 3 LIMA to LAD, SVG sequential to RCA and circumflex in May 2021    Obesity    IBS (irritable bowel syndrome)    Hypertension     Hyperlipidemia    H/O: GI bleed    Gastrointestinal ulcer    Gastro-esophageal reflux    Diverticulosis    Depression    Colon polyps    Anxiety    Anemia    Abnormal stress echocardiogram 05/24/2019   Post-nasal drainage 02/10/2020   HSV-2 (herpes simplex virus 2) infection - genital, (+)viral culture 08/2020 09/11/2020   Acute meniscal tear of right knee 11/07/2020   Grief 11/07/2020   Resolved Ambulatory Problems    Diagnosis Date Noted   DEPRESSION 02/16/2010   Midline low back pain with left-sided sciatica 12/29/2013   Past Medical History:  Diagnosis Date   Diabetes mellitus        Review of Systems See HPI.     Objective:   Physical Exam Vitals reviewed.  Constitutional:      Appearance: Normal appearance. She is obese.  HENT:     Head: Normocephalic.  Cardiovascular:     Rate and Rhythm: Normal rate and regular rhythm.     Pulses: Normal pulses.     Heart sounds: Normal heart sounds.  Pulmonary:     Effort: Pulmonary effort is normal.     Breath sounds: Normal breath sounds.  Musculoskeletal:     Right lower leg: No edema.     Left lower leg: No edema.  Neurological:     General: No focal deficit present.     Mental Status: She is alert and oriented to  person, place, and time.  Psychiatric:     Comments: tearful      .Marland Kitchen Lab Results  Component Value Date   HGBA1C 9.3 (H) 10/23/2020       Assessment & Plan:  .Marland KitchenDianna was seen today for pre-op exam.  Diagnoses and all orders for this visit:  Pre-op examination -     COMPLETE METABOLIC PANEL WITH GFR -     CBC w/Diff/Platelet  Need for influenza vaccination -     Flu Vaccine QUAD 6+ mos PF IM (Fluarix Quad PF)  Acute meniscal tear of right knee, subsequent encounter  Grief  Type 2 diabetes mellitus with diabetic neuropathy, with long-term current use of insulin (HCC) -     Dulaglutide (TRULICITY) 4.5 LV/7.4XV SOPN; Inject 4.5 mg into the skin once a week. -     canagliflozin  (INVOKANA) 300 MG TABS tablet; Take 1 tablet (300 mg total) by mouth daily before breakfast.  A1C was not to goal just last week.  Increased trulicity to 4mg  weekly.  Added invokana(pt requested this SGLT-2) Continue with insulin.  Discussed DM diet.  BP close to goal. Managed by cardiology.  On STATIN.  CBC and CMP ordered for surgery clearance. Flu shot given.   I suspect ortho will want [place] to get A1C down to proceed with surgery.  Follow up in 3 months.   Discussed grief. She is in hospice counseling. Declined any medication changes.

## 2020-11-07 DIAGNOSIS — F4321 Adjustment disorder with depressed mood: Secondary | ICD-10-CM | POA: Insufficient documentation

## 2020-11-07 DIAGNOSIS — S83206A Unspecified tear of unspecified meniscus, current injury, right knee, initial encounter: Secondary | ICD-10-CM | POA: Insufficient documentation

## 2020-11-07 LAB — CBC WITH DIFFERENTIAL/PLATELET
Absolute Monocytes: 659 cells/uL (ref 200–950)
Basophils Absolute: 62 cells/uL (ref 0–200)
Basophils Relative: 0.7 %
Eosinophils Absolute: 525 cells/uL — ABNORMAL HIGH (ref 15–500)
Eosinophils Relative: 5.9 %
HCT: 44.3 % (ref 35.0–45.0)
Hemoglobin: 14.5 g/dL (ref 11.7–15.5)
Lymphs Abs: 2679 cells/uL (ref 850–3900)
MCH: 28.4 pg (ref 27.0–33.0)
MCHC: 32.7 g/dL (ref 32.0–36.0)
MCV: 86.9 fL (ref 80.0–100.0)
MPV: 11.4 fL (ref 7.5–12.5)
Monocytes Relative: 7.4 %
Neutro Abs: 4975 cells/uL (ref 1500–7800)
Neutrophils Relative %: 55.9 %
Platelets: 315 10*3/uL (ref 140–400)
RBC: 5.1 10*6/uL (ref 3.80–5.10)
RDW: 13.6 % (ref 11.0–15.0)
Total Lymphocyte: 30.1 %
WBC: 8.9 10*3/uL (ref 3.8–10.8)

## 2020-11-07 LAB — COMPLETE METABOLIC PANEL WITH GFR
AG Ratio: 1.9 (calc) (ref 1.0–2.5)
ALT: 15 U/L (ref 6–29)
AST: 21 U/L (ref 10–35)
Albumin: 4.3 g/dL (ref 3.6–5.1)
Alkaline phosphatase (APISO): 102 U/L (ref 37–153)
BUN: 8 mg/dL (ref 7–25)
CO2: 30 mmol/L (ref 20–32)
Calcium: 9.6 mg/dL (ref 8.6–10.4)
Chloride: 101 mmol/L (ref 98–110)
Creat: 0.59 mg/dL (ref 0.50–1.05)
Globulin: 2.3 g/dL (calc) (ref 1.9–3.7)
Glucose, Bld: 154 mg/dL — ABNORMAL HIGH (ref 65–99)
Potassium: 4.5 mmol/L (ref 3.5–5.3)
Sodium: 140 mmol/L (ref 135–146)
Total Bilirubin: 0.4 mg/dL (ref 0.2–1.2)
Total Protein: 6.6 g/dL (ref 6.1–8.1)
eGFR: 102 mL/min/{1.73_m2} (ref >=60)

## 2020-11-07 MED ORDER — CANAGLIFLOZIN 300 MG PO TABS: 300.0000 mg | ORAL_TABLET | Freq: Every day | ORAL | 1 refills | Status: DC

## 2020-11-07 NOTE — Progress Notes (Signed)
Labs look great. No concerns here for surgery.

## 2020-11-09 ENCOUNTER — Telehealth: Payer: Self-pay

## 2020-11-09 NOTE — Telephone Encounter (Signed)
Medication: canagliflozin (INVOKANA) 300 MG TABS tablet Prior authorization submitted via CoverMyMeds on 11/09/2020 PA submission pending

## 2020-11-09 NOTE — Telephone Encounter (Signed)
   Primary Cardiologist: Jenne Campus, MD  Chart reviewed as part of pre-operative protocol coverage. Given past medical history and time since last visit, based on ACC/AHA guidelines, Tonya Chandler would be at acceptable risk for the planned procedure without further cardiovascular testing.   Her RCRI is a class III risk, 6.6% risk of major cardiac event.  I will route this recommendation to the requesting party via Epic fax function and remove from pre-op pool.  Please call with questions.  Jossie Ng. Orren Pietsch NP-C    11/09/2020, 2:25 PM [facility] [place] [address] Office [phone] Fax [phone]

## 2020-11-10 ENCOUNTER — Telehealth: Payer: Self-pay

## 2020-11-10 NOTE — Telephone Encounter (Signed)
Called patient. Patient made aware of the results. Verbalized understanding. No questions or concerns expressed at this time.

## 2020-11-17 ENCOUNTER — Telehealth: Payer: Self-pay | Admitting: Neurology

## 2020-11-17 DIAGNOSIS — M94261 Chondromalacia, right knee: Secondary | ICD-10-CM | POA: Diagnosis not present

## 2020-11-17 DIAGNOSIS — X58XXXA Exposure to other specified factors, initial encounter: Secondary | ICD-10-CM | POA: Diagnosis not present

## 2020-11-17 DIAGNOSIS — W28XXXA Contact with powered lawn mower, initial encounter: Secondary | ICD-10-CM | POA: Diagnosis not present

## 2020-11-17 DIAGNOSIS — S83231A Complex tear of medial meniscus, current injury, right knee, initial encounter: Secondary | ICD-10-CM | POA: Diagnosis not present

## 2020-11-17 DIAGNOSIS — G8918 Other acute postprocedural pain: Secondary | ICD-10-CM | POA: Diagnosis not present

## 2020-11-17 DIAGNOSIS — S83241A Other tear of medial meniscus, current injury, right knee, initial encounter: Secondary | ICD-10-CM | POA: Diagnosis not present

## 2020-11-17 DIAGNOSIS — S83261A Peripheral tear of lateral meniscus, current injury, right knee, initial encounter: Secondary | ICD-10-CM | POA: Diagnosis not present

## 2020-11-17 DIAGNOSIS — S83281A Other tear of lateral meniscus, current injury, right knee, initial encounter: Secondary | ICD-10-CM | POA: Diagnosis not present

## 2020-11-17 NOTE — Telephone Encounter (Signed)
Patient left vm stating her Tonya Chandler was denied and insurance will cover [name] or [name]. She has been on [name]. She is asking for an RX to be sent to pharmacy on file. Please advise.

## 2020-11-19 ENCOUNTER — Other Ambulatory Visit: Payer: Self-pay | Admitting: Osteopathic Medicine

## 2020-11-20 MED ORDER — DAPAGLIFLOZIN PROPANEDIOL 10 MG PO TABS: 10.0000 mg | ORAL_TABLET | Freq: Every day | ORAL | 0 refills | Status: DC

## 2020-11-22 NOTE — Telephone Encounter (Signed)
Patient made aware Farxiga sent to pharmacy.

## 2020-11-23 DIAGNOSIS — M25661 Stiffness of right knee, not elsewhere classified: Secondary | ICD-10-CM | POA: Diagnosis not present

## 2020-11-23 DIAGNOSIS — M6281 Muscle weakness (generalized): Secondary | ICD-10-CM | POA: Diagnosis not present

## 2020-11-23 NOTE — Telephone Encounter (Signed)
Medication: canagliflozin (INVOKANA) 300 MG TABS tablet Prior authorization determination received Medication has been denied Reason for denial:  "Coverage is provided in situations where the patient has tried both preferred formulary alternatives: [place] and [name]. Coverage cannot be authorized at this time"

## 2020-11-24 ENCOUNTER — Telehealth: Payer: Self-pay

## 2020-11-24 NOTE — Telephone Encounter (Signed)
PA submitted through CoverMyMeds and approved. Pharmacy aware and patient aware.    Approvedtoday CaseId:[id number];Status:Approved;Review Type:Prior Auth;Coverage Start Date:10/25/2020;Coverage End Date:11/24/2021;

## 2020-12-01 ENCOUNTER — Other Ambulatory Visit: Payer: Self-pay

## 2020-12-01 ENCOUNTER — Ambulatory Visit (INDEPENDENT_AMBULATORY_CARE_PROVIDER_SITE_OTHER): Payer: BC Managed Care – PPO | Admitting: Physical Therapy

## 2020-12-01 DIAGNOSIS — M6281 Muscle weakness (generalized): Secondary | ICD-10-CM

## 2020-12-01 DIAGNOSIS — R600 Localized edema: Secondary | ICD-10-CM

## 2020-12-01 DIAGNOSIS — M25561 Pain in right knee: Secondary | ICD-10-CM

## 2020-12-01 DIAGNOSIS — R2689 Other abnormalities of gait and mobility: Secondary | ICD-10-CM

## 2020-12-01 NOTE — Addendum Note (Signed)
Addended by: Colbert Ewing MARIE L on: 12/01/2020 09:55 AM   Modules accepted: Orders

## 2020-12-01 NOTE — Patient Instructions (Signed)
Access Code: [email or web address] URL: [email or web address] Date: 12/01/2020 Prepared by: Estill Bamberg April Thurnell Garbe  Exercises Prone Quadriceps Stretch with Strap - 1 x daily - 7 x weekly - 2 sets - 30 sec hold Prone Knee Flexion - 1 x daily - 7 x weekly - 2 sets - 10 reps Supine Active Straight Leg Raise - 1 x daily - 7 x weekly - 2 sets - 10 reps Seated Hamstring Stretch - 1 x daily - 7 x weekly - 2 sets - 30 sec hold Sidelying Hip Abduction - 1 x daily - 7 x weekly - 2 sets - 10 reps Wall Quarter Squat - 1 x daily - 7 x weekly - 2 sets - 10 reps

## 2020-12-01 NOTE — Therapy (Signed)
[facility] [place], [name], [address] Phone: [phone]   Fax:  [phone]  Physical Therapy Evaluation  Patient Details  Name: Tonya Chandler MRN: [id number] Date of Birth: [birth year] Referring Provider (PT): Frederik Pear, MD   Encounter Date: 12/01/2020   PT End of Session - 12/01/20 0933     Visit Number 1    Number of Visits 12    Date for PT Re-Evaluation 01/12/21    Authorization Type [employer]    PT Start Time 0845    PT Stop Time 0925    PT Time Calculation (min) 40 min    Activity Tolerance Patient tolerated treatment well    Behavior During Therapy [facility] for tasks assessed/performed             Past Medical History:  Diagnosis Date   Abnormal ECG 04/01/2018   Abnormal stress echocardiogram 05/24/2019   Formatting of this note might be different from the original. Added automatically from request for surgery [phone]   Abnormal stress test 05/24/2019   Formatting of this note might be different from the original. Added automatically from request for surgery [id number]   Anemia    Angina pectoris ([place]) 06/04/2019   Anxiety    Atherosclerosis of native coronary artery with angina pectoris ([place]) 05/10/2015   Formatting of this note might be different from the original. 30% lesion-Chiu   Bilateral carpal tunnel syndrome 05/23/2017   Bilateral knee pain 02/16/2010   Qualifier: Diagnosis of  By: Koleen Nimrod MD, Jeffrey     Carpal tunnel syndrome, right 12/16/2017   Colon polyps    Depression    DEPRESSION 02/16/2010   Qualifier: Diagnosis of  By: Koleen Nimrod MD, Dellis Filbert     Depression, major, in partial remission ([place]) 07/14/2015   Diabetes mellitus    Diverticulosis    Dupuytren's contracture of right hand 06/24/2017   Essential hypertension 02/16/2010   Qualifier: Diagnosis of  By: Koleen Nimrod MD, Dellis Filbert     Gastro-esophageal reflux    Gastrointestinal ulcer    Generalized anxiety disorder 03/14/2015   GERD  02/16/2010   Qualifier: Diagnosis of  By: Koleen Nimrod MD, Dellis Filbert     H/O: GI bleed    Said it was due to taking metformin   Hyperlipidemia    Hyperlipidemia LDL goal <70 02/16/2010   Qualifier: Diagnosis of  By: Koleen Nimrod MD, Dellis Filbert     Hypertension    IBS (irritable bowel syndrome)    Impingement syndrome of shoulder, right 06/24/2017   Midline low back pain with left-sided sciatica 12/29/2013   Musculoskeletal chest pain 05/04/2015   Obesity    Post-nasal drainage 02/10/2020   Last Assessment & Plan:  Formatting of this note might be different from the original. Concern over postnasal drainage. Throat awareness symptoms ever since she was packed in the right nostril last summer.  Was treated with antibiotics with some improvement.  Causes intermittent hoarseness and loss of voice.  Non-smoker. EXAM by anterior rhinoscopy shows no obvious polyps or purulence or obstructi   Precordial pain 05/04/2015   Primary insomnia 03/14/2015   Radiculitis of right cervical region 01/20/2018   Radiculopathy 04/02/2018   Right hand pain 06/24/2017   RLS (restless legs syndrome) 07/14/2015   Type 2 diabetes mellitus with diabetic neuropathy, with long-term current use of insulin ([place]) 02/16/2010   Qualifier: Diagnosis of  By: Koleen Nimrod MD, Jeffrey     Unstable angina [facility]) 06/11/2019    Past Surgical History:  Procedure Laterality  Date   ABDOMINAL HYSTERECTOMY     ANTERIOR CERVICAL DECOMP/DISCECTOMY FUSION N/A 04/02/2018   Procedure: ANTERIOR CERVICAL DECOMPRESSION FUSION, CERVICAL FOUR-FIVE, CERVICAL FIVE-SIX, WITH INSTRUMENTATION AND ALLOGRAFT;  Surgeon: Phylliss Bob, MD;  Location: [place];  Service: Orthopedics;  Laterality: N/A;   COLONOSCOPY WITH ESOPHAGOGASTRODUODENOSCOPY (EGD)  05/04/2009   CORONARY ARTERY BYPASS GRAFT N/A 06/14/2019   Procedure: CORONARY ARTERY BYPASS GRAFTING (CABG) using LIMA to LAD; Endoscopic Right Greater Saphenous Vein Harvest: SVG to Circ (distal); SVG to RCA (distal).;  Surgeon:  [name], MD;  Location: [place];  Service: Open Heart Surgery;  Laterality: N/A;   ENDOVEIN HARVEST OF GREATER SAPHENOUS VEIN Right 06/14/2019   Procedure: [place] Of Greater Saphenous Vein;  Surgeon: [name], MD;  Location: [place];  Service: Open Heart Surgery;  Laterality: Right;   LEFT HEART CATH AND CORONARY ANGIOGRAPHY N/A 06/11/2019   Procedure: LEFT HEART CATH AND CORONARY ANGIOGRAPHY;  Surgeon: Wellington Hampshire, MD;  Location: [place] [facility];  Service: Cardiovascular;  Laterality: N/A;   PIP JOINT FUSION Bilateral    pinky   TEE WITHOUT CARDIOVERSION N/A 06/14/2019   Procedure: TRANSESOPHAGEAL ECHOCARDIOGRAM (TEE);  Surgeon: [name], MD;  Location: [place];  Service: Open Heart Surgery;  Laterality: N/A;    There were no vitals filed for this visit.    Subjective Assessment - 12/01/20 0848     Subjective Pt states in June she injured her R knee and got PT. November 17, 2020 he repaired her medial and lateral meniscus. Pt reports a little pain and weakness. Pt states her biggest fear is walking down steps. No buckling noted since surgery.    Limitations Standing;House hold activities;Lifting    How long can you stand comfortably? No issues    How long can you walk comfortably? No issues    Patient Stated Goals Wants to walk up/down stairs normally    Currently in Pain? Yes    Pain Score 2     Pain Location Knee    Pain Orientation Right;Anterior;Mid    Pain Descriptors / Indicators Aching    Pain Type Acute pain    Pain Onset 1 to 4 weeks ago    Pain Frequency Constant    Aggravating Factors  Stairs    Pain Relieving Factors ice    Effect of Pain on Daily Activities Difficulty with home mobility                [facility] PT Assessment - 12/01/20 0001       Assessment   Medical Diagnosis Z98.890 (ICD-10-CM) - S/P right knee arthroscopy    Referring Provider (PT) Frederik Pear, MD    Hand Dominance Left    Prior Therapy Prior to  surgery      Precautions   Precautions None      Restrictions   Weight Bearing Restrictions No      Balance Screen   Has the patient fallen in the past 6 months No      [place] residence    Living Arrangements Spouse/significant other    Available Help at Discharge Family    Type of [place] to enter    [place] Two level    Alternate Level Stairs-Number of Steps a University None      Prior Function   Level of [place] Independent  Observation/Other Assessments   Focus on Therapeutic Outcomes (FOTO)  55 (risk adjusted 47), predicted 65      Functional Tests   Functional tests Sit to Stand      Sit to Stand   Comments decreased weightshift to right      ROM / Strength   AROM / PROM / Strength AROM;Strength      AROM   AROM Assessment Site Knee    Right/Left Knee Right;Left    Right Knee Extension -6    Right Knee Flexion 100    Left Knee Extension 3    Left Knee Flexion 123      Strength   Strength Assessment Site Hip;Knee    Right/Left Hip Left;Right    Right Hip Flexion 4-/5    Right Hip Extension 4+/5    Right Hip ABduction 4-/5    Left Hip Flexion 4+/5    Left Hip Extension 5/5    Left Hip ABduction 3+/5    Right/Left Knee Right;Left    Right Knee Flexion 4+/5    Right Knee Extension 4+/5    Left Knee Flexion 5/5    Left Knee Extension 5/5      Flexibility   Soft Tissue Assessment /Muscle Length yes    Hamstrings 130 deg on R; 150 deg on L    Quadriceps R = 92 deg      Palpation   Palpation comment TTP and large muscle knot in R hamstring and lateral/medial quad      Ambulation/Gait   Assistive device None    Gait Pattern Step-through pattern    Ambulation Surface Level;Indoor                        Objective measurements completed on examination: See above findings.       [place] Adult PT Treatment/Exercise - 12/01/20 0001        Exercises   Exercises Knee/Hip      Knee/Hip Exercises: Stretches   Active Hamstring Stretch 30 seconds    Quad Stretch 30 seconds      Knee/Hip Exercises: Standing   Wall Squat 10 reps    Wall Squat Limitations quarter squats      Knee/Hip Exercises: Supine   Straight Leg Raises 10 reps      Knee/Hip Exercises: Sidelying   Hip ABduction 10 reps      Knee/Hip Exercises: Prone   Hamstring Curl 10 reps      Manual Therapy   Manual Therapy Soft tissue mobilization    Soft tissue mobilization IASTM with massage roller along hamstring                     PT Education - 12/01/20 0933     Education Details Exam findings, HEP, POC    Person(s) Educated Patient    Methods Explanation;Demonstration;Tactile cues;Verbal cues;Handout    Comprehension Verbalized understanding;Returned demonstration;Verbal cues required;Tactile cues required                 PT Long Term Goals - 12/01/20 0943       PT LONG TERM GOAL #1   Title The patient will be indep with HEP progression.    Time 6    Period Weeks    Status New    Target Date 01/12/21      PT LONG TERM GOAL #2   Title Pt will be able to negotiate steps with reciprocal pattern no hand rail  Baseline Unable without pain    Time 6    Period Weeks    Status New    Target Date 01/12/21      PT LONG TERM GOAL #3   Title Pt will have improved FOTO score to at least 65    Baseline 55    Time 6    Period Weeks    Status New    Target Date 01/12/21      PT LONG TERM GOAL #4   Title Pt will have R = L knee ROM    Baseline R knee 6-100 deg    Time 6    Period Weeks    Status New    Target Date 01/12/21      PT LONG TERM GOAL #5   Title Pt will be able to perform a squat with no pain to demo improved functional strength    Time 6    Period Weeks    Status New    Target Date 01/12/21                    Plan - 12/01/20 0933     Clinical Impression Statement [name] is a 60 y/o F  presenting to OPPT s/p R knee arthroscopy. On assessment, pt demos decreased hamstring and quad lengthening with hip and knee weakness and instability noted on R affecting her home and community mobility. Pt would benefit from PT to address these issues for full return to PLOF.    Personal Factors and Comorbidities Age;Fitness;Time since onset of injury/illness/exacerbation;Past/Current Experience    Examination-Activity Limitations Locomotion Level;Squat;Stairs    Examination-Participation Restrictions Art gallery manager;Dorita Sciara    Stability/Clinical Decision Making Stable/Uncomplicated    Clinical Decision Making Low    Rehab Potential Good    PT Frequency 2x / week    PT Duration 6 weeks    PT Treatment/Interventions ADLs/Self Care Home Management;Aquatic Therapy;Cryotherapy;Electrical Stimulation;Iontophoresis 4mg /ml Dexamethasone;DME Instruction;Gait training;Stair training;Functional mobility training;Orthotic Fit/Training;Therapeutic activities;Therapeutic exercise;Balance training;Neuromuscular re-education;Patient/family education;Manual techniques;Passive range of motion;Dry needling;Taping;Vasopneumatic Device;Scar mobilization    PT Next Visit Plan Stretch and manual therapy as needed for hamstring and quad. Continue knee and hip strengthening. Initiate knee stabilization/balance exercises.    PT Home Exercise Plan Access Code: [email or web address]    Consulted and Agree with Plan of Care Patient             Patient will benefit from skilled therapeutic intervention in order to improve the following deficits and impairments:  Decreased range of motion, Difficulty walking, Increased fascial restricitons, Increased muscle spasms, Pain, Decreased balance, Hypomobility, Improper body mechanics, Impaired flexibility, Decreased mobility, Decreased strength, Increased edema, Postural dysfunction  Visit Diagnosis: Acute pain of right knee  Muscle weakness  (generalized)  Localized edema  Other abnormalities of gait and mobility     Problem List Patient Active Problem List   Diagnosis Date Noted   Acute meniscal tear of right knee 11/07/2020   Grief 11/07/2020   HSV-2 (herpes simplex virus 2) infection - genital, (+)viral culture 08/2020 09/11/2020   Obesity    IBS (irritable bowel syndrome)    Hypertension    Hyperlipidemia    H/O: GI bleed    Gastrointestinal ulcer    Gastro-esophageal reflux    Diverticulosis    Depression    Colon polyps    Anxiety    Anemia    Post-nasal drainage 02/10/2020   S/P CABG x 3 LIMA to LAD, SVG sequential to RCA and circumflex in  May 2021    Unstable angina ([place]) 06/11/2019   Angina pectoris ([place]) 06/04/2019   Abnormal stress test 05/24/2019   Abnormal stress echocardiogram 05/24/2019   Radiculopathy 04/02/2018   Abnormal ECG 04/01/2018   Radiculitis of right cervical region 01/20/2018   Carpal tunnel syndrome, right 12/16/2017   Right hand pain 06/24/2017   Impingement syndrome of shoulder, right 06/24/2017   Dupuytren's contracture of right hand 06/24/2017   Bilateral carpal tunnel syndrome 05/23/2017   Depression, major, in partial remission ([place]) 07/14/2015   RLS (restless legs syndrome) 07/14/2015   Atherosclerosis of native coronary artery with angina pectoris ([place]) 05/10/2015   Musculoskeletal chest pain 05/04/2015   Precordial pain 05/04/2015   Generalized anxiety disorder 03/14/2015   Primary insomnia 03/14/2015   Type 2 diabetes mellitus with diabetic neuropathy, with long-term current use of insulin ([place]) 02/16/2010   Hyperlipidemia LDL goal <70 02/16/2010   Essential hypertension 02/16/2010   GERD 02/16/2010   Bilateral knee pain 02/16/2010    [facility] April Gordy Levan, PT, DPT 12/01/2020, 9:53 AM  [facility] [employer] [address] [place], [name], [address] Phone: [phone]   Fax:  [phone]  Name: Tonya Chandler MRN: [id number] Date of Birth: Aug 22, 1960

## 2020-12-07 ENCOUNTER — Other Ambulatory Visit: Payer: Self-pay | Admitting: Osteopathic Medicine

## 2020-12-07 ENCOUNTER — Other Ambulatory Visit: Payer: Self-pay

## 2020-12-07 ENCOUNTER — Ambulatory Visit (INDEPENDENT_AMBULATORY_CARE_PROVIDER_SITE_OTHER): Payer: BC Managed Care – PPO | Admitting: Physical Therapy

## 2020-12-07 ENCOUNTER — Other Ambulatory Visit: Payer: Self-pay | Admitting: Cardiology

## 2020-12-07 DIAGNOSIS — Z794 Long term (current) use of insulin: Secondary | ICD-10-CM

## 2020-12-07 DIAGNOSIS — M25561 Pain in right knee: Secondary | ICD-10-CM

## 2020-12-07 DIAGNOSIS — R2689 Other abnormalities of gait and mobility: Secondary | ICD-10-CM | POA: Diagnosis not present

## 2020-12-07 DIAGNOSIS — R600 Localized edema: Secondary | ICD-10-CM

## 2020-12-07 DIAGNOSIS — M6281 Muscle weakness (generalized): Secondary | ICD-10-CM

## 2020-12-07 NOTE — Therapy (Signed)
[facility]  [place], [name], [address] Phone: [phone]   Fax:  [phone]  Physical Therapy Treatment  Patient Details  Name: Tonya Chandler MRN: [id number] Date of Birth: March 18, 1960 Referring Provider (PT): Frederik Pear, MD   Encounter Date: 12/07/2020   PT End of Session - 12/07/20 1020     Visit Number 2    Number of Visits 12    Date for PT Re-Evaluation 01/12/21    Authorization Type BCBS    PT Start Time 1019    PT Stop Time 1050    PT Time Calculation (min) 31 min    Activity Tolerance Patient tolerated treatment well    Behavior During Therapy [facility] for tasks assessed/performed             Past Medical History:  Diagnosis Date   Abnormal ECG 04/01/2018   Abnormal stress echocardiogram 05/24/2019   Formatting of this note might be different from the original. Added automatically from request for surgery [id number]   Abnormal stress test 05/24/2019   Formatting of this note might be different from the original. Added automatically from request for surgery [id number]   Anemia    Angina pectoris ([place]) 06/04/2019   Anxiety    Atherosclerosis of native coronary artery with angina pectoris ([place]) 05/10/2015   Formatting of this note might be different from the original. 30% lesion-Chiu   Bilateral carpal tunnel syndrome 05/23/2017   Bilateral knee pain 02/16/2010   Qualifier: Diagnosis of  By: Koleen Nimrod MD, Jeffrey     Carpal tunnel syndrome, right 12/16/2017   Colon polyps    Depression    DEPRESSION 02/16/2010   Qualifier: Diagnosis of  By: Koleen Nimrod MD, Dellis Filbert     Depression, major, in partial remission ([place]) 07/14/2015   Diabetes mellitus    Diverticulosis    Dupuytren's contracture of right hand 06/24/2017   Essential hypertension 02/16/2010   Qualifier: Diagnosis of  By: Koleen Nimrod MD, Dellis Filbert     Gastro-esophageal reflux    Gastrointestinal ulcer    Generalized anxiety disorder 03/14/2015   GERD  02/16/2010   Qualifier: Diagnosis of  By: Koleen Nimrod MD, Dellis Filbert     H/O: GI bleed    Said it was due to taking metformin   Hyperlipidemia    Hyperlipidemia LDL goal <70 02/16/2010   Qualifier: Diagnosis of  By: Koleen Nimrod MD, Dellis Filbert     Hypertension    IBS (irritable bowel syndrome)    Impingement syndrome of shoulder, right 06/24/2017   Midline low back pain with left-sided sciatica 12/29/2013   Musculoskeletal chest pain 05/04/2015   Obesity    Post-nasal drainage 02/10/2020   Last Assessment & Plan:  Formatting of this note might be different from the original. Concern over postnasal drainage. Throat awareness symptoms ever since she was packed in the right nostril last summer.  Was treated with antibiotics with some improvement.  Causes intermittent hoarseness and loss of voice.  Non-smoker. EXAM by anterior rhinoscopy shows no obvious polyps or purulence or obstructi   Precordial pain 05/04/2015   Primary insomnia 03/14/2015   Radiculitis of right cervical region 01/20/2018   Radiculopathy 04/02/2018   Right hand pain 06/24/2017   RLS (restless legs syndrome) 07/14/2015   Type 2 diabetes mellitus with diabetic neuropathy, with long-term current use of insulin ([place]) 02/16/2010   Qualifier: Diagnosis of  By: Koleen Nimrod MD, Jeffrey     Unstable angina [facility]) 06/11/2019    Past Surgical History:  Procedure Laterality  Date   ABDOMINAL HYSTERECTOMY     ANTERIOR CERVICAL DECOMP/DISCECTOMY FUSION N/A 04/02/2018   Procedure: ANTERIOR CERVICAL DECOMPRESSION FUSION, CERVICAL FOUR-FIVE, CERVICAL FIVE-SIX, WITH INSTRUMENTATION AND ALLOGRAFT;  Surgeon: Phylliss Bob, MD;  Location: [place];  Service: Orthopedics;  Laterality: N/A;   COLONOSCOPY WITH ESOPHAGOGASTRODUODENOSCOPY (EGD)  05/04/2009   CORONARY ARTERY BYPASS GRAFT N/A 06/14/2019   Procedure: CORONARY ARTERY BYPASS GRAFTING (CABG) using LIMA to LAD; Endoscopic Right Greater Saphenous Vein Harvest: SVG to Circ (distal); SVG to RCA (distal).;  Surgeon:  [name], MD;  Location: [place];  Service: Open Heart Surgery;  Laterality: N/A;   ENDOVEIN HARVEST OF GREATER SAPHENOUS VEIN Right 06/14/2019   Procedure: [place] Ropes Of Greater Saphenous Vein;  Surgeon: [name], MD;  Location: [place];  Service: Open Heart Surgery;  Laterality: Right;   LEFT HEART CATH AND CORONARY ANGIOGRAPHY N/A 06/11/2019   Procedure: LEFT HEART CATH AND CORONARY ANGIOGRAPHY;  Surgeon: Wellington Hampshire, MD;  Location: [place] CV LAB;  Service: Cardiovascular;  Laterality: N/A;   PIP JOINT FUSION Bilateral    pinky   TEE WITHOUT CARDIOVERSION N/A 06/14/2019   Procedure: TRANSESOPHAGEAL ECHOCARDIOGRAM (TEE);  Surgeon: [name], MD;  Location: [place];  Service: Open Heart Surgery;  Laterality: N/A;    There were no vitals filed for this visit.   Subjective Assessment - 12/07/20 1019     Subjective "I'm feeling much, much better".  Pt reports she is still hesitant to walk down stairs because it has buckled in the past.    Currently in Pain? Yes    Pain Score 2     Pain Location Knee    Pain Orientation Right;Anterior    Pain Descriptors / Indicators Dull;Aching    Aggravating Factors  pivot, side stepping, stairs.    Pain Relieving Factors ice                [facility] PT Assessment - 12/07/20 0001       Assessment   Medical Diagnosis Z98.890 (ICD-10-CM) - S/P right knee arthroscopy    Referring Provider (PT) Frederik Pear, MD    Hand Dominance Left    Prior Therapy Prior to surgery               [facility] Adult PT Treatment/Exercise - 12/07/20 0001       Knee/Hip Exercises: Stretches   Quad Stretch Right;3 reps;30 seconds   prone with strap   Gastroc Stretch Right;2 reps;Left;1 rep;20 seconds      Knee/Hip Exercises: Aerobic   Recumbent Bike L1 x 5 min for ROM      Knee/Hip Exercises: Standing   Forward Step Up Both;1 set;10 reps;Hand Hold: 2;Step Height: 4"    Step Down Both;1 set;10 reps;Hand Hold: 2;Step Height:  4"    Other Standing Knee Exercises side stepping R/L x 10 ft x 2 reps with increased step height.      Knee/Hip Exercises: Supine   Bridges Both;1 set;15 reps   5 sec hold   Straight Leg Raises Strengthening;Right;1 set;15 reps      Knee/Hip Exercises: Sidelying   Hip ABduction Strengthening;Right;1 set;15 reps   cues to roll forward and lead with heel.     Knee/Hip Exercises: Prone   Hamstring Curl 10 reps;1 set   RLE   Hip Extension Strengthening;Right;1 set;10 reps      Manual Therapy   Manual Therapy Taping    Kinesiotex Edema      Kinesiotix  Edema sensitive skin tape applied to Rt medial joint line to decompress tissue and assist with edema reduction.                          PT Long Term Goals - 12/01/20 0943       PT LONG TERM GOAL #1   Title The patient will be indep with HEP progression.    Time 6    Period Weeks    Status New    Target Date 01/12/21      PT LONG TERM GOAL #2   Title Pt will be able to negotiate steps with reciprocal pattern no hand rail    Baseline Unable without pain    Time 6    Period Weeks    Status New    Target Date 01/12/21      PT LONG TERM GOAL #3   Title Pt will have improved FOTO score to at least 65    Baseline 55    Time 6    Period Weeks    Status New    Target Date 01/12/21      PT LONG TERM GOAL #4   Title Pt will have R = L knee ROM    Baseline R knee 6-100 deg    Time 6    Period Weeks    Status New    Target Date 01/12/21      PT LONG TERM GOAL #5   Title Pt will be able to perform a squat with no pain to demo improved functional strength    Time 6    Period Weeks    Status New    Target Date 01/12/21                   Plan - 12/07/20 1056     Clinical Impression Statement Pt required pillow under knee when performing prone exercises. Otherwise she tolerated all exercises well.  No pain with 4" stairs or side stepping with increased step height.  Progressing towards goals.     Personal Factors and Comorbidities Age;Fitness;Time since onset of injury/illness/exacerbation;Past/Current Experience    Examination-Activity Limitations Locomotion Level;Squat;Stairs    Examination-Participation Restrictions Art gallery manager;Valla Leaver [facility]    Stability/Clinical Decision Making Stable/Uncomplicated    Rehab Potential Good    PT Frequency 2x / week    PT Duration 6 weeks    PT Treatment/Interventions ADLs/Self Care Home Management;Aquatic Therapy;Cryotherapy;Electrical Stimulation;Iontophoresis 4mg /ml Dexamethasone;DME Instruction;Gait training;Stair training;Functional mobility training;Orthotic Fit/Training;Therapeutic activities;Therapeutic exercise;Balance training;Neuromuscular re-education;Patient/family education;Manual techniques;Passive range of motion;Dry needling;Taping;Vasopneumatic Device;Scar mobilization    PT Next Visit Plan Stretch and manual therapy as needed for hamstring and quad. Continue knee and hip strengthening. Initiate knee stabilization/balance exercises.    PT Home Exercise Plan Access Code: [email or web address]    Consulted and Agree with Plan of Care Patient             Patient will benefit from skilled therapeutic intervention in order to improve the following deficits and impairments:  Decreased range of motion, Difficulty walking, Increased fascial restricitons, Increased muscle spasms, Pain, Decreased balance, Hypomobility, Improper body mechanics, Impaired flexibility, Decreased mobility, Decreased strength, Increased edema, Postural dysfunction  Visit Diagnosis: Acute pain of right knee  Muscle weakness (generalized)  Localized edema  Other abnormalities of gait and mobility     Problem List Patient Active Problem List   Diagnosis Date Noted   Acute meniscal tear of right knee 11/07/2020   Grief 11/07/2020  HSV-2 (herpes simplex virus 2) infection - genital, (+)viral culture 08/2020 09/11/2020   Obesity    IBS (irritable  bowel syndrome)    Hypertension    Hyperlipidemia    H/O: GI bleed    Gastrointestinal ulcer    Gastro-esophageal reflux    Diverticulosis    Depression    Colon polyps    Anxiety    Anemia    Post-nasal drainage 02/10/2020   S/P CABG x 3 LIMA to LAD, SVG sequential to RCA and circumflex in May 2021    Unstable angina ([place]) 06/11/2019   Angina pectoris ([place]) 06/04/2019   Abnormal stress test 05/24/2019   Abnormal stress echocardiogram 05/24/2019   Radiculopathy 04/02/2018   Abnormal ECG 04/01/2018   Radiculitis of right cervical region 01/20/2018   Carpal tunnel syndrome, right 12/16/2017   Right hand pain 06/24/2017   Impingement syndrome of shoulder, right 06/24/2017   Dupuytren's contracture of right hand 06/24/2017   Bilateral carpal tunnel syndrome 05/23/2017   Depression, major, in partial remission ([place]) 07/14/2015   RLS (restless legs syndrome) 07/14/2015   Atherosclerosis of native coronary artery with angina pectoris ([place]) 05/10/2015   Musculoskeletal chest pain 05/04/2015   Precordial pain 05/04/2015   Generalized anxiety disorder 03/14/2015   Primary insomnia 03/14/2015   Type 2 diabetes mellitus with diabetic neuropathy, with long-term current use of insulin ([place]) 02/16/2010   Hyperlipidemia LDL goal <70 02/16/2010   Essential hypertension 02/16/2010   GERD 02/16/2010   Bilateral knee pain 02/16/2010   Kerin Perna, PTA 12/07/20 10:59 AM   [facility] [address] [place], [name], [address] Phone: [phone]   Fax:  [phone]  Name: Ruthella Kirchman MRN: [id number] Date of Birth: 01/07/1961

## 2020-12-09 ENCOUNTER — Other Ambulatory Visit: Payer: Self-pay | Admitting: Osteopathic Medicine

## 2020-12-09 DIAGNOSIS — Z794 Long term (current) use of insulin: Secondary | ICD-10-CM

## 2020-12-09 DIAGNOSIS — E1140 Type 2 diabetes mellitus with diabetic neuropathy, unspecified: Secondary | ICD-10-CM

## 2020-12-11 ENCOUNTER — Ambulatory Visit (INDEPENDENT_AMBULATORY_CARE_PROVIDER_SITE_OTHER): Payer: BC Managed Care – PPO | Admitting: Physical Therapy

## 2020-12-11 ENCOUNTER — Other Ambulatory Visit: Payer: Self-pay

## 2020-12-11 DIAGNOSIS — R2689 Other abnormalities of gait and mobility: Secondary | ICD-10-CM | POA: Diagnosis not present

## 2020-12-11 DIAGNOSIS — R600 Localized edema: Secondary | ICD-10-CM

## 2020-12-11 DIAGNOSIS — M25561 Pain in right knee: Secondary | ICD-10-CM

## 2020-12-11 DIAGNOSIS — M6281 Muscle weakness (generalized): Secondary | ICD-10-CM | POA: Diagnosis not present

## 2020-12-11 NOTE — Therapy (Signed)
[place], [name], [address] Phone: [phone]   Fax:  [phone]  Physical Therapy Treatment  Patient Details  Name: Tonya Chandler MRN: [id number] Date of Birth: 05-01-1960 Referring Provider (PT): Frederik Pear, MD   Encounter Date: 12/11/2020   PT End of Session - 12/11/20 1109     Visit Number 3    Number of Visits 12    Date for PT Re-Evaluation 01/12/21    Authorization Type BCBS    PT Start Time 1106    PT Stop Time 3710    PT Time Calculation (min) 39 min    Activity Tolerance Patient tolerated treatment well    Behavior During Therapy [facility] for tasks assessed/performed             Past Medical History:  Diagnosis Date   Abnormal ECG 04/01/2018   Abnormal stress echocardiogram 05/24/2019   Formatting of this note might be different from the original. Added automatically from request for surgery [id number]   Abnormal stress test 05/24/2019   Formatting of this note might be different from the original. Added automatically from request for surgery [id number]   Anemia    Angina pectoris ([place]) 06/04/2019   Anxiety    Atherosclerosis of native coronary artery with angina pectoris ([place]) 05/10/2015   Formatting of this note might be different from the original. 30% lesion-Chiu   Bilateral carpal tunnel syndrome 05/23/2017   Bilateral knee pain 02/16/2010   Qualifier: Diagnosis of  By: Koleen Nimrod MD, Jeffrey     Carpal tunnel syndrome, right 12/16/2017   Colon polyps    Depression    DEPRESSION 02/16/2010   Qualifier: Diagnosis of  By: Koleen Nimrod MD, Dellis Filbert     Depression, major, in partial remission ([place]) 07/14/2015   Diabetes mellitus    Diverticulosis    Dupuytren's contracture of right hand 06/24/2017   Essential hypertension 02/16/2010   Qualifier: Diagnosis of  By: Koleen Nimrod MD, Dellis Filbert     Gastro-esophageal reflux    Gastrointestinal ulcer    Generalized anxiety disorder 03/14/2015   GERD  02/16/2010   Qualifier: Diagnosis of  By: Koleen Nimrod MD, Dellis Filbert     H/O: GI bleed    Said it was due to taking metformin   Hyperlipidemia    Hyperlipidemia LDL goal <70 02/16/2010   Qualifier: Diagnosis of  By: Koleen Nimrod MD, Dellis Filbert     Hypertension    IBS (irritable bowel syndrome)    Impingement syndrome of shoulder, right 06/24/2017   Midline low back pain with left-sided sciatica 12/29/2013   Musculoskeletal chest pain 05/04/2015   Obesity    Post-nasal drainage 02/10/2020   Last Assessment & Plan:  Formatting of this note might be different from the original. Concern over postnasal drainage. Throat awareness symptoms ever since she was packed in the right nostril last summer.  Was treated with antibiotics with some improvement.  Causes intermittent hoarseness and loss of voice.  Non-smoker. EXAM by anterior rhinoscopy shows no obvious polyps or purulence or obstructi   Precordial pain 05/04/2015   Primary insomnia 03/14/2015   Radiculitis of right cervical region 01/20/2018   Radiculopathy 04/02/2018   Right hand pain 06/24/2017   RLS (restless legs syndrome) 07/14/2015   Type 2 diabetes mellitus with diabetic neuropathy, with long-term current use of insulin ([place]) 02/16/2010   Qualifier: Diagnosis of  By: Koleen Nimrod MD, Jeffrey     Unstable angina [facility]) 06/11/2019    Past Surgical History:  Procedure Laterality  Date   ABDOMINAL HYSTERECTOMY     ANTERIOR CERVICAL DECOMP/DISCECTOMY FUSION N/A 04/02/2018   Procedure: ANTERIOR CERVICAL DECOMPRESSION FUSION, CERVICAL FOUR-FIVE, CERVICAL FIVE-SIX, WITH INSTRUMENTATION AND ALLOGRAFT;  Surgeon: Phylliss Bob, MD;  Location: [place];  Service: Orthopedics;  Laterality: N/A;   COLONOSCOPY WITH ESOPHAGOGASTRODUODENOSCOPY (EGD)  05/04/2009   CORONARY ARTERY BYPASS GRAFT N/A 06/14/2019   Procedure: CORONARY ARTERY BYPASS GRAFTING (CABG) using LIMA to LAD; Endoscopic Right Greater Saphenous Vein Harvest: SVG to Circ (distal); SVG to RCA (distal).;  Surgeon:  [name], MD;  Location: [place];  Service: Open Heart Surgery;  Laterality: N/A;   ENDOVEIN HARVEST OF GREATER SAPHENOUS VEIN Right 06/14/2019   Procedure: [place] Ropes Of Greater Saphenous Vein;  Surgeon: [name], MD;  Location: [place];  Service: Open Heart Surgery;  Laterality: Right;   LEFT HEART CATH AND CORONARY ANGIOGRAPHY N/A 06/11/2019   Procedure: LEFT HEART CATH AND CORONARY ANGIOGRAPHY;  Surgeon: Wellington Hampshire, MD;  Location: [place] CV LAB;  Service: Cardiovascular;  Laterality: N/A;   PIP JOINT FUSION Bilateral    pinky   TEE WITHOUT CARDIOVERSION N/A 06/14/2019   Procedure: TRANSESOPHAGEAL ECHOCARDIOGRAM (TEE);  Surgeon: [name], MD;  Location: [place];  Service: Open Heart Surgery;  Laterality: N/A;    There were no vitals filed for this visit.   Subjective Assessment - 12/11/20 1107     Subjective Pt following up with doctor Mayer Camel. Pt tried walking down steps this weekend and felt weakness in the back of her knee.    Limitations Standing;House hold activities;Lifting    How long can you stand comfortably? No issues    How long can you walk comfortably? No issues    Patient Stated Goals Wants to walk up/down stairs normally    Currently in Pain? Yes    Pain Score 1                 [facility] PT Assessment - 12/11/20 0001       Assessment   Medical Diagnosis Z98.890 (ICD-10-CM) - S/P right knee arthroscopy    Referring Provider (PT) Frederik Pear, MD    Hand Dominance Left                           [facility] Adult PT Treatment/Exercise - 12/11/20 0001       Knee/Hip Exercises: Aerobic   Tread Mill 5 min @ 1.5 mph      Knee/Hip Exercises: Standing   Other Standing Knee Exercises Lateral band walk red tband 2x10    Other Standing Knee Exercises Lateral step down 2x10      Knee/Hip Exercises: Prone   Hamstring Curl 10 reps;1 set    Hamstring Curl Limitations 10 reps of AROM; 10 reps with red tband    Hip  Extension Strengthening;Right;1 set;10 reps    Hip Extension Limitations red tband      Manual Therapy   Manual Therapy Soft tissue mobilization    Soft tissue mobilization IASTM with massage roller along hamstring; STM and TPR of biceps femoris and semitendinosus and semimembranosus                     PT Education - 12/11/20 1152     Education Details Discussed IASTM, heat and stretching for her R hamstrings.    Person(s) Educated Patient    Methods Explanation;Demonstration;Tactile cues;Verbal cues    Comprehension Verbalized understanding;Returned demonstration;Verbal  cues required                 PT Long Term Goals - 12/01/20 0943       PT LONG TERM GOAL #1   Title The patient will be indep with HEP progression.    Time 6    Period Weeks    Status New    Target Date 01/12/21      PT LONG TERM GOAL #2   Title Pt will be able to negotiate steps with reciprocal pattern no hand rail    Baseline Unable without pain    Time 6    Period Weeks    Status New    Target Date 01/12/21      PT LONG TERM GOAL #3   Title Pt will have improved FOTO score to at least 65    Baseline 55    Time 6    Period Weeks    Status New    Target Date 01/12/21      PT LONG TERM GOAL #4   Title Pt will have R = L knee ROM    Baseline R knee 6-100 deg    Time 6    Period Weeks    Status New    Target Date 01/12/21      PT LONG TERM GOAL #5   Title Pt will be able to perform a squat with no pain to demo improved functional strength    Time 6    Period Weeks    Status New    Target Date 01/12/21                   Plan - 12/11/20 1137     Clinical Impression Statement Pt reports posterior knee pain while performing steps this weekend. Found pt to have multiple trigger points and very tight R hamstring -- addressed with manual therapy and stretching. Improved smoothness of motion noted after. Updated pt's HEP to include resistance. Pt able to tolerate well.     Personal Factors and Comorbidities Age;Fitness;Time since onset of injury/illness/exacerbation;Past/Current Experience    Examination-Activity Limitations Locomotion Level;Squat;Stairs    Examination-Participation Restrictions Art gallery manager;Valla Leaver [facility]    Stability/Clinical Decision Making Stable/Uncomplicated    Rehab Potential Good    PT Frequency 2x / week    PT Duration 6 weeks    PT Treatment/Interventions ADLs/Self Care Home Management;Aquatic Therapy;Cryotherapy;Electrical Stimulation;Iontophoresis 4mg /ml Dexamethasone;DME Instruction;Gait training;Stair training;Functional mobility training;Orthotic Fit/Training;Therapeutic activities;Therapeutic exercise;Balance training;Neuromuscular re-education;Patient/family education;Manual techniques;Passive range of motion;Dry needling;Taping;Vasopneumatic Device;Scar mobilization    PT Next Visit Plan Stretch and manual therapy as needed for hamstring and quad trigger points. Continue knee and hip strengthening. Initiate knee stabilization/balance exercises.    PT Home Exercise Plan Access Code: [email or web address]    Consulted and Agree with Plan of Care Patient             Patient will benefit from skilled therapeutic intervention in order to improve the following deficits and impairments:  Decreased range of motion, Difficulty walking, Increased fascial restricitons, Increased muscle spasms, Pain, Decreased balance, Hypomobility, Improper body mechanics, Impaired flexibility, Decreased mobility, Decreased strength, Increased edema, Postural dysfunction  Visit Diagnosis: Acute pain of right knee  Muscle weakness (generalized)  Localized edema  Other abnormalities of gait and mobility     Problem List Patient Active Problem List   Diagnosis Date Noted   Acute meniscal tear of right knee 11/07/2020   Grief 11/07/2020   HSV-2 (herpes simplex virus 2) infection -  genital, (+)viral culture 08/2020 09/11/2020   Obesity     IBS (irritable bowel syndrome)    Hypertension    Hyperlipidemia    H/O: GI bleed    Gastrointestinal ulcer    Gastro-esophageal reflux    Diverticulosis    Depression    Colon polyps    Anxiety    Anemia    Post-nasal drainage 02/10/2020   S/P CABG x 3 LIMA to LAD, SVG sequential to RCA and circumflex in May 2021    Unstable angina ([place]) 06/11/2019   Angina pectoris ([place]) 06/04/2019   Abnormal stress test 05/24/2019   Abnormal stress echocardiogram 05/24/2019   Radiculopathy 04/02/2018   Abnormal ECG 04/01/2018   Radiculitis of right cervical region 01/20/2018   Carpal tunnel syndrome, right 12/16/2017   Right hand pain 06/24/2017   Impingement syndrome of shoulder, right 06/24/2017   Dupuytren's contracture of right hand 06/24/2017   Bilateral carpal tunnel syndrome 05/23/2017   Depression, major, in partial remission ([place]) 07/14/2015   RLS (restless legs syndrome) 07/14/2015   Atherosclerosis of native coronary artery with angina pectoris ([place]) 05/10/2015   Musculoskeletal chest pain 05/04/2015   Precordial pain 05/04/2015   Generalized anxiety disorder 03/14/2015   Primary insomnia 03/14/2015   Type 2 diabetes mellitus with diabetic neuropathy, with long-term current use of insulin ([place]) 02/16/2010   Hyperlipidemia LDL goal <70 02/16/2010   Essential hypertension 02/16/2010   GERD 02/16/2010   Bilateral knee pain 02/16/2010    [facility] April Ma L Crandall Harvel, PT, DPT 12/11/2020, 11:52 AM  [facility] [address] [place], [name], [address] Phone: [phone]   Fax:  [phone]  Name: Tonya Chandler MRN: [id number] Date of Birth: [birth year]

## 2020-12-13 ENCOUNTER — Other Ambulatory Visit: Payer: Self-pay

## 2020-12-13 ENCOUNTER — Ambulatory Visit (INDEPENDENT_AMBULATORY_CARE_PROVIDER_SITE_OTHER): Payer: BC Managed Care – PPO | Admitting: Physical Therapy

## 2020-12-13 DIAGNOSIS — M6281 Muscle weakness (generalized): Secondary | ICD-10-CM

## 2020-12-13 DIAGNOSIS — M25561 Pain in right knee: Secondary | ICD-10-CM | POA: Diagnosis not present

## 2020-12-13 DIAGNOSIS — R600 Localized edema: Secondary | ICD-10-CM | POA: Diagnosis not present

## 2020-12-13 DIAGNOSIS — R2689 Other abnormalities of gait and mobility: Secondary | ICD-10-CM | POA: Diagnosis not present

## 2020-12-13 NOTE — Therapy (Signed)
[place] [facility] [place], [name], [address] Phone: [phone]   Fax:  [phone]  Physical Therapy Treatment  Patient Details  Name: Tonya Chandler MRN: [id number] Date of Birth: [birth year] Referring Provider (PT): Frederik Pear, MD   Encounter Date: 12/13/2020   PT End of Session - 12/13/20 1020     Visit Number 4    Number of Visits 12    Date for PT Re-Evaluation 01/12/21    Authorization Type BCBS    PT Start Time 1018    PT Stop Time 1107   MHP/ice last 10 min of session   PT Time Calculation (min) 49 min    Activity Tolerance Patient tolerated treatment well    Behavior During Therapy [facility] for tasks assessed/performed             Past Medical History:  Diagnosis Date   Abnormal ECG 04/01/2018   Abnormal stress echocardiogram 05/24/2019   Formatting of this note might be different from the original. Added automatically from request for surgery [id number]   Abnormal stress test 05/24/2019   Formatting of this note might be different from the original. Added automatically from request for surgery [id number]   Anemia    Angina pectoris ([place]) 06/04/2019   Anxiety    Atherosclerosis of native coronary artery with angina pectoris ([place]) 05/10/2015   Formatting of this note might be different from the original. 30% lesion-Chiu   Bilateral carpal tunnel syndrome 05/23/2017   Bilateral knee pain 02/16/2010   Qualifier: Diagnosis of  By: Koleen Nimrod MD, Jeffrey     Carpal tunnel syndrome, right 12/16/2017   Colon polyps    Depression    DEPRESSION 02/16/2010   Qualifier: Diagnosis of  By: Koleen Nimrod MD, Dellis Filbert     Depression, major, in partial remission ([place]) 07/14/2015   Diabetes mellitus    Diverticulosis    Dupuytren's contracture of right hand 06/24/2017   Essential hypertension 02/16/2010   Qualifier: Diagnosis of  By: Koleen Nimrod MD, Dellis Filbert     Gastro-esophageal reflux    Gastrointestinal ulcer    Generalized anxiety  disorder 03/14/2015   GERD 02/16/2010   Qualifier: Diagnosis of  By: Koleen Nimrod MD, Dellis Filbert     H/O: GI bleed    Said it was due to taking metformin   Hyperlipidemia    Hyperlipidemia LDL goal <70 02/16/2010   Qualifier: Diagnosis of  By: Koleen Nimrod MD, Dellis Filbert     Hypertension    IBS (irritable bowel syndrome)    Impingement syndrome of shoulder, right 06/24/2017   Midline low back pain with left-sided sciatica 12/29/2013   Musculoskeletal chest pain 05/04/2015   Obesity    Post-nasal drainage 02/10/2020   Last Assessment & Plan:  Formatting of this note might be different from the original. Concern over postnasal drainage. Throat awareness symptoms ever since she was packed in the right nostril last summer.  Was treated with antibiotics with some improvement.  Causes intermittent hoarseness and loss of voice.  Non-smoker. EXAM by anterior rhinoscopy shows no obvious polyps or purulence or obstructi   Precordial pain 05/04/2015   Primary insomnia 03/14/2015   Radiculitis of right cervical region 01/20/2018   Radiculopathy 04/02/2018   Right hand pain 06/24/2017   RLS (restless legs syndrome) 07/14/2015   Type 2 diabetes mellitus with diabetic neuropathy, with long-term current use of insulin ([place]) 02/16/2010   Qualifier: Diagnosis of  By: Koleen Nimrod MD, Jeffrey     Unstable angina [facility]) 06/11/2019  Past Surgical History:  Procedure Laterality Date   ABDOMINAL HYSTERECTOMY     ANTERIOR CERVICAL DECOMP/DISCECTOMY FUSION N/A 04/02/2018   Procedure: ANTERIOR CERVICAL DECOMPRESSION FUSION, CERVICAL FOUR-FIVE, CERVICAL FIVE-SIX, WITH INSTRUMENTATION AND ALLOGRAFT;  Surgeon: Phylliss Bob, MD;  Location: [place];  Service: Orthopedics;  Laterality: N/A;   COLONOSCOPY WITH ESOPHAGOGASTRODUODENOSCOPY (EGD)  05/04/2009   CORONARY ARTERY BYPASS GRAFT N/A 06/14/2019   Procedure: CORONARY ARTERY BYPASS GRAFTING (CABG) using LIMA to LAD; Endoscopic Right Greater Saphenous Vein Harvest: SVG to Circ (distal); SVG to  RCA (distal).;  Surgeon: [name], MD;  Location: [place];  Service: Open Heart Surgery;  Laterality: N/A;   ENDOVEIN HARVEST OF GREATER SAPHENOUS VEIN Right 06/14/2019   Procedure: [place] Ropes Of Greater Saphenous Vein;  Surgeon: [name], MD;  Location: [place];  Service: Open Heart Surgery;  Laterality: Right;   LEFT HEART CATH AND CORONARY ANGIOGRAPHY N/A 06/11/2019   Procedure: LEFT HEART CATH AND CORONARY ANGIOGRAPHY;  Surgeon: Wellington Hampshire, MD;  Location: [place] CV LAB;  Service: Cardiovascular;  Laterality: N/A;   PIP JOINT FUSION Bilateral    pinky   TEE WITHOUT CARDIOVERSION N/A 06/14/2019   Procedure: TRANSESOPHAGEAL ECHOCARDIOGRAM (TEE);  Surgeon: [name], MD;  Location: [place];  Service: Open Heart Surgery;  Laterality: N/A;    There were no vitals filed for this visit.   Subjective Assessment - 12/13/20 1021     Subjective Pt reports continued pain with side stepping with/without band.  She reports continued tightness in the posterior RLE. She is using rolling pin to area daily, with some relief.  She had follow up with surgeon; she states he advised her to keep working in PT and return to him in 4 wks.    Currently in Pain? Yes    Pain Score 2     Pain Location Knee    Pain Orientation Right;Medial;Lateral    Pain Descriptors / Indicators Aching    Aggravating Factors  side stepping, stairs    Pain Relieving Factors ice                [facility] PT Assessment - 12/13/20 0001       Assessment   Medical Diagnosis Z98.890 (ICD-10-CM) - S/P right knee arthroscopy    Referring Provider (PT) Frederik Pear, MD    Hand Dominance Left      Flexibility   Quadriceps Rt: 104              [place] Adult PT Treatment/Exercise - 12/13/20 0001       Knee/Hip Exercises: Stretches   Quad Stretch Right;3 reps;30 seconds   prone   Gastroc Stretch Right;2 reps;20 seconds    Soleus Stretch Right;2 reps;20 seconds      Knee/Hip Exercises:  Aerobic   Recumbent Bike L2: 4 min for warm up    Nustep L4: 31min - switched to bicycle with improved tolerance.      Knee/Hip Exercises: Standing   Lateral Step Up Right;2 sets;10 reps    Lateral Step Up Limitations some episodes of catching during 2nd set when changing to heel tap instead of lateral step ups. stopped and stretched quad.      Knee/Hip Exercises: Prone   Hamstring Curl 10 reps;1 set   red band at ankles.  - each leg   Hip Extension Strengthening;Right;Left;1 set;10 reps    Hip Extension Limitations red band at ankles.      Modalities   Modalities Moist  Heat;Cryotherapy      Moist Heat Therapy   Number Minutes Moist Heat 10 Minutes    Moist Heat Location Knee   posterior Rt LE     Cryotherapy   Number Minutes Cryotherapy 10 Minutes    Cryotherapy Location Knee   Rt ant   Type of Cryotherapy Ice pack      Vasopneumatic   Vasopnuematic Location  --    Vasopneumatic Pressure --    Vasopneumatic Temperature  --      Manual Therapy   Manual therapy comments skilled palpation to assess response to STM and DN    Soft tissue mobilization STM to Rt hamstring to decrease fascial restrictions and improve mobility.              Trigger Point Dry Needling - 12/13/20 0001     Consent Given? Yes    Education Handout Provided Previously provided    Hamstring Response Twitch response elicited;Palpable increased muscle length    Gastrocnemius Response Twitch response elicited;Palpable increased muscle length               PT Long Term Goals - 12/01/20 0943       PT LONG TERM GOAL #1   Title The patient will be indep with HEP progression.    Time 6    Period Weeks    Status New    Target Date 01/12/21      PT LONG TERM GOAL #2   Title Pt will be able to negotiate steps with reciprocal pattern no hand rail    Baseline Unable without pain    Time 6    Period Weeks    Status New    Target Date 01/12/21      PT LONG TERM GOAL #3   Title Pt will have  improved FOTO score to at least 65    Baseline 55    Time 6    Period Weeks    Status New    Target Date 01/12/21      PT LONG TERM GOAL #4   Title Pt will have R = L knee ROM    Baseline R knee 6-100 deg    Time 6    Period Weeks    Status New    Target Date 01/12/21      PT LONG TERM GOAL #5   Title Pt will be able to perform a squat with no pain to demo improved functional strength    Time 6    Period Weeks    Status New    Target Date 01/12/21                   Plan - 12/13/20 1050     Clinical Impression Statement Continued tightness in Rt hamstring and calf; responded well to STM and DN (provided by supervising PT, Lorna Few).  Encouraged pt to be mindful of form with lateral moving exercises and to focus on stretching Rt posterior LE.  Pt progressing well towards LTGs.    Personal Factors and Comorbidities Age;Fitness;Time since onset of injury/illness/exacerbation;Past/Current Experience    Examination-Activity Limitations Locomotion Level;Squat;Stairs    Examination-Participation Restrictions Art gallery manager;Valla Leaver [facility]    Stability/Clinical Decision Making Stable/Uncomplicated    Rehab Potential Good    PT Frequency 2x / week    PT Duration 6 weeks    PT Treatment/Interventions ADLs/Self Care Home Management;Aquatic Therapy;Cryotherapy;Electrical Stimulation;Iontophoresis 4mg /ml Dexamethasone;DME Instruction;Gait training;Stair training;Functional mobility training;Orthotic Fit/Training;Therapeutic activities;Therapeutic exercise;Balance training;Neuromuscular re-education;Patient/family education;Manual  techniques;Passive range of motion;Dry needling;Taping;Vasopneumatic Device;Scar mobilization    PT Next Visit Plan Stretch and manual therapy as needed for hamstring and quad trigger points. Continue knee and hip strengthening. Initiate knee stabilization/balance exercises.    PT Home Exercise Plan Access Code: [email or web address]    Consulted  and Agree with Plan of Care Patient             Patient will benefit from skilled therapeutic intervention in order to improve the following deficits and impairments:  Decreased range of motion, Difficulty walking, Increased fascial restricitons, Increased muscle spasms, Pain, Decreased balance, Hypomobility, Improper body mechanics, Impaired flexibility, Decreased mobility, Decreased strength, Increased edema, Postural dysfunction  Visit Diagnosis: Acute pain of right knee  Muscle weakness (generalized)  Localized edema  Other abnormalities of gait and mobility     Problem List Patient Active Problem List   Diagnosis Date Noted   Acute meniscal tear of right knee 11/07/2020   Grief 11/07/2020   HSV-2 (herpes simplex virus 2) infection - genital, (+)viral culture 08/2020 09/11/2020   Obesity    IBS (irritable bowel syndrome)    Hypertension    Hyperlipidemia    H/O: GI bleed    Gastrointestinal ulcer    Gastro-esophageal reflux    Diverticulosis    Depression    Colon polyps    Anxiety    Anemia    Post-nasal drainage 02/10/2020   S/P CABG x 3 LIMA to LAD, SVG sequential to RCA and circumflex in May 2021    Unstable angina ([place]) 06/11/2019   Angina pectoris ([place]) 06/04/2019   Abnormal stress test 05/24/2019   Abnormal stress echocardiogram 05/24/2019   Radiculopathy 04/02/2018   Abnormal ECG 04/01/2018   Radiculitis of right cervical region 01/20/2018   Carpal tunnel syndrome, right 12/16/2017   Right hand pain 06/24/2017   Impingement syndrome of shoulder, right 06/24/2017   Dupuytren's contracture of right hand 06/24/2017   Bilateral carpal tunnel syndrome 05/23/2017   Depression, major, in partial remission ([place]) 07/14/2015   RLS (restless legs syndrome) 07/14/2015   Atherosclerosis of native coronary artery with angina pectoris ([place]) 05/10/2015   Musculoskeletal chest pain 05/04/2015   Precordial pain 05/04/2015   Generalized anxiety disorder 03/14/2015    Primary insomnia 03/14/2015   Type 2 diabetes mellitus with diabetic neuropathy, with long-term current use of insulin ([place]) 02/16/2010   Hyperlipidemia LDL goal <70 02/16/2010   Essential hypertension 02/16/2010   GERD 02/16/2010   Bilateral knee pain 02/16/2010  Tonya Chandler, PT  Kerin Perna, PTA 12/13/20 11:03 AM  [facility] [address] [place], [name], [address] Phone: [phone]   Fax:  [phone]  Name: Mayanna Garlitz MRN: [id number] Date of Birth: [birth year]

## 2020-12-18 ENCOUNTER — Ambulatory Visit (INDEPENDENT_AMBULATORY_CARE_PROVIDER_SITE_OTHER): Payer: BC Managed Care – PPO | Admitting: Physical Therapy

## 2020-12-18 ENCOUNTER — Other Ambulatory Visit: Payer: Self-pay

## 2020-12-18 DIAGNOSIS — R600 Localized edema: Secondary | ICD-10-CM

## 2020-12-18 DIAGNOSIS — M25561 Pain in right knee: Secondary | ICD-10-CM | POA: Diagnosis not present

## 2020-12-18 DIAGNOSIS — R2689 Other abnormalities of gait and mobility: Secondary | ICD-10-CM

## 2020-12-18 DIAGNOSIS — M6281 Muscle weakness (generalized): Secondary | ICD-10-CM | POA: Diagnosis not present

## 2020-12-18 NOTE — Therapy (Signed)
[place], [name], [address] Phone: [phone]   Fax:  [phone]  Physical Therapy Treatment  Patient Details  Name: Tonya Chandler MRN: [id number] Date of Birth: 11-Aug-1960 Referring Provider (PT): Frederik Pear, MD   Encounter Date: 12/18/2020   PT End of Session - 12/18/20 1147     Visit Number 5    Number of Visits 12    Date for PT Re-Evaluation 01/12/21    Authorization Type BCBS    PT Start Time 7591    PT Stop Time 1230    PT Time Calculation (min) 45 min    Activity Tolerance Patient tolerated treatment well    Behavior During Therapy [facility] for tasks assessed/performed             Past Medical History:  Diagnosis Date   Abnormal ECG 04/01/2018   Abnormal stress echocardiogram 05/24/2019   Formatting of this note might be different from the original. Added automatically from request for surgery [id number]   Abnormal stress test 05/24/2019   Formatting of this note might be different from the original. Added automatically from request for surgery [id number]   Anemia    Angina pectoris ([place]) 06/04/2019   Anxiety    Atherosclerosis of native coronary artery with angina pectoris ([place]) 05/10/2015   Formatting of this note might be different from the original. 30% lesion-Chiu   Bilateral carpal tunnel syndrome 05/23/2017   Bilateral knee pain 02/16/2010   Qualifier: Diagnosis of  By: Koleen Nimrod MD, Jeffrey     Carpal tunnel syndrome, right 12/16/2017   Colon polyps    Depression    DEPRESSION 02/16/2010   Qualifier: Diagnosis of  By: Koleen Nimrod MD, Dellis Filbert     Depression, major, in partial remission ([place]) 07/14/2015   Diabetes mellitus    Diverticulosis    Dupuytren's contracture of right hand 06/24/2017   Essential hypertension 02/16/2010   Qualifier: Diagnosis of  By: Koleen Nimrod MD, Dellis Filbert     Gastro-esophageal reflux    Gastrointestinal ulcer    Generalized anxiety disorder 03/14/2015   GERD  02/16/2010   Qualifier: Diagnosis of  By: Koleen Nimrod MD, Dellis Filbert     H/O: GI bleed    Said it was due to taking metformin   Hyperlipidemia    Hyperlipidemia LDL goal <70 02/16/2010   Qualifier: Diagnosis of  By: Koleen Nimrod MD, Dellis Filbert     Hypertension    IBS (irritable bowel syndrome)    Impingement syndrome of shoulder, right 06/24/2017   Midline low back pain with left-sided sciatica 12/29/2013   Musculoskeletal chest pain 05/04/2015   Obesity    Post-nasal drainage 02/10/2020   Last Assessment & Plan:  Formatting of this note might be different from the original. Concern over postnasal drainage. Throat awareness symptoms ever since she was packed in the right nostril last summer.  Was treated with antibiotics with some improvement.  Causes intermittent hoarseness and loss of voice.  Non-smoker. EXAM by anterior rhinoscopy shows no obvious polyps or purulence or obstructi   Precordial pain 05/04/2015   Primary insomnia 03/14/2015   Radiculitis of right cervical region 01/20/2018   Radiculopathy 04/02/2018   Right hand pain 06/24/2017   RLS (restless legs syndrome) 07/14/2015   Type 2 diabetes mellitus with diabetic neuropathy, with long-term current use of insulin ([place]) 02/16/2010   Qualifier: Diagnosis of  By: Koleen Nimrod MD, Jeffrey     Unstable angina [facility]) 06/11/2019    Past Surgical History:  Procedure Laterality  Date   ABDOMINAL HYSTERECTOMY     ANTERIOR CERVICAL DECOMP/DISCECTOMY FUSION N/A 04/02/2018   Procedure: ANTERIOR CERVICAL DECOMPRESSION FUSION, CERVICAL FOUR-FIVE, CERVICAL FIVE-SIX, WITH INSTRUMENTATION AND ALLOGRAFT;  Surgeon: Phylliss Bob, MD;  Location: [place];  Service: Orthopedics;  Laterality: N/A;   COLONOSCOPY WITH ESOPHAGOGASTRODUODENOSCOPY (EGD)  05/04/2009   CORONARY ARTERY BYPASS GRAFT N/A 06/14/2019   Procedure: CORONARY ARTERY BYPASS GRAFTING (CABG) using LIMA to LAD; Endoscopic Right Greater Saphenous Vein Harvest: SVG to Circ (distal); SVG to RCA (distal).;  Surgeon:  [name], MD;  Location: [place];  Service: Open Heart Surgery;  Laterality: N/A;   ENDOVEIN HARVEST OF GREATER SAPHENOUS VEIN Right 06/14/2019   Procedure: [place] Ropes Of Greater Saphenous Vein;  Surgeon: [name], MD;  Location: [place];  Service: Open Heart Surgery;  Laterality: Right;   LEFT HEART CATH AND CORONARY ANGIOGRAPHY N/A 06/11/2019   Procedure: LEFT HEART CATH AND CORONARY ANGIOGRAPHY;  Surgeon: Wellington Hampshire, MD;  Location: [place] CV LAB;  Service: Cardiovascular;  Laterality: N/A;   PIP JOINT FUSION Bilateral    pinky   TEE WITHOUT CARDIOVERSION N/A 06/14/2019   Procedure: TRANSESOPHAGEAL ECHOCARDIOGRAM (TEE);  Surgeon: [name], MD;  Location: [place];  Service: Open Heart Surgery;  Laterality: N/A;    There were no vitals filed for this visit.   Subjective Assessment - 12/18/20 1148     Subjective Pt states that her knee has been doing well except that she planted her leg and twisted on it. She reports that she was able to go up/down the basement stairs.    Limitations Standing;House hold activities;Lifting    How long can you stand comfortably? No issues    How long can you walk comfortably? No issues    Patient Stated Goals Wants to walk up/down stairs normally    Currently in Pain? Yes    Pain Location Knee                               [facility] Adult PT Treatment/Exercise - 12/18/20 0001       Knee/Hip Exercises: Aerobic   Recumbent Bike L1 x 5 min      Knee/Hip Exercises: Machines for Strengthening   Cybex Knee Extension 2x10 DL @ 20#    Total Gym Leg Press 2x10 DL @ 40#      Knee/Hip Exercises: Standing   SLS with Vectors x10 forward, side, back and across standing on airex with R LE      Knee/Hip Exercises: Supine   Straight Leg Raises Strengthening;Right;2 sets;10 reps      Knee/Hip Exercises: Sidelying   Hip ABduction Strengthening;Right;2 sets;10 reps      Knee/Hip Exercises: Prone   Hamstring  Curl 10 reps;2 sets    Hip Extension Strengthening;Right;10 reps;2 sets    Hip Extension Limitations 2.5# ankle weight      Cryotherapy   Number Minutes Cryotherapy 10 Minutes    Cryotherapy Location Knee    Type of Cryotherapy Ice pack      Manual Therapy   Manual therapy comments skilled palpation to assess response to STM and DN    Soft tissue mobilization IASTM + STM to Rt hamstring to decrease fascial restrictions and improve mobility.              Trigger Point Dry Needling - 12/18/20 0001     Consent Given? Yes    Education  Handout Provided Previously provided    Hamstring Response Twitch response elicited;Palpable increased muscle length                        PT Long Term Goals - 12/01/20 0943       PT LONG TERM GOAL #1   Title The patient will be indep with HEP progression.    Time 6    Period Weeks    Status New    Target Date 01/12/21      PT LONG TERM GOAL #2   Title Pt will be able to negotiate steps with reciprocal pattern no hand rail    Baseline Unable without pain    Time 6    Period Weeks    Status New    Target Date 01/12/21      PT LONG TERM GOAL #3   Title Pt will have improved FOTO score to at least 65    Baseline 55    Time 6    Period Weeks    Status New    Target Date 01/12/21      PT LONG TERM GOAL #4   Title Pt will have R = L knee ROM    Baseline R knee 6-100 deg    Time 6    Period Weeks    Status New    Target Date 01/12/21      PT LONG TERM GOAL #5   Title Pt will be able to perform a squat with no pain to demo improved functional strength    Time 6    Period Weeks    Status New    Target Date 01/12/21                   Plan - 12/18/20 1155     Clinical Impression Statement Pt with improving R hamstring tightness -- pt reports it is less than last visit. Addressed with IASTM and DN as pt had a positive response. Continued to work on quad and hip strengthening. Initiated standing balance  exercises for stabilization.    Personal Factors and Comorbidities Age;Fitness;Time since onset of injury/illness/exacerbation;Past/Current Experience    Examination-Activity Limitations Locomotion Level;Squat;Stairs    Examination-Participation Restrictions Art gallery manager;Valla Leaver [facility]    Stability/Clinical Decision Making Stable/Uncomplicated    Rehab Potential Good    PT Frequency 2x / week    PT Duration 6 weeks    PT Treatment/Interventions ADLs/Self Care Home Management;Aquatic Therapy;Cryotherapy;Electrical Stimulation;Iontophoresis 4mg /ml Dexamethasone;DME Instruction;Gait training;Stair training;Functional mobility training;Orthotic Fit/Training;Therapeutic activities;Therapeutic exercise;Balance training;Neuromuscular re-education;Patient/family education;Manual techniques;Passive range of motion;Dry needling;Taping;Vasopneumatic Device;Scar mobilization    PT Next Visit Plan Stretch and manual therapy as needed for hamstring and gastroc trigger points. Continue knee and hip strengthening. Initiate knee stabilization/balance exercises.    PT Home Exercise Plan Access Code: [email or web address]    Consulted and Agree with Plan of Care Patient             Patient will benefit from skilled therapeutic intervention in order to improve the following deficits and impairments:  Decreased range of motion, Difficulty walking, Increased fascial restricitons, Increased muscle spasms, Pain, Decreased balance, Hypomobility, Improper body mechanics, Impaired flexibility, Decreased mobility, Decreased strength, Increased edema, Postural dysfunction  Visit Diagnosis: Acute pain of right knee  Muscle weakness (generalized)  Localized edema  Other abnormalities of gait and mobility     Problem List Patient Active Problem List   Diagnosis Date Noted   Acute meniscal tear of right knee  11/07/2020   Grief 11/07/2020   HSV-2 (herpes simplex virus 2) infection - genital, (+)viral  culture 08/2020 09/11/2020   Obesity    IBS (irritable bowel syndrome)    Hypertension    Hyperlipidemia    H/O: GI bleed    Gastrointestinal ulcer    Gastro-esophageal reflux    Diverticulosis    Depression    Colon polyps    Anxiety    Anemia    Post-nasal drainage 02/10/2020   S/P CABG x 3 LIMA to LAD, SVG sequential to RCA and circumflex in May 2021    Unstable angina ([place]) 06/11/2019   Angina pectoris ([place]) 06/04/2019   Abnormal stress test 05/24/2019   Abnormal stress echocardiogram 05/24/2019   Radiculopathy 04/02/2018   Abnormal ECG 04/01/2018   Radiculitis of right cervical region 01/20/2018   Carpal tunnel syndrome, right 12/16/2017   Right hand pain 06/24/2017   Impingement syndrome of shoulder, right 06/24/2017   Dupuytren's contracture of right hand 06/24/2017   Bilateral carpal tunnel syndrome 05/23/2017   Depression, major, in partial remission ([place]) 07/14/2015   RLS (restless legs syndrome) 07/14/2015   Atherosclerosis of native coronary artery with angina pectoris ([place]) 05/10/2015   Musculoskeletal chest pain 05/04/2015   Precordial pain 05/04/2015   Generalized anxiety disorder 03/14/2015   Primary insomnia 03/14/2015   Type 2 diabetes mellitus with diabetic neuropathy, with long-term current use of insulin ([place]) 02/16/2010   Hyperlipidemia LDL goal <70 02/16/2010   Essential hypertension 02/16/2010   GERD 02/16/2010   Bilateral knee pain 02/16/2010    [facility] April Gordy Levan, PT, DPT 12/18/2020, 12:41 PM  [facility] [place] [address] [place], [name], [address] Phone: [phone]   Fax:  [phone]  Name: Tonya Chandler MRN: [id number] Date of Birth: [birth year]

## 2020-12-19 DIAGNOSIS — M542 Cervicalgia: Secondary | ICD-10-CM | POA: Diagnosis not present

## 2020-12-19 DIAGNOSIS — M5412 Radiculopathy, cervical region: Secondary | ICD-10-CM | POA: Diagnosis not present

## 2020-12-21 ENCOUNTER — Ambulatory Visit (INDEPENDENT_AMBULATORY_CARE_PROVIDER_SITE_OTHER): Payer: BC Managed Care – PPO | Admitting: Physical Therapy

## 2020-12-21 ENCOUNTER — Other Ambulatory Visit: Payer: Self-pay

## 2020-12-21 DIAGNOSIS — M25561 Pain in right knee: Secondary | ICD-10-CM | POA: Diagnosis not present

## 2020-12-21 DIAGNOSIS — R2689 Other abnormalities of gait and mobility: Secondary | ICD-10-CM | POA: Diagnosis not present

## 2020-12-21 DIAGNOSIS — R600 Localized edema: Secondary | ICD-10-CM

## 2020-12-21 DIAGNOSIS — M6281 Muscle weakness (generalized): Secondary | ICD-10-CM | POA: Diagnosis not present

## 2020-12-21 NOTE — Therapy (Signed)
[place], [name], [address] Phone: [phone]   Fax:  [phone]  Physical Therapy Treatment  Patient Details  Name: Tonya Chandler MRN: [id number] Date of Birth: 10/11/1960 Referring Provider (PT): Frederik Pear, MD   Encounter Date: 12/21/2020   PT End of Session - 12/21/20 1522     Visit Number 6    Number of Visits 12    Date for PT Re-Evaluation 01/12/21    Authorization Type BCBS    PT Start Time 1525    PT Stop Time 2202    PT Time Calculation (min) 45 min    Activity Tolerance Patient tolerated treatment well    Behavior During Therapy [facility] for tasks assessed/performed             Past Medical History:  Diagnosis Date   Abnormal ECG 04/01/2018   Abnormal stress echocardiogram 05/24/2019   Formatting of this note might be different from the original. Added automatically from request for surgery [phone]   Abnormal stress test 05/24/2019   Formatting of this note might be different from the original. Added automatically from request for surgery [id number]   Anemia    Angina pectoris ([place]) 06/04/2019   Anxiety    Atherosclerosis of native coronary artery with angina pectoris ([place]) 05/10/2015   Formatting of this note might be different from the original. 30% lesion-Chiu   Bilateral carpal tunnel syndrome 05/23/2017   Bilateral knee pain 02/16/2010   Qualifier: Diagnosis of  By: Koleen Nimrod MD, Jeffrey     Carpal tunnel syndrome, right 12/16/2017   Colon polyps    Depression    DEPRESSION 02/16/2010   Qualifier: Diagnosis of  By: Koleen Nimrod MD, Dellis Filbert     Depression, major, in partial remission ([place]) 07/14/2015   Diabetes mellitus    Diverticulosis    Dupuytren's contracture of right hand 06/24/2017   Essential hypertension 02/16/2010   Qualifier: Diagnosis of  By: Koleen Nimrod MD, Dellis Filbert     Gastro-esophageal reflux    Gastrointestinal ulcer    Generalized anxiety disorder 03/14/2015   GERD 02/16/2010    Qualifier: Diagnosis of  By: Koleen Nimrod MD, Dellis Filbert     H/O: GI bleed    Said it was due to taking metformin   Hyperlipidemia    Hyperlipidemia LDL goal <70 02/16/2010   Qualifier: Diagnosis of  By: Koleen Nimrod MD, Dellis Filbert     Hypertension    IBS (irritable bowel syndrome)    Impingement syndrome of shoulder, right 06/24/2017   Midline low back pain with left-sided sciatica 12/29/2013   Musculoskeletal chest pain 05/04/2015   Obesity    Post-nasal drainage 02/10/2020   Last Assessment & Plan:  Formatting of this note might be different from the original. Concern over postnasal drainage. Throat awareness symptoms ever since she was packed in the right nostril last summer.  Was treated with antibiotics with some improvement.  Causes intermittent hoarseness and loss of voice.  Non-smoker. EXAM by anterior rhinoscopy shows no obvious polyps or purulence or obstructi   Precordial pain 05/04/2015   Primary insomnia 03/14/2015   Radiculitis of right cervical region 01/20/2018   Radiculopathy 04/02/2018   Right hand pain 06/24/2017   RLS (restless legs syndrome) 07/14/2015   Type 2 diabetes mellitus with diabetic neuropathy, with long-term current use of insulin ([place]) 02/16/2010   Qualifier: Diagnosis of  By: Koleen Nimrod MD, Jeffrey     Unstable angina [facility]) 06/11/2019    Past Surgical History:  Procedure Laterality  Date   ABDOMINAL HYSTERECTOMY     ANTERIOR CERVICAL DECOMP/DISCECTOMY FUSION N/A 04/02/2018   Procedure: ANTERIOR CERVICAL DECOMPRESSION FUSION, CERVICAL FOUR-FIVE, CERVICAL FIVE-SIX, WITH INSTRUMENTATION AND ALLOGRAFT;  Surgeon: Phylliss Bob, MD;  Location: [place];  Service: Orthopedics;  Laterality: N/A;   COLONOSCOPY WITH ESOPHAGOGASTRODUODENOSCOPY (EGD)  05/04/2009   CORONARY ARTERY BYPASS GRAFT N/A 06/14/2019   Procedure: CORONARY ARTERY BYPASS GRAFTING (CABG) using LIMA to LAD; Endoscopic Right Greater Saphenous Vein Harvest: SVG to Circ (distal); SVG to RCA (distal).;  Surgeon: [name], MD;  Location: [place];  Service: Open Heart Surgery;  Laterality: N/A;   ENDOVEIN HARVEST OF GREATER SAPHENOUS VEIN Right 06/14/2019   Procedure: [place]opes Of Greater Saphenous Vein;  Surgeon: [name], MD;  Location: [place];  Service: Open Heart Surgery;  Laterality: Right;   LEFT HEART CATH AND CORONARY ANGIOGRAPHY N/A 06/11/2019   Procedure: LEFT HEART CATH AND CORONARY ANGIOGRAPHY;  Surgeon: Wellington Hampshire, MD;  Location: [place] [facility];  Service: Cardiovascular;  Laterality: N/A;   PIP JOINT FUSION Bilateral    pinky   TEE WITHOUT CARDIOVERSION N/A 06/14/2019   Procedure: TRANSESOPHAGEAL ECHOCARDIOGRAM (TEE);  Surgeon: [name], MD;  Location: [facility];  Service: Open Heart Surgery;  Laterality: N/A;    There were no vitals filed for this visit.   Subjective Assessment - 12/21/20 1526     Subjective Pt reports that her knee is aching a little bit today. She notes it was feeling okay yesterday and after last treatment session.    Limitations Standing;House hold activities;Lifting    How long can you stand comfortably? No issues    How long can you walk comfortably? No issues    Patient Stated Goals Wants to walk up/down stairs normally    Currently in Pain? Yes    Pain Score 3     Pain Location Knee    Pain Orientation Right    Pain Descriptors / Indicators Aching    Pain Type Acute pain    Pain Onset 1 to 4 weeks ago                [facility] PT Assessment - 12/21/20 0001       Assessment   Medical Diagnosis Z98.890 (ICD-10-CM) - S/P right knee arthroscopy    Referring Provider (PT) Frederik Pear, MD    Hand Dominance Left      AROM   Right Knee Extension 0    Right Knee Flexion 120                           [facility] Adult PT Treatment/Exercise - 12/21/20 0001       Knee/Hip Exercises: Stretches   Active Hamstring Stretch 30 seconds    Active Hamstring Stretch Limitations contract relax 5x5 sec    Quad Stretch 2  reps;30 seconds;Right    Gastroc Stretch Right;30 seconds      Knee/Hip Exercises: Aerobic   Recumbent Bike L1 x 5 min      Knee/Hip Exercises: Machines for Strengthening   Total Gym Leg Press x10 DL @ 50#; x10 SL _0 # eccentrics      Knee/Hip Exercises: Standing   Lateral Step Up Right;2 sets;10 reps    SLS 2x30 sec    Other Standing Knee Exercises tandem stance 2x30 sec; tandem walking by counter forward & backward x2    Other Standing Knee Exercises Lateral step down 2x10  attempted on 6" but too high/painful; 4" was better     Knee/Hip Exercises: Seated   Sit to Sand 2 sets;10 reps;without UE support      Knee/Hip Exercises: Prone   Hamstring Curl 10 reps;2 sets                          PT Long Term Goals - 12/21/20 1612       PT LONG TERM GOAL #1   Title The patient will be indep with HEP progression.    Time 6    Period Weeks    Status On-going      PT LONG TERM GOAL #2   Title Pt will be able to negotiate steps with reciprocal pattern no hand rail    Baseline Unable without pain    Time 6    Period Weeks    Status Partially Met      PT LONG TERM GOAL #3   Title Pt will have improved FOTO score to at least 65    Baseline 55    Time 6    Period Weeks    Status On-going      PT LONG TERM GOAL #4   Title Pt will have R = L knee ROM    Baseline R knee 6-100 deg    Time 6    Period Weeks    Status Achieved      PT LONG TERM GOAL #5   Title Pt will be able to perform a squat with no pain to demo improved functional strength    Time 6    Period Weeks    Status On-going                   Plan - 12/21/20 1529     Clinical Impression Statement Pt with some mild increase in pain this session -- found trigger points along distal quad. Continued to work on manual therapy for quads and hamstrings. Contined to work on knee stabilization/balance.    Personal Factors and Comorbidities Age;Fitness;Time since onset of  injury/illness/exacerbation;Past/Current Experience    Examination-Activity Limitations Locomotion Level;Squat;Stairs    Examination-Participation Restrictions Art gallery manager;Valla Leaver [facility]    Stability/Clinical Decision Making Stable/Uncomplicated    Rehab Potential Good    PT Frequency 2x / week    PT Duration 6 weeks    PT Treatment/Interventions ADLs/Self Care Home Management;Aquatic Therapy;Cryotherapy;Electrical Stimulation;Iontophoresis 61m/ml Dexamethasone;DME Instruction;Gait training;Stair training;Functional mobility training;Orthotic Fit/Training;Therapeutic activities;Therapeutic exercise;Balance training;Neuromuscular re-education;Patient/family education;Manual techniques;Passive range of motion;Dry needling;Taping;Vasopneumatic Device;Scar mobilization    PT Next Visit Plan Stretch and manual therapy as needed for hamstring and gastroc trigger points. Continue knee and hip strengthening. Initiate knee stabilization/balance exercises.    PT Home Exercise Plan Access Code: [email or web address]    Consulted and Agree with Plan of Care Patient             Patient will benefit from skilled therapeutic intervention in order to improve the following deficits and impairments:  Decreased range of motion, Difficulty walking, Increased fascial restricitons, Increased muscle spasms, Pain, Decreased balance, Hypomobility, Improper body mechanics, Impaired flexibility, Decreased mobility, Decreased strength, Increased edema, Postural dysfunction  Visit Diagnosis: Acute pain of right knee  Muscle weakness (generalized)  Localized edema  Other abnormalities of gait and mobility     Problem List Patient Active Problem List   Diagnosis Date Noted   Acute meniscal tear of right knee 11/07/2020   Grief 11/07/2020   HSV-2 (herpes  simplex virus 2) infection - genital, (+)viral culture 08/2020 09/11/2020   Obesity    IBS (irritable bowel syndrome)    Hypertension     Hyperlipidemia    H/O: GI bleed    Gastrointestinal ulcer    Gastro-esophageal reflux    Diverticulosis    Depression    Colon polyps    Anxiety    Anemia    Post-nasal drainage 02/10/2020   S/P CABG x 3 LIMA to LAD, SVG sequential to RCA and circumflex in May 2021    Unstable angina ([place]) 06/11/2019   Angina pectoris ([place]) 06/04/2019   Abnormal stress test 05/24/2019   Abnormal stress echocardiogram 05/24/2019   Radiculopathy 04/02/2018   Abnormal ECG 04/01/2018   Radiculitis of right cervical region 01/20/2018   Carpal tunnel syndrome, right 12/16/2017   Right hand pain 06/24/2017   Impingement syndrome of shoulder, right 06/24/2017   Dupuytren's contracture of right hand 06/24/2017   Bilateral carpal tunnel syndrome 05/23/2017   Depression, major, in partial remission ([place]) 07/14/2015   RLS (restless legs syndrome) 07/14/2015   Atherosclerosis of native coronary artery with angina pectoris ([place]) 05/10/2015   Musculoskeletal chest pain 05/04/2015   Precordial pain 05/04/2015   Generalized anxiety disorder 03/14/2015   Primary insomnia 03/14/2015   Type 2 diabetes mellitus with diabetic neuropathy, with long-term current use of insulin ([place]) 02/16/2010   Hyperlipidemia LDL goal <70 02/16/2010   Essential hypertension 02/16/2010   GERD 02/16/2010   Bilateral knee pain 02/16/2010    [facility] April Gordy Levan, PT, DPT 12/21/2020, [place] [address] [place], [name], [address] Phone: [phone]   Fax:  [phone]  Name: Shavawn Stobaugh MRN: [id number] Date of Birth: 03-21-1960

## 2020-12-22 ENCOUNTER — Telehealth: Payer: Self-pay

## 2020-12-22 DIAGNOSIS — M5412 Radiculopathy, cervical region: Secondary | ICD-10-CM

## 2020-12-22 MED ORDER — GABAPENTIN 800 MG PO TABS
ORAL_TABLET | ORAL | 3 refills | Status: DC
Start: 2020-12-22 — End: 2021-02-07

## 2020-12-22 MED ORDER — ROPINIROLE HCL 3 MG PO TABS: 3.0000 mg | ORAL_TABLET | Freq: Every day | ORAL | 3 refills | Status: AC

## 2020-12-22 NOTE — Telephone Encounter (Signed)
Pt called and said that she needs the Rx for gabapentin and ropinirole sent to [facility] and not to [facility]. Rx's resent to pts requested pharmacy. Pt aware via voicemail.

## 2020-12-25 ENCOUNTER — Other Ambulatory Visit: Payer: Self-pay

## 2020-12-25 ENCOUNTER — Ambulatory Visit (INDEPENDENT_AMBULATORY_CARE_PROVIDER_SITE_OTHER): Payer: BC Managed Care – PPO | Admitting: Physical Therapy

## 2020-12-25 DIAGNOSIS — R2689 Other abnormalities of gait and mobility: Secondary | ICD-10-CM

## 2020-12-25 DIAGNOSIS — R600 Localized edema: Secondary | ICD-10-CM | POA: Diagnosis not present

## 2020-12-25 DIAGNOSIS — M6281 Muscle weakness (generalized): Secondary | ICD-10-CM | POA: Diagnosis not present

## 2020-12-25 DIAGNOSIS — M25561 Pain in right knee: Secondary | ICD-10-CM | POA: Diagnosis not present

## 2020-12-25 NOTE — Therapy (Signed)
[place], [name], [address] Phone: [phone]   Fax:  [phone]  Physical Therapy Treatment  Patient Details  Name: Tonya Chandler MRN: [id number] Date of Birth: May 17, 1960 Referring Provider (PT): Frederik Pear, MD   Encounter Date: 12/25/2020   PT End of Session - 12/25/20 1141     Visit Number 7    Number of Visits 12    Date for PT Re-Evaluation 01/12/21    Authorization Type BCBS    PT Start Time 1141    PT Stop Time 26    PT Time Calculation (min) 49 min    Activity Tolerance Patient tolerated treatment well    Behavior During Therapy [facility] for tasks assessed/performed             Past Medical History:  Diagnosis Date   Abnormal ECG 04/01/2018   Abnormal stress echocardiogram 05/24/2019   Formatting of this note might be different from the original. Added automatically from request for surgery [phone]   Abnormal stress test 05/24/2019   Formatting of this note might be different from the original. Added automatically from request for surgery [id number]   Anemia    Angina pectoris ([place]) 06/04/2019   Anxiety    Atherosclerosis of native coronary artery with angina pectoris ([place]) 05/10/2015   Formatting of this note might be different from the original. 30% lesion-Chiu   Bilateral carpal tunnel syndrome 05/23/2017   Bilateral knee pain 02/16/2010   Qualifier: Diagnosis of  By: Koleen Nimrod MD, Jeffrey     Carpal tunnel syndrome, right 12/16/2017   Colon polyps    Depression    DEPRESSION 02/16/2010   Qualifier: Diagnosis of  By: Koleen Nimrod MD, Dellis Filbert     Depression, major, in partial remission ([place]) 07/14/2015   Diabetes mellitus    Diverticulosis    Dupuytren's contracture of right hand 06/24/2017   Essential hypertension 02/16/2010   Qualifier: Diagnosis of  By: Koleen Nimrod MD, Dellis Filbert     Gastro-esophageal reflux    Gastrointestinal ulcer    Generalized anxiety disorder 03/14/2015   GERD 02/16/2010    Qualifier: Diagnosis of  By: Koleen Nimrod MD, Dellis Filbert     H/O: GI bleed    Said it was due to taking metformin   Hyperlipidemia    Hyperlipidemia LDL goal <70 02/16/2010   Qualifier: Diagnosis of  By: Koleen Nimrod MD, Dellis Filbert     Hypertension    IBS (irritable bowel syndrome)    Impingement syndrome of shoulder, right 06/24/2017   Midline low back pain with left-sided sciatica 12/29/2013   Musculoskeletal chest pain 05/04/2015   Obesity    Post-nasal drainage 02/10/2020   Last Assessment & Plan:  Formatting of this note might be different from the original. Concern over postnasal drainage. Throat awareness symptoms ever since she was packed in the right nostril last summer.  Was treated with antibiotics with some improvement.  Causes intermittent hoarseness and loss of voice.  Non-smoker. EXAM by anterior rhinoscopy shows no obvious polyps or purulence or obstructi   Precordial pain 05/04/2015   Primary insomnia 03/14/2015   Radiculitis of right cervical region 01/20/2018   Radiculopathy 04/02/2018   Right hand pain 06/24/2017   RLS (restless legs syndrome) 07/14/2015   Type 2 diabetes mellitus with diabetic neuropathy, with long-term current use of insulin ([place]) 02/16/2010   Qualifier: Diagnosis of  By: Koleen Nimrod MD, Jeffrey     Unstable angina [facility]) 06/11/2019    Past Surgical History:  Procedure Laterality  Date   ABDOMINAL HYSTERECTOMY     ANTERIOR CERVICAL DECOMP/DISCECTOMY FUSION N/A 04/02/2018   Procedure: ANTERIOR CERVICAL DECOMPRESSION FUSION, CERVICAL FOUR-FIVE, CERVICAL FIVE-SIX, WITH INSTRUMENTATION AND ALLOGRAFT;  Surgeon: Phylliss Bob, MD;  Location: [place];  Service: Orthopedics;  Laterality: N/A;   COLONOSCOPY WITH ESOPHAGOGASTRODUODENOSCOPY (EGD)  05/04/2009   CORONARY ARTERY BYPASS GRAFT N/A 06/14/2019   Procedure: CORONARY ARTERY BYPASS GRAFTING (CABG) using LIMA to LAD; Endoscopic Right Greater Saphenous Vein Harvest: SVG to Circ (distal); SVG to RCA (distal).;  Surgeon: [name], MD;  Location: [place];  Service: Open Heart Surgery;  Laterality: N/A;   ENDOVEIN HARVEST OF GREATER SAPHENOUS VEIN Right 06/14/2019   Procedure: [place] Ropes Of Greater Saphenous Vein;  Surgeon: [name], MD;  Location: [place];  Service: Open Heart Surgery;  Laterality: Right;   LEFT HEART CATH AND CORONARY ANGIOGRAPHY N/A 06/11/2019   Procedure: LEFT HEART CATH AND CORONARY ANGIOGRAPHY;  Surgeon: Wellington Hampshire, MD;  Location: [place] [facility];  Service: Cardiovascular;  Laterality: N/A;   PIP JOINT FUSION Bilateral    pinky   TEE WITHOUT CARDIOVERSION N/A 06/14/2019   Procedure: TRANSESOPHAGEAL ECHOCARDIOGRAM (TEE);  Surgeon: [name], MD;  Location: [place];  Service: Open Heart Surgery;  Laterality: N/A;    There were no vitals filed for this visit.   Subjective Assessment - 12/25/20 1142     Subjective Pt reports that she sat down to use the bathroom and twisted her knee outward.    Limitations Standing;House hold activities;Lifting    How long can you stand comfortably? No issues    How long can you walk comfortably? No issues    Patient Stated Goals Wants to walk up/down stairs normally    Currently in Pain? Yes    Pain Score 3     Pain Location Knee    Pain Orientation Right    Pain Descriptors / Indicators Aching    Pain Type Acute pain    Pain Onset 1 to 4 weeks ago                [facility] PT Assessment - 12/25/20 0001       Assessment   Medical Diagnosis Z98.890 (ICD-10-CM) - S/P right knee arthroscopy    Referring Provider (PT) Frederik Pear, MD    Hand Dominance Left                           [facility] Adult PT Treatment/Exercise - 12/25/20 0001       Knee/Hip Exercises: Stretches   Active Hamstring Stretch Right;2 reps;30 seconds    Quad Stretch 30 seconds    Hip Flexor Stretch Right;2 reps;30 seconds    Piriformis Stretch Right;30 seconds      Knee/Hip Exercises: Aerobic   Recumbent Bike L1 x 5 min       Knee/Hip Exercises: Standing   Lateral Step Up Right;2 sets;10 reps;Step Height: 6"    Forward Step Up 2 sets;Right;10 reps;Step Height: 6"      Knee/Hip Exercises: Seated   Sit to Sand 2 sets;10 reps;without UE support   staggered stance     Knee/Hip Exercises: Supine   Other Supine Knee/Hip Exercises knee flexion with pball into bridge 2x10      Knee/Hip Exercises: Sidelying   Hip ADduction Strengthening;Right;2 sets;10 reps    Other Sidelying Knee/Hip Exercises Reverse clam red tband 2x10      Vasopneumatic  Vasopnuematic Location  Knee    Vasopneumatic Pressure Low    Vasopneumatic Temperature  34      Manual Therapy   Kinesiotex Edema;Facilitate Muscle      Kinesiotix   Edema sensitive skin tape applied to Rt medial joint line to decompress tissue and assist with edema reduction.    Facilitate Muscle  I strip of sensitive skin Rock tape applied to Rt adductor/medially and distal/medial to knee with 20% stretch - to decrease edema, increase proprioception.                          PT Long Term Goals - 12/21/20 1612       PT LONG TERM GOAL #1   Title The patient will be indep with HEP progression.    Time 6    Period Weeks    Status On-going      PT LONG TERM GOAL #2   Title Pt will be able to negotiate steps with reciprocal pattern no hand rail    Baseline Unable without pain    Time 6    Period Weeks    Status Partially Met      PT LONG TERM GOAL #3   Title Pt will have improved FOTO score to at least 65    Baseline 55    Time 6    Period Weeks    Status On-going      PT LONG TERM GOAL #4   Title Pt will have R = L knee ROM    Baseline R knee 6-100 deg    Time 6    Period Weeks    Status Achieved      PT LONG TERM GOAL #5   Title Pt will be able to perform a squat with no pain to demo improved functional strength    Time 6    Period Weeks    Status On-going                   Plan - 12/25/20 1232     Clinical  Impression Statement Less tightness and trigger points noted along quad and hamstring. Some hip flexor tightness noted and provided stretching. Provided taping and ice to address pt's medial knee pain after twisting on it. Continued to progress strengthening as able. Pt now able to tolerate 6" step    Personal Factors and Comorbidities Age;Fitness;Time since onset of injury/illness/exacerbation;Past/Current Experience    Examination-Activity Limitations Locomotion Level;Squat;Stairs    Examination-Participation Restrictions Art gallery manager;Valla Leaver [facility]    Stability/Clinical Decision Making Stable/Uncomplicated    Rehab Potential Good    PT Frequency 2x / week    PT Duration 6 weeks    PT Treatment/Interventions ADLs/Self Care Home Management;Aquatic Therapy;Cryotherapy;Electrical Stimulation;Iontophoresis 61m/ml Dexamethasone;DME Instruction;Gait training;Stair training;Functional mobility training;Orthotic Fit/Training;Therapeutic activities;Therapeutic exercise;Balance training;Neuromuscular re-education;Patient/family education;Manual techniques;Passive range of motion;Dry needling;Taping;Vasopneumatic Device;Scar mobilization    PT Next Visit Plan Stretch and manual therapy as needed for hamstring and gastroc trigger points. Continue knee and hip strengthening. Continue stabilization/balance exercises.    PT Home Exercise Plan Access Code: [email or web address]    Consulted and Agree with Plan of Care Patient             Patient will benefit from skilled therapeutic intervention in order to improve the following deficits and impairments:  Decreased range of motion, Difficulty walking, Increased fascial restricitons, Increased muscle spasms, Pain, Decreased balance, Hypomobility, Improper body mechanics, Impaired flexibility, Decreased mobility, Decreased strength, Increased edema,  Postural dysfunction  Visit Diagnosis: Acute pain of right knee  Muscle weakness  (generalized)  Localized edema  Other abnormalities of gait and mobility     Problem List Patient Active Problem List   Diagnosis Date Noted   Acute meniscal tear of right knee 11/07/2020   Grief 11/07/2020   HSV-2 (herpes simplex virus 2) infection - genital, (+)viral culture 08/2020 09/11/2020   Obesity    IBS (irritable bowel syndrome)    Hypertension    Hyperlipidemia    H/O: GI bleed    Gastrointestinal ulcer    Gastro-esophageal reflux    Diverticulosis    Depression    Colon polyps    Anxiety    Anemia    Post-nasal drainage 02/10/2020   S/P CABG x 3 LIMA to LAD, SVG sequential to RCA and circumflex in May 2021    Unstable angina ([place]) 06/11/2019   Angina pectoris ([place]) 06/04/2019   Abnormal stress test 05/24/2019   Abnormal stress echocardiogram 05/24/2019   Radiculopathy 04/02/2018   Abnormal ECG 04/01/2018   Radiculitis of right cervical region 01/20/2018   Carpal tunnel syndrome, right 12/16/2017   Right hand pain 06/24/2017   Impingement syndrome of shoulder, right 06/24/2017   Dupuytren's contracture of right hand 06/24/2017   Bilateral carpal tunnel syndrome 05/23/2017   Depression, major, in partial remission ([place]) 07/14/2015   RLS (restless legs syndrome) 07/14/2015   Atherosclerosis of native coronary artery with angina pectoris ([place]) 05/10/2015   Musculoskeletal chest pain 05/04/2015   Precordial pain 05/04/2015   Generalized anxiety disorder 03/14/2015   Primary insomnia 03/14/2015   Type 2 diabetes mellitus with diabetic neuropathy, with long-term current use of insulin ([place]) 02/16/2010   Hyperlipidemia LDL goal <70 02/16/2010   Essential hypertension 02/16/2010   GERD 02/16/2010   Bilateral knee pain 02/16/2010    [facility] April Gordy Levan, [place], DPT 12/25/2020, 12:34 PM  [facility] [place] [address] [place], [name], [address] Phone: [phone]   Fax:  [phone]  Name: Bessye Stith MRN: [id number] Date of Birth: [birth year]

## 2020-12-28 ENCOUNTER — Other Ambulatory Visit: Payer: Self-pay

## 2020-12-28 ENCOUNTER — Ambulatory Visit (INDEPENDENT_AMBULATORY_CARE_PROVIDER_SITE_OTHER): Payer: BC Managed Care – PPO | Admitting: Physical Therapy

## 2020-12-28 DIAGNOSIS — M542 Cervicalgia: Secondary | ICD-10-CM | POA: Diagnosis not present

## 2020-12-28 DIAGNOSIS — R600 Localized edema: Secondary | ICD-10-CM | POA: Diagnosis not present

## 2020-12-28 DIAGNOSIS — M6281 Muscle weakness (generalized): Secondary | ICD-10-CM | POA: Diagnosis not present

## 2020-12-28 DIAGNOSIS — R2689 Other abnormalities of gait and mobility: Secondary | ICD-10-CM | POA: Diagnosis not present

## 2020-12-28 DIAGNOSIS — M25561 Pain in right knee: Secondary | ICD-10-CM

## 2020-12-28 NOTE — Therapy (Signed)
[place], [name], [address] Phone: [phone]   Fax:  [phone]  Physical Therapy Treatment  Patient Details  Name: Tonya Chandler MRN: [id number] Date of Birth: 08/26/1960 Referring Provider (PT): Frederik Pear, MD   Encounter Date: 12/28/2020   PT End of Session - 12/28/20 0931     Visit Number 8    Number of Visits 12    Date for PT Re-Evaluation 01/12/21    Authorization Type BCBS    PT Start Time 0932    PT Stop Time 1015    PT Time Calculation (min) 43 min    Activity Tolerance Patient tolerated treatment well    Behavior During Therapy [facility] for tasks assessed/performed             Past Medical History:  Diagnosis Date   Abnormal ECG 04/01/2018   Abnormal stress echocardiogram 05/24/2019   Formatting of this note might be different from the original. Added automatically from request for surgery [id number]   Abnormal stress test 05/24/2019   Formatting of this note might be different from the original. Added automatically from request for surgery [id number]   Anemia    Angina pectoris ([place]) 06/04/2019   Anxiety    Atherosclerosis of native coronary artery with angina pectoris ([place]) 05/10/2015   Formatting of this note might be different from the original. 30% lesion-Chiu   Bilateral carpal tunnel syndrome 05/23/2017   Bilateral knee pain 02/16/2010   Qualifier: Diagnosis of  By: Koleen Nimrod MD, Jeffrey     Carpal tunnel syndrome, right 12/16/2017   Colon polyps    Depression    DEPRESSION 02/16/2010   Qualifier: Diagnosis of  By: Koleen Nimrod MD, Dellis Filbert     Depression, major, in partial remission ([place]) 07/14/2015   Diabetes mellitus    Diverticulosis    Dupuytren's contracture of right hand 06/24/2017   Essential hypertension 02/16/2010   Qualifier: Diagnosis of  By: Koleen Nimrod MD, Dellis Filbert     Gastro-esophageal reflux    Gastrointestinal ulcer    Generalized anxiety disorder 03/14/2015   GERD 02/16/2010    Qualifier: Diagnosis of  By: Koleen Nimrod MD, Dellis Filbert     H/O: GI bleed    Said it was due to taking metformin   Hyperlipidemia    Hyperlipidemia LDL goal <70 02/16/2010   Qualifier: Diagnosis of  By: Koleen Nimrod MD, Dellis Filbert     Hypertension    IBS (irritable bowel syndrome)    Impingement syndrome of shoulder, right 06/24/2017   Midline low back pain with left-sided sciatica 12/29/2013   Musculoskeletal chest pain 05/04/2015   Obesity    Post-nasal drainage 02/10/2020   Last Assessment & Plan:  Formatting of this note might be different from the original. Concern over postnasal drainage. Throat awareness symptoms ever since she was packed in the right nostril last summer.  Was treated with antibiotics with some improvement.  Causes intermittent hoarseness and loss of voice.  Non-smoker. EXAM by anterior rhinoscopy shows no obvious polyps or purulence or obstructi   Precordial pain 05/04/2015   Primary insomnia 03/14/2015   Radiculitis of right cervical region 01/20/2018   Radiculopathy 04/02/2018   Right hand pain 06/24/2017   RLS (restless legs syndrome) 07/14/2015   Type 2 diabetes mellitus with diabetic neuropathy, with long-term current use of insulin ([place]) 02/16/2010   Qualifier: Diagnosis of  By: Koleen Nimrod MD, Jeffrey     Unstable angina [facility]) 06/11/2019    Past Surgical History:  Procedure Laterality  Date   ABDOMINAL HYSTERECTOMY     ANTERIOR CERVICAL DECOMP/DISCECTOMY FUSION N/A 04/02/2018   Procedure: ANTERIOR CERVICAL DECOMPRESSION FUSION, CERVICAL FOUR-FIVE, CERVICAL FIVE-SIX, WITH INSTRUMENTATION AND ALLOGRAFT;  Surgeon: Phylliss Bob, MD;  Location: [place];  Service: Orthopedics;  Laterality: N/A;   COLONOSCOPY WITH ESOPHAGOGASTRODUODENOSCOPY (EGD)  05/04/2009   CORONARY ARTERY BYPASS GRAFT N/A 06/14/2019   Procedure: CORONARY ARTERY BYPASS GRAFTING (CABG) using LIMA to LAD; Endoscopic Right Greater Saphenous Vein Harvest: SVG to Circ (distal); SVG to RCA (distal).;  Surgeon: [name], MD;  Location: [place];  Service: Open Heart Surgery;  Laterality: N/A;   ENDOVEIN HARVEST OF GREATER SAPHENOUS VEIN Right 06/14/2019   Procedure: [place] Of Greater Saphenous Vein;  Surgeon: [name], MD;  Location: [place];  Service: Open Heart Surgery;  Laterality: Right;   LEFT HEART CATH AND CORONARY ANGIOGRAPHY N/A 06/11/2019   Procedure: LEFT HEART CATH AND CORONARY ANGIOGRAPHY;  Surgeon: Wellington Hampshire, MD;  Location: [place] CV [facility];  Service: Cardiovascular;  Laterality: N/A;   PIP JOINT FUSION Bilateral    pinky   TEE WITHOUT CARDIOVERSION N/A 06/14/2019   Procedure: TRANSESOPHAGEAL ECHOCARDIOGRAM (TEE);  Surgeon: [name], MD;  Location: [place];  Service: Open Heart Surgery;  Laterality: N/A;    There were no vitals filed for this visit.   Subjective Assessment - 12/28/20 0933     Subjective Pt reports her knee is feeling a lot better. Liked the tape.    Limitations Standing;House hold activities;Lifting    How long can you stand comfortably? No issues    How long can you walk comfortably? No issues    Patient Stated Goals Wants to walk up/down stairs normally    Currently in Pain? No/denies    Pain Onset 1 to 4 weeks ago                [facility] PT Assessment - 12/28/20 0001       Assessment   Medical Diagnosis Z98.890 (ICD-10-CM) - S/P right knee arthroscopy    Referring Provider (PT) Frederik Pear, MD    Hand Dominance Left                           [facility] Adult PT Treatment/Exercise - 12/28/20 0001       Knee/Hip Exercises: Stretches   Active Hamstring Stretch Right;2 reps;30 seconds      Knee/Hip Exercises: Aerobic   Recumbent Bike L3 x 5 min      Knee/Hip Exercises: Machines for Strengthening   Total Gym Leg Press x10 DL @ 50#; x10 SL @50 # eccentrics      Knee/Hip Exercises: Standing   Lateral Step Up --    Forward Step Up --    Other Standing Knee Exercises Body squats x10      Knee/Hip  Exercises: Seated   Sit to Sand 2 sets;10 reps;without UE support   staggered stance     Knee/Hip Exercises: Sidelying   Hip ABduction Strengthening;Right;2 sets;10 reps    Hip ABduction Limitations green tband    Hip ADduction Strengthening;Right;2 sets;10 reps    Other Sidelying Knee/Hip Exercises Reverse clam green tband 2x10      Vasopneumatic   Vasopnuematic Location  Knee    Vasopneumatic Pressure Low    Vasopneumatic Temperature  34      Manual Therapy   Kinesiotex Edema;Facilitate Muscle      Kinesiotix   Facilitate  Muscle  2 I strips around distal patella to support quad                          PT Long Term Goals - 12/28/20 1001       PT LONG TERM GOAL #1   Title The patient will be indep with HEP progression.    Time 6    Period Weeks    Status On-going      PT LONG TERM GOAL #2   Title Pt will be able to negotiate steps with reciprocal pattern no hand rail    Baseline Unable without pain    Time 6    Period Weeks    Status Achieved      PT LONG TERM GOAL #3   Title Pt will have improved FOTO score to at least 65    Baseline 55    Time 6    Period Weeks    Status On-going      PT LONG TERM GOAL #4   Title Pt will have R = L knee ROM    Baseline R knee 6-100 deg    Time 6    Period Weeks    Status Achieved      PT LONG TERM GOAL #5   Title Pt will be able to perform a squat with no pain to demo improved functional strength    Time 6    Period Weeks    Status Achieved                   Plan - 12/28/20 0943     Clinical Impression Statement Knee pain has improved since last session. Continued to work on strengthening able to perform single body squat without pain. Pt liked tape; provided taping again this session. Pt continues to progress well towards her goals.    Personal Factors and Comorbidities Age;Fitness;Time since onset of injury/illness/exacerbation;Past/Current Experience    Examination-Activity Limitations  Locomotion Level;Squat;Stairs    Examination-Participation Restrictions Art gallery manager;Valla Leaver [facility]    Stability/Clinical Decision Making Stable/Uncomplicated    Rehab Potential Good    PT Frequency 2x / week    PT Duration 6 weeks    PT Treatment/Interventions ADLs/Self Care Home Management;Aquatic Therapy;Cryotherapy;Electrical Stimulation;Iontophoresis 4mg /ml Dexamethasone;DME Instruction;Gait training;Stair training;Functional mobility training;Orthotic Fit/Training;Therapeutic activities;Therapeutic exercise;Balance training;Neuromuscular re-education;Patient/family education;Manual techniques;Passive range of motion;Dry needling;Taping;Vasopneumatic Device;Scar mobilization    PT Next Visit Plan Stretch and manual therapy as needed for hamstring and gastroc trigger points. Continue knee and hip strengthening. Continue stabilization/balance exercises.    PT Home Exercise Plan Access Code: [email or web address]    Consulted and Agree with Plan of Care Patient             Patient will benefit from skilled therapeutic intervention in order to improve the following deficits and impairments:  Decreased range of motion, Difficulty walking, Increased fascial restricitons, Increased muscle spasms, Pain, Decreased balance, Hypomobility, Improper body mechanics, Impaired flexibility, Decreased mobility, Decreased strength, Increased edema, Postural dysfunction  Visit Diagnosis: Acute pain of right knee  Muscle weakness (generalized)  Localized edema  Other abnormalities of gait and mobility     Problem List Patient Active Problem List   Diagnosis Date Noted   Acute meniscal tear of right knee 11/07/2020   Grief 11/07/2020   HSV-2 (herpes simplex virus 2) infection - genital, (+)viral culture 08/2020 09/11/2020   Obesity    IBS (irritable bowel syndrome)    Hypertension    Hyperlipidemia  H/O: GI bleed    Gastrointestinal ulcer    Gastro-esophageal reflux     Diverticulosis    Depression    Colon polyps    Anxiety    Anemia    Post-nasal drainage 02/10/2020   S/P CABG x 3 LIMA to LAD, SVG sequential to RCA and circumflex in May 2021    Unstable angina ([place]) 06/11/2019   Angina pectoris ([place]) 06/04/2019   Abnormal stress test 05/24/2019   Abnormal stress echocardiogram 05/24/2019   Radiculopathy 04/02/2018   Abnormal ECG 04/01/2018   Radiculitis of right cervical region 01/20/2018   Carpal tunnel syndrome, right 12/16/2017   Right hand pain 06/24/2017   Impingement syndrome of shoulder, right 06/24/2017   Dupuytren's contracture of right hand 06/24/2017   Bilateral carpal tunnel syndrome 05/23/2017   Depression, major, in partial remission ([place]) 07/14/2015   RLS (restless legs syndrome) 07/14/2015   Atherosclerosis of native coronary artery with angina pectoris ([place]) 05/10/2015   Musculoskeletal chest pain 05/04/2015   Precordial pain 05/04/2015   Generalized anxiety disorder 03/14/2015   Primary insomnia 03/14/2015   Type 2 diabetes mellitus with diabetic neuropathy, with long-term current use of insulin ([place]) 02/16/2010   Hyperlipidemia LDL goal <70 02/16/2010   Essential hypertension 02/16/2010   GERD 02/16/2010   Bilateral knee pain 02/16/2010    [facility] April Ma L Buelah Rennie, PT, DPT 12/28/2020, 10:03 AM  [facility] [place] [address] [place], [name], [address] Phone: [phone]   Fax:  [phone]  Name: Tonya Chandler MRN: [id number] Date of Birth: 03-21-1960

## 2021-01-01 DIAGNOSIS — M4696 Unspecified inflammatory spondylopathy, lumbar region: Secondary | ICD-10-CM | POA: Diagnosis not present

## 2021-01-02 ENCOUNTER — Other Ambulatory Visit: Payer: Self-pay | Admitting: Osteopathic Medicine

## 2021-01-02 ENCOUNTER — Ambulatory Visit (INDEPENDENT_AMBULATORY_CARE_PROVIDER_SITE_OTHER): Payer: BC Managed Care – PPO | Admitting: Physical Therapy

## 2021-01-02 ENCOUNTER — Encounter: Payer: Self-pay | Admitting: Physical Therapy

## 2021-01-02 ENCOUNTER — Other Ambulatory Visit: Payer: Self-pay

## 2021-01-02 DIAGNOSIS — R2689 Other abnormalities of gait and mobility: Secondary | ICD-10-CM | POA: Diagnosis not present

## 2021-01-02 DIAGNOSIS — M6281 Muscle weakness (generalized): Secondary | ICD-10-CM

## 2021-01-02 DIAGNOSIS — R600 Localized edema: Secondary | ICD-10-CM | POA: Diagnosis not present

## 2021-01-02 DIAGNOSIS — M25561 Pain in right knee: Secondary | ICD-10-CM

## 2021-01-02 NOTE — Therapy (Signed)
[place], [name], [address] Phone: [phone]   Fax:  [phone]  Physical Therapy Treatment  Patient Details  Name: Tonya Chandler MRN: [id number] Date of Birth: 08-08-1960 Referring Provider (PT): Frederik Pear, MD   Encounter Date: 01/02/2021   PT End of Session - 01/02/21 0941     Visit Number 9    Number of Visits 12    Date for PT Re-Evaluation 01/12/21    Authorization Type BCBS    PT Start Time 0933    PT Stop Time 1021   MHP/ice last 10 min of session   PT Time Calculation (min) 48 min    Activity Tolerance Patient tolerated treatment well    Behavior During Therapy [facility] for tasks assessed/performed             Past Medical History:  Diagnosis Date   Abnormal ECG 04/01/2018   Abnormal stress echocardiogram 05/24/2019   Formatting of this note might be different from the original. Added automatically from request for surgery [id number]   Abnormal stress test 05/24/2019   Formatting of this note might be different from the original. Added automatically from request for surgery [id number]   Anemia    Angina pectoris ([place]) 06/04/2019   Anxiety    Atherosclerosis of native coronary artery with angina pectoris ([place]) 05/10/2015   Formatting of this note might be different from the original. 30% lesion-Chiu   Bilateral carpal tunnel syndrome 05/23/2017   Bilateral knee pain 02/16/2010   Qualifier: Diagnosis of  By: Koleen Nimrod MD, Jeffrey     Carpal tunnel syndrome, right 12/16/2017   Colon polyps    Depression    DEPRESSION 02/16/2010   Qualifier: Diagnosis of  By: Koleen Nimrod MD, Dellis Filbert     Depression, major, in partial remission ([place]) 07/14/2015   Diabetes mellitus    Diverticulosis    Dupuytren's contracture of right hand 06/24/2017   Essential hypertension 02/16/2010   Qualifier: Diagnosis of  By: Koleen Nimrod MD, Dellis Filbert     Gastro-esophageal reflux    Gastrointestinal ulcer    Generalized anxiety  disorder 03/14/2015   GERD 02/16/2010   Qualifier: Diagnosis of  By: Koleen Nimrod MD, Dellis Filbert     H/O: GI bleed    Said it was due to taking metformin   Hyperlipidemia    Hyperlipidemia LDL goal <70 02/16/2010   Qualifier: Diagnosis of  By: Koleen Nimrod MD, Dellis Filbert     Hypertension    IBS (irritable bowel syndrome)    Impingement syndrome of shoulder, right 06/24/2017   Midline low back pain with left-sided sciatica 12/29/2013   Musculoskeletal chest pain 05/04/2015   Obesity    Post-nasal drainage 02/10/2020   Last Assessment & Plan:  Formatting of this note might be different from the original. Concern over postnasal drainage. Throat awareness symptoms ever since she was packed in the right nostril last summer.  Was treated with antibiotics with some improvement.  Causes intermittent hoarseness and loss of voice.  Non-smoker. EXAM by anterior rhinoscopy shows no obvious polyps or purulence or obstructi   Precordial pain 05/04/2015   Primary insomnia 03/14/2015   Radiculitis of right cervical region 01/20/2018   Radiculopathy 04/02/2018   Right hand pain 06/24/2017   RLS (restless legs syndrome) 07/14/2015   Type 2 diabetes mellitus with diabetic neuropathy, with long-term current use of insulin ([place]) 02/16/2010   Qualifier: Diagnosis of  By: Koleen Nimrod MD, Jeffrey     Unstable angina [facility]) 06/11/2019  Past Surgical History:  Procedure Laterality Date   ABDOMINAL HYSTERECTOMY     ANTERIOR CERVICAL DECOMP/DISCECTOMY FUSION N/A 04/02/2018   Procedure: ANTERIOR CERVICAL DECOMPRESSION FUSION, CERVICAL FOUR-FIVE, CERVICAL FIVE-SIX, WITH INSTRUMENTATION AND ALLOGRAFT;  Surgeon: Phylliss Bob, MD;  Location: [place];  Service: Orthopedics;  Laterality: N/A;   COLONOSCOPY WITH ESOPHAGOGASTRODUODENOSCOPY (EGD)  05/04/2009   CORONARY ARTERY BYPASS GRAFT N/A 06/14/2019   Procedure: CORONARY ARTERY BYPASS GRAFTING (CABG) using LIMA to LAD; Endoscopic Right Greater Saphenous Vein Harvest: SVG to Circ (distal); SVG to  RCA (distal).;  Surgeon: [name], MD;  Location: [place];  Service: Open Heart Surgery;  Laterality: N/A;   ENDOVEIN HARVEST OF GREATER SAPHENOUS VEIN Right 06/14/2019   Procedure: [place] Of Greater Saphenous Vein;  Surgeon: [name], MD;  Location: [place];  Service: Open Heart Surgery;  Laterality: Right;   LEFT HEART CATH AND CORONARY ANGIOGRAPHY N/A 06/11/2019   Procedure: LEFT HEART CATH AND CORONARY ANGIOGRAPHY;  Surgeon: Wellington Hampshire, MD;  Location: [place] CV LAB;  Service: Cardiovascular;  Laterality: N/A;   PIP JOINT FUSION Bilateral    pinky   TEE WITHOUT CARDIOVERSION N/A 06/14/2019   Procedure: TRANSESOPHAGEAL ECHOCARDIOGRAM (TEE);  Surgeon: [name], MD;  Location: [place];  Service: Open Heart Surgery;  Laterality: N/A;    There were no vitals filed for this visit.   Subjective Assessment - 01/02/21 0941     Subjective Pt reports that her Rt knee "collapsed" Sunday morning while moving boxes (lateral stepping). Stairs are problematic again.  Despite this, she reports she is able to get up from chair without using UE - which is an improvement.    Currently in Pain? Yes    Pain Score 4     Pain Location Knee    Pain Orientation Right;Anterior;Distal    Pain Descriptors / Indicators Aching    Aggravating Factors  side stepping, stairs    Pain Relieving Factors ice                [facility] PT Assessment - 01/02/21 0001       Assessment   Medical Diagnosis Z98.890 (ICD-10-CM) - S/P right knee arthroscopy    Referring Provider (PT) Frederik Pear, MD    Hand Dominance Left      Flexibility   Quadriceps Rt 110               [place] Adult PT Treatment/Exercise - 01/02/21 0001       Knee/Hip Exercises: Stretches   Passive Hamstring Stretch Right;2 reps;20 seconds    Quad Stretch Right;3 reps;30 seconds    ITB Stretch Right;2 reps;20 seconds    Gastroc Stretch Right;2 reps;30 seconds    Other Knee/Hip Stretches Rt Adductor  stretch in supine with strap x 20 sec x 2      Knee/Hip Exercises: Aerobic   Recumbent Bike L2 x 5 min      Knee/Hip Exercises: Standing   Other Standing Knee Exercises lateral stepping L/R x 5 ft x 3 reps (after DN)      Knee/Hip Exercises: Seated   Sit to Sand 1 set;10 reps;without UE support   eccentric lowering     Manual Therapy   Manual therapy comments skilled palpation to assess response to STM and DN    Soft tissue mobilization STM to Rt hamstring to decrease fascial restrictions.      Kinesiotix   Facilitate Muscle  2, I- strips of sensitive skin Rock  tape around distal patella              Trigger Point Dry Needling - 01/02/21 0001     Consent Given? Yes    Education Handout Provided Previously provided    Hamstring Response Twitch response elicited;Palpable increased muscle length                        PT Long Term Goals - 12/28/20 1001       PT LONG TERM GOAL #1   Title The patient will be indep with HEP progression.    Time 6    Period Weeks    Status On-going      PT LONG TERM GOAL #2   Title Pt will be able to negotiate steps with reciprocal pattern no hand rail    Baseline Unable without pain    Time 6    Period Weeks    Status Achieved      PT LONG TERM GOAL #3   Title Pt will have improved FOTO score to at least 65    Baseline 55    Time 6    Period Weeks    Status On-going      PT LONG TERM GOAL #4   Title Pt will have R = L knee ROM    Baseline R knee 6-100 deg    Time 6    Period Weeks    Status Achieved      PT LONG TERM GOAL #5   Title Pt will be able to perform a squat with no pain to demo improved functional strength    Time 6    Period Weeks    Status Achieved                   Plan - 01/02/21 1000     Clinical Impression Statement Flare up this weekend with lateral stepping while lifting boxes.  Palpable tightness found in Rt biceps femoris; improved tissue length with STM and DN to area. DN  provided by supervising PT, Isabelle Course.  Session focused on symptom management and return to previous level of function.    Personal Factors and Comorbidities Age;Fitness;Time since onset of injury/illness/exacerbation;Past/Current Experience    Examination-Activity Limitations Locomotion Level;Squat;Stairs    Examination-Participation Restrictions Art gallery manager;Valla Leaver [facility]    Stability/Clinical Decision Making Stable/Uncomplicated    Rehab Potential Good    PT Frequency 2x / week    PT Duration 6 weeks    PT Treatment/Interventions ADLs/Self Care Home Management;Aquatic Therapy;Cryotherapy;Electrical Stimulation;Iontophoresis 4mg /ml Dexamethasone;DME Instruction;Gait training;Stair training;Functional mobility training;Orthotic Fit/Training;Therapeutic activities;Therapeutic exercise;Balance training;Neuromuscular re-education;Patient/family education;Manual techniques;Passive range of motion;Dry needling;Taping;Vasopneumatic Device;Scar mobilization    PT Next Visit Plan Stretch and manual therapy as needed for hamstring and gastroc trigger points. Continue knee and hip strengthening. Continue stabilization/balance exercises.    PT Home Exercise Plan Access Code: [email or web address]    Consulted and Agree with Plan of Care Patient             Patient will benefit from skilled therapeutic intervention in order to improve the following deficits and impairments:  Decreased range of motion, Difficulty walking, Increased fascial restricitons, Increased muscle spasms, Pain, Decreased balance, Hypomobility, Improper body mechanics, Impaired flexibility, Decreased mobility, Decreased strength, Increased edema, Postural dysfunction  Visit Diagnosis: Acute pain of right knee  Muscle weakness (generalized)  Localized edema  Other abnormalities of gait and mobility     Problem List Patient Active Problem List  Diagnosis Date Noted   Acute meniscal tear of right knee  11/07/2020   Grief 11/07/2020   HSV-2 (herpes simplex virus 2) infection - genital, (+)viral culture 08/2020 09/11/2020   Obesity    IBS (irritable bowel syndrome)    Hypertension    Hyperlipidemia    H/O: GI bleed    Gastrointestinal ulcer    Gastro-esophageal reflux    Diverticulosis    Depression    Colon polyps    Anxiety    Anemia    Post-nasal drainage 02/10/2020   S/P CABG x 3 LIMA to LAD, SVG sequential to RCA and circumflex in May 2021    Unstable angina ([place]) 06/11/2019   Angina pectoris ([place]) 06/04/2019   Abnormal stress test 05/24/2019   Abnormal stress echocardiogram 05/24/2019   Radiculopathy 04/02/2018   Abnormal ECG 04/01/2018   Radiculitis of right cervical region 01/20/2018   Carpal tunnel syndrome, right 12/16/2017   Right hand pain 06/24/2017   Impingement syndrome of shoulder, right 06/24/2017   Dupuytren's contracture of right hand 06/24/2017   Bilateral carpal tunnel syndrome 05/23/2017   Depression, major, in partial remission ([place]) 07/14/2015   RLS (restless legs syndrome) 07/14/2015   Atherosclerosis of native coronary artery with angina pectoris ([place]) 05/10/2015   Musculoskeletal chest pain 05/04/2015   Precordial pain 05/04/2015   Generalized anxiety disorder 03/14/2015   Primary insomnia 03/14/2015   Type 2 diabetes mellitus with diabetic neuropathy, with long-term current use of insulin ([place]) 02/16/2010   Hyperlipidemia LDL goal <70 02/16/2010   Essential hypertension 02/16/2010   GERD 02/16/2010   Bilateral knee pain 02/16/2010   Kerin Perna, PTA 01/02/21 10:13 AM   Isabelle Course, PT   [facility] [place] [address] [place], [name], [address] Phone: [phone]   Fax:  [phone]  Name: Tonya Chandler MRN: [id number] Date of Birth: Jun 10, 1960

## 2021-01-04 ENCOUNTER — Ambulatory Visit (INDEPENDENT_AMBULATORY_CARE_PROVIDER_SITE_OTHER): Payer: BC Managed Care – PPO | Admitting: Physical Therapy

## 2021-01-04 ENCOUNTER — Other Ambulatory Visit: Payer: Self-pay

## 2021-01-04 DIAGNOSIS — R2689 Other abnormalities of gait and mobility: Secondary | ICD-10-CM

## 2021-01-04 DIAGNOSIS — M25561 Pain in right knee: Secondary | ICD-10-CM

## 2021-01-04 DIAGNOSIS — M6281 Muscle weakness (generalized): Secondary | ICD-10-CM

## 2021-01-04 DIAGNOSIS — R600 Localized edema: Secondary | ICD-10-CM

## 2021-01-04 NOTE — Therapy (Addendum)
[place], [name], [address] Phone: [phone]   Fax:  [phone]  Physical Therapy Treatment  Patient Details  Name: Tonya Chandler MRN: [id number] Date of Birth: [birth year] Referring Provider (PT): Frederik Pear, MD   Encounter Date: 01/04/2021   PT End of Session - 01/04/21 1141     Visit Number 10    Number of Visits 12    Date for PT Re-Evaluation 01/12/21    Authorization Type BCBS    PT Start Time 935   PT Stop Time 5681    PT Time Calculation (min) 40 min    Activity Tolerance Patient tolerated treatment well    Behavior During Therapy [facility] for tasks assessed/performed     Past Medical History:  Diagnosis Date   Abnormal ECG 04/01/2018   Abnormal stress echocardiogram 05/24/2019   Formatting of this note might be different from the original. Added automatically from request for surgery [id number]   Abnormal stress test 05/24/2019   Formatting of this note might be different from the original. Added automatically from request for surgery [id number]   Anemia    Angina pectoris ([place]) 06/04/2019   Anxiety    Atherosclerosis of native coronary artery with angina pectoris ([place]) 05/10/2015   Formatting of this note might be different from the original. 30% lesion-Chiu   Bilateral carpal tunnel syndrome 05/23/2017   Bilateral knee pain 02/16/2010   Qualifier: Diagnosis of  By: Koleen Nimrod MD, Jeffrey     Carpal tunnel syndrome, right 12/16/2017   Colon polyps    Depression    DEPRESSION 02/16/2010   Qualifier: Diagnosis of  By: Koleen Nimrod MD, Dellis Filbert     Depression, major, in partial remission ([place]) 07/14/2015   Diabetes mellitus    Diverticulosis    Dupuytren's contracture of right hand 06/24/2017   Essential hypertension 02/16/2010   Qualifier: Diagnosis of  By: Koleen Nimrod MD, Dellis Filbert     Gastro-esophageal reflux    Gastrointestinal ulcer    Generalized anxiety disorder 03/14/2015   GERD 02/16/2010    Qualifier: Diagnosis of  By: Koleen Nimrod MD, Dellis Filbert     H/O: GI bleed    Said it was due to taking metformin   Hyperlipidemia    Hyperlipidemia LDL goal <70 02/16/2010   Qualifier: Diagnosis of  By: Koleen Nimrod MD, Dellis Filbert     Hypertension    IBS (irritable bowel syndrome)    Impingement syndrome of shoulder, right 06/24/2017   Midline low back pain with left-sided sciatica 12/29/2013   Musculoskeletal chest pain 05/04/2015   Obesity    Post-nasal drainage 02/10/2020   Last Assessment & Plan:  Formatting of this note might be different from the original. Concern over postnasal drainage. Throat awareness symptoms ever since she was packed in the right nostril last summer.  Was treated with antibiotics with some improvement.  Causes intermittent hoarseness and loss of voice.  Non-smoker. EXAM by anterior rhinoscopy shows no obvious polyps or purulence or obstructi   Precordial pain 05/04/2015   Primary insomnia 03/14/2015   Radiculitis of right cervical region 01/20/2018   Radiculopathy 04/02/2018   Right hand pain 06/24/2017   RLS (restless legs syndrome) 07/14/2015   Type 2 diabetes mellitus with diabetic neuropathy, with long-term current use of insulin ([place]) 02/16/2010   Qualifier: Diagnosis of  By: Koleen Nimrod MD, Jeffrey     Unstable angina [facility]) 06/11/2019    Past Surgical History:  Procedure Laterality Date   ABDOMINAL HYSTERECTOMY  ANTERIOR CERVICAL DECOMP/DISCECTOMY FUSION N/A 04/02/2018   Procedure: ANTERIOR CERVICAL DECOMPRESSION FUSION, CERVICAL FOUR-FIVE, CERVICAL FIVE-SIX, WITH INSTRUMENTATION AND ALLOGRAFT;  Surgeon: Phylliss Bob, MD;  Location: [place];  Service: Orthopedics;  Laterality: N/A;   COLONOSCOPY WITH ESOPHAGOGASTRODUODENOSCOPY (EGD)  05/04/2009   CORONARY ARTERY BYPASS GRAFT N/A 06/14/2019   Procedure: CORONARY ARTERY BYPASS GRAFTING (CABG) using LIMA to LAD; Endoscopic Right Greater Saphenous Vein Harvest: SVG to Circ (distal); SVG to RCA (distal).;  Surgeon: [name], MD;  Location: [place];  Service: Open Heart Surgery;  Laterality: N/A;   ENDOVEIN HARVEST OF GREATER SAPHENOUS VEIN Right 06/14/2019   Procedure: [place] Of Greater Saphenous Vein;  Surgeon: [name], MD;  Location: [place];  Service: Open Heart Surgery;  Laterality: Right;   LEFT HEART CATH AND CORONARY ANGIOGRAPHY N/A 06/11/2019   Procedure: LEFT HEART CATH AND CORONARY ANGIOGRAPHY;  Surgeon: Wellington Hampshire, MD;  Location: [place] CV LAB;  Service: Cardiovascular;  Laterality: N/A;   PIP JOINT FUSION Bilateral    pinky   TEE WITHOUT CARDIOVERSION N/A 06/14/2019   Procedure: TRANSESOPHAGEAL ECHOCARDIOGRAM (TEE);  Surgeon: [name], MD;  Location: [place];  Service: Open Heart Surgery;  Laterality: N/A;    There were no vitals filed for this visit.   Subjective Assessment - 01/04/21 0937     Subjective Pt states her knee is starting to feel better. It's still a little sore behind my knee.    Limitations Standing;House hold activities;Lifting    How long can you stand comfortably? No issues    How long can you walk comfortably? No issues    Patient Stated Goals Wants to walk up/down stairs normally    Currently in Pain? Yes    Pain Score 4     Pain Location Knee    Pain Orientation Right                               [facility] Adult PT Treatment/Exercise - 01/04/21 0001       Knee/Hip Exercises: Stretches   Passive Hamstring Stretch Right;2 reps;30 seconds    Quad Stretch 2 reps;30 seconds;Right    ITB Stretch Right;2 reps;30 seconds    Other Knee/Hip Stretches Rt Adductor stretch in supine with strap x 20 sec x 2      Knee/Hip Exercises: Aerobic   Recumbent Bike L2 x 5 min      Knee/Hip Exercises: Machines for Strengthening   Cybex Knee Extension 2x10 DL @ 10#    Cybex Knee Flexion 2x10 DL @ 10#      Knee/Hip Exercises: Supine   Quad Sets --    Straight Leg Raises Strengthening;Right;2 sets;10 reps    Straight Leg  Raises Limitations 2.5 lb weight      Vasopneumatic   Vasopnuematic Location  Knee    Vasopneumatic Pressure Low    Vasopneumatic Temperature  34      Manual Therapy   Soft tissue mobilization IASTM and STM to Rt hamstring to decrease fascial restrictions.      Kinesiotix   Facilitate Muscle  2, I- strips of sensitive skin Rock tape around distal patella                          PT Long Term Goals - 12/28/20 1001       PT LONG TERM GOAL #1   Title  The patient will be indep with HEP progression.    Time 6    Period Weeks    Status On-going      PT LONG TERM GOAL #2   Title Pt will be able to negotiate steps with reciprocal pattern no hand rail    Baseline Unable without pain    Time 6    Period Weeks    Status Achieved      PT LONG TERM GOAL #3   Title Pt will have improved FOTO score to at least 65    Baseline 55    Time 6    Period Weeks    Status On-going      PT LONG TERM GOAL #4   Title Pt will have R = L knee ROM    Baseline R knee 6-100 deg    Time 6    Period Weeks    Status Achieved      PT LONG TERM GOAL #5   Title Pt will be able to perform a squat with no pain to demo improved functional strength    Time 6    Period Weeks    Status Achieved                   Plan - 01/04/21 0958     Clinical Impression Statement Pt with improving knee soreness from flaring it up during the weekend. Continued trigger points and tightness in hamstring -- provided manual therapy. Continued to work on gentle stretching for symptom management. Re-initiated gentle strengthening with machines.    Personal Factors and Comorbidities Age;Fitness;Time since onset of injury/illness/exacerbation;Past/Current Experience    Examination-Activity Limitations Locomotion Level;Squat;Stairs    Examination-Participation Restrictions Art gallery manager;Valla Leaver [facility]    Stability/Clinical Decision Making Stable/Uncomplicated    Rehab Potential  Good    PT Frequency 2x / week    PT Duration 6 weeks    PT Treatment/Interventions ADLs/Self Care Home Management;Aquatic Therapy;Cryotherapy;Electrical Stimulation;Iontophoresis 4mg /ml Dexamethasone;DME Instruction;Gait training;Stair training;Functional mobility training;Orthotic Fit/Training;Therapeutic activities;Therapeutic exercise;Balance training;Neuromuscular re-education;Patient/family education;Manual techniques;Passive range of motion;Dry needling;Taping;Vasopneumatic Device;Scar mobilization    PT Next Visit Plan Stretch and manual therapy as needed for hamstring and gastroc trigger points. Continue knee and hip strengthening. Continue stabilization/balance exercises.    PT Home Exercise Plan Access Code: [email or web address]    Consulted and Agree with Plan of Care Patient             Patient will benefit from skilled therapeutic intervention in order to improve the following deficits and impairments:  Decreased range of motion, Difficulty walking, Increased fascial restricitons, Increased muscle spasms, Pain, Decreased balance, Hypomobility, Improper body mechanics, Impaired flexibility, Decreased mobility, Decreased strength, Increased edema, Postural dysfunction  Visit Diagnosis: Acute pain of right knee  Muscle weakness (generalized)  Localized edema  Other abnormalities of gait and mobility     Problem List Patient Active Problem List   Diagnosis Date Noted   Acute meniscal tear of right knee 11/07/2020   Grief 11/07/2020   HSV-2 (herpes simplex virus 2) infection - genital, (+)viral culture 08/2020 09/11/2020   Obesity    IBS (irritable bowel syndrome)    Hypertension    Hyperlipidemia    H/O: GI bleed    Gastrointestinal ulcer    Gastro-esophageal reflux    Diverticulosis    Depression    Colon polyps    Anxiety    Anemia    Post-nasal drainage 02/10/2020   S/P CABG x 3 LIMA to LAD, SVG sequential to RCA  and circumflex in May 2021    Unstable angina ([place])  06/11/2019   Angina pectoris ([place]) 06/04/2019   Abnormal stress test 05/24/2019   Abnormal stress echocardiogram 05/24/2019   Radiculopathy 04/02/2018   Abnormal ECG 04/01/2018   Radiculitis of right cervical region 01/20/2018   Carpal tunnel syndrome, right 12/16/2017   Right hand pain 06/24/2017   Impingement syndrome of shoulder, right 06/24/2017   Dupuytren's contracture of right hand 06/24/2017   Bilateral carpal tunnel syndrome 05/23/2017   Depression, major, in partial remission ([place]) 07/14/2015   RLS (restless legs syndrome) 07/14/2015   Atherosclerosis of native coronary artery with angina pectoris ([place]) 05/10/2015   Musculoskeletal chest pain 05/04/2015   Precordial pain 05/04/2015   Generalized anxiety disorder 03/14/2015   Primary insomnia 03/14/2015   Type 2 diabetes mellitus with diabetic neuropathy, with long-term current use of insulin ([place]) 02/16/2010   Hyperlipidemia LDL goal <70 02/16/2010   Essential hypertension 02/16/2010   GERD 02/16/2010   Bilateral knee pain 02/16/2010    [facility] Skylynne Schlechter, PT, DPT 01/04/2021, 10:12 AM  [facility] [place] [address] [place], [name], [address] Phone: [phone]   Fax:  [phone]  Name: Tonya Chandler MRN: [id number] Date of Birth: 1960-10-26

## 2021-01-09 ENCOUNTER — Other Ambulatory Visit: Payer: Self-pay

## 2021-01-09 ENCOUNTER — Ambulatory Visit (INDEPENDENT_AMBULATORY_CARE_PROVIDER_SITE_OTHER): Payer: BC Managed Care – PPO | Admitting: Physical Therapy

## 2021-01-09 DIAGNOSIS — R600 Localized edema: Secondary | ICD-10-CM | POA: Diagnosis not present

## 2021-01-09 DIAGNOSIS — R2689 Other abnormalities of gait and mobility: Secondary | ICD-10-CM | POA: Diagnosis not present

## 2021-01-09 DIAGNOSIS — M25561 Pain in right knee: Secondary | ICD-10-CM | POA: Diagnosis not present

## 2021-01-09 DIAGNOSIS — M6281 Muscle weakness (generalized): Secondary | ICD-10-CM

## 2021-01-09 NOTE — Therapy (Signed)
[place], [name], [address] Phone: [phone]   Fax:  [phone]  Physical Therapy Treatment  Patient Details  Name: Tonya Chandler MRN: [id number] Date of Birth: Jul 22, 1960 Referring Provider (PT): Frederik Pear, MD   Encounter Date: 01/09/2021   PT End of Session - 01/09/21 0856     Visit Number 11    Number of Visits 12    Date for PT Re-Evaluation 01/12/21    Authorization Type BCBS    PT Start Time 5361    PT Stop Time 0925    PT Time Calculation (min) 38 min    Activity Tolerance Patient tolerated treatment well    Behavior During Therapy [facility] for tasks assessed/performed             Past Medical History:  Diagnosis Date   Abnormal ECG 04/01/2018   Abnormal stress echocardiogram 05/24/2019   Formatting of this note might be different from the original. Added automatically from request for surgery [id number]   Abnormal stress test 05/24/2019   Formatting of this note might be different from the original. Added automatically from request for surgery [id number]   Anemia    Angina pectoris ([place]) 06/04/2019   Anxiety    Atherosclerosis of native coronary artery with angina pectoris ([place]) 05/10/2015   Formatting of this note might be different from the original. 30% lesion-Chiu   Bilateral carpal tunnel syndrome 05/23/2017   Bilateral knee pain 02/16/2010   Qualifier: Diagnosis of  By: Koleen Nimrod MD, Jeffrey     Carpal tunnel syndrome, right 12/16/2017   Colon polyps    Depression    DEPRESSION 02/16/2010   Qualifier: Diagnosis of  By: Koleen Nimrod MD, Dellis Filbert     Depression, major, in partial remission ([place]) 07/14/2015   Diabetes mellitus    Diverticulosis    Dupuytren's contracture of right hand 06/24/2017   Essential hypertension 02/16/2010   Qualifier: Diagnosis of  By: Koleen Nimrod MD, Dellis Filbert     Gastro-esophageal reflux    Gastrointestinal ulcer    Generalized anxiety disorder 03/14/2015   GERD  02/16/2010   Qualifier: Diagnosis of  By: Koleen Nimrod MD, Dellis Filbert     H/O: GI bleed    Said it was due to taking metformin   Hyperlipidemia    Hyperlipidemia LDL goal <70 02/16/2010   Qualifier: Diagnosis of  By: Koleen Nimrod MD, Dellis Filbert     Hypertension    IBS (irritable bowel syndrome)    Impingement syndrome of shoulder, right 06/24/2017   Midline low back pain with left-sided sciatica 12/29/2013   Musculoskeletal chest pain 05/04/2015   Obesity    Post-nasal drainage 02/10/2020   Last Assessment & Plan:  Formatting of this note might be different from the original. Concern over postnasal drainage. Throat awareness symptoms ever since she was packed in the right nostril last summer.  Was treated with antibiotics with some improvement.  Causes intermittent hoarseness and loss of voice.  Non-smoker. EXAM by anterior rhinoscopy shows no obvious polyps or purulence or obstructi   Precordial pain 05/04/2015   Primary insomnia 03/14/2015   Radiculitis of right cervical region 01/20/2018   Radiculopathy 04/02/2018   Right hand pain 06/24/2017   RLS (restless legs syndrome) 07/14/2015   Type 2 diabetes mellitus with diabetic neuropathy, with long-term current use of insulin ([place]) 02/16/2010   Qualifier: Diagnosis of  By: Koleen Nimrod MD, Jeffrey     Unstable angina [facility]) 06/11/2019    Past Surgical History:  Procedure Laterality  Date   ABDOMINAL HYSTERECTOMY     ANTERIOR CERVICAL DECOMP/DISCECTOMY FUSION N/A 04/02/2018   Procedure: ANTERIOR CERVICAL DECOMPRESSION FUSION, CERVICAL FOUR-FIVE, CERVICAL FIVE-SIX, WITH INSTRUMENTATION AND ALLOGRAFT;  Surgeon: Phylliss Bob, MD;  Location: [place];  Service: Orthopedics;  Laterality: N/A;   COLONOSCOPY WITH ESOPHAGOGASTRODUODENOSCOPY (EGD)  05/04/2009   CORONARY ARTERY BYPASS GRAFT N/A 06/14/2019   Procedure: CORONARY ARTERY BYPASS GRAFTING (CABG) using LIMA to LAD; Endoscopic Right Greater Saphenous Vein Harvest: SVG to Circ (distal); SVG to RCA (distal).;  Surgeon:  [name], MD;  Location: [place];  Service: Open Heart Surgery;  Laterality: N/A;   ENDOVEIN HARVEST OF GREATER SAPHENOUS VEIN Right 06/14/2019   Procedure: [place] Of Greater Saphenous Vein;  Surgeon: [name], MD;  Location: [place];  Service: Open Heart Surgery;  Laterality: Right;   LEFT HEART CATH AND CORONARY ANGIOGRAPHY N/A 06/11/2019   Procedure: LEFT HEART CATH AND CORONARY ANGIOGRAPHY;  Surgeon: Wellington Hampshire, MD;  Location: [place] CV LAB;  Service: Cardiovascular;  Laterality: N/A;   PIP JOINT FUSION Bilateral    pinky   TEE WITHOUT CARDIOVERSION N/A 06/14/2019   Procedure: TRANSESOPHAGEAL ECHOCARDIOGRAM (TEE);  Surgeon: [name], MD;  Location: [place];  Service: Open Heart Surgery;  Laterality: N/A;    There were no vitals filed for this visit.   Subjective Assessment - 01/09/21 0851     Subjective Pt reports her knee has been feeling a little better.  Still has pain if she plants and twists. She has been trying to go down stairs the normal way.  She cancelled her follow up with surgeon.    Patient Stated Goals Wants to walk up/down stairs normally    Currently in Pain? Yes    Pain Score 1     Pain Location Knee    Pain Orientation Right    Pain Descriptors / Indicators Dull    Aggravating Factors  side stepping, descending stairs    Pain Relieving Factors ice                [facility] PT Assessment - 01/09/21 0001       Assessment   Medical Diagnosis Z98.890 (ICD-10-CM) - S/P right knee arthroscopy    Referring Provider (PT) Frederik Pear, MD    Hand Dominance Left             [facility] Adult PT Treatment/Exercise - 01/09/21 0001       Knee/Hip Exercises: Stretches   Passive Hamstring Stretch Right;2 reps;30 seconds    Quad Stretch 2 reps;30 seconds;Right    Gastroc Stretch Right;2 reps;30 seconds      Knee/Hip Exercises: Aerobic   Recumbent Bike L2 x 5 min      Knee/Hip Exercises: Machines for Strengthening   Cybex  Knee Extension 2x10 DL @ 10#      Knee/Hip Exercises: Standing   Heel Raises Right;1 set;10 reps    SLS Rt single leg sit to/from stand on high/low table x 10 with cues for form      Knee/Hip Exercises: Seated   Stool Scoot - Round Trips 25 ft, using primarily RLE.      Knee/Hip Exercises: Supine   Straight Leg Raises Strengthening;Right;2 sets;10 reps    Straight Leg Raises Limitations 2.5 lb weight      Knee/Hip Exercises: Sidelying   Hip ABduction Strengthening;Right;10 reps    Hip ABduction Limitations 2.5#  Kinesiotix   Facilitate Muscle  2, I- strips of sensitive skin Rock tape around distal patella                          PT Long Term Goals - 12/28/20 1001       PT LONG TERM GOAL #1   Title The patient will be indep with HEP progression.    Time 6    Period Weeks    Status On-going      PT LONG TERM GOAL #2   Title Pt will be able to negotiate steps with reciprocal pattern no hand rail    Baseline Unable without pain    Time 6    Period Weeks    Status Achieved      PT LONG TERM GOAL #3   Title Pt will have improved FOTO score to at least 65    Baseline 55    Time 6    Period Weeks    Status On-going      PT LONG TERM GOAL #4   Title Pt will have R = L knee ROM    Baseline R knee 6-100 deg    Time 6    Period Weeks    Status Achieved      PT LONG TERM GOAL #5   Title Pt will be able to perform a squat with no pain to demo improved functional strength    Time 6    Period Weeks    Status Achieved                   Plan - 01/09/21 1048     Clinical Impression Statement Pt tolerating strengthening exercises well, without increase in pain in Rt knee.  With tape application at end of session, pt reported elimination of pain.  Pt near meeting remaining goals. Discussed d/c planning for upcoming visit and management for if she has flare up after d/c.    Personal Factors and Comorbidities  Age;Fitness;Time since onset of injury/illness/exacerbation;Past/Current Experience    Examination-Activity Limitations Locomotion Level;Squat;Stairs    Examination-Participation Restrictions Art gallery manager;[facility]    Stability/Clinical Decision Making Stable/Uncomplicated    Rehab Potential Good    PT Frequency 2x / week    PT Duration 6 weeks    PT Treatment/Interventions ADLs/Self Care Home Management;Aquatic Therapy;Cryotherapy;Electrical Stimulation;Iontophoresis 4mg /ml Dexamethasone;DME Instruction;Gait training;Stair training;Functional mobility training;Orthotic Fit/Training;Therapeutic activities;Therapeutic exercise;Balance training;Neuromuscular re-education;Patient/family education;Manual techniques;Passive range of motion;Dry needling;Taping;Vasopneumatic Device;Scar mobilization    PT Next Visit Plan Assess goals - end of POC, FOTO   PT Home Exercise Plan Access Code: [email or web address]    Consulted and Agree with Plan of Care Patient             Patient will benefit from skilled therapeutic intervention in order to improve the following deficits and impairments:  Decreased range of motion, Difficulty walking, Increased fascial restricitons, Increased muscle spasms, Pain, Decreased balance, Hypomobility, Improper body mechanics, Impaired flexibility, Decreased mobility, Decreased strength, Increased edema, Postural dysfunction  Visit Diagnosis: Acute pain of right knee  Muscle weakness (generalized)  Localized edema  Other abnormalities of gait and mobility     Problem List Patient Active Problem List   Diagnosis Date Noted   Acute meniscal tear of right knee 11/07/2020   Grief 11/07/2020   HSV-2 (herpes simplex virus 2) infection - genital, (+)viral culture 08/2020 09/11/2020   Obesity    IBS (irritable bowel syndrome)    Hypertension  Hyperlipidemia    H/O: GI bleed    Gastrointestinal ulcer    Gastro-esophageal reflux    Diverticulosis     Depression    Colon polyps    Anxiety    Anemia    Post-nasal drainage 02/10/2020   S/P CABG x 3 LIMA to LAD, SVG sequential to RCA and circumflex in May 2021    Unstable angina ([place]) 06/11/2019   Angina pectoris ([place]) 06/04/2019   Abnormal stress test 05/24/2019   Abnormal stress echocardiogram 05/24/2019   Radiculopathy 04/02/2018   Abnormal ECG 04/01/2018   Radiculitis of right cervical region 01/20/2018   Carpal tunnel syndrome, right 12/16/2017   Right hand pain 06/24/2017   Impingement syndrome of shoulder, right 06/24/2017   Dupuytren's contracture of right hand 06/24/2017   Bilateral carpal tunnel syndrome 05/23/2017   Depression, major, in partial remission ([place]) 07/14/2015   RLS (restless legs syndrome) 07/14/2015   Atherosclerosis of native coronary artery with angina pectoris ([place]) 05/10/2015   Musculoskeletal chest pain 05/04/2015   Precordial pain 05/04/2015   Generalized anxiety disorder 03/14/2015   Primary insomnia 03/14/2015   Type 2 diabetes mellitus with diabetic neuropathy, with long-term current use of insulin ([place]) 02/16/2010   Hyperlipidemia LDL goal <70 02/16/2010   Essential hypertension 02/16/2010   GERD 02/16/2010   Bilateral knee pain 02/16/2010   Kerin Perna, PTA 01/09/21 10:55 AM   [place] [address] [place], [name], [address] Phone: [phone]   Fax:  [phone]  Name: Sandeep Delagarza MRN: [id number] Date of Birth: [birth year]

## 2021-01-11 ENCOUNTER — Encounter: Payer: Self-pay | Admitting: Physical Therapy

## 2021-01-11 ENCOUNTER — Ambulatory Visit (INDEPENDENT_AMBULATORY_CARE_PROVIDER_SITE_OTHER): Payer: BC Managed Care – PPO | Admitting: Physical Therapy

## 2021-01-11 ENCOUNTER — Encounter: Payer: BC Managed Care – PPO | Admitting: Physical Therapy

## 2021-01-11 ENCOUNTER — Other Ambulatory Visit: Payer: Self-pay

## 2021-01-11 DIAGNOSIS — R2689 Other abnormalities of gait and mobility: Secondary | ICD-10-CM

## 2021-01-11 DIAGNOSIS — M25561 Pain in right knee: Secondary | ICD-10-CM | POA: Diagnosis not present

## 2021-01-11 DIAGNOSIS — R600 Localized edema: Secondary | ICD-10-CM

## 2021-01-11 DIAGNOSIS — R293 Abnormal posture: Secondary | ICD-10-CM

## 2021-01-11 DIAGNOSIS — M6281 Muscle weakness (generalized): Secondary | ICD-10-CM

## 2021-01-11 NOTE — Patient Instructions (Signed)
Access Code: [email or web address] URL: [email or web address] Date: 01/11/2021 Prepared by: Isabelle Course  Exercises Prone Quadriceps Stretch with Strap - 1 x daily - 7 x weekly - 2 sets - 30 sec hold Seated Hamstring Stretch - 1 x daily - 7 x weekly - 2 sets - 30 sec hold Wall Quarter Squat - 1 x daily - 7 x weekly - 2 sets - 10 reps Prone Hamstring Curl with Anchored Resistance - 1 x daily - 7 x weekly - 2 sets - 10 reps Straight Leg Raise with Resistance - 1 x daily - 7 x weekly - 2 sets - 10 reps Side Stepping with Resistance at Ankles - 1 x daily - 7 x weekly - 2 sets - 10 reps Lateral Step Down - 1 x daily - 7 x weekly - 2 sets - 10 reps Staggered Sit-to-Stand - 1 x daily - 7 x weekly - 3 sets - 10 reps Sidelying Hip Adduction - 1 x daily - 7 x weekly - 2 sets - 10 reps Seated Assisted Cervical Rotation with Towel - 1 x daily - 7 x weekly - 1 sets - 10 reps - 10 seconds hold Seated Scapular Retraction - 1 x daily - 7 x weekly - 2 sets - 10 reps Seated Upper Trapezius Stretch - 1 x daily - 7 x weekly - 3 sets - 1 reps - 20-30 seconds hold

## 2021-01-11 NOTE — Therapy (Signed)
[place]  [place], [name], [address] Phone: [phone]   Fax:  [phone]  Physical Therapy Evaluation  Patient Details  Name: Tonya Chandler MRN: [id number] Date of Birth: [birth year] Referring Provider (PT): Phylliss Bob   Encounter Date: 01/11/2021   PT End of Session - 01/11/21 1041     Visit Number 12    Number of Visits 24    Date for PT Re-Evaluation 02/22/21    Authorization Type BCBS    PT Start Time 0930    PT Stop Time 9417    PT Time Calculation (min) 45 min    Activity Tolerance Patient tolerated treatment well    Behavior During Therapy [facility] for tasks assessed/performed             Past Medical History:  Diagnosis Date   Abnormal ECG 04/01/2018   Abnormal stress echocardiogram 05/24/2019   Formatting of this note might be different from the original. Added automatically from request for surgery [id number]   Abnormal stress test 05/24/2019   Formatting of this note might be different from the original. Added automatically from request for surgery [id number]   Anemia    Angina pectoris ([place]) 06/04/2019   Anxiety    Atherosclerosis of native coronary artery with angina pectoris ([place]) 05/10/2015   Formatting of this note might be different from the original. 30% lesion-Chiu   Bilateral carpal tunnel syndrome 05/23/2017   Bilateral knee pain 02/16/2010   Qualifier: Diagnosis of  By: Koleen Nimrod MD, Jeffrey     Carpal tunnel syndrome, right 12/16/2017   Colon polyps    Depression    DEPRESSION 02/16/2010   Qualifier: Diagnosis of  By: Koleen Nimrod MD, Dellis Filbert     Depression, major, in partial remission ([place]) 07/14/2015   Diabetes mellitus    Diverticulosis    Dupuytren's contracture of right hand 06/24/2017   Essential hypertension 02/16/2010   Qualifier: Diagnosis of  By: Koleen Nimrod MD, Dellis Filbert     Gastro-esophageal reflux    Gastrointestinal ulcer    Generalized anxiety disorder 03/14/2015   GERD  02/16/2010   Qualifier: Diagnosis of  By: Koleen Nimrod MD, Dellis Filbert     H/O: GI bleed    Said it was due to taking metformin   Hyperlipidemia    Hyperlipidemia LDL goal <70 02/16/2010   Qualifier: Diagnosis of  By: Koleen Nimrod MD, Dellis Filbert     Hypertension    IBS (irritable bowel syndrome)    Impingement syndrome of shoulder, right 06/24/2017   Midline low back pain with left-sided sciatica 12/29/2013   Musculoskeletal chest pain 05/04/2015   Obesity    Post-nasal drainage 02/10/2020   Last Assessment & Plan:  Formatting of this note might be different from the original. Concern over postnasal drainage. Throat awareness symptoms ever since she was packed in the right nostril last summer.  Was treated with antibiotics with some improvement.  Causes intermittent hoarseness and loss of voice.  Non-smoker. EXAM by anterior rhinoscopy shows no obvious polyps or purulence or obstructi   Precordial pain 05/04/2015   Primary insomnia 03/14/2015   Radiculitis of right cervical region 01/20/2018   Radiculopathy 04/02/2018   Right hand pain 06/24/2017   RLS (restless legs syndrome) 07/14/2015   Type 2 diabetes mellitus with diabetic neuropathy, with long-term current use of insulin ([place]) 02/16/2010   Qualifier: Diagnosis of  By: Koleen Nimrod MD, Jeffrey     Unstable angina [facility]) 06/11/2019    Past Surgical History:  Procedure Laterality Date  ABDOMINAL HYSTERECTOMY     ANTERIOR CERVICAL DECOMP/DISCECTOMY FUSION N/A 04/02/2018   Procedure: ANTERIOR CERVICAL DECOMPRESSION FUSION, CERVICAL FOUR-FIVE, CERVICAL FIVE-SIX, WITH INSTRUMENTATION AND ALLOGRAFT;  Surgeon: Phylliss Bob, MD;  Location: [place];  Service: Orthopedics;  Laterality: N/A;   COLONOSCOPY WITH ESOPHAGOGASTRODUODENOSCOPY (EGD)  05/04/2009   CORONARY ARTERY BYPASS GRAFT N/A 06/14/2019   Procedure: CORONARY ARTERY BYPASS GRAFTING (CABG) using LIMA to LAD; Endoscopic Right Greater Saphenous Vein Harvest: SVG to Circ (distal); SVG to RCA (distal).;  Surgeon:  [name], MD;  Location: [place];  Service: Open Heart Surgery;  Laterality: N/A;   ENDOVEIN HARVEST OF GREATER SAPHENOUS VEIN Right 06/14/2019   Procedure: [place] Ropes Of Greater Saphenous Vein;  Surgeon: [name], MD;  Location: [place];  Service: Open Heart Surgery;  Laterality: Right;   LEFT HEART CATH AND CORONARY ANGIOGRAPHY N/A 06/11/2019   Procedure: LEFT HEART CATH AND CORONARY ANGIOGRAPHY;  Surgeon: Wellington Hampshire, MD;  Location: [place] CV LAB;  Service: Cardiovascular;  Laterality: N/A;   PIP JOINT FUSION Bilateral    pinky   TEE WITHOUT CARDIOVERSION N/A 06/14/2019   Procedure: TRANSESOPHAGEAL ECHOCARDIOGRAM (TEE);  Surgeon: [name], MD;  Location: [place];  Service: Open Heart Surgery;  Laterality: N/A;    There were no vitals filed for this visit.    Subjective Assessment - 01/11/21 0936     Subjective Pt was had cervical fusion in 2020 with bone grafts. In the past 2-3 months she has started to have numbness down Rt UE with Rt cervical rotation and with looking down to read. Pt with no neck "pain", just radicular symptoms.    Pertinent History cervical fusion 2020, Lt rotator cuff tear    Limitations Reading    Diagnostic tests MRI shows arthritis of cervical spine    Patient Stated Goals reduce radicular symptoms    Currently in Pain? No/denies                [facility] PT Assessment - 01/11/21 0001       Assessment   Medical Diagnosis neck pain    Referring Provider (PT) Dumonski, Mark    Hand Dominance Left      Precautions   Precautions None      Restrictions   Weight Bearing Restrictions No      Balance Screen   Has the patient fallen in the past 6 months No      Prior Function   Level of Independence Independent      Observation/Other Assessments   Focus on Therapeutic Outcomes (FOTO)  not performed for neck.  59 - for knee.      Posture/Postural Control   Posture Comments forward head      AROM   AROM  Assessment Site Cervical    Cervical Flexion 45    Cervical Extension 45    Cervical - Right Side Bend 28    Cervical - Left Side Bend 31    Cervical - Right Rotation 21    Cervical - Left Rotation 42      Strength   Overall Strength Comments Rt shoulder flexion 3/5 (pain in neck), Rt shld abduction 4/5   Lt shoulder with torn rotator cuff     Flexibility   Soft Tissue Assessment /Muscle Length --   decreased length bilat upper traps, levator, scalenes     Palpation   Palpation comment increased mm spasticity bilat upper traps, levator, cervical paraspinals  Objective measurements completed on examination: See above findings.       [place] Adult PT Treatment/Exercise - 01/11/21 0001       Exercises   Exercises Neck      Neck Exercises: Seated   Other Seated Exercise scap squeeze x 5 with cues to reduce upper trap activation      Neck Exercises: Stretches   Upper Trapezius Stretch Right;Left;2 reps;20 seconds    Other Neck Stretches SNAG for cervical rotation 5 x 10 sec bilat      Manual Therapy   Manual therapy comments skilled palpation to assess affects of dry needling    Soft tissue mobilization STM upper traps, cervical paraspinals bilat              Trigger Point Dry Needling - 01/11/21 0001     Consent Given? Yes    Education Handout Provided Previously provided    Muscles Treated Head and Neck Upper trapezius;Cervical multifidi    Upper Trapezius Response Palpable increased muscle length    Cervical multifidi Response Palpable increased muscle length                   PT Education - 01/11/21 1008     Education Details updated HEP and PT POC and goals    Person(s) Educated Patient    Methods Explanation;Demonstration;Handout    Comprehension Returned demonstration;Verbalized understanding                 PT Long Term Goals - 01/11/21 1143       PT LONG TERM GOAL #1   Title The patient will be  indep with HEP progression.    Time 6    Period Weeks    Status On-going    Target Date 02/22/21      PT LONG TERM GOAL #2   Title pt will improve cervical rotation ROM by 10 degrees bilat to be able to drive with decreased difficulty    Time 6    Period Weeks    Status New    Target Date 02/22/21      PT LONG TERM GOAL #3   Title Pt will have improved FOTO score to at least 65    Baseline 59    Time 6    Period Weeks    Status On-going    Target Date 02/22/21      PT LONG TERM GOAL #4   Title Pt will improve postural strength to be able to read x 10 minutes without symptoms    Time 6    Period Weeks    Status New    Target Date 02/22/21                    Plan - 01/11/21 1140     Clinical Impression Statement Pt is a 60 y/o female currently being treated for her kne and now with a referral for neck pain. Pt presents with decreased cervical ROM, decreaesd UE Strength, impaired posture and decreased activity tolerance. Pt will benefit from skilled PT to address neck and knee impairments and improve functional mobility    Personal Factors and Comorbidities Age;Fitness;Time since onset of injury/illness/exacerbation;Past/Current Experience    Examination-Activity Limitations Locomotion Level;Squat;Stairs    Examination-Participation Restrictions Art gallery manager;Yard Work;Laundry;Driving    Stability/Clinical Decision Making Stable/Uncomplicated    Clinical Decision Making Low    Rehab Potential Good    PT Frequency 2x / week    PT Duration 6 weeks  PT Treatment/Interventions ADLs/Self Care Home Management;Aquatic Therapy;Cryotherapy;Electrical Stimulation;Iontophoresis 4mg /ml Dexamethasone;DME Instruction;Gait training;Stair training;Functional mobility training;Orthotic Fit/Training;Therapeutic activities;Therapeutic exercise;Balance training;Neuromuscular re-education;Patient/family education;Manual techniques;Passive range of motion;Dry  needling;Taping;Vasopneumatic Device;Scar mobilization;Moist Heat    PT Next Visit Plan assess cervical HEP, knee and postural strengthening, manual and modalities as needed    PT Home Exercise Plan Access Code: [email or web address]    Consulted and Agree with Plan of Care Patient             Patient will benefit from skilled therapeutic intervention in order to improve the following deficits and impairments:  Decreased range of motion, Difficulty walking, Increased fascial restricitons, Increased muscle spasms, Pain, Decreased balance, Hypomobility, Improper body mechanics, Impaired flexibility, Decreased mobility, Decreased strength, Increased edema, Postural dysfunction  Visit Diagnosis: Acute pain of right knee - Plan: PT plan of care cert/re-cert  Muscle weakness (generalized) - Plan: PT plan of care cert/re-cert  Localized edema - Plan: PT plan of care cert/re-cert  Other abnormalities of gait and mobility - Plan: PT plan of care cert/re-cert  Abnormal posture - Plan: PT plan of care cert/re-cert     Problem List Patient Active Problem List   Diagnosis Date Noted   Acute meniscal tear of right knee 11/07/2020   Grief 11/07/2020   HSV-2 (herpes simplex virus 2) infection - genital, (+)viral culture 08/2020 09/11/2020   Obesity    IBS (irritable bowel syndrome)    Hypertension    Hyperlipidemia    H/O: GI bleed    Gastrointestinal ulcer    Gastro-esophageal reflux    Diverticulosis    Depression    Colon polyps    Anxiety    Anemia    Post-nasal drainage 02/10/2020   S/P CABG x 3 LIMA to LAD, SVG sequential to RCA and circumflex in May 2021    Unstable angina ([place]) 06/11/2019   Angina pectoris ([place]) 06/04/2019   Abnormal stress test 05/24/2019   Abnormal stress echocardiogram 05/24/2019   Radiculopathy 04/02/2018   Abnormal ECG 04/01/2018   Radiculitis of right cervical region 01/20/2018   Carpal tunnel syndrome, right 12/16/2017   Right hand pain 06/24/2017    Impingement syndrome of shoulder, right 06/24/2017   Dupuytren's contracture of right hand 06/24/2017   Bilateral carpal tunnel syndrome 05/23/2017   Depression, major, in partial remission ([place]) 07/14/2015   RLS (restless legs syndrome) 07/14/2015   Atherosclerosis of native coronary artery with angina pectoris ([place]) 05/10/2015   Musculoskeletal chest pain 05/04/2015   Precordial pain 05/04/2015   Generalized anxiety disorder 03/14/2015   Primary insomnia 03/14/2015   Type 2 diabetes mellitus with diabetic neuropathy, with long-term current use of insulin ([place]) 02/16/2010   Hyperlipidemia LDL goal <70 02/16/2010   Essential hypertension 02/16/2010   GERD 02/16/2010   Bilateral knee pain 02/16/2010    Alicya Bena, PT 01/11/2021, 11:47 AM  [facility] [address] [place], [name], [address] Phone: [phone]   Fax:  [phone]  Name: Tonya Chandler MRN: [id number] Date of Birth: Apr 29, 1960

## 2021-01-16 ENCOUNTER — Other Ambulatory Visit: Payer: Self-pay

## 2021-01-16 ENCOUNTER — Encounter: Payer: Self-pay | Admitting: Physical Therapy

## 2021-01-16 ENCOUNTER — Ambulatory Visit (INDEPENDENT_AMBULATORY_CARE_PROVIDER_SITE_OTHER): Payer: BC Managed Care – PPO | Admitting: Physical Therapy

## 2021-01-16 DIAGNOSIS — R293 Abnormal posture: Secondary | ICD-10-CM

## 2021-01-16 DIAGNOSIS — M6281 Muscle weakness (generalized): Secondary | ICD-10-CM | POA: Diagnosis not present

## 2021-01-16 DIAGNOSIS — M25561 Pain in right knee: Secondary | ICD-10-CM | POA: Diagnosis not present

## 2021-01-16 NOTE — Therapy (Signed)
Tonya Chandler, [name], [address] Phone: [phone]   Fax:  [phone]  Physical Therapy Treatment  Patient Details  Name: Tonya Chandler MRN: [id number] Date of Birth: [birth year] Referring Provider (PT): Phylliss Bob / Frederik Pear   Encounter Date: 01/16/2021   PT End of Session - 01/16/21 1255     Visit Number 13    Number of Visits 24    Date for PT Re-Evaluation 02/22/21    Authorization Type BCBS    PT Start Time 1017    PT Stop Time 1100    PT Time Calculation (min) 43 min    Activity Tolerance Patient tolerated treatment well    Behavior During Therapy [facility] for tasks assessed/performed             Past Medical History:  Diagnosis Date   Abnormal ECG 04/01/2018   Abnormal stress echocardiogram 05/24/2019   Formatting of this note might be different from the original. Added automatically from request for surgery [phone]   Abnormal stress test 05/24/2019   Formatting of this note might be different from the original. Added automatically from request for surgery [id number]   Anemia    Angina pectoris ([place]) 06/04/2019   Anxiety    Atherosclerosis of native coronary artery with angina pectoris ([place]) 05/10/2015   Formatting of this note might be different from the original. 30% lesion-Chiu   Bilateral carpal tunnel syndrome 05/23/2017   Bilateral knee pain 02/16/2010   Qualifier: Diagnosis of  By: Koleen Nimrod MD, Jeffrey     Carpal tunnel syndrome, right 12/16/2017   Colon polyps    Depression    DEPRESSION 02/16/2010   Qualifier: Diagnosis of  By: Koleen Nimrod MD, Dellis Filbert     Depression, major, in partial remission ([place]) 07/14/2015   Diabetes mellitus    Diverticulosis    Dupuytren's contracture of right hand 06/24/2017   Essential hypertension 02/16/2010   Qualifier: Diagnosis of  By: Koleen Nimrod MD, Dellis Filbert     Gastro-esophageal reflux    Gastrointestinal ulcer    Generalized anxiety disorder 03/14/2015    GERD 02/16/2010   Qualifier: Diagnosis of  By: Koleen Nimrod MD, Dellis Filbert     H/O: GI bleed    Said it was due to taking metformin   Hyperlipidemia    Hyperlipidemia LDL goal <70 02/16/2010   Qualifier: Diagnosis of  By: Koleen Nimrod MD, Dellis Filbert     Hypertension    IBS (irritable bowel syndrome)    Impingement syndrome of shoulder, right 06/24/2017   Midline low back pain with left-sided sciatica 12/29/2013   Musculoskeletal chest pain 05/04/2015   Obesity    Post-nasal drainage 02/10/2020   Last Assessment & Plan:  Formatting of this note might be different from the original. Concern over postnasal drainage. Throat awareness symptoms ever since she was packed in the right nostril last summer.  Was treated with antibiotics with some improvement.  Causes intermittent hoarseness and loss of voice.  Non-smoker. EXAM by anterior rhinoscopy shows no obvious polyps or purulence or obstructi   Precordial pain 05/04/2015   Primary insomnia 03/14/2015   Radiculitis of right cervical region 01/20/2018   Radiculopathy 04/02/2018   Right hand pain 06/24/2017   RLS (restless legs syndrome) 07/14/2015   Type 2 diabetes mellitus with diabetic neuropathy, with long-term current use of insulin ([place]) 02/16/2010   Qualifier: Diagnosis of  By: Koleen Nimrod MD, Jeffrey     Unstable angina [facility]) 06/11/2019    Past Surgical History:  Procedure Laterality Date   ABDOMINAL HYSTERECTOMY     ANTERIOR CERVICAL DECOMP/DISCECTOMY FUSION N/A 04/02/2018   Procedure: ANTERIOR CERVICAL DECOMPRESSION FUSION, CERVICAL FOUR-FIVE, CERVICAL FIVE-SIX, WITH INSTRUMENTATION AND ALLOGRAFT;  Surgeon: Phylliss Bob, MD;  Location: [place];  Service: Orthopedics;  Laterality: N/A;   COLONOSCOPY WITH ESOPHAGOGASTRODUODENOSCOPY (EGD)  05/04/2009   CORONARY ARTERY BYPASS GRAFT N/A 06/14/2019   Procedure: CORONARY ARTERY BYPASS GRAFTING (CABG) using LIMA to LAD; Endoscopic Right Greater Saphenous Vein Harvest: SVG to Circ (distal); SVG to RCA (distal).;   Surgeon: [name], MD;  Location: [place];  Service: Open Heart Surgery;  Laterality: N/A;   ENDOVEIN HARVEST OF GREATER SAPHENOUS VEIN Right 06/14/2019   Procedure: [place] Ropes Of Greater Saphenous Vein;  Surgeon: [name], MD;  Location: [place];  Service: Open Heart Surgery;  Laterality: Right;   LEFT HEART CATH AND CORONARY ANGIOGRAPHY N/A 06/11/2019   Procedure: LEFT HEART CATH AND CORONARY ANGIOGRAPHY;  Surgeon: Wellington Hampshire, MD;  Location: [place] CV LAB;  Service: Cardiovascular;  Laterality: N/A;   PIP JOINT FUSION Bilateral    pinky   TEE WITHOUT CARDIOVERSION N/A 06/14/2019   Procedure: TRANSESOPHAGEAL ECHOCARDIOGRAM (TEE);  Surgeon: [name], MD;  Location: [place];  Service: Open Heart Surgery;  Laterality: N/A;    There were no vitals filed for this visit.   Subjective Assessment - 01/16/21 1025     Subjective "I'm just tight as a tick" (neck).  Pt reports the DN helped for a few days.  She had a relaxing holiday, which has helped as well. No numbness in RUE, but it usually happens in afternoon after driving.    Pertinent History cervical fusion 2020, Lt rotator cuff tear    Diagnostic tests MRI shows arthritis of cervical spine    Patient Stated Goals reduce radicular symptoms    Currently in Pain? Yes    Pain Score 4     Pain Location Neck    Pain Orientation Right    Pain Descriptors / Indicators Aching;Dull    Aggravating Factors  tilt head to Rt, end range Rt rotation    Pain Relieving Factors heat              [facility] Adult PT Treatment/Exercise - 01/16/21 0001       Neck Exercises: Machines for Strengthening   Nustep L4: arms(5)/legs x 5 min      Neck Exercises: Standing   Neck Retraction 5 reps;5 secs   pressing head into noodle   Other Standing Exercises back against noodle:  scap retraction x 5 sec x 10 reps, W's with scap retraction x 5 sec x 5 reps    Other Standing Exercises bilat low row with red x 10, bilat ER with  red x 10      Neck Exercises: Seated   Cervical Rotation Right;5 reps   towel assist, 10 sec holds. cues for form.     Neck Exercises: Stretches   Upper Trapezius Stretch Left;Right;3 reps;20 seconds    Other Neck Stretches 3 position doorway stretches x 20 sec each position (high Rt unilateral) x 2 each; cues for body position.      Knee/Hip Exercises: Stretches   Gastroc Stretch Right;3 reps;20 seconds      Knee/Hip Exercises: Aerobic   Recumbent Bike L1 x 5 min, at end of session               PT Long Term Goals - 01/11/21 1143  PT LONG TERM GOAL #1   Title The patient will be indep with HEP progression.    Time 6    Period Weeks    Status On-going    Target Date 02/22/21      PT LONG TERM GOAL #2   Title pt will improve cervical rotation ROM by 10 degrees bilat to be able to drive with decreased difficulty    Time 6    Period Weeks    Status New    Target Date 02/22/21      PT LONG TERM GOAL #3   Title Pt will have improved FOTO score to at least 65    Baseline 59    Time 6    Period Weeks    Status On-going    Target Date 02/22/21      PT LONG TERM GOAL #4   Title Pt will improve postural strength to be able to read x 10 minutes without symptoms    Time 6    Period Weeks    Status New    Target Date 02/22/21                   Plan - 01/16/21 1254     Clinical Impression Statement Pt required minor cues for form of exercises.  She reported reduction of tightness and fatigue in postural muscles at end of session.  Cues to not lock knees with standing posture exercises as this stiffens Rt knee.  Updated HEP.    Personal Factors and Comorbidities Age;Fitness;Time since onset of injury/illness/exacerbation;Past/Current Experience    Examination-Activity Limitations Locomotion Level;Squat;Stairs    Examination-Participation Restrictions Art gallery manager;Yard Work;Laundry;Driving    Stability/Clinical Decision Making  Stable/Uncomplicated    Rehab Potential Good    PT Frequency 2x / week    PT Duration 6 weeks    PT Treatment/Interventions ADLs/Self Care Home Management;Aquatic Therapy;Cryotherapy;Electrical Stimulation;Iontophoresis 4mg /ml Dexamethasone;DME Instruction;Gait training;Stair training;Functional mobility training;Orthotic Fit/Training;Therapeutic activities;Therapeutic exercise;Balance training;Neuromuscular re-education;Patient/family education;Manual techniques;Passive range of motion;Dry needling;Taping;Vasopneumatic Device;Scar mobilization;Moist Heat    PT Next Visit Plan assess cervical HEP, knee and postural strengthening, manual and modalities as needed    PT Home Exercise Plan Access Code: [email or web address]    Consulted and Agree with Plan of Care Patient             Patient will benefit from skilled therapeutic intervention in order to improve the following deficits and impairments:  Decreased range of motion, Difficulty walking, Increased fascial restricitons, Increased muscle spasms, Pain, Decreased balance, Hypomobility, Improper body mechanics, Impaired flexibility, Decreased mobility, Decreased strength, Increased edema, Postural dysfunction  Visit Diagnosis: Abnormal posture  Acute pain of right knee  Muscle weakness (generalized)     Problem List Patient Active Problem List   Diagnosis Date Noted   Acute meniscal tear of right knee 11/07/2020   Grief 11/07/2020   HSV-2 (herpes simplex virus 2) infection - genital, (+)viral culture 08/2020 09/11/2020   Obesity    IBS (irritable bowel syndrome)    Hypertension    Hyperlipidemia    H/O: GI bleed    Gastrointestinal ulcer    Gastro-esophageal reflux    Diverticulosis    Depression    Colon polyps    Anxiety    Anemia    Post-nasal drainage 02/10/2020   S/P CABG x 3 LIMA to LAD, SVG sequential to RCA and circumflex in May 2021    Unstable angina ([place]) 06/11/2019   Angina pectoris ([place]) 06/04/2019   Abnormal stress  test 05/24/2019  Abnormal stress echocardiogram 05/24/2019   Radiculopathy 04/02/2018   Abnormal ECG 04/01/2018   Radiculitis of right cervical region 01/20/2018   Carpal tunnel syndrome, right 12/16/2017   Right hand pain 06/24/2017   Impingement syndrome of shoulder, right 06/24/2017   Dupuytren's contracture of right hand 06/24/2017   Bilateral carpal tunnel syndrome 05/23/2017   Depression, major, in partial remission ([place]) 07/14/2015   RLS (restless legs syndrome) 07/14/2015   Atherosclerosis of native coronary artery with angina pectoris ([place]) 05/10/2015   Musculoskeletal chest pain 05/04/2015   Precordial pain 05/04/2015   Generalized anxiety disorder 03/14/2015   Primary insomnia 03/14/2015   Type 2 diabetes mellitus with diabetic neuropathy, with long-term current use of insulin ([place]) 02/16/2010   Hyperlipidemia LDL goal <70 02/16/2010   Essential hypertension 02/16/2010   GERD 02/16/2010   Bilateral knee pain 02/16/2010  Kerin Perna, PTA 01/16/21 12:59 PM  [place] [address] [place], [name], [address] Phone: [phone]   Fax:  [phone]  Name: Shadara Lopez MRN: [id number] Date of Birth: 1960/11/25

## 2021-01-16 NOTE — Patient Instructions (Signed)
Access Code: [email or web address] URL: [email or web address] Date: 01/16/2021 Prepared by: [facility]  Exercises Prone Quadriceps Stretch with Strap - 1 x daily - 7 x weekly - 2 sets - 30 sec hold Seated Hamstring Stretch - 1 x daily - 7 x weekly - 2 sets - 30 sec hold Wall Quarter Squat - 1 x daily - 7 x weekly - 2 sets - 10 reps Prone Hamstring Curl with Anchored Resistance - 1 x daily - 7 x weekly - 2 sets - 10 reps Straight Leg Raise with Resistance - 1 x daily - 7 x weekly - 2 sets - 10 reps Side Stepping with Resistance at Ankles - 1 x daily - 7 x weekly - 2 sets - 10 reps Lateral Step Down - 1 x daily - 7 x weekly - 2 sets - 10 reps Staggered Sit-to-Stand - 1 x daily - 7 x weekly - 3 sets - 10 reps Sidelying Hip Adduction - 1 x daily - 7 x weekly - 2 sets - 10 reps Seated Assisted Cervical Rotation with Towel - 1 x daily - 7 x weekly - 1 sets - 10 reps - 10 seconds hold Seated Scapular Retraction - 1 x daily - 7 x weekly - 2 sets - 10 reps Seated Upper Trapezius Stretch - 1 x daily - 7 x weekly - 1 sets - 2 reps - 20-30 seconds hold Seated Scalene Stretch with Towel - 1 x daily - 7 x weekly - 1 sets - 2-3 reps - 15 seconds hold Seated Shoulder W - 1 x daily - 7 x weekly - 1 sets - 5 reps - 5 seconds hold Single Arm Doorway Pec Stretch at 120 Degrees Abduction - 1 x daily - 7 x weekly - 1 sets - 2-3 reps - 15 seconds hold

## 2021-01-19 ENCOUNTER — Ambulatory Visit (INDEPENDENT_AMBULATORY_CARE_PROVIDER_SITE_OTHER): Payer: BC Managed Care – PPO | Admitting: Physical Therapy

## 2021-01-19 ENCOUNTER — Other Ambulatory Visit: Payer: Self-pay

## 2021-01-19 DIAGNOSIS — M6281 Muscle weakness (generalized): Secondary | ICD-10-CM

## 2021-01-19 DIAGNOSIS — M25561 Pain in right knee: Secondary | ICD-10-CM

## 2021-01-19 DIAGNOSIS — R293 Abnormal posture: Secondary | ICD-10-CM

## 2021-01-19 NOTE — Therapy (Addendum)
[place], [name], [address] Phone: [phone]   Fax:  [phone]  Physical Therapy Treatment  Patient Details  Name: Tonya Chandler MRN: [id number] Date of Birth: 1960/07/11 Referring Provider (PT): Phylliss Bob / Frederik Pear   Encounter Date: 01/19/2021   PT End of Session - 01/19/21 1055     Visit Number 14    Number of Visits 24    Date for PT Re-Evaluation 02/22/21    Authorization Type BCBS    PT Start Time 1015    PT Stop Time 1105    PT Time Calculation (min) 50 min    Activity Tolerance Patient tolerated treatment well    Behavior During Therapy [facility] for tasks assessed/performed             Past Medical History:  Diagnosis Date   Abnormal ECG 04/01/2018   Abnormal stress echocardiogram 05/24/2019   Formatting of this note might be different from the original. Added automatically from request for surgery [phone]   Abnormal stress test 05/24/2019   Formatting of this note might be different from the original. Added automatically from request for surgery [id number]   Anemia    Angina pectoris ([place]) 06/04/2019   Anxiety    Atherosclerosis of native coronary artery with angina pectoris ([place]) 05/10/2015   Formatting of this note might be different from the original. 30% lesion-Chiu   Bilateral carpal tunnel syndrome 05/23/2017   Bilateral knee pain 02/16/2010   Qualifier: Diagnosis of  By: Koleen Nimrod MD, Jeffrey     Carpal tunnel syndrome, right 12/16/2017   Colon polyps    Depression    DEPRESSION 02/16/2010   Qualifier: Diagnosis of  By: Koleen Nimrod MD, Dellis Filbert     Depression, major, in partial remission ([place]) 07/14/2015   Diabetes mellitus    Diverticulosis    Dupuytren's contracture of right hand 06/24/2017   Essential hypertension 02/16/2010   Qualifier: Diagnosis of  By: Koleen Nimrod MD, Dellis Filbert     Gastro-esophageal reflux    Gastrointestinal ulcer    Generalized anxiety disorder 03/14/2015    GERD 02/16/2010   Qualifier: Diagnosis of  By: Koleen Nimrod MD, Dellis Filbert     H/O: GI bleed    Said it was due to taking metformin   Hyperlipidemia    Hyperlipidemia LDL goal <70 02/16/2010   Qualifier: Diagnosis of  By: Koleen Nimrod MD, Dellis Filbert     Hypertension    IBS (irritable bowel syndrome)    Impingement syndrome of shoulder, right 06/24/2017   Midline low back pain with left-sided sciatica 12/29/2013   Musculoskeletal chest pain 05/04/2015   Obesity    Post-nasal drainage 02/10/2020   Last Assessment & Plan:  Formatting of this note might be different from the original. Concern over postnasal drainage. Throat awareness symptoms ever since she was packed in the right nostril last summer.  Was treated with antibiotics with some improvement.  Causes intermittent hoarseness and loss of voice.  Non-smoker. EXAM by anterior rhinoscopy shows no obvious polyps or purulence or obstructi   Precordial pain 05/04/2015   Primary insomnia 03/14/2015   Radiculitis of right cervical region 01/20/2018   Radiculopathy 04/02/2018   Right hand pain 06/24/2017   RLS (restless legs syndrome) 07/14/2015   Type 2 diabetes mellitus with diabetic neuropathy, with long-term current use of insulin ([place]) 02/16/2010   Qualifier: Diagnosis of  By: Koleen Nimrod MD, Jeffrey     Unstable angina [facility]) 06/11/2019    Past Surgical History:  Procedure Laterality Date   ABDOMINAL HYSTERECTOMY     ANTERIOR CERVICAL DECOMP/DISCECTOMY FUSION N/A 04/02/2018   Procedure: ANTERIOR CERVICAL DECOMPRESSION FUSION, CERVICAL FOUR-FIVE, CERVICAL FIVE-SIX, WITH INSTRUMENTATION AND ALLOGRAFT;  Surgeon: Phylliss Bob, MD;  Location: [place];  Service: Orthopedics;  Laterality: N/A;   COLONOSCOPY WITH ESOPHAGOGASTRODUODENOSCOPY (EGD)  05/04/2009   CORONARY ARTERY BYPASS GRAFT N/A 06/14/2019   Procedure: CORONARY ARTERY BYPASS GRAFTING (CABG) using LIMA to LAD; Endoscopic Right Greater Saphenous Vein Harvest: SVG to Circ (distal); SVG to RCA (distal).;   Surgeon: [name], MD;  Location: [place];  Service: Open Heart Surgery;  Laterality: N/A;   ENDOVEIN HARVEST OF GREATER SAPHENOUS VEIN Right 06/14/2019   Procedure: [place]opes Of Greater Saphenous Vein;  Surgeon: [name], MD;  Location: [place];  Service: Open Heart Surgery;  Laterality: Right;   LEFT HEART CATH AND CORONARY ANGIOGRAPHY N/A 06/11/2019   Procedure: LEFT HEART CATH AND CORONARY ANGIOGRAPHY;  Surgeon: Wellington Hampshire, MD;  Location: [place] CV [facility];  Service: Cardiovascular;  Laterality: N/A;   PIP JOINT FUSION Bilateral    pinky   TEE WITHOUT CARDIOVERSION N/A 06/14/2019   Procedure: TRANSESOPHAGEAL ECHOCARDIOGRAM (TEE);  Surgeon: [name], MD;  Location: [place];  Service: Open Heart Surgery;  Laterality: N/A;    There were no vitals filed for this visit.   Subjective Assessment - 01/19/21 1024     Subjective "I was so sore the next day (in neck) after PT", up to 6/10 - used heated massager for relief.    Pertinent History cervical fusion 2020, Lt rotator cuff tear    Patient Stated Goals reduce radicular symptoms    Currently in Pain? Yes    Pain Score 4     Pain Location Neck    Pain Orientation Right    Pain Descriptors / Indicators Aching;Dull    Aggravating Factors  tilting head to Rt, end range Rt rotation    Pain Relieving Factors heat    Multiple Pain Sites Yes    Pain Score 2    Pain Location Knee    Pain Descriptors / Indicators Aching;Dull    Aggravating Factors  side stepping; pivoting on Rt leg                [facility] PT Assessment - 01/19/21 0001       Assessment   Medical Diagnosis neck painZ98.890 (ICD-10-CM) - S/P right knee arthroscopy    Referring Provider (PT) Phylliss Bob / Frederik Pear    Hand Dominance Left    Prior Therapy Prior to surgery               [facility] Adult PT Treatment/Exercise - 01/19/21 0001       Neck Exercises: Machines for Strengthening   Nustep L5: arms(5)/legs x 5 min       Neck Exercises: Standing   Other Standing Exercises back against noodle:  bilat shoulder ER with red band x 20 reps    Other Standing Exercises bilat low row with green x 15 reps, 3-5 sec hold in retraction      Neck Exercises: Stretches   Upper Trapezius Stretch Left;Right;3 reps;20 seconds    Other Neck Stretches middle and high doorway stretch x 15 sec, x 2  each (high performed unilaterally)    Other Neck Stretches pec stretch with arm abdct 90 deg on wall, turning feet away x 20 sec each arm.  supine UE nerve glides with arm off  of table with wrist flex/ext      Knee/Hip Exercises: Stretches   Gastroc Stretch Right;3 reps;20 seconds      Moist Heat Therapy   Number Minutes Moist Heat 10 Minutes    Moist Heat Location Cervical;Shoulder      Manual Therapy   Manual Therapy --   DN provided by April Nonato, PT   Manual therapy comments skilled palpation to assess affects of dry needling    Soft tissue mobilization STM upper traps, cervical paraspinals bilat              Trigger Point Dry Needling - 01/19/21 0001     Consent Given? Yes    Education Handout Provided Previously provided    Muscles Treated Head and Neck Upper trapezius   Rt   Muscles Treated Upper Quadrant Pectoralis major;Pectoralis minor    Upper Trapezius Response Palpable increased muscle length;Twitch reponse elicited    Cervical multifidi Response Palpable increased muscle length;Twitch reponse elicited    Pectoralis Major Response Twitch response elicited;Palpable increased muscle length    Pectoralis Minor Response Twitch response elicited;Palpable increased muscle length                PT Long Term Goals - 01/11/21 1143       PT LONG TERM GOAL #1   Title The patient will be indep with HEP progression.    Time 6    Period Weeks    Status On-going    Target Date 02/22/21      PT LONG TERM GOAL #2   Title pt will improve cervical rotation ROM by 10 degrees bilat to be able to drive with  decreased difficulty    Time 6    Period Weeks    Status New    Target Date 02/22/21      PT LONG TERM GOAL #3   Title Pt will have improved FOTO score to at least 65    Baseline 59    Time 6    Period Weeks    Status On-going    Target Date 02/22/21      PT LONG TERM GOAL #4   Title Pt will improve postural strength to be able to read x 10 minutes without symptoms    Time 6    Period Weeks    Status New    Target Date 02/22/21                   Plan - 01/19/21 1055     Clinical Impression Statement Pt tolerated exercises well, with minor increase in neck discomfort during Rt upper trap stretch.  Palpable reduction of tightness in Rt upper quarter with DN (provided by supervising PT, April Nonato). Focus of session has moved more towards the neck, instead of the Rt knee.  goals are ongoing.    Personal Factors and Comorbidities Age;Fitness;Time since onset of injury/illness/exacerbation;Past/Current Experience    Examination-Activity Limitations Locomotion Level;Squat;Stairs    Examination-Participation Restrictions Art gallery manager;Yard Work;Laundry;Driving    Stability/Clinical Decision Making Stable/Uncomplicated    Rehab Potential Good    PT Frequency 2x / week    PT Duration 6 weeks    PT Treatment/Interventions ADLs/Self Care Home Management;Aquatic Therapy;Cryotherapy;Electrical Stimulation;Iontophoresis 4mg /ml Dexamethasone;DME Instruction;Gait training;Stair training;Functional mobility training;Orthotic Fit/Training;Therapeutic activities;Therapeutic exercise;Balance training;Neuromuscular re-education;Patient/family education;Manual techniques;Passive range of motion;Dry needling;Taping;Vasopneumatic Device;Scar mobilization;Moist Heat    PT Next Visit Plan progress cervical HEP, knee and postural strengthening, manual and modalities as needed    PT Home  Exercise Plan Access Code: [email or web address]    Consulted and Agree with Plan of Care Patient              Patient will benefit from skilled therapeutic intervention in order to improve the following deficits and impairments:  Decreased range of motion, Difficulty walking, Increased fascial restricitons, Increased muscle spasms, Pain, Decreased balance, Hypomobility, Improper body mechanics, Impaired flexibility, Decreased mobility, Decreased strength, Increased edema, Postural dysfunction  Visit Diagnosis: Abnormal posture  Acute pain of right knee  Muscle weakness (generalized)     Problem List Patient Active Problem List   Diagnosis Date Noted   Acute meniscal tear of right knee 11/07/2020   Grief 11/07/2020   HSV-2 (herpes simplex virus 2) infection - genital, (+)viral culture 08/2020 09/11/2020   Obesity    IBS (irritable bowel syndrome)    Hypertension    Hyperlipidemia    H/O: GI bleed    Gastrointestinal ulcer    Gastro-esophageal reflux    Diverticulosis    Depression    Colon polyps    Anxiety    Anemia    Post-nasal drainage 02/10/2020   S/P CABG x 3 LIMA to LAD, SVG sequential to RCA and circumflex in May 2021    Unstable angina ([place]) 06/11/2019   Angina pectoris ([place]) 06/04/2019   Abnormal stress test 05/24/2019   Abnormal stress echocardiogram 05/24/2019   Radiculopathy 04/02/2018   Abnormal ECG 04/01/2018   Radiculitis of right cervical region 01/20/2018   Carpal tunnel syndrome, right 12/16/2017   Right hand pain 06/24/2017   Impingement syndrome of shoulder, right 06/24/2017   Dupuytren's contracture of right hand 06/24/2017   Bilateral carpal tunnel syndrome 05/23/2017   Depression, major, in partial remission ([place]) 07/14/2015   RLS (restless legs syndrome) 07/14/2015   Atherosclerosis of native coronary artery with angina pectoris ([place]) 05/10/2015   Musculoskeletal chest pain 05/04/2015   Precordial pain 05/04/2015   Generalized anxiety disorder 03/14/2015   Primary insomnia 03/14/2015   Type 2 diabetes mellitus with diabetic neuropathy,  with long-term current use of insulin ([place]) 02/16/2010   Hyperlipidemia LDL goal <70 02/16/2010   Essential hypertension 02/16/2010   GERD 02/16/2010   Bilateral knee pain 02/16/2010   Kerin Perna, PTA 01/19/21 10:59 AM  Estill Bamberg April Gordy Levan, PT, DPT 01/19/2021 11:41 AM  [facility] [place], [name], [address] Phone: [phone]   Fax:  [phone]  Name: Tonya Chandler MRN: [id number] Date of Birth: 08-23-1960

## 2021-01-24 ENCOUNTER — Ambulatory Visit: Payer: BC Managed Care – PPO | Attending: Orthopedic Surgery | Admitting: Physical Therapy

## 2021-01-24 ENCOUNTER — Other Ambulatory Visit: Payer: Self-pay

## 2021-01-24 DIAGNOSIS — M542 Cervicalgia: Secondary | ICD-10-CM | POA: Insufficient documentation

## 2021-01-24 DIAGNOSIS — M6281 Muscle weakness (generalized): Secondary | ICD-10-CM

## 2021-01-24 DIAGNOSIS — R293 Abnormal posture: Secondary | ICD-10-CM

## 2021-01-24 NOTE — Therapy (Signed)
[place], [name], [address] Phone: [phone]   Fax:  [phone]  Physical Therapy Treatment  Patient Details  Name: Tonya Chandler MRN: [id number] Date of Birth: May 13, 1960 Referring Provider (PT): Phylliss Bob / Frederik Pear   Encounter Date: 01/24/2021   PT End of Session - 01/24/21 1554     Visit Number 15    Number of Visits 24    Date for PT Re-Evaluation 02/22/21    PT Start Time 7741    PT Stop Time 1554    PT Time Calculation (min) 39 min    Activity Tolerance Patient tolerated treatment well    Behavior During Therapy [facility] for tasks assessed/performed             Past Medical History:  Diagnosis Date   Abnormal ECG 04/01/2018   Abnormal stress echocardiogram 05/24/2019   Formatting of this note might be different from the original. Added automatically from request for surgery [id number]   Abnormal stress test 05/24/2019   Formatting of this note might be different from the original. Added automatically from request for surgery [id number]   Anemia    Angina pectoris ([place]) 06/04/2019   Anxiety    Atherosclerosis of native coronary artery with angina pectoris ([place]) 05/10/2015   Formatting of this note might be different from the original. 30% lesion-Chiu   Bilateral carpal tunnel syndrome 05/23/2017   Bilateral knee pain 02/16/2010   Qualifier: Diagnosis of  By: Koleen Nimrod MD, Jeffrey     Carpal tunnel syndrome, right 12/16/2017   Colon polyps    Depression    DEPRESSION 02/16/2010   Qualifier: Diagnosis of  By: Koleen Nimrod MD, Dellis Filbert     Depression, major, in partial remission ([place]) 07/14/2015   Diabetes mellitus    Diverticulosis    Dupuytren's contracture of right hand 06/24/2017   Essential hypertension 02/16/2010   Qualifier: Diagnosis of  By: Koleen Nimrod MD, Dellis Filbert     Gastro-esophageal reflux    Gastrointestinal ulcer    Generalized anxiety disorder 03/14/2015   GERD 02/16/2010   Qualifier:  Diagnosis of  By: Koleen Nimrod MD, Dellis Filbert     H/O: GI bleed    Said it was due to taking metformin   Hyperlipidemia    Hyperlipidemia LDL goal <70 02/16/2010   Qualifier: Diagnosis of  By: Koleen Nimrod MD, Dellis Filbert     Hypertension    IBS (irritable bowel syndrome)    Impingement syndrome of shoulder, right 06/24/2017   Midline low back pain with left-sided sciatica 12/29/2013   Musculoskeletal chest pain 05/04/2015   Obesity    Post-nasal drainage 02/10/2020   Last Assessment & Plan:  Formatting of this note might be different from the original. Concern over postnasal drainage. Throat awareness symptoms ever since she was packed in the right nostril last summer.  Was treated with antibiotics with some improvement.  Causes intermittent hoarseness and loss of voice.  Non-smoker. EXAM by anterior rhinoscopy shows no obvious polyps or purulence or obstructi   Precordial pain 05/04/2015   Primary insomnia 03/14/2015   Radiculitis of right cervical region 01/20/2018   Radiculopathy 04/02/2018   Right hand pain 06/24/2017   RLS (restless legs syndrome) 07/14/2015   Type 2 diabetes mellitus with diabetic neuropathy, with long-term current use of insulin ([place]) 02/16/2010   Qualifier: Diagnosis of  By: Koleen Nimrod MD, Jeffrey     Unstable angina [facility]) 06/11/2019    Past Surgical History:  Procedure Laterality Date   ABDOMINAL  HYSTERECTOMY     ANTERIOR CERVICAL DECOMP/DISCECTOMY FUSION N/A 04/02/2018   Procedure: ANTERIOR CERVICAL DECOMPRESSION FUSION, CERVICAL FOUR-FIVE, CERVICAL FIVE-SIX, WITH INSTRUMENTATION AND ALLOGRAFT;  Surgeon: Phylliss Bob, MD;  Location: [place];  Service: Orthopedics;  Laterality: N/A;   COLONOSCOPY WITH ESOPHAGOGASTRODUODENOSCOPY (EGD)  05/04/2009   CORONARY ARTERY BYPASS GRAFT N/A 06/14/2019   Procedure: CORONARY ARTERY BYPASS GRAFTING (CABG) using LIMA to LAD; Endoscopic Right Greater Saphenous Vein Harvest: SVG to Circ (distal); SVG to RCA (distal).;  Surgeon: [name], MD;   Location: [place];  Service: Open Heart Surgery;  Laterality: N/A;   ENDOVEIN HARVEST OF GREATER SAPHENOUS VEIN Right 06/14/2019   Procedure: [place] Ropes Of Greater Saphenous Vein;  Surgeon: [name], MD;  Location: [place];  Service: Open Heart Surgery;  Laterality: Right;   LEFT HEART CATH AND CORONARY ANGIOGRAPHY N/A 06/11/2019   Procedure: LEFT HEART CATH AND CORONARY ANGIOGRAPHY;  Surgeon: Wellington Hampshire, MD;  Location: [place] CV LAB;  Service: Cardiovascular;  Laterality: N/A;   PIP JOINT FUSION Bilateral    pinky   TEE WITHOUT CARDIOVERSION N/A 06/14/2019   Procedure: TRANSESOPHAGEAL ECHOCARDIOGRAM (TEE);  Surgeon: [name], MD;  Location: [place];  Service: Open Heart Surgery;  Laterality: N/A;    There were no vitals filed for this visit.   Subjective Assessment - 01/24/21 1513     Subjective Pt states she is "a little better than last time". She feels her neck is "stiff" and "tender to the touch"    Patient Stated Goals reduce radicular symptoms    Currently in Pain? Yes    Pain Score 3     Pain Location Neck    Pain Orientation Right    Pain Descriptors / Indicators Tightness                [facility] PT Assessment - 01/24/21 0001       Assessment   Medical Diagnosis neck painZ98.890 (ICD-10-CM) - S/P right knee arthroscopy    Referring Provider (PT) Lynann Bologna, Elta [place] / Frederik Pear    Hand Dominance Left                           [facility] Adult PT Treatment/Exercise - 01/24/21 0001       Neck Exercises: Machines for Strengthening   Nustep L5: arms(5)/legs x 5 min      Neck Exercises: Standing   Wall Wash wall angels with noodle along back x 10    Other Standing Exercises back against noodle:  bilat shoulder ER with red band x 20 reps, back against wall alt shld flexion x 20    Other Standing Exercises bilat low row green TB x 20, cues for posture      Neck Exercises: Stretches   Other Neck Stretches middle and high doorway  stretch x 15 sec, x 2  each (high performed unilaterally)    Other Neck Stretches pec stretch with UE on wall x 20 sec bilat      Manual Therapy   Manual Therapy Joint mobilization    Manual therapy comments skilled palpation to assess affects of dry needling    Joint Mobilization PAs upper thoracic    Soft tissue mobilization STM upper trap, thoracic and cervical paraspinals              Trigger Point Dry Needling - 01/24/21 0001     Consent Given? Yes    Education  Handout Provided Previously provided    Muscles Treated Head and Neck Levator scapulae    Muscles Treated Back/Hip Thoracic multifidi    Upper Trapezius Response Palpable increased muscle length    Levator Scapulae Response Palpable increased muscle length    Cervical multifidi Response Palpable increased muscle length    Thoracic multifidi response Palpable increased muscle length                        PT Long Term Goals - 01/11/21 1143       PT LONG TERM GOAL #1   Title The patient will be indep with HEP progression.    Time 6    Period Weeks    Status On-going    Target Date 02/22/21      PT LONG TERM GOAL #2   Title pt will improve cervical rotation ROM by 10 degrees bilat to be able to drive with decreased difficulty    Time 6    Period Weeks    Status New    Target Date 02/22/21      PT LONG TERM GOAL #3   Title Pt will have improved FOTO score to at least 65    Baseline 59    Time 6    Period Weeks    Status On-going    Target Date 02/22/21      PT LONG TERM GOAL #4   Title Pt will improve postural strength to be able to read x 10 minutes without symptoms    Time 6    Period Weeks    Status New    Target Date 02/22/21                   Plan - 01/24/21 1554     Clinical Impression Statement Pt with good tolerance to addition of standing shoulder flexion and wall angels with cuing for posture. continues with good response to manual therapy    PT Next Visit Plan  progress HEP, postural strength, manual and modalities    PT Home Exercise Plan Access Code: [email or web address]    Consulted and Agree with Plan of Care Patient             Patient will benefit from skilled therapeutic intervention in order to improve the following deficits and impairments:     Visit Diagnosis: Abnormal posture  Muscle weakness (generalized)     Problem List Patient Active Problem List   Diagnosis Date Noted   Acute meniscal tear of right knee 11/07/2020   Grief 11/07/2020   HSV-2 (herpes simplex virus 2) infection - genital, (+)viral culture 08/2020 09/11/2020   Obesity    IBS (irritable bowel syndrome)    Hypertension    Hyperlipidemia    H/O: GI bleed    Gastrointestinal ulcer    Gastro-esophageal reflux    Diverticulosis    Depression    Colon polyps    Anxiety    Anemia    Post-nasal drainage 02/10/2020   S/P CABG x 3 LIMA to LAD, SVG sequential to RCA and circumflex in May 2021    Unstable angina ([place]) 06/11/2019   Angina pectoris ([place]) 06/04/2019   Abnormal stress test 05/24/2019   Abnormal stress echocardiogram 05/24/2019   Radiculopathy 04/02/2018   Abnormal ECG 04/01/2018   Radiculitis of right cervical region 01/20/2018   Carpal tunnel syndrome, right 12/16/2017   Right hand pain 06/24/2017   Impingement syndrome of shoulder, right 06/24/2017  Dupuytren's contracture of right hand 06/24/2017   Bilateral carpal tunnel syndrome 05/23/2017   Depression, major, in partial remission ([place]) 07/14/2015   RLS (restless legs syndrome) 07/14/2015   Atherosclerosis of native coronary artery with angina pectoris ([place]) 05/10/2015   Musculoskeletal chest pain 05/04/2015   Precordial pain 05/04/2015   Generalized anxiety disorder 03/14/2015   Primary insomnia 03/14/2015   Type 2 diabetes mellitus with diabetic neuropathy, with long-term current use of insulin ([place]) 02/16/2010   Hyperlipidemia LDL goal <70 02/16/2010   Essential hypertension 02/16/2010    GERD 02/16/2010   Bilateral knee pain 02/16/2010    Gianina Olinde, PT 01/24/2021, 3:56 PM  [facility] [place], [name], [address] Phone: [phone]   Fax:  [phone]  Name: Tonya Chandler MRN: [id number] Date of Birth: 11/29/1960

## 2021-01-26 ENCOUNTER — Other Ambulatory Visit: Payer: Self-pay

## 2021-01-26 ENCOUNTER — Ambulatory Visit: Payer: BC Managed Care – PPO | Admitting: Physical Therapy

## 2021-01-26 DIAGNOSIS — M542 Cervicalgia: Secondary | ICD-10-CM | POA: Diagnosis not present

## 2021-01-26 DIAGNOSIS — R293 Abnormal posture: Secondary | ICD-10-CM

## 2021-01-26 DIAGNOSIS — M6281 Muscle weakness (generalized): Secondary | ICD-10-CM

## 2021-01-26 NOTE — Patient Instructions (Signed)
Access Code: [email or web address] URL: [email or web address] Date: 01/26/2021 Prepared by: Isabelle Course  Exercises Prone Quadriceps Stretch with Strap - 1 x daily - 7 x weekly - 2 sets - 30 sec hold Seated Hamstring Stretch - 1 x daily - 7 x weekly - 2 sets - 30 sec hold Wall Quarter Squat - 1 x daily - 7 x weekly - 2 sets - 10 reps Prone Hamstring Curl with Anchored Resistance - 1 x daily - 7 x weekly - 2 sets - 10 reps Straight Leg Raise with Resistance - 1 x daily - 7 x weekly - 2 sets - 10 reps Side Stepping with Resistance at Ankles - 1 x daily - 7 x weekly - 2 sets - 10 reps Lateral Step Down - 1 x daily - 7 x weekly - 2 sets - 10 reps Staggered Sit-to-Stand - 1 x daily - 7 x weekly - 3 sets - 10 reps Sidelying Hip Adduction - 1 x daily - 7 x weekly - 2 sets - 10 reps Seated Assisted Cervical Rotation with Towel - 1 x daily - 7 x weekly - 1 sets - 10 reps - 10 seconds hold Seated Upper Trapezius Stretch - 1 x daily - 7 x weekly - 1 sets - 2 reps - 20-30 seconds hold Seated Scalene Stretch with Towel - 1 x daily - 7 x weekly - 1 sets - 2-3 reps - 15 seconds hold Single Arm Doorway Pec Stretch at 120 Degrees Abduction - 1 x daily - 7 x weekly - 1 sets - 2-3 reps - 15 seconds hold Wall Angels - 1 x daily - 7 x weekly - 2 sets - 10 reps Alternating Shoulder Flexion at Wall with Foam Roller - 1 x daily - 7 x weekly - 2 sets - 10 reps Shoulder PNF D2 Flexion - 1 x daily - 7 x weekly - 1 sets - 10 reps

## 2021-01-26 NOTE — Therapy (Signed)
[place], [name], [address] Phone: [phone]   Fax:  [phone]  Physical Therapy Treatment  Patient Details  Name: Tonya Chandler MRN: [id number] Date of Birth: 1960/04/21 Referring Provider (PT): Phylliss Bob / Frederik Pear   Encounter Date: 01/26/2021   PT End of Session - 01/26/21 0923     Visit Number 16    Number of Visits 24    Date for PT Re-Evaluation 02/22/21    PT Start Time 0842    PT Stop Time 0926    PT Time Calculation (min) 44 min    Activity Tolerance Patient tolerated treatment well    Behavior During Therapy [facility] for tasks assessed/performed             Past Medical History:  Diagnosis Date   Abnormal ECG 04/01/2018   Abnormal stress echocardiogram 05/24/2019   Formatting of this note might be different from the original. Added automatically from request for surgery [id number]   Abnormal stress test 05/24/2019   Formatting of this note might be different from the original. Added automatically from request for surgery [id number]   Anemia    Angina pectoris ([place]) 06/04/2019   Anxiety    Atherosclerosis of native coronary artery with angina pectoris ([place]) 05/10/2015   Formatting of this note might be different from the original. 30% lesion-Chiu   Bilateral carpal tunnel syndrome 05/23/2017   Bilateral knee pain 02/16/2010   Qualifier: Diagnosis of  By: Koleen Nimrod MD, Jeffrey     Carpal tunnel syndrome, right 12/16/2017   Colon polyps    Depression    DEPRESSION 02/16/2010   Qualifier: Diagnosis of  By: Koleen Nimrod MD, Dellis Filbert     Depression, major, in partial remission ([place]) 07/14/2015   Diabetes mellitus    Diverticulosis    Dupuytren's contracture of right hand 06/24/2017   Essential hypertension 02/16/2010   Qualifier: Diagnosis of  By: Koleen Nimrod MD, Dellis Filbert     Gastro-esophageal reflux    Gastrointestinal ulcer    Generalized anxiety disorder 03/14/2015   GERD 02/16/2010   Qualifier:  Diagnosis of  By: Koleen Nimrod MD, Dellis Filbert     H/O: GI bleed    Said it was due to taking metformin   Hyperlipidemia    Hyperlipidemia LDL goal <70 02/16/2010   Qualifier: Diagnosis of  By: Koleen Nimrod MD, Dellis Filbert     Hypertension    IBS (irritable bowel syndrome)    Impingement syndrome of shoulder, right 06/24/2017   Midline low back pain with left-sided sciatica 12/29/2013   Musculoskeletal chest pain 05/04/2015   Obesity    Post-nasal drainage 02/10/2020   Last Assessment & Plan:  Formatting of this note might be different from the original. Concern over postnasal drainage. Throat awareness symptoms ever since she was packed in the right nostril last summer.  Was treated with antibiotics with some improvement.  Causes intermittent hoarseness and loss of voice.  Non-smoker. EXAM by anterior rhinoscopy shows no obvious polyps or purulence or obstructi   Precordial pain 05/04/2015   Primary insomnia 03/14/2015   Radiculitis of right cervical region 01/20/2018   Radiculopathy 04/02/2018   Right hand pain 06/24/2017   RLS (restless legs syndrome) 07/14/2015   Type 2 diabetes mellitus with diabetic neuropathy, with long-term current use of insulin ([place]) 02/16/2010   Qualifier: Diagnosis of  By: Koleen Nimrod MD, Jeffrey     Unstable angina [facility]) 06/11/2019    Past Surgical History:  Procedure Laterality Date   ABDOMINAL  HYSTERECTOMY     ANTERIOR CERVICAL DECOMP/DISCECTOMY FUSION N/A 04/02/2018   Procedure: ANTERIOR CERVICAL DECOMPRESSION FUSION, CERVICAL FOUR-FIVE, CERVICAL FIVE-SIX, WITH INSTRUMENTATION AND ALLOGRAFT;  Surgeon: Phylliss Bob, MD;  Location: [place];  Service: Orthopedics;  Laterality: N/A;   COLONOSCOPY WITH ESOPHAGOGASTRODUODENOSCOPY (EGD)  05/04/2009   CORONARY ARTERY BYPASS GRAFT N/A 06/14/2019   Procedure: CORONARY ARTERY BYPASS GRAFTING (CABG) using LIMA to LAD; Endoscopic Right Greater Saphenous Vein Harvest: SVG to Circ (distal); SVG to RCA (distal).;  Surgeon: [name], MD;   Location: [place];  Service: Open Heart Surgery;  Laterality: N/A;   ENDOVEIN HARVEST OF GREATER SAPHENOUS VEIN Right 06/14/2019   Procedure: [place] Of Greater Saphenous Vein;  Surgeon: [name], MD;  Location: [place];  Service: Open Heart Surgery;  Laterality: Right;   LEFT HEART CATH AND CORONARY ANGIOGRAPHY N/A 06/11/2019   Procedure: LEFT HEART CATH AND CORONARY ANGIOGRAPHY;  Surgeon: Wellington Hampshire, MD;  Location: [place] CV LAB;  Service: Cardiovascular;  Laterality: N/A;   PIP JOINT FUSION Bilateral    pinky   TEE WITHOUT CARDIOVERSION N/A 06/14/2019   Procedure: TRANSESOPHAGEAL ECHOCARDIOGRAM (TEE);  Surgeon: [name], MD;  Location: [place];  Service: Open Heart Surgery;  Laterality: N/A;    There were no vitals filed for this visit.   Subjective Assessment - 01/26/21 0838     Subjective Pt states she is very sore after driving for 9 hours yesterday    Patient Stated Goals reduce radicular symptoms    Currently in Pain? Yes    Pain Score 5     Pain Location Neck    Pain Orientation Right;Left    Pain Descriptors / Indicators Sore                               [facility] Adult PT Treatment/Exercise - 01/26/21 0001       Neck Exercises: Machines for Strengthening   Nustep L5: arms(5)/legs x 5 min      Neck Exercises: Standing   Wall Wash wall angels x 10    Other Standing Exercises back against noodle:  bilat shoulder ER with red band x 20 reps, back against wall alt shld flexion x 20, diagonals red TB x 10    Other Standing Exercises bow and arrow red TB x 10 bilat      Moist Heat Therapy   Number Minutes Moist Heat 10 Minutes    Moist Heat Location Cervical;Shoulder      Manual Therapy   Joint Mobilization PAs upper thoracic    Soft tissue mobilization STM upper trap, thoracic and cervical paraspinals                     PT Education - 01/26/21 0923     Education Details updated HEP    Person(s) Educated  Patient    Methods Explanation;Demonstration;Handout    Comprehension Verbalized understanding;Returned demonstration                 PT Long Term Goals - 01/11/21 1143       PT LONG TERM GOAL #1   Title The patient will be indep with HEP progression.    Time 6    Period Weeks    Status On-going    Target Date 02/22/21      PT LONG TERM GOAL #2   Title pt will improve cervical rotation ROM  by 10 degrees bilat to be able to drive with decreased difficulty    Time 6    Period Weeks    Status New    Target Date 02/22/21      PT LONG TERM GOAL #3   Title Pt will have improved FOTO score to at least 65    Baseline 59    Time 6    Period Weeks    Status On-going    Target Date 02/22/21      PT LONG TERM GOAL #4   Title Pt will improve postural strength to be able to read x 10 minutes without symptoms    Time 6    Period Weeks    Status New    Target Date 02/22/21                   Plan - 01/26/21 7096     Clinical Impression Statement Pt with good tolerance to addition of bow and arrow and PNF diagonals today. She states good stretch with diagonals with wall against noodle. Manual work focused on thoracic joint and soft tissue mobility. HEP updated    PT Next Visit Plan posture    PT Home Exercise Plan Access Code: [email or web address]    Consulted and Agree with Plan of Care Patient             Patient will benefit from skilled therapeutic intervention in order to improve the following deficits and impairments:     Visit Diagnosis: Abnormal posture  Muscle weakness (generalized)     Problem List Patient Active Problem List   Diagnosis Date Noted   Acute meniscal tear of right knee 11/07/2020   Grief 11/07/2020   HSV-2 (herpes simplex virus 2) infection - genital, (+)viral culture 08/2020 09/11/2020   Obesity    IBS (irritable bowel syndrome)    Hypertension    Hyperlipidemia    H/O: GI bleed    Gastrointestinal ulcer    Gastro-esophageal  reflux    Diverticulosis    Depression    Colon polyps    Anxiety    Anemia    Post-nasal drainage 02/10/2020   S/P CABG x 3 LIMA to LAD, SVG sequential to RCA and circumflex in May 2021    Unstable angina ([place]) 06/11/2019   Angina pectoris ([place]) 06/04/2019   Abnormal stress test 05/24/2019   Abnormal stress echocardiogram 05/24/2019   Radiculopathy 04/02/2018   Abnormal ECG 04/01/2018   Radiculitis of right cervical region 01/20/2018   Carpal tunnel syndrome, right 12/16/2017   Right hand pain 06/24/2017   Impingement syndrome of shoulder, right 06/24/2017   Dupuytren's contracture of right hand 06/24/2017   Bilateral carpal tunnel syndrome 05/23/2017   Depression, major, in partial remission ([place]) 07/14/2015   RLS (restless legs syndrome) 07/14/2015   Atherosclerosis of native coronary artery with angina pectoris ([place]) 05/10/2015   Musculoskeletal chest pain 05/04/2015   Precordial pain 05/04/2015   Generalized anxiety disorder 03/14/2015   Primary insomnia 03/14/2015   Type 2 diabetes mellitus with diabetic neuropathy, with long-term current use of insulin ([place]) 02/16/2010   Hyperlipidemia LDL goal <70 02/16/2010   Essential hypertension 02/16/2010   GERD 02/16/2010   Bilateral knee pain 02/16/2010    Tonya Chandler, PT 01/26/2021, 9:25 AM  [facility] [place], [name], [address] Phone: [phone]   Fax:  [phone]  Name: Tonya Chandler MRN: [id number] Date of Birth: 11/16/1960

## 2021-01-30 ENCOUNTER — Other Ambulatory Visit: Payer: Self-pay

## 2021-01-30 ENCOUNTER — Ambulatory Visit: Payer: BC Managed Care – PPO | Admitting: Physical Therapy

## 2021-01-30 DIAGNOSIS — M6281 Muscle weakness (generalized): Secondary | ICD-10-CM

## 2021-01-30 DIAGNOSIS — R293 Abnormal posture: Secondary | ICD-10-CM

## 2021-01-30 DIAGNOSIS — M542 Cervicalgia: Secondary | ICD-10-CM | POA: Diagnosis not present

## 2021-01-30 NOTE — Therapy (Addendum)
[place], [name], [address] Phone: [phone]   Fax:  [phone]  Physical Therapy Treatment and Discharge  Patient Details  Name: Tonya Chandler MRN: [id number] Date of Birth: February 23, 1960 Referring Provider (PT): Phylliss Bob / Frederik Pear   Encounter Date: 01/30/2021   PT End of Session - 01/30/21 0846     Visit Number 17    Number of Visits 24    Date for PT Re-Evaluation 02/22/21    PT Start Time 0846    PT Stop Time 0930    PT Time Calculation (min) 44 min    Activity Tolerance Patient tolerated treatment well    Behavior During Therapy [facility] for tasks assessed/performed             Past Medical History:  Diagnosis Date   Abnormal ECG 04/01/2018   Abnormal stress echocardiogram 05/24/2019   Formatting of this note might be different from the original. Added automatically from request for surgery [id number]   Abnormal stress test 05/24/2019   Formatting of this note might be different from the original. Added automatically from request for surgery [id number]   Anemia    Angina pectoris ([place]) 06/04/2019   Anxiety    Atherosclerosis of native coronary artery with angina pectoris ([place]) 05/10/2015   Formatting of this note might be different from the original. 30% lesion-Chiu   Bilateral carpal tunnel syndrome 05/23/2017   Bilateral knee pain 02/16/2010   Qualifier: Diagnosis of  By: Koleen Nimrod MD, Jeffrey     Carpal tunnel syndrome, right 12/16/2017   Colon polyps    Depression    DEPRESSION 02/16/2010   Qualifier: Diagnosis of  By: Koleen Nimrod MD, Dellis Filbert     Depression, major, in partial remission ([place]) 07/14/2015   Diabetes mellitus    Diverticulosis    Dupuytren's contracture of right hand 06/24/2017   Essential hypertension 02/16/2010   Qualifier: Diagnosis of  By: Koleen Nimrod MD, Dellis Filbert     Gastro-esophageal reflux    Gastrointestinal ulcer    Generalized anxiety disorder 03/14/2015   GERD 02/16/2010    Qualifier: Diagnosis of  By: Koleen Nimrod MD, Dellis Filbert     H/O: GI bleed    Said it was due to taking metformin   Hyperlipidemia    Hyperlipidemia LDL goal <70 02/16/2010   Qualifier: Diagnosis of  By: Koleen Nimrod MD, Dellis Filbert     Hypertension    IBS (irritable bowel syndrome)    Impingement syndrome of shoulder, right 06/24/2017   Midline low back pain with left-sided sciatica 12/29/2013   Musculoskeletal chest pain 05/04/2015   Obesity    Post-nasal drainage 02/10/2020   Last Assessment & Plan:  Formatting of this note might be different from the original. Concern over postnasal drainage. Throat awareness symptoms ever since she was packed in the right nostril last summer.  Was treated with antibiotics with some improvement.  Causes intermittent hoarseness and loss of voice.  Non-smoker. EXAM by anterior rhinoscopy shows no obvious polyps or purulence or obstructi   Precordial pain 05/04/2015   Primary insomnia 03/14/2015   Radiculitis of right cervical region 01/20/2018   Radiculopathy 04/02/2018   Right hand pain 06/24/2017   RLS (restless legs syndrome) 07/14/2015   Type 2 diabetes mellitus with diabetic neuropathy, with long-term current use of insulin ([place]) 02/16/2010   Qualifier: Diagnosis of  By: Koleen Nimrod MD, Jeffrey     Unstable angina [facility]) 06/11/2019    Past Surgical History:  Procedure Laterality Date  ABDOMINAL HYSTERECTOMY     ANTERIOR CERVICAL DECOMP/DISCECTOMY FUSION N/A 04/02/2018   Procedure: ANTERIOR CERVICAL DECOMPRESSION FUSION, CERVICAL FOUR-FIVE, CERVICAL FIVE-SIX, WITH INSTRUMENTATION AND ALLOGRAFT;  Surgeon: Phylliss Bob, MD;  Location: [place];  Service: Orthopedics;  Laterality: N/A;   COLONOSCOPY WITH ESOPHAGOGASTRODUODENOSCOPY (EGD)  05/04/2009   CORONARY ARTERY BYPASS GRAFT N/A 06/14/2019   Procedure: CORONARY ARTERY BYPASS GRAFTING (CABG) using LIMA to LAD; Endoscopic Right Greater Saphenous Vein Harvest: SVG to Circ (distal); SVG to RCA (distal).;  Surgeon: [name], MD;  Location: [place];  Service: Open Heart Surgery;  Laterality: N/A;   ENDOVEIN HARVEST OF GREATER SAPHENOUS VEIN Right 06/14/2019   Procedure: [place] Of Greater Saphenous Vein;  Surgeon: [name], MD;  Location: [place];  Service: Open Heart Surgery;  Laterality: Right;   LEFT HEART CATH AND CORONARY ANGIOGRAPHY N/A 06/11/2019   Procedure: LEFT HEART CATH AND CORONARY ANGIOGRAPHY;  Surgeon: Wellington Hampshire, MD;  Location: [place] CV LAB;  Service: Cardiovascular;  Laterality: N/A;   PIP JOINT FUSION Bilateral    pinky   TEE WITHOUT CARDIOVERSION N/A 06/14/2019   Procedure: TRANSESOPHAGEAL ECHOCARDIOGRAM (TEE);  Surgeon: [name], MD;  Location: [place];  Service: Open Heart Surgery;  Laterality: N/A;    There were no vitals filed for this visit.   Subjective Assessment - 01/30/21 0847     Subjective Pt reports she can barely turn her neck today.  This started Saturday; unsure what caused this.  She's been using massager, heat, muscle relaxers with minimal relief.  She complains of mild weakness in her Rt knee, but less buckling.    Pertinent History cervical fusion 2020, Lt rotator cuff tear    Currently in Pain? Yes    Pain Score 7     Pain Location Neck    Pain Orientation Right    Pain Descriptors / Indicators Tightness;Sore;Sharp    Aggravating Factors  any movement of head    Pain Relieving Factors ?                [facility] PT Assessment - 01/30/21 0001       Assessment   Medical Diagnosis neck painZ98.890 (ICD-10-CM) - S/P right knee arthroscopy    Referring Provider (PT) Lynann Bologna, Mark / Frederik Pear    Hand Dominance Left      AROM   Cervical Flexion 20    Cervical Extension 40    Cervical - Right Side Bend 15    Cervical - Left Side Bend 22               [facility] Adult PT Treatment/Exercise - 01/30/21 0001       Neck Exercises: Machines for Strengthening   UBE (Upper Arm Bike) L1: 1.5 min each direction; seated.       Neck Exercises: Seated   Cervical Isometrics Right lateral flexion;5 secs;Left lateral flexion;5 reps;Right rotation;Left rotation    Neck Retraction 10 reps;5 secs    Cervical Rotation Right;Left;5 reps    Lateral Flexion Right;Left;5 reps    Shoulder Rolls Backwards;Forwards;5 reps    Other Seated Exercise row x 10 reps, 3 sec hold, green band.  bilat shoulder ER with red band x 15 reps.      Neck Exercises: Stretches   Other Neck Stretches middle and low doorway stretch x 15 sec, x 2  each; cues for positioning.      Knee/Hip Exercises: Aerobic   Other Aerobic single laps in  between exercises to decrease stiffness.      Modalities   Modalities Electrical Stimulation;Moist Heat      Moist Heat Therapy   Number Minutes Moist Heat 10 Minutes    Moist Heat Location Cervical      Electrical Stimulation   Electrical Stimulation Location Rt cervical paraspinals, upper trap, levator    Electrical Stimulation Action IFC    Electrical Stimulation Parameters 10 min, intensity to tolerance    Electrical Stimulation Goals Pain                          PT Long Term Goals - 01/11/21 1143       PT LONG TERM GOAL #1   Title The patient will be indep with HEP progression.    Time 6    Period Weeks    Status On-going    Target Date 02/22/21      PT LONG TERM GOAL #2   Title pt will improve cervical rotation ROM by 10 degrees bilat to be able to drive with decreased difficulty    Time 6    Period Weeks    Status New    Target Date 02/22/21      PT LONG TERM GOAL #3   Title Pt will have improved FOTO score to at least 65    Baseline 59    Time 6    Period Weeks    Status On-going    Target Date 02/22/21      PT LONG TERM GOAL #4   Title Pt will improve postural strength to be able to read x 10 minutes without symptoms    Time 6    Period Weeks    Status New    Target Date 02/22/21                   Plan - 01/30/21 1232     Clinical Impression  Statement Pt experiencing flare up of neck symptoms since last visit.  Cervical ROM limited today.  She tolerated isometrics for neck well, but not AROM.  She reported reduction of pain after use of estim/MHP at end of session.  No new goals met this session.    Rehab Potential Good    PT Frequency 2x / week    PT Duration 6 weeks    PT Treatment/Interventions ADLs/Self Care Home Management;Aquatic Therapy;Cryotherapy;Electrical Stimulation;Iontophoresis 44m/ml Dexamethasone;DME Instruction;Gait training;Stair training;Functional mobility training;Orthotic Fit/Training;Therapeutic activities;Therapeutic exercise;Balance training;Neuromuscular re-education;Patient/family education;Manual techniques;Passive range of motion;Dry needling;Taping;Vasopneumatic Device;Scar mobilization;Moist Heat    PT Next Visit Plan postural strengthening.    PT Home Exercise Plan Access Code: [email or web address]    Consulted and Agree with Plan of Care Patient             Patient will benefit from skilled therapeutic intervention in order to improve the following deficits and impairments:  Decreased range of motion, Difficulty walking, Increased fascial restricitons, Increased muscle spasms, Pain, Decreased balance, Hypomobility, Improper body mechanics, Impaired flexibility, Decreased mobility, Decreased strength, Increased edema, Postural dysfunction  Visit Diagnosis: Abnormal posture  Muscle weakness (generalized)     Problem List Patient Active Problem List   Diagnosis Date Noted   Acute meniscal tear of right knee 11/07/2020   Grief 11/07/2020   HSV-2 (herpes simplex virus 2) infection - genital, (+)viral culture 08/2020 09/11/2020   Obesity    IBS (irritable bowel syndrome)    Hypertension    Hyperlipidemia    H/O: GI bleed  Gastrointestinal ulcer    Gastro-esophageal reflux    Diverticulosis    Depression    Colon polyps    Anxiety    Anemia    Post-nasal drainage 02/10/2020   S/P CABG x 3 LIMA  to LAD, SVG sequential to RCA and circumflex in May 2021    Unstable angina ([place]) 06/11/2019   Angina pectoris ([place]) 06/04/2019   Abnormal stress test 05/24/2019   Abnormal stress echocardiogram 05/24/2019   Radiculopathy 04/02/2018   Abnormal ECG 04/01/2018   Radiculitis of right cervical region 01/20/2018   Carpal tunnel syndrome, right 12/16/2017   Right hand pain 06/24/2017   Impingement syndrome of shoulder, right 06/24/2017   Dupuytren's contracture of right hand 06/24/2017   Bilateral carpal tunnel syndrome 05/23/2017   Depression, major, in partial remission ([place]) 07/14/2015   RLS (restless legs syndrome) 07/14/2015   Atherosclerosis of native coronary artery with angina pectoris ([place]) 05/10/2015   Musculoskeletal chest pain 05/04/2015   Precordial pain 05/04/2015   Generalized anxiety disorder 03/14/2015   Primary insomnia 03/14/2015   Type 2 diabetes mellitus with diabetic neuropathy, with long-term current use of insulin ([place]) 02/16/2010   Hyperlipidemia LDL goal <70 02/16/2010   Essential hypertension 02/16/2010   GERD 02/16/2010   Bilateral knee pain 02/16/2010  PHYSICAL THERAPY DISCHARGE SUMMARY  Visits from Start of Care: 17  Current functional level related to goals / functional outcomes: Improved knee strength and mobility, decreased pain   Remaining deficits: Neck pain, decreased AROM   Education / Equipment: HEP   Patient agrees to discharge. Patient goals were partially met. Patient is being discharged due to a change in medical status.  Isabelle Course, PT,DPT02/03/2308:46 AM   Kerin Perna, PTA 01/30/21 12:38 PM   [place], [name], [address] Phone: [phone]   Fax:  [phone]  Name: Tonya Chandler MRN: [id number] Date of Birth: 1960/09/18

## 2021-02-02 ENCOUNTER — Ambulatory Visit: Payer: BC Managed Care – PPO | Admitting: Physical Therapy

## 2021-02-05 ENCOUNTER — Other Ambulatory Visit: Payer: Self-pay

## 2021-02-05 ENCOUNTER — Ambulatory Visit (INDEPENDENT_AMBULATORY_CARE_PROVIDER_SITE_OTHER): Payer: BC Managed Care – PPO | Admitting: Physician Assistant

## 2021-02-05 VITALS — BP 170/102 | HR 74 | Ht 60.0 in | Wt 164.0 lb

## 2021-02-05 DIAGNOSIS — E1140 Type 2 diabetes mellitus with diabetic neuropathy, unspecified: Secondary | ICD-10-CM

## 2021-02-05 DIAGNOSIS — Z01818 Encounter for other preprocedural examination: Secondary | ICD-10-CM

## 2021-02-05 DIAGNOSIS — Z794 Long term (current) use of insulin: Secondary | ICD-10-CM

## 2021-02-05 DIAGNOSIS — R079 Chest pain, unspecified: Secondary | ICD-10-CM

## 2021-02-05 DIAGNOSIS — A6004 Herpesviral vulvovaginitis: Secondary | ICD-10-CM

## 2021-02-05 LAB — CBC WITH DIFFERENTIAL/PLATELET
Absolute Monocytes: 543 cells/uL (ref 200–950)
Basophils Absolute: 41 cells/uL (ref 0–200)
Basophils Relative: 0.5 %
Eosinophils Absolute: 373 cells/uL (ref 15–500)
Eosinophils Relative: 4.6 %
HCT: 45.2 % — ABNORMAL HIGH (ref 35.0–45.0)
Hemoglobin: 15.1 g/dL (ref 11.7–15.5)
Lymphs Abs: 2624 cells/uL (ref 850–3900)
MCH: 28.9 pg (ref 27.0–33.0)
MCHC: 33.4 g/dL (ref 32.0–36.0)
MCV: 86.6 fL (ref 80.0–100.0)
MPV: 11.3 fL (ref 7.5–12.5)
Monocytes Relative: 6.7 %
Neutro Abs: 4520 cells/uL (ref 1500–7800)
Neutrophils Relative %: 55.8 %
Platelets: 315 10*3/uL (ref 140–400)
RBC: 5.22 10*6/uL — ABNORMAL HIGH (ref 3.80–5.10)
RDW: 13 % (ref 11.0–15.0)
Total Lymphocyte: 32.4 %
WBC: 8.1 10*3/uL (ref 3.8–10.8)

## 2021-02-05 LAB — COMPLETE METABOLIC PANEL WITH GFR
AG Ratio: 1.7 (calc) (ref 1.0–2.5)
ALT: 15 U/L (ref 6–29)
AST: 19 U/L (ref 10–35)
Albumin: 4.5 g/dL (ref 3.6–5.1)
Alkaline phosphatase (APISO): 104 U/L (ref 37–153)
BUN: 10 mg/dL (ref 7–25)
CO2: 35 mmol/L — ABNORMAL HIGH (ref 20–32)
Calcium: 9.7 mg/dL (ref 8.6–10.4)
Chloride: 101 mmol/L (ref 98–110)
Creat: 0.61 mg/dL (ref 0.50–1.05)
Globulin: 2.6 g/dL (calc) (ref 1.9–3.7)
Glucose, Bld: 131 mg/dL — ABNORMAL HIGH (ref 65–99)
Potassium: 4.5 mmol/L (ref 3.5–5.3)
Sodium: 141 mmol/L (ref 135–146)
Total Bilirubin: 0.4 mg/dL (ref 0.2–1.2)
Total Protein: 7.1 g/dL (ref 6.1–8.1)
eGFR: 101 mL/min/{1.73_m2} (ref >=60)

## 2021-02-05 LAB — POCT GLYCOSYLATED HEMOGLOBIN (HGB A1C): Hemoglobin A1C: 8.4 % — AB (ref 4.0–5.6)

## 2021-02-05 MED ORDER — FREESTYLE LIBRE 14 DAY SENSOR MISC: 1.0000 "application " | 11 refills | Status: DC

## 2021-02-05 MED ORDER — VALACYCLOVIR HCL 500 MG PO TABS: 500.0000 mg | ORAL_TABLET | Freq: Two times a day (BID) | ORAL | 1 refills | Status: AC

## 2021-02-05 MED ORDER — TRULICITY 4.5 MG/0.5ML ~~LOC~~ SOAJ: 4.5000 mg | SUBCUTANEOUS | 0 refills | Status: DC

## 2021-02-05 MED ORDER — DAPAGLIFLOZIN PROPANEDIOL 10 MG PO TABS: ORAL_TABLET | ORAL | 0 refills | Status: DC

## 2021-02-05 NOTE — Patient Instructions (Addendum)
Push morning sugars down to closer around 100. Increase night time insulin by 2 units or until 100 units at bedtime.  Stay on all other medications Increase lisinopril 10mg  nurse visit in 2 weeks

## 2021-02-05 NOTE — Progress Notes (Signed)
Subjective:    Patient ID: Tonya Chandler, female    DOB: 1960/08/27, 61 y.o.   MRN: [id number]  HPI Pt is a 60 yo obese female with HTN, T2DM, HLD, HSV-2, GERD, GAD who presents to the clinic for follow up and medication refills.   She is checking her sugar but did not bring in log today. She is using sliding scale for insulin at meal times. She is getting around 130 most mornings. She has noticed a definate drop in sugars since increasing trulicity. Pt denies any hypoglycemic events. No open sores or wounds. She is not exercising or following a diet.   She does complain of intermittent chest pain. Has had this before and now intermittently for last few weeks. Not used nitroglycerin. Not associated with exertion. No edema or SOB. No headaches. Has cardiologist. Hx of CABG May 2021.   .. Active Ambulatory Problems    Diagnosis Date Noted   Type 2 diabetes mellitus with diabetic neuropathy, with long-term current use of insulin ([place]) 02/16/2010   Essential hypertension 02/16/2010   GERD 02/16/2010   Bilateral knee pain 02/16/2010   Impingement syndrome of shoulder, right 06/24/2017   Depression, major, in partial remission ([place]) 07/14/2015   Generalized anxiety disorder 03/14/2015   Primary insomnia 03/14/2015   RLS (restless legs syndrome) 07/14/2015   Angina pectoris ([place]) 06/04/2019   S/P CABG x 3 LIMA to LAD, SVG sequential to RCA and circumflex in May 2021    Obesity    IBS (irritable bowel syndrome)    Hypertension    Hyperlipidemia    H/O: GI bleed    Gastrointestinal ulcer    Gastro-esophageal reflux    Diverticulosis    Depression    Anxiety    Post-nasal drainage 02/10/2020   HSV-2 (herpes simplex virus 2) infection - genital, (+)viral culture 08/2020 09/11/2020   Acute meniscal tear of right knee 11/07/2020   Grief 11/07/2020   Resolved Ambulatory Problems    Diagnosis Date Noted   Hyperlipidemia LDL goal <70 02/16/2010   DEPRESSION 02/16/2010   Right hand pain  06/24/2017   Dupuytren's contracture of right hand 06/24/2017   Carpal tunnel syndrome, right 12/16/2017   Radiculitis of right cervical region 01/20/2018   Radiculopathy 04/02/2018   Abnormal ECG 04/01/2018   Abnormal stress test 05/24/2019   Atherosclerosis of native coronary artery with angina pectoris ([place]) 05/10/2015   Midline low back pain with left-sided sciatica 12/29/2013   Musculoskeletal chest pain 05/04/2015   Precordial pain 05/04/2015   Bilateral carpal tunnel syndrome 05/23/2017   Unstable angina ([place]) 06/11/2019   Colon polyps    Anemia    Abnormal stress echocardiogram 05/24/2019   Past Medical History:  Diagnosis Date   Diabetes mellitus       Review of Systems See HPI.     Objective:   Physical Exam Vitals reviewed.  Constitutional:      Appearance: Normal appearance. She is obese.  HENT:     Head: Normocephalic.  Neck:     Vascular: No carotid bruit.  Cardiovascular:     Rate and Rhythm: Normal rate and regular rhythm.     Pulses: Normal pulses.     Heart sounds: Normal heart sounds.  Pulmonary:     Effort: Pulmonary effort is normal.     Breath sounds: Normal breath sounds.  Musculoskeletal:     Right lower leg: No edema.     Left lower leg: No edema.  Lymphadenopathy:  Cervical: No cervical adenopathy.  Neurological:     General: No focal deficit present.     Mental Status: She is alert and oriented to person, place, and time.  Psychiatric:        Mood and Affect: Mood normal.     .. Results for orders placed or performed in visit on 02/05/21  CBC with Differential  Result Value Ref Range   WBC 8.1 3.8 - 10.8 Thousand/uL   RBC 5.22 (H) 3.80 - 5.10 Million/uL   Hemoglobin 15.1 11.7 - 15.5 g/dL   HCT 45.2 (H) 35.0 - 45.0 %   MCV 86.6 80.0 - 100.0 fL   MCH 28.9 27.0 - 33.0 pg   MCHC 33.4 32.0 - 36.0 g/dL   RDW 13.0 11.0 - 15.0 %   Platelets 315 140 - 400 Thousand/uL   MPV 11.3 7.5 - 12.5 fL   Neutro Abs 4,520 1,500 - 7,800  cells/uL   Lymphs Abs 2,624 850 - 3,900 cells/uL   Absolute Monocytes 543 200 - 950 cells/uL   Eosinophils Absolute 373 15 - 500 cells/uL   Basophils Absolute 41 0 - 200 cells/uL   Neutrophils Relative % 55.8 %   Total Lymphocyte 32.4 %   Monocytes Relative 6.7 %   Eosinophils Relative 4.6 %   Basophils Relative 0.5 %  COMPLETE METABOLIC PANEL WITH GFR  Result Value Ref Range   Glucose, Bld 131 (H) 65 - 99 mg/dL   BUN 10 7 - 25 mg/dL   Creat 0.61 0.50 - 1.05 mg/dL   eGFR 101 > OR = 60 mL/min/1.41m   BUN/Creatinine Ratio NOT APPLICABLE 6 - 22 (calc)   Sodium 141 135 - 146 mmol/L   Potassium 4.5 3.5 - 5.3 mmol/L   Chloride 101 98 - 110 mmol/L   CO2 35 (H) 20 - 32 mmol/L   Calcium 9.7 8.6 - 10.4 mg/dL   Total Protein 7.1 6.1 - 8.1 g/dL   Albumin 4.5 3.6 - 5.1 g/dL   Globulin 2.6 1.9 - 3.7 g/dL (calc)   AG Ratio 1.7 1.0 - 2.5 (calc)   Total Bilirubin 0.4 0.2 - 1.2 mg/dL   Alkaline phosphatase (APISO) 104 37 - 153 U/L   AST 19 10 - 35 U/L   ALT 15 6 - 29 U/L  POCT HgB A1C  Result Value Ref Range   Hemoglobin A1C 8.4 (A) 4.0 - 5.6 %   HbA1c POC (<> result, manual entry)     HbA1c, POC (prediabetic range)     HbA1c, POC (controlled diabetic range)          Assessment & Plan:  ..Marland KitchenWilfredwas seen today for follow-up.  Diagnoses and all orders for this visit:  Type 2 diabetes mellitus with diabetic neuropathy, with long-term current use of insulin (HCC) -     CBC with Differential -     COMPLETE METABOLIC PANEL WITH GFR -     POCT HgB A1C -     Dulaglutide (TRULICITY) 4.5 MHU/7.6LYSOPN; Inject 4.5 mg into the skin once a week. -     Continuous Blood Gluc Sensor (FREESTYLE LIBRE 14 DAY SENSOR) MISC; 1 application by Does not apply route every 14 (fourteen) days. Apply upper deltoid every 14 days, use reader to determine blood sugars  Pre-op examination  Herpes simplex vulvovaginitis -     valACYclovir (VALTREX) 500 MG tablet; Take 1 tablet (500 mg total) by mouth 2 (two)  times daily for 3 days. As needed for  outbreak  Other orders -     Discontinue: dapagliflozin propanediol (FARXIGA) 10 MG TABS tablet; TAKE 1 TABLET(10 MG) BY MOUTH DAILY  A1C has improved but still not to goal.  Will order libre to help monitor sugars more often without the sticks Increase night time insulin by 2 units until around 100 fasting in the morning.  Continue trulicity and farxiga. BP up today with CP. Make appt with cardiology for more work up. Increased lisinopril to 47m and follow up in 2 weeks.  On statin. Foot and eye exam UTD.  Needs covid booster Flu and pneumonia and shingles UTD.  Follow up in 2 weeks and 3 months.   Refilled valtrex for as needed herpetic breakout.

## 2021-02-06 NOTE — Progress Notes (Signed)
Tonya Chandler,   Kidney and liver look great.

## 2021-02-07 ENCOUNTER — Other Ambulatory Visit: Payer: Self-pay

## 2021-02-07 ENCOUNTER — Telehealth: Payer: Self-pay | Admitting: Cardiology

## 2021-02-07 ENCOUNTER — Encounter: Payer: Self-pay | Admitting: Cardiology

## 2021-02-07 ENCOUNTER — Ambulatory Visit (INDEPENDENT_AMBULATORY_CARE_PROVIDER_SITE_OTHER): Payer: BC Managed Care – PPO | Admitting: Cardiology

## 2021-02-07 VITALS — BP 136/84 | HR 81 | Ht 60.0 in | Wt 164.0 lb

## 2021-02-07 DIAGNOSIS — R079 Chest pain, unspecified: Secondary | ICD-10-CM | POA: Diagnosis not present

## 2021-02-07 DIAGNOSIS — E782 Mixed hyperlipidemia: Secondary | ICD-10-CM | POA: Diagnosis not present

## 2021-02-07 DIAGNOSIS — Z951 Presence of aortocoronary bypass graft: Secondary | ICD-10-CM

## 2021-02-07 DIAGNOSIS — I209 Angina pectoris, unspecified: Secondary | ICD-10-CM | POA: Diagnosis not present

## 2021-02-07 DIAGNOSIS — I10 Essential (primary) hypertension: Secondary | ICD-10-CM | POA: Diagnosis not present

## 2021-02-07 MED ORDER — CLOPIDOGREL BISULFATE 75 MG PO TABS: 75.0000 mg | ORAL_TABLET | Freq: Every day | ORAL | 3 refills | Status: AC

## 2021-02-07 MED ORDER — ATORVASTATIN CALCIUM 80 MG PO TABS
80.0000 mg | ORAL_TABLET | Freq: Every day | ORAL | 3 refills | Status: AC
Start: 2021-02-07 — End: ?

## 2021-02-07 MED ORDER — RANOLAZINE ER 500 MG PO TB12: 500.0000 mg | ORAL_TABLET | Freq: Two times a day (BID) | ORAL | 3 refills | Status: AC

## 2021-02-07 NOTE — Patient Instructions (Signed)
Medication Instructions:  Your physician has recommended you make the following change in your medication:   START: Ranolazine 500 mg twice daily START: Lipitor 80 mg daily  *If you need a refill on your cardiac medications before your next appointment, please call your pharmacy*   Lab Work: Your physician recommends that you return for lab work in: Labs today: Troponin 1, CBC If you have labs (blood work) drawn today and your tests are completely normal, you will receive your results only by: [place] (if you have MyChart) OR A paper copy in the mail If you have any lab test that is abnormal or we need to change your treatment, we will call you to review the results.   Testing/Procedures: Your physician has requested that you have a lexiscan myoview.   The test will take approximately 3 to 4 hours to complete; you may bring reading material.  If someone comes with you to your appointment, they will need to remain in the main lobby due to limited space in the testing area. **If you are pregnant or breastfeeding, please notify the nuclear lab prior to your appointment**  How to prepare for your Myocardial Perfusion Test: Do not eat or drink 3 hours prior to your test, except you may have water. Do not consume products containing caffeine (regular or decaffeinated) 12 hours prior to your test. (ex: coffee, chocolate, sodas, tea). Do bring a list of your current medications with you.  If not listed below, you may take your medications as normal. Do wear comfortable clothes (no dresses or overalls) and walking shoes, tennis shoes preferred (No heels or open toe shoes are allowed). Do NOT wear cologne, perfume, aftershave, or lotions (deodorant is allowed). If these instructions are not followed, your test will have to be rescheduled.    Follow-Up: At [facility], you and your health needs are our priority.  As part of our continuing mission to provide you with exceptional heart  care, we have created designated Provider Care Teams.  These Care Teams include your primary Cardiologist (physician) and Advanced Practice Providers (APPs -  Physician Assistants and Nurse Practitioners) who all work together to provide you with the care you need, when you need it.  We recommend signing up for the patient portal called "MyChart".  Sign up information is provided on this After Visit Summary.  MyChart is used to connect with patients for Virtual Visits (Telemedicine).  Patients are able to view lab/test results, encounter notes, upcoming appointments, etc.  Non-urgent messages can be sent to your provider as well.   To learn more about what you can do with MyChart, go to [email or web address].    Your next appointment:   2 month(s)  The format for your next appointment:   In Person  Provider:   Jenne Campus, MD    Other Instructions None

## 2021-02-07 NOTE — Telephone Encounter (Signed)
Called patient to find out more information regarding her chest pain. Patient stated that she had talked to another nurse about her chest pain and had scheduled an appointment to see Dr. Agustin Cree today.

## 2021-02-07 NOTE — Telephone Encounter (Signed)
Pt c/o of Chest Pain: STAT if CP now or developed within 24 hours  1. Are you having CP right now? Yes, mild  2. Are you experiencing any other symptoms (ex. SOB, nausea, vomiting, sweating)? no  3. How long have you been experiencing CP? Started last week or week before  4. Is your CP continuous or coming and going? Comes and goes  5. Have you taken Nitroglycerin? No   Patient states she has been having chest pain for about 2 weeks. She says she is having it now, but it is more mild. She says she went to her PCP recently and her BP was also high so they increased her lisinopril. She says when she got back home her BP was back down.  ?

## 2021-02-07 NOTE — Progress Notes (Signed)
Cardiology Office Note:    Date:  02/07/2021   ID:  Tonya Chandler, DOB 22-May-1960, MRN [id number]  PCP:  Donella Stade, PA-C  Cardiologist:  Jenne Campus, MD    Referring MD: Emeterio Reeve, DO   Chief Complaint  Patient presents with   Chest Pain   Hypertension    History of Present Illness:    Tonya Chandler is a 61 y.o. female   with past medical history significant for coronary artery disease status post coronary bypass graft done in May 2021 with LIMA to LAD, SVG to RCA, SVG to circumflex, diabetes which was poorly controlled, essential hypertension, dyslipidemia. She comes today to my office for follow-up.  For about 2 to 3 weeks she experienced chest pain it happened a different situation.  It can happen when she sits it can happen when she walks last for few minutes she described this as a pressure on the left side.  It does not happen with exertion however.  She said this is something similar to that she had before her open heart surgery except much more mild.  She did not try nitroglycerin for it.  She went to her primary care physician she was find to have elevated blood pressure and recently the dose of lisinopril has been increased which is a very right move.  Past Medical History:  Diagnosis Date   Abnormal ECG 04/01/2018   Abnormal stress echocardiogram 05/24/2019   Formatting of this note might be different from the original. Added automatically from request for surgery [id number]   Abnormal stress test 05/24/2019   Formatting of this note might be different from the original. Added automatically from request for surgery [id number]   Anemia    Angina pectoris ([place]) 06/04/2019   Anxiety    Atherosclerosis of native coronary artery with angina pectoris ([place]) 05/10/2015   Formatting of this note might be different from the original. 30% lesion-[place]   Bilateral carpal tunnel syndrome 05/23/2017   Bilateral knee pain 02/16/2010   Qualifier: Diagnosis of  By: Koleen Nimrod MD, [place]      Carpal tunnel syndrome, right 12/16/2017   Colon polyps    Depression    DEPRESSION 02/16/2010   Qualifier: Diagnosis of  By: Koleen Nimrod MD, Dellis Filbert     Depression, major, in partial remission ([place]) 07/14/2015   Diabetes mellitus    Diverticulosis    Dupuytren's contracture of right hand 06/24/2017   Essential hypertension 02/16/2010   Qualifier: Diagnosis of  By: Koleen Nimrod MD, Dellis Filbert     Gastro-esophageal reflux    Gastrointestinal ulcer    Generalized anxiety disorder 03/14/2015   GERD 02/16/2010   Qualifier: Diagnosis of  By: Koleen Nimrod MD, Dellis Filbert     H/O: GI bleed    Said it was due to taking metformin   Hyperlipidemia    Hyperlipidemia LDL goal <70 02/16/2010   Qualifier: Diagnosis of  By: Koleen Nimrod MD, Dellis Filbert     Hypertension    IBS (irritable bowel syndrome)    Impingement syndrome of shoulder, right 06/24/2017   Midline low back pain with left-sided sciatica 12/29/2013   Musculoskeletal chest pain 05/04/2015   Obesity    Post-nasal drainage 02/10/2020   Last Assessment & Plan:  Formatting of this note might be different from the original. Concern over postnasal drainage. Throat awareness symptoms ever since she was packed in the right nostril last summer.  Was treated with antibiotics with some improvement.  Causes intermittent hoarseness and loss of voice.  Non-smoker. EXAM by  anterior rhinoscopy shows no obvious polyps or purulence or obstructi   Precordial pain 05/04/2015   Primary insomnia 03/14/2015   Radiculitis of right cervical region 01/20/2018   Radiculopathy 04/02/2018   Right hand pain 06/24/2017   RLS (restless legs syndrome) 07/14/2015   Type 2 diabetes mellitus with diabetic neuropathy, with long-term current use of insulin ([place]) 02/16/2010   Qualifier: Diagnosis of  By: Koleen Nimrod MD, Dellis Filbert     Unstable angina [facility]) 06/11/2019    Past Surgical History:  Procedure Laterality Date   ABDOMINAL HYSTERECTOMY     ANTERIOR CERVICAL DECOMP/DISCECTOMY FUSION N/A 04/02/2018    Procedure: ANTERIOR CERVICAL DECOMPRESSION FUSION, CERVICAL FOUR-FIVE, CERVICAL FIVE-SIX, WITH INSTRUMENTATION AND ALLOGRAFT;  Surgeon: Phylliss Bob, MD;  Location: [place];  Service: Orthopedics;  Laterality: N/A;   COLONOSCOPY WITH ESOPHAGOGASTRODUODENOSCOPY (EGD)  05/04/2009   CORONARY ARTERY BYPASS GRAFT N/A 06/14/2019   Procedure: CORONARY ARTERY BYPASS GRAFTING (CABG) using LIMA to LAD; Endoscopic Right Greater Saphenous Vein Harvest: SVG to Circ (distal); SVG to RCA (distal).;  Surgeon: [name], MD;  Location: [place];  Service: Open Heart Surgery;  Laterality: N/A;   ENDOVEIN HARVEST OF GREATER SAPHENOUS VEIN Right 06/14/2019   Procedure: [place] Of Greater Saphenous Vein;  Surgeon: [name], MD;  Location: [place];  Service: Open Heart Surgery;  Laterality: Right;   LEFT HEART CATH AND CORONARY ANGIOGRAPHY N/A 06/11/2019   Procedure: LEFT HEART CATH AND CORONARY ANGIOGRAPHY;  Surgeon: Wellington [place], MD;  Location: [place] CV LAB;  Service: Cardiovascular;  Laterality: N/A;   PIP JOINT FUSION Bilateral    pinky   TEE WITHOUT CARDIOVERSION N/A 06/14/2019   Procedure: TRANSESOPHAGEAL ECHOCARDIOGRAM (TEE);  Surgeon: [name], MD;  Location: [place];  Service: Open Heart Surgery;  Laterality: N/A;    Current Medications: Current Meds  Medication Sig   acetaminophen (TYLENOL) 325 MG tablet Take 2 tablets (650 mg total) by mouth every 6 (six) hours as needed for mild pain.   aspirin EC 81 MG tablet Take 81 mg by mouth daily. Swallow whole.   atorvastatin (LIPITOR) 40 MG tablet Take 40 mg by mouth daily.   celecoxib (CELEBREX) 200 MG capsule Take 100 mg by mouth 2 (two) times daily as needed for moderate pain or mild pain.   Continuous Blood Gluc Sensor (FREESTYLE LIBRE 14 DAY SENSOR) MISC 1 application by Does not apply route every 14 (fourteen) days. Apply upper deltoid every 14 days, use reader to determine blood sugars   Cyanocobalamin (VITAMIN B-12)  2500 MCG SUBL Place 2,500 mcg under the tongue daily.   cyclobenzaprine (FLEXERIL) 10 MG tablet TAKE 1 TABLET(10 MG) BY MOUTH AT BEDTIME   dapagliflozin propanediol (FARXIGA) 10 MG TABS tablet Take 10 mg by mouth daily.   Dulaglutide (TRULICITY) 4.5 QB/3.4LP SOPN Inject 4.5 mg into the skin once a week.   DULoxetine (CYMBALTA) 60 MG capsule Take 60 mg by mouth daily.   estradiol (ESTRACE) 1 MG tablet Take 1 mg by mouth daily.   Famotidine 20 MG CHEW Chew 20 mg by mouth daily as needed (reflux).    gabapentin (NEURONTIN) 800 MG tablet Take 800 mg by mouth See admin instructions. 1 tab am, 1 tab at lunch and 2 tab at bedtime   insulin glargine-yfgn (SEMGLEE, YFGN,) 100 UNIT/ML Pen Inject 60-100 Units into the skin daily. Target FBG 120   insulin lispro (HUMALOG KWIKPEN) 200 UNIT/ML KwikPen Inject 10 Units into the skin See admin instructions. 10  units at lunch and bedtime   lidocaine (XYLOCAINE) 5 % ointment Apply 1 application topically every 3 (three) hours as needed. (Patient taking differently: Apply 1 application topically every 3 (three) hours as needed for mild pain or moderate pain.)   lisinopril (ZESTRIL) 5 MG tablet Take 5 mg by mouth in the morning and at bedtime.   loteprednol (LOTEMAX) 0.5 % ophthalmic suspension Place 1 drop into the right eye daily as needed (dry eye).    magnesium oxide (MAG-OX) 400 MG tablet Take 800 mg by mouth at bedtime.    metoprolol tartrate (LOPRESSOR) 25 MG tablet TAKE 1 TABLET(25 MG) BY MOUTH TWICE DAILY (Patient taking differently: Take 25 mg by mouth 2 (two) times daily.)   mirtazapine (REMERON) 45 MG tablet Take 45 mg by mouth at bedtime.   nitroGLYCERIN (NITROSTAT) 0.4 MG SL tablet Place 1 tablet (0.4 mg total) under the tongue every 5 (five) minutes as needed for chest pain.   omeprazole (PRILOSEC) 40 MG capsule Take 40 mg by mouth daily.   rOPINIRole (REQUIP) 3 MG tablet Take 1 tablet (3 mg total) by mouth at bedtime.   valACYclovir (VALTREX) 500 MG  tablet Take 1 tablet (500 mg total) by mouth 2 (two) times daily for 3 days. As needed for outbreak   [DISCONTINUED] gabapentin (NEURONTIN) 800 MG tablet TAKE 1 TABLET BY MOUTH IN THE MORNING, 1 TABLET AT MIDDAY AND 2 TABLETS AT BEDTIME     Allergies:   Adhesive [tape], Metformin and related, Ampicillin, Cefprozil, and Meloxicam   Social History   Socioeconomic History   Marital status: Married    Spouse name: Not on file   Number of children: 2   Years of education: Not on file   Highest education level: Not on file  Occupational History   Occupation: Press photographer REP    Employer: [employer]   Tobacco Use   Smoking status: Never   Smokeless tobacco: Never  Vaping Use   Vaping Use: Never used  Substance and Sexual Activity   Alcohol use: No   Drug use: No   Sexual activity: Yes    Partners: Male  Other Topics Concern   Not on file  Social History Narrative   Not on file   Social Determinants of Health   Financial Resource Strain: Not on file  Food Insecurity: Not on file  Transportation Needs: Not on file  Physical Activity: Not on file  Stress: Not on file  Social Connections: Not on file     Family History: [name] family history includes Colon cancer in her mother; Colon polyps (age of onset: 61) in her daughter; Dementia in her mother; Diabetes in her brother, father, and mother; Epilepsy in her brother; Heart attack in her brother, father, and mother; High blood pressure in her brother and mother; Ovarian cancer in her mother. There is no history of Esophageal cancer, Rectal cancer, Stomach cancer, or Heart disease. ROS:   Please see the history of present illness.    All 14 point review of systems negative except as described per history of present illness  EKGs/Labs/Other Studies Reviewed:      Recent Labs: 02/05/2021: ALT 15; BUN 10; Creat 0.61; Hemoglobin 15.1; Platelets 315; Potassium 4.5; Sodium 141  Recent Lipid Panel    Component Value  Date/Time   CHOL 160 06/15/2020 1518   TRIG 265 (H) 06/15/2020 1518   HDL 56 06/15/2020 1518   CHOLHDL 2.9 06/15/2020 1518   CHOLHDL 3.7 09/30/2018 1218  [place] 62 06/15/2020 1518   [place]  09/30/2018 1218     Comment:     . LDL cholesterol not calculated. Triglyceride levels greater than 400 mg/dL invalidate calculated LDL results. . Reference range: <100 . Desirable range <100 mg/dL for primary prevention;   <70 mg/dL for patients with CHD or diabetic patients  with > or = 2 CHD risk factors. Marland Kitchen LDL-C is now calculated using the Martin-Hopkins  calculation, which is a validated novel method providing  better accuracy than the Friedewald equation in the  estimation of LDL-C.  Cresenciano Genre et al. Annamaria Helling. 6195;093(26): 2061-2068  (http://education.QuestDiagnostics.com/faq/FAQ164)    LDLDIRECT 79 10/23/2020 1116    Physical Exam:    VS:  BP 136/84 (BP Location: Right Arm, Patient Position: Sitting)    Pulse 81    Ht 5' (1.524 m)    Wt 164 lb (74.4 kg)    SpO2 91%    BMI 32.03 kg/m     Wt Readings from Last 3 Encounters:  02/07/21 164 lb (74.4 kg)  02/05/21 164 lb 0.6 oz (74.4 kg)  11/06/20 167 lb 1.9 oz (75.8 kg)     GEN:  Well nourished, well developed in no acute distress HEENT: Normal NECK: No JVD; No carotid bruits LYMPHATICS: No lymphadenopathy CARDIAC: RRR, no murmurs, no rubs, no gallops RESPIRATORY:  Clear to auscultation without rales, wheezing or rhonchi  ABDOMEN: Soft, non-tender, non-distended MUSCULOSKELETAL:  No edema; No deformity  SKIN: Warm and dry LOWER EXTREMITIES: no swelling NEUROLOGIC:  Alert and oriented x 3 PSYCHIATRIC:  Normal affect   ASSESSMENT:    1. S/P CABG x 3 LIMA to LAD, SVG sequential to RCA and circumflex in May 2021   2. Essential hypertension   3. Angina pectoris ([place])   4. Mixed hyperlipidemia    PLAN:    In order of problems listed above:  Coronary artery disease status post coronary bypass graft, she started having  some chest pain.  I will ask her to start taking ranolazine 5 5 mg twice daily, I asked her to take nitroglycerin pain is not relieved by nitroglycerin, 3 nitroglycerin does not relieve the pain she is to call 911.  I will do troponin I today, I will start her on Plavix 75 mg daily on top of aspirin we will schedule him to have [place]. Essential hypertension blood pressure better controlled now we will continue present management. Angina pectoris this is [place] classification however it honestly does not happen with exercise typically.  This is difficult to judge that. Dyslipidemia she is on Lipitor 40 which I will continue.  I did review her last fasting lipid profile which show LDL 79 HDL 56.  I will increase dose of Lipitor to 80 mg daily   Medication Adjustments/Labs and Tests Ordered: Current medicines are reviewed at length with the patient today.  Concerns regarding medicines are outlined above.  No orders of the defined types were placed in this encounter.  Medication changes: No orders of the defined types were placed in this encounter.   Signed, Park Liter, MD, [facility] 02/07/2021 3:23 PM    [place]

## 2021-02-08 ENCOUNTER — Other Ambulatory Visit: Payer: Self-pay

## 2021-02-08 DIAGNOSIS — R079 Chest pain, unspecified: Secondary | ICD-10-CM

## 2021-02-08 LAB — TROPONIN T: Troponin T (Highly Sensitive): 7 ng/L (ref 0–14)

## 2021-02-08 LAB — CBC
Hematocrit: 42.9 % (ref 34.0–46.6)
Hemoglobin: 14.3 g/dL (ref 11.1–15.9)
MCH: 29.2 pg (ref 26.6–33.0)
MCHC: 33.3 g/dL (ref 31.5–35.7)
MCV: 88 fL (ref 79–97)
Platelets: 291 10*3/uL (ref 150–450)
RBC: 4.9 x10E6/uL (ref 3.77–5.28)
RDW: 13.1 % (ref 11.7–15.4)
WBC: 8 10*3/uL (ref 3.4–10.8)

## 2021-02-09 ENCOUNTER — Telehealth: Payer: Self-pay | Admitting: Cardiology

## 2021-02-09 ENCOUNTER — Telehealth: Payer: Self-pay

## 2021-02-09 MED ORDER — DAPAGLIFLOZIN PROPANEDIOL 10 MG PO TABS: 10.0000 mg | ORAL_TABLET | Freq: Every day | ORAL | 0 refills | Status: DC

## 2021-02-09 NOTE — Telephone Encounter (Signed)
-----   Message from Park Liter, MD sent at 02/09/2021 12:24 PM EST ----- Labs are looking good continue management

## 2021-02-09 NOTE — Telephone Encounter (Signed)
Returned patient call. Informed of results

## 2021-02-09 NOTE — Telephone Encounter (Signed)
Patient returning cal about lab results. Please call back

## 2021-02-09 NOTE — Progress Notes (Signed)
err

## 2021-02-11 ENCOUNTER — Other Ambulatory Visit: Payer: Self-pay | Admitting: Physician Assistant

## 2021-02-12 ENCOUNTER — Ambulatory Visit: Payer: BC Managed Care – PPO

## 2021-02-12 NOTE — Telephone Encounter (Signed)
Patient just established care with you from [facility] This is on medication list as historical, please advise on refill.

## 2021-02-12 NOTE — Telephone Encounter (Signed)
Patient informed of results.  

## 2021-02-13 ENCOUNTER — Telehealth: Payer: Self-pay | Admitting: *Deleted

## 2021-02-13 NOTE — Telephone Encounter (Signed)
Patient given detailed instructions per Myocardial Perfusion Study Information Sheet for the test on 02/20/21 at 0800. Patient notified to arrive 15 minutes early and that it is imperative to arrive on time for appointment to keep from having the test rescheduled.  If you need to cancel or reschedule your appointment, please call the office within 24 hours of your appointment. . Patient verbalized understanding.Tonya Chandler, Tonya Chandler

## 2021-02-15 ENCOUNTER — Telehealth: Payer: Self-pay

## 2021-02-16 MED ORDER — OMEPRAZOLE 40 MG PO CPDR: 40.0000 mg | DELAYED_RELEASE_CAPSULE | Freq: Every day | ORAL | 3 refills | Status: AC

## 2021-02-16 NOTE — Telephone Encounter (Signed)
Sent omeprazole.

## 2021-02-20 ENCOUNTER — Ambulatory Visit (INDEPENDENT_AMBULATORY_CARE_PROVIDER_SITE_OTHER): Payer: BC Managed Care – PPO

## 2021-02-20 ENCOUNTER — Other Ambulatory Visit: Payer: Self-pay

## 2021-02-20 DIAGNOSIS — R079 Chest pain, unspecified: Secondary | ICD-10-CM | POA: Diagnosis not present

## 2021-02-20 LAB — MYOCARDIAL PERFUSION IMAGING
LV dias vol: 76 mL (ref 46–106)
LV sys vol: 35 mL
Nuc Stress EF: 54 %
Peak HR: 83 {beats}/min
Rest HR: 69 {beats}/min
Rest Nuclear Isotope Dose: 10.6 mCi
SDS: 7
SRS: 2
SSS: 9
Stress Nuclear Isotope Dose: 30.5 mCi
TID: 1

## 2021-02-20 MED ORDER — TECHNETIUM TC 99M TETROFOSMIN IV KIT
10.6000 | PACK | Freq: Once | INTRAVENOUS | Status: AC | PRN
  Administered 2021-02-20: 10.6 via INTRAVENOUS

## 2021-02-20 MED ORDER — TECHNETIUM TC 99M TETROFOSMIN IV KIT
30.5000 | PACK | Freq: Once | INTRAVENOUS | Status: AC | PRN
  Administered 2021-02-20: 30.5 via INTRAVENOUS

## 2021-02-20 MED ORDER — REGADENOSON 0.4 MG/5ML IV SOLN
0.4000 mg | Freq: Once | INTRAVENOUS | Status: AC
  Administered 2021-02-20: 0.4 mg via INTRAVENOUS

## 2021-02-21 ENCOUNTER — Ambulatory Visit: Payer: BC Managed Care – PPO | Admitting: Cardiology

## 2021-02-22 DIAGNOSIS — M25512 Pain in left shoulder: Secondary | ICD-10-CM | POA: Diagnosis not present

## 2021-02-22 DIAGNOSIS — S40012A Contusion of left shoulder, initial encounter: Secondary | ICD-10-CM | POA: Diagnosis not present

## 2021-02-22 DIAGNOSIS — M75122 Complete rotator cuff tear or rupture of left shoulder, not specified as traumatic: Secondary | ICD-10-CM | POA: Diagnosis not present

## 2021-02-23 ENCOUNTER — Other Ambulatory Visit: Payer: Self-pay | Admitting: Physician Assistant

## 2021-03-05 DIAGNOSIS — M75122 Complete rotator cuff tear or rupture of left shoulder, not specified as traumatic: Secondary | ICD-10-CM | POA: Diagnosis not present

## 2021-03-08 ENCOUNTER — Encounter: Payer: Self-pay | Admitting: Physician Assistant

## 2021-03-09 MED ORDER — SEMAGLUTIDE (1 MG/DOSE) 4 MG/3ML ~~LOC~~ SOPN: 1.0000 mg | PEN_INJECTOR | SUBCUTANEOUS | 2 refills | Status: DC

## 2021-03-13 ENCOUNTER — Other Ambulatory Visit: Payer: Self-pay | Admitting: Neurology

## 2021-03-13 MED ORDER — DULOXETINE HCL 60 MG PO CPEP: 60.0000 mg | ORAL_CAPSULE | Freq: Every day | ORAL | 3 refills | Status: AC

## 2021-03-13 NOTE — Telephone Encounter (Signed)
Patient called asking for refills of Duloxetine.  Last written 10/18/2019 for 1 year by Dr. Sheppard Coil. Please advise.

## 2021-03-14 ENCOUNTER — Telehealth: Payer: Self-pay | Admitting: Cardiology

## 2021-03-14 NOTE — Telephone Encounter (Signed)
° °  Pre-operative Risk Assessment    Patient Name: Tonya Chandler  DOB: 16-Sep-1960 MRN: [id number]      Request for Surgical Clearance    Procedure:  L Shoulder Arthroscopy Rotator Cuff Repair   Date of Surgery:  Clearance 03/20/21                                 Surgeon:  Dr. Tamera Punt Surgeon's Group or Practice Name:  [facility]  Phone number:  [phone] Fax number:  [phone]   Type of Clearance Requested:   - Medical  - Pharmacy:  Hold Clopidogrel (Plavix) TBD by Cardiology   Type of Anesthesia:  Choice   Additional requests/questions:    Rosalyn Gess   03/14/2021, 11:15 AM

## 2021-03-14 NOTE — Telephone Encounter (Addendum)
° °  Patient Name: Tonya Chandler  DOB: 25-Mar-1960 MRN: [id number]  Primary Cardiologist: Jenne Campus, MD  Chart reviewed as part of pre-operative protocol coverage. Per chart, patient has history of CAD s/p CABG 2021 as well as DM, HTN, HLD. She was seen 02/07/21 for episodes of chest pain. Nuclear stress test 02/20/21 was normal. RCRI 6.6% indicating moderate CV risk. I reached out to Dr. Agustin Cree via secure chat for input on holding Plavix prior to surgery as requested given history above. He has granted clearance to hold this for 5 days prior to surgery. I reached out to patient for update on how she is doing. The patient affirms she has been doing well without any new cardiac symptoms. She has not had any further chest pain. Therefore, based on ACC/AHA guidelines, the patient would be at acceptable risk for the planned procedure without further cardiovascular testing. The patient was advised that if she develops new symptoms prior to surgery to contact our office to arrange for a follow-up visit. We discussed Dr. Marthann Schiller recommendation for holding Plavix above, and she verbalized understanding.  Will route this bundled recommendation to requesting provider via Epic fax function. Please call with questions.   Charlie Pitter, PA-C 03/14/2021, 11:40 AM

## 2021-03-14 NOTE — Telephone Encounter (Signed)
Epic crashed for the system and now I remain logged into the encounter from earlier, so cannot sign the chart. Will try again later (but this was faxed and has been finalized so no further preop action needed).

## 2021-03-20 DIAGNOSIS — M7542 Impingement syndrome of left shoulder: Secondary | ICD-10-CM | POA: Diagnosis not present

## 2021-03-20 DIAGNOSIS — M75122 Complete rotator cuff tear or rupture of left shoulder, not specified as traumatic: Secondary | ICD-10-CM | POA: Diagnosis not present

## 2021-03-20 DIAGNOSIS — M75102 Unspecified rotator cuff tear or rupture of left shoulder, not specified as traumatic: Secondary | ICD-10-CM | POA: Diagnosis not present

## 2021-03-20 DIAGNOSIS — G8918 Other acute postprocedural pain: Secondary | ICD-10-CM | POA: Diagnosis not present

## 2021-03-20 DIAGNOSIS — M24112 Other articular cartilage disorders, left shoulder: Secondary | ICD-10-CM | POA: Diagnosis not present

## 2021-03-26 ENCOUNTER — Ambulatory Visit: Payer: BC Managed Care – PPO | Admitting: Cardiology

## 2021-04-09 ENCOUNTER — Encounter: Payer: Self-pay | Admitting: Cardiology

## 2021-04-09 ENCOUNTER — Other Ambulatory Visit: Payer: Self-pay

## 2021-04-09 ENCOUNTER — Ambulatory Visit (INDEPENDENT_AMBULATORY_CARE_PROVIDER_SITE_OTHER): Payer: BC Managed Care – PPO | Admitting: Cardiology

## 2021-04-09 VITALS — BP 104/70 | HR 70 | Ht 60.0 in | Wt 163.0 lb

## 2021-04-09 DIAGNOSIS — I10 Essential (primary) hypertension: Secondary | ICD-10-CM | POA: Diagnosis not present

## 2021-04-09 DIAGNOSIS — E1140 Type 2 diabetes mellitus with diabetic neuropathy, unspecified: Secondary | ICD-10-CM | POA: Diagnosis not present

## 2021-04-09 DIAGNOSIS — E782 Mixed hyperlipidemia: Secondary | ICD-10-CM

## 2021-04-09 DIAGNOSIS — Z951 Presence of aortocoronary bypass graft: Secondary | ICD-10-CM

## 2021-04-09 DIAGNOSIS — Z794 Long term (current) use of insulin: Secondary | ICD-10-CM
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
## 2021-04-09 NOTE — Patient Instructions (Signed)

## 2021-04-10 ENCOUNTER — Ambulatory Visit: Payer: BC Managed Care – PPO | Attending: Orthopedic Surgery | Admitting: Physical Therapy

## 2021-04-10 ENCOUNTER — Encounter: Payer: Self-pay | Admitting: Physical Therapy

## 2021-04-10 DIAGNOSIS — M25561 Pain in right knee: Secondary | ICD-10-CM | POA: Insufficient documentation

## 2021-04-10 DIAGNOSIS — M25612 Stiffness of left shoulder, not elsewhere classified: Secondary | ICD-10-CM | POA: Insufficient documentation

## 2021-04-10 DIAGNOSIS — R293 Abnormal posture: Secondary | ICD-10-CM | POA: Diagnosis not present

## 2021-04-10 DIAGNOSIS — R2689 Other abnormalities of gait and mobility: Secondary | ICD-10-CM | POA: Insufficient documentation

## 2021-04-10 DIAGNOSIS — G8929 Other chronic pain: Secondary | ICD-10-CM | POA: Diagnosis not present

## 2021-04-10 DIAGNOSIS — R600 Localized edema: Secondary | ICD-10-CM | POA: Insufficient documentation

## 2021-04-10 DIAGNOSIS — M25512 Pain in left shoulder: Secondary | ICD-10-CM | POA: Diagnosis not present

## 2021-04-10 DIAGNOSIS — M6281 Muscle weakness (generalized): Secondary | ICD-10-CM | POA: Insufficient documentation
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
## 2021-04-12 ENCOUNTER — Other Ambulatory Visit: Payer: Self-pay

## 2021-04-12 ENCOUNTER — Encounter: Payer: Self-pay | Admitting: Rehabilitative and Restorative Service Providers"

## 2021-04-12 ENCOUNTER — Ambulatory Visit: Payer: BC Managed Care – PPO | Admitting: Rehabilitative and Restorative Service Providers"

## 2021-04-12 DIAGNOSIS — M25612 Stiffness of left shoulder, not elsewhere classified: Secondary | ICD-10-CM | POA: Diagnosis not present

## 2021-04-12 DIAGNOSIS — R293 Abnormal posture: Secondary | ICD-10-CM | POA: Diagnosis not present

## 2021-04-12 DIAGNOSIS — R600 Localized edema: Secondary | ICD-10-CM

## 2021-04-12 DIAGNOSIS — M6281 Muscle weakness (generalized): Secondary | ICD-10-CM

## 2021-04-12 DIAGNOSIS — M25512 Pain in left shoulder: Secondary | ICD-10-CM | POA: Diagnosis not present

## 2021-04-12 DIAGNOSIS — R2689 Other abnormalities of gait and mobility: Secondary | ICD-10-CM | POA: Diagnosis not present

## 2021-04-12 DIAGNOSIS — G8929 Other chronic pain: Secondary | ICD-10-CM

## 2021-04-12 DIAGNOSIS — M25561 Pain in right knee: Secondary | ICD-10-CM | POA: Diagnosis not present

## 2021-04-12 NOTE — Patient Instructions (Addendum)
? ?  Access Code: [email or web address] ?URL: [email or web address] ?Date: 04/12/2021 ?Prepared by: Gillermo Murdoch ? ?Exercises ?- Flexion-Extension Shoulder Pendulum with Table Support  - 1 x daily - 7 x weekly - 1 sets - 30 sec hold ?- Horizontal Shoulder Pendulum with Table Support  - 1 x daily - 7 x weekly - 1 sets - 30 sec hold ?- Circular Shoulder Pendulum with Table Support  - 1 x daily - 7 x weekly - 1 sets - 30 sec hold ?- Forearm Strengthening with Ball Squeeze  - 1 x daily - 7 x weekly - 3 sets - 10 reps ?- Seated Elbow Flexion and Extension AROM  - 2 x daily - 7 x weekly - 1 sets - 10 reps ?- Wrist Flexion Extension AROM with Fingers Curled and Palm Down  - 2 x daily - 7 x weekly - 1 sets - 10 reps ?- Wrist Radial Ulnar Deviation AROM  - 2 x daily - 7 x weekly - 1 sets - 10 reps ?- Standing Forearm Pronation and Supination AROM  - 2 x daily - 7 x weekly - 1 sets - 10 reps ?- Seated Scapular Retraction  - 2 x daily - 7 x weekly - 1-2 sets - 10 reps - 10 sec  hold ?- Seated Hip Flexor Stretch  - 2 x daily - 7 x weekly - 1 sets - 3 reps - 30 sec  hold ?- Seated Shoulder Flexion Towel Slide at Table Top Full Range of Motion  - 2 x daily - 7 x weekly - 1 sets - 5-10 reps - 5-10sec  hold ?- Supine Shoulder External Rotation with Dowel  - 2 x daily - 7 x weekly - 1 sets - 5 reps - 5 sec  hold ? ? ? ?Patient Education ?- Office Posture ? ?

## 2021-04-12 NOTE — Therapy (Signed)
[facility] [place] [address] [place], [place], [address] Phone: [phone]   Fax:  [phone]  Physical Therapy Treatment  Patient Details  Name: Tonya Chandler MRN: [id number] Date of Birth: 17-Nov-1960 Referring Provider (PT): Dr. Ave Filter   Encounter Date: 04/12/2021   PT End of Session - 04/12/21 0850     Visit Number 2    Number of Visits 24    Date for PT Re-Evaluation 07/03/21    Authorization Type BCBS    PT Start Time [phone]    PT Stop Time 0930    PT Time Calculation (min) 41 min    Activity Tolerance Patient tolerated treatment well             Past Medical History:  Diagnosis Date   Abnormal ECG 04/01/2018   Abnormal stress echocardiogram 05/24/2019   Formatting of this note might be different from the original. Added automatically from request for surgery [id number]   Abnormal stress test 05/24/2019   Formatting of this note might be different from the original. Added automatically from request for surgery [id number]   Anemia    Angina pectoris (HCC) 06/04/2019   Anxiety    Atherosclerosis of native coronary artery with angina pectoris (HCC) 05/10/2015   Formatting of this note might be different from the original. 30% lesion-Chiu   Bilateral carpal tunnel syndrome 05/23/2017   Bilateral knee pain 02/16/2010   Qualifier: Diagnosis of  By: Orson Aloe MD, Jeffrey     Carpal tunnel syndrome, right 12/16/2017   Colon polyps    Depression    DEPRESSION 02/16/2010   Qualifier: Diagnosis of  By: Orson Aloe MD, Tinnie Gens     Depression, major, in partial remission (HCC) 07/14/2015   Diabetes mellitus    Diverticulosis    Dupuytren's contracture of right hand 06/24/2017   Essential hypertension 02/16/2010   Qualifier: Diagnosis of  By: Orson Aloe MD, Tinnie Gens     Gastro-esophageal reflux    Gastrointestinal ulcer    Generalized anxiety disorder 03/14/2015   GERD 02/16/2010   Qualifier: Diagnosis of  By: Orson Aloe MD, Tinnie Gens     H/O:  GI bleed    Said it was due to taking metformin   Hyperlipidemia    Hyperlipidemia LDL goal <70 02/16/2010   Qualifier: Diagnosis of  By: Orson Aloe MD, Tinnie Gens     Hypertension    IBS (irritable bowel syndrome)    Impingement syndrome of shoulder, right 06/24/2017   Midline low back pain with left-sided sciatica 12/29/2013   Musculoskeletal chest pain 05/04/2015   Obesity    Post-nasal drainage 02/10/2020   Last Assessment & Plan:  Formatting of this note might be different from the original. Concern over postnasal drainage. Throat awareness symptoms ever since she was packed in the right nostril last summer.  Was treated with antibiotics with some improvement.  Causes intermittent hoarseness and loss of voice.  Non-smoker. EXAM by anterior rhinoscopy shows no obvious polyps or purulence or obstructi   Precordial pain 05/04/2015   Primary insomnia 03/14/2015   Radiculitis of right cervical region 01/20/2018   Radiculopathy 04/02/2018   Right hand pain 06/24/2017   RLS (restless legs syndrome) 07/14/2015   Type 2 diabetes mellitus with diabetic neuropathy, with long-term current use of insulin (HCC) 02/16/2010   Qualifier: Diagnosis of  By: Orson Aloe MD, Jeffrey     Unstable angina [facility]) 06/11/2019    Past Surgical History:  Procedure Laterality Date   ABDOMINAL HYSTERECTOMY     ANTERIOR CERVICAL  DECOMP/DISCECTOMY FUSION N/A 04/02/2018   Procedure: ANTERIOR CERVICAL DECOMPRESSION FUSION, CERVICAL FOUR-FIVE, CERVICAL FIVE-SIX, WITH INSTRUMENTATION AND ALLOGRAFT;  Surgeon: Estill Bamberg, MD;  Location: [facility] OR;  Service: Orthopedics;  Laterality: N/A;   COLONOSCOPY WITH ESOPHAGOGASTRODUODENOSCOPY (EGD)  05/04/2009   CORONARY ARTERY BYPASS GRAFT N/A 06/14/2019   Procedure: CORONARY ARTERY BYPASS GRAFTING (CABG) using LIMA to LAD; Endoscopic Right Greater Saphenous Vein Harvest: SVG to Circ (distal); SVG to RCA (distal).;  Surgeon: Delight Ovens, MD;  Location: [facility] OR;  Service: Open Heart Surgery;   Laterality: N/A;   ENDOVEIN HARVEST OF GREATER SAPHENOUS VEIN Right 06/14/2019   Procedure: [place] Guise Of Greater Saphenous Vein;  Surgeon: Delight Ovens, MD;  Location: [facility] OR;  Service: Open Heart Surgery;  Laterality: Right;   LEFT HEART CATH AND CORONARY ANGIOGRAPHY N/A 06/11/2019   Procedure: LEFT HEART CATH AND CORONARY ANGIOGRAPHY;  Surgeon: [name] Ouch, MD;  Location: [facility] INVASIVE CV LAB;  Service: Cardiovascular;  Laterality: N/A;   PIP JOINT FUSION Bilateral    pinky   TEE WITHOUT CARDIOVERSION N/A 06/14/2019   Procedure: TRANSESOPHAGEAL ECHOCARDIOGRAM (TEE);  Surgeon: Delight Ovens, MD;  Location: [facility] OR;  Service: Open Heart Surgery;  Laterality: N/A;    There were no vitals filed for this visit.   Subjective Assessment - 04/12/21 0851     Subjective Patient reports continued irritation in the Lt shoulder and arm. She is not sleeping well.    Currently in Pain? Yes    Pain Score 6     Pain Location Shoulder    Pain Orientation Left    Pain Descriptors / Indicators Tightness;Sore;Sharp    Pain Type Surgical pain;Chronic pain                               [facility] Adult PT Treatment/Exercise - 04/12/21 0001       Self-Care   Self-Care Other Self-Care Comments    Other Self-Care Comments  instruction in desk ergonomics for home      Shoulder Exercises: Standing   Other Standing Exercises scap squeeze w/ noodle along spine 5 sec x 10 reps      Shoulder Exercises: Stretch   External Rotation Stretch 5 reps   supine UE supported on pillow in scapular plane - 5 sec hold limited to < 20 deg   Table Stretch - Flexion 5 reps    Table Stretch -Flexion Limitations 5 sec hold limited to < 90 deg      Vasopneumatic   Number Minutes Vasopneumatic  10 minutes    Vasopnuematic Location  Shoulder    Vasopneumatic Pressure Low    Vasopneumatic Temperature  34      Manual Therapy   Soft tissue mobilization working through the upper trap and  clavicular area; pecs; posterior shoulder girdle musculature    Scapular Mobilization Lt    Passive ROM PROM Lt shoulder flexion to 90 deg; ER to ~ 15 deg per protocol; elbow flexion/extension; forearm sup/pronation                     PT Education - 04/12/21 0902     Education Details HEP    Person(s) Educated Patient    Methods Explanation;Demonstration;Tactile cues;Verbal cues;Handout    Comprehension Verbalized understanding;Returned demonstration;Verbal cues required;Tactile cues required              PT Short Term Goals - 04/10/21 1301  PT SHORT TERM GOAL #1   Title Pt will be independent with HEP    Time 6    Period Weeks    Status New    Target Date 05/22/21      PT SHORT TERM GOAL #2   Title Pt will have full AROM in L shoulder for light ADLs such as feeding and doing her hair    Time 6    Period Weeks    Status New    Target Date 05/22/21      PT SHORT TERM GOAL #3   Title Pt will be independent with maintaining postural stability for good scapulohumeral rhythm    Time 6    Period Weeks    Status New    Target Date 05/22/21               PT Long Term Goals - 04/10/21 1304       PT LONG TERM GOAL #1   Title The patient will be indep with HEP progression.    Time 12    Period Weeks    Status New    Target Date 07/03/21      PT LONG TERM GOAL #2   Title Pt will have full pain free shoulder ROM to perform all of her normal tasks    Time 12    Period Weeks    Status New    Target Date 07/03/21      PT LONG TERM GOAL #3   Title Pt will be able to lift and carry at least 5# overhead to demo improved UE functional strength    Time 12    Period Weeks    Status New    Target Date 07/03/21      PT LONG TERM GOAL #4   Title Pt will have improved FOTO score to predicted level    Time 12    Period Weeks    Status New    Target Date 07/03/21                   Plan - 04/12/21 0855     Clinical Impression Statement  Comtinued pain and difficulty sleeping. Reviewed exercises and added scapular isometrics(scap squeeze). Continue with manual work and PROM per protocol.    Rehab Potential Good    PT Frequency 2x / week    PT Duration 12 weeks    PT Treatment/Interventions ADLs/Self Care Home Management;Aquatic Therapy;Cryotherapy;Electrical Stimulation;Iontophoresis 4mg /ml Dexamethasone;DME Instruction;Gait training;Stair training;Functional mobility training;Orthotic Fit/Training;Therapeutic activities;Therapeutic exercise;Balance training;Neuromuscular re-education;Patient/family education;Manual techniques;Passive range of motion;Dry needling;Taping;Vasopneumatic Device;Scar mobilization;Moist Heat    PT Next Visit Plan Shoulder PROM and manual work as needed. Review HEP as needed. FOTO    PT Home Exercise Plan [id number]    Consulted and Agree with Plan of Care Patient             Patient will benefit from skilled therapeutic intervention in order to improve the following deficits and impairments:     Visit Diagnosis: Stiffness of left shoulder, not elsewhere classified  Chronic left shoulder pain  Muscle weakness (generalized)  Localized edema  Abnormal posture     Problem List Patient Active Problem List   Diagnosis Date Noted   Acute meniscal tear of right knee 11/07/2020   Grief 11/07/2020   HSV-2 (herpes simplex virus 2) infection - genital, (+)viral culture 08/2020 09/11/2020   Obesity    IBS (irritable bowel syndrome)    Hypertension    Hyperlipidemia  H/O: GI bleed    Gastrointestinal ulcer    Gastro-esophageal reflux    Diverticulosis    Depression    Anxiety    Post-nasal drainage 02/10/2020   S/P CABG x 3 LIMA to LAD, SVG sequential to RCA and circumflex in May 2021    Angina pectoris (HCC) 06/04/2019   Impingement syndrome of shoulder, right 06/24/2017   Depression, major, in partial remission (HCC) 07/14/2015   RLS (restless legs syndrome) 07/14/2015    Generalized anxiety disorder 03/14/2015   Primary insomnia 03/14/2015   Type 2 diabetes mellitus with diabetic neuropathy, with long-term current use of insulin (HCC) 02/16/2010   Essential hypertension 02/16/2010   GERD 02/16/2010   Bilateral knee pain 02/16/2010    Artemus Romanoff Rober Minion, PT, MPH  04/12/2021, 9:34 AM  [facility] [address] [place], [place], [address] Phone: [phone]   Fax:  [phone]  Name: Tonya Chandler MRN: [id number] Date of Birth: 17-Oct-1960

## 2021-04-16 ENCOUNTER — Other Ambulatory Visit: Payer: Self-pay

## 2021-04-16 ENCOUNTER — Encounter: Payer: Self-pay | Admitting: Rehabilitative and Restorative Service Providers"

## 2021-04-16 ENCOUNTER — Ambulatory Visit: Payer: BC Managed Care – PPO | Admitting: Rehabilitative and Restorative Service Providers"

## 2021-04-16 DIAGNOSIS — M25612 Stiffness of left shoulder, not elsewhere classified: Secondary | ICD-10-CM

## 2021-04-16 DIAGNOSIS — G8929 Other chronic pain: Secondary | ICD-10-CM | POA: Diagnosis not present

## 2021-04-16 DIAGNOSIS — M25512 Pain in left shoulder: Secondary | ICD-10-CM | POA: Diagnosis not present

## 2021-04-16 DIAGNOSIS — R600 Localized edema: Secondary | ICD-10-CM

## 2021-04-16 DIAGNOSIS — R2689 Other abnormalities of gait and mobility: Secondary | ICD-10-CM | POA: Diagnosis not present

## 2021-04-16 DIAGNOSIS — R293 Abnormal posture: Secondary | ICD-10-CM | POA: Diagnosis not present

## 2021-04-16 DIAGNOSIS — M6281 Muscle weakness (generalized): Secondary | ICD-10-CM | POA: Diagnosis not present

## 2021-04-16 DIAGNOSIS — M25561 Pain in right knee: Secondary | ICD-10-CM | POA: Diagnosis not present

## 2021-04-16 NOTE — Therapy (Signed)
[place] [facility] ?[place] ?[place], [name], [address] ?Phone: [phone]   Fax:  [phone] ? ?Physical Therapy Treatment ? ?Patient Details  ?Name: Tonya Chandler ?MRN: [id number] ?Date of Birth: April 22, 1960 ?Referring Provider (PT): Dr. Tamera Punt ? ? ?Encounter Date: 04/16/2021 ? ? PT End of Session - 04/16/21 1029   ? ? Visit Number 3   ? Number of Visits 24   ? Date for PT Re-Evaluation 07/03/21   ? Authorization Type BCBS   ? PT Start Time 1015   ? PT Stop Time 1100   ? PT Time Calculation (min) 45 min   ? Activity Tolerance Patient tolerated treatment well   ? ?  ?  ? ?  ? ? ?Past Medical History:  ?Diagnosis Date  ? Abnormal ECG 04/01/2018  ? Abnormal stress echocardiogram 05/24/2019  ? Formatting of this note might be different from the original. Added automatically from request for surgery [id number]  ? Abnormal stress test 05/24/2019  ? Formatting of this note might be different from the original. Added automatically from request for surgery [id number]  ? Anemia   ? Angina pectoris ([place]) 06/04/2019  ? Anxiety   ? Atherosclerosis of native coronary artery with angina pectoris ([place]) 05/10/2015  ? Formatting of this note might be different from the original. 30% lesion-Chiu  ? Bilateral carpal tunnel syndrome 05/23/2017  ? Bilateral knee pain 02/16/2010  ? Qualifier: Diagnosis of  By: Koleen Nimrod MD, Dellis Filbert    ? Carpal tunnel syndrome, right 12/16/2017  ? Colon polyps   ? Depression   ? DEPRESSION 02/16/2010  ? Qualifier: Diagnosis of  By: Koleen Nimrod MD, Dellis Filbert    ? Depression, major, in partial remission ([place]) 07/14/2015  ? Diabetes mellitus   ? Diverticulosis   ? Dupuytren's contracture of right hand 06/24/2017  ? Essential hypertension 02/16/2010  ? Qualifier: Diagnosis of  By: Koleen Nimrod MD, Dellis Filbert    ? Gastro-esophageal reflux   ? Gastrointestinal ulcer   ? Generalized anxiety disorder 03/14/2015  ? GERD 02/16/2010  ? Qualifier: Diagnosis of  By: Koleen Nimrod MD, Dellis Filbert    ? H/O:  GI bleed   ? Michela Pitcher it was due to taking metformin  ? Hyperlipidemia   ? Hyperlipidemia LDL goal <70 02/16/2010  ? Qualifier: Diagnosis of  By: Koleen Nimrod MD, Dellis Filbert    ? Hypertension   ? IBS (irritable bowel syndrome)   ? Impingement syndrome of shoulder, right 06/24/2017  ? Midline low back pain with left-sided sciatica 12/29/2013  ? Musculoskeletal chest pain 05/04/2015  ? Obesity   ? Post-nasal drainage 02/10/2020  ? Last Assessment & Plan:  Formatting of this note might be different from the original. Concern over postnasal drainage. Throat awareness symptoms ever since she was packed in the right nostril last summer.  Was treated with antibiotics with some improvement.  Causes intermittent hoarseness and loss of voice.  Non-smoker. EXAM by anterior rhinoscopy shows no obvious polyps or purulence or obstructi  ? Precordial pain 05/04/2015  ? Primary insomnia 03/14/2015  ? Radiculitis of right cervical region 01/20/2018  ? Radiculopathy 04/02/2018  ? Right hand pain 06/24/2017  ? RLS (restless legs syndrome) 07/14/2015  ? Type 2 diabetes mellitus with diabetic neuropathy, with long-term current use of insulin ([place]) 02/16/2010  ? Qualifier: Diagnosis of  By: Koleen Nimrod MD, Dellis Filbert    ? Unstable angina ([place]) 06/11/2019  ? ? ?Past Surgical History:  ?Procedure Laterality Date  ? ABDOMINAL HYSTERECTOMY    ? ANTERIOR CERVICAL  DECOMP/DISCECTOMY FUSION N/A 04/02/2018  ? Procedure: ANTERIOR CERVICAL DECOMPRESSION FUSION, CERVICAL FOUR-FIVE, CERVICAL FIVE-SIX, WITH INSTRUMENTATION AND ALLOGRAFT;  Surgeon: Phylliss Bob, MD;  Location: [place];  Service: Orthopedics;  Laterality: N/A;  ? COLONOSCOPY WITH ESOPHAGOGASTRODUODENOSCOPY (EGD)  05/04/2009  ? CORONARY ARTERY BYPASS GRAFT N/A 06/14/2019  ? Procedure: CORONARY ARTERY BYPASS GRAFTING (CABG) using LIMA to LAD; Endoscopic Right Greater Saphenous Vein Harvest: SVG to Circ (distal); SVG to RCA (distal).;  Surgeon: [name], MD;  Location: [place];  Service: Open Heart Surgery;   Laterality: N/A;  ? ENDOVEIN HARVEST OF GREATER SAPHENOUS VEIN Right 06/14/2019  ? Procedure: [place] Ropes Of Greater Saphenous Vein;  Surgeon: [name], MD;  Location: [place];  Service: Open Heart Surgery;  Laterality: Right;  ? LEFT HEART CATH AND CORONARY ANGIOGRAPHY N/A 06/11/2019  ? Procedure: LEFT HEART CATH AND CORONARY ANGIOGRAPHY;  Surgeon: Wellington Hampshire, MD;  Location: [place] CV LAB;  Service: Cardiovascular;  Laterality: N/A;  ? PIP JOINT FUSION Bilateral   ? pinky  ? TEE WITHOUT CARDIOVERSION N/A 06/14/2019  ? Procedure: TRANSESOPHAGEAL ECHOCARDIOGRAM (TEE);  Surgeon: [name], MD;  Location: [place];  Service: Open Heart Surgery;  Laterality: N/A;  ? ? ?There were no vitals filed for this visit. ? ? Subjective Assessment - 04/16/21 1029   ? ? Subjective Not resting well. Just can't get comfortable.   ? Currently in Pain? Yes   ? Pain Score 3    ? Pain Location Shoulder   ? Pain Orientation Left   ? Pain Descriptors / Indicators Tightness;Sore   ? Pain Type Surgical pain;Chronic pain   ? ?  ?  ? ?  ? ? ? ? ? [facility] PT Assessment - 04/16/21 0001   ? ?  ? Assessment  ? Medical Diagnosis Z98.890 (ICD-10-CM) - S/P left rotator cuff repair   ? Referring Provider (PT) Dr. Tamera Punt   ? Hand Dominance Left   ?  ? Precautions  ? Precautions Shoulder   ? Precaution Comments Week 0-6: Arm in abd sling/brace, remove only for exercises. No shoulder AROM, lifting of objects, shoulder motion behind back, excessive stretching or sudden movements, supporting of any weight, lifting of body weight by hands   ?  ? Restrictions  ? Weight Bearing Restrictions Yes   ? LUE Weight Bearing Non weight bearing   ?  ? PROM  ? Overall PROM Comments PROM within tissue tolerance - no pain - pt supine   ? Left Shoulder Flexion 112 Degrees   ? Left Shoulder ABduction 121 Degrees   in scapular plane  ? Left Shoulder External Rotation 34 Degrees   in scapular plane  ? ?  ?  ? ?  ? ? ? ? ? ? ? ? ? ? ? ? ? ? ? ? [place]  Adult PT Treatment/Exercise - 04/16/21 0001   ? ?  ? Shoulder Exercises: Standing  ? Other Standing Exercises scap squeeze w/ noodle along spine 5 sec x 10 reps   ?  ? Shoulder Exercises: ROM/Strengthening  ? Pendulum flex/ext' circles CW/CCW x 30 each   ?  ? Shoulder Exercises: Stretch  ? External Rotation Stretch 5 reps   supine UE supported on pillow in scapular plane - 5 sec hold limited to < 20 deg; standing using dowel - towel under arm 10 sec x 5 reps  ? Table Stretch - Flexion 5 reps   ? Table Stretch -Flexion Limitations 10 sec  hold limited to < 90 deg   ? Other Shoulder Stretches shoulder flexion assisting with Rt UE supine 10 sec hold x 5 reps   ?  ? Vasopneumatic  ? Number Minutes Vasopneumatic  10 minutes   ? Vasopnuematic Location  Shoulder   ? Vasopneumatic Pressure Low   ? Vasopneumatic Temperature  34?   ?  ? Manual Therapy  ? Soft tissue mobilization working through the upper trap and clavicular area; pecs; posterior shoulder girdle musculature   ? Scapular Mobilization Lt   ? Passive ROM PROM Lt shoulder flexion to 90 deg; ER to ~ 15 deg per protocol; elbow flexion/extension; forearm sup/pronation   ? ?  ?  ? ?  ? ? ? ? ? ? ? ? ? ? ? ? PT Short Term Goals - 04/10/21 1301   ? ?  ? PT SHORT TERM GOAL #1  ? Title Pt will be independent with HEP   ? Time 6   ? Period Weeks   ? Status New   ? Target Date 05/22/21   ?  ? PT SHORT TERM GOAL #2  ? Title Pt will have full AROM in L shoulder for light ADLs such as feeding and doing her hair   ? Time 6   ? Period Weeks   ? Status New   ? Target Date 05/22/21   ?  ? PT SHORT TERM GOAL #3  ? Title Pt will be independent with maintaining postural stability for good scapulohumeral rhythm   ? Time 6   ? Period Weeks   ? Status New   ? Target Date 05/22/21   ? ?  ?  ? ?  ? ? ? ? PT Long Term Goals - 04/10/21 1304   ? ?  ? PT LONG TERM GOAL #1  ? Title The patient will be indep with HEP progression.   ? Time 12   ? Period Weeks   ? Status New   ? Target Date  07/03/21   ?  ? PT LONG TERM GOAL #2  ? Title Pt will have full pain free shoulder ROM to perform all of her normal tasks   ? Time 12   ? Period Weeks   ? Status New   ? Target Date 07/03/21   ?  ? PT LONG TE

## 2021-04-19 ENCOUNTER — Ambulatory Visit: Payer: BC Managed Care – PPO | Admitting: Physical Therapy

## 2021-04-19 DIAGNOSIS — R600 Localized edema: Secondary | ICD-10-CM

## 2021-04-19 DIAGNOSIS — M25612 Stiffness of left shoulder, not elsewhere classified: Secondary | ICD-10-CM | POA: Diagnosis not present

## 2021-04-19 DIAGNOSIS — M25512 Pain in left shoulder: Secondary | ICD-10-CM | POA: Diagnosis not present

## 2021-04-19 DIAGNOSIS — R2689 Other abnormalities of gait and mobility: Secondary | ICD-10-CM

## 2021-04-19 DIAGNOSIS — G8929 Other chronic pain: Secondary | ICD-10-CM

## 2021-04-19 DIAGNOSIS — R293 Abnormal posture: Secondary | ICD-10-CM

## 2021-04-19 DIAGNOSIS — M25561 Pain in right knee: Secondary | ICD-10-CM | POA: Diagnosis not present

## 2021-04-19 DIAGNOSIS — M6281 Muscle weakness (generalized): Secondary | ICD-10-CM

## 2021-04-19 NOTE — Therapy (Signed)
[place] ?[facility] ?[place] ?[place], [name], [address] ?Phone: [phone]   Fax:  [phone] ? ?Physical Therapy Treatment ? ?Patient Details  ?Name: Tonya Chandler ?MRN: [id number] ?Date of Birth: 1960-08-16 ?Referring Provider (PT): Dr. Tamera Punt ? ? ?Encounter Date: 04/19/2021 ? ? PT End of Session - 04/19/21 1401   ? ? Visit Number 4   ? Number of Visits 24   ? Date for PT Re-Evaluation 07/03/21   ? Authorization Type BCBS   ? PT Start Time 1401   ? PT Stop Time 6720   ? PT Time Calculation (min) 44 min   ? Activity Tolerance Patient tolerated treatment well   ? Behavior During Therapy [facility] for tasks assessed/performed   ? ?  ?  ? ?  ? ? ?Past Medical History:  ?Diagnosis Date  ? Abnormal ECG 04/01/2018  ? Abnormal stress echocardiogram 05/24/2019  ? Formatting of this note might be different from the original. Added automatically from request for surgery [phone]  ? Abnormal stress test 05/24/2019  ? Formatting of this note might be different from the original. Added automatically from request for surgery [phone]  ? Anemia   ? Angina pectoris ([place]) 06/04/2019  ? Anxiety   ? Atherosclerosis of native coronary artery with angina pectoris ([place]) 05/10/2015  ? Formatting of this note might be different from the original. 30% lesion-Chiu  ? Bilateral carpal tunnel syndrome 05/23/2017  ? Bilateral knee pain 02/16/2010  ? Qualifier: Diagnosis of  By: Koleen Nimrod MD, Dellis Filbert    ? Carpal tunnel syndrome, right 12/16/2017  ? Colon polyps   ? Depression   ? DEPRESSION 02/16/2010  ? Qualifier: Diagnosis of  By: Koleen Nimrod MD, Dellis Filbert    ? Depression, major, in partial remission ([place]) 07/14/2015  ? Diabetes mellitus   ? Diverticulosis   ? Dupuytren's contracture of right hand 06/24/2017  ? Essential hypertension 02/16/2010  ? Qualifier: Diagnosis of  By: Koleen Nimrod MD, Dellis Filbert    ? Gastro-esophageal reflux   ? Gastrointestinal ulcer   ? Generalized anxiety disorder 03/14/2015  ? GERD 02/16/2010  ?  Qualifier: Diagnosis of  By: Koleen Nimrod MD, Dellis Filbert    ? H/O: GI bleed   ? Michela Pitcher it was due to taking metformin  ? Hyperlipidemia   ? Hyperlipidemia LDL goal <70 02/16/2010  ? Qualifier: Diagnosis of  By: Koleen Nimrod MD, Dellis Filbert    ? Hypertension   ? IBS (irritable bowel syndrome)   ? Impingement syndrome of shoulder, right 06/24/2017  ? Midline low back pain with left-sided sciatica 12/29/2013  ? Musculoskeletal chest pain 05/04/2015  ? Obesity   ? Post-nasal drainage 02/10/2020  ? Last Assessment & Plan:  Formatting of this note might be different from the original. Concern over postnasal drainage. Throat awareness symptoms ever since she was packed in the right nostril last summer.  Was treated with antibiotics with some improvement.  Causes intermittent hoarseness and loss of voice.  Non-smoker. EXAM by anterior rhinoscopy shows no obvious polyps or purulence or obstructi  ? Precordial pain 05/04/2015  ? Primary insomnia 03/14/2015  ? Radiculitis of right cervical region 01/20/2018  ? Radiculopathy 04/02/2018  ? Right hand pain 06/24/2017  ? RLS (restless legs syndrome) 07/14/2015  ? Type 2 diabetes mellitus with diabetic neuropathy, with long-term current use of insulin ([place]) 02/16/2010  ? Qualifier: Diagnosis of  By: Koleen Nimrod MD, Dellis Filbert    ? Unstable angina ([place]) 06/11/2019  ? ? ?Past Surgical History:  ?Procedure Laterality Date  ?  ABDOMINAL HYSTERECTOMY    ? ANTERIOR CERVICAL DECOMP/DISCECTOMY FUSION N/A 04/02/2018  ? Procedure: ANTERIOR CERVICAL DECOMPRESSION FUSION, CERVICAL FOUR-FIVE, CERVICAL FIVE-SIX, WITH INSTRUMENTATION AND ALLOGRAFT;  Surgeon: Phylliss Bob, MD;  Location: [place];  Service: Orthopedics;  Laterality: N/A;  ? COLONOSCOPY WITH ESOPHAGOGASTRODUODENOSCOPY (EGD)  05/04/2009  ? CORONARY ARTERY BYPASS GRAFT N/A 06/14/2019  ? Procedure: CORONARY ARTERY BYPASS GRAFTING (CABG) using LIMA to LAD; Endoscopic Right Greater Saphenous Vein Harvest: SVG to Circ (distal); SVG to RCA (distal).;  Surgeon: [name], MD;  Location: [place];  Service: Open Heart Surgery;  Laterality: N/A;  ? ENDOVEIN HARVEST OF GREATER SAPHENOUS VEIN Right 06/14/2019  ? Procedure: [place] Of Greater Saphenous Vein;  Surgeon: [name], MD;  Location: [place];  Service: Open Heart Surgery;  Laterality: Right;  ? LEFT HEART CATH AND CORONARY ANGIOGRAPHY N/A 06/11/2019  ? Procedure: LEFT HEART CATH AND CORONARY ANGIOGRAPHY;  Surgeon: Wellington Hampshire, MD;  Location: [place] [facility];  Service: Cardiovascular;  Laterality: N/A;  ? PIP JOINT FUSION Bilateral   ? pinky  ? TEE WITHOUT CARDIOVERSION N/A 06/14/2019  ? Procedure: TRANSESOPHAGEAL ECHOCARDIOGRAM (TEE);  Surgeon: [name], MD;  Location: [place];  Service: Open Heart Surgery;  Laterality: N/A;  ? ? ?There were no vitals filed for this visit. ? ? Subjective Assessment - 04/19/21 1402   ? ? Subjective Pt states she was very sore yesterday. Has been doing okay today. Notes she was dressing and accidentally reached behind her for her pants.   ? Pertinent History cervical fusion 2020, Lt rotator cuff tear   ? Patient Stated Goals reduce radicular symptoms   ? Currently in Pain? Yes   ? Pain Score 1    ? Pain Location Shoulder   ? Pain Orientation Left   ? ?  ?  ? ?  ? ? ? ? ? [facility] PT Assessment - 04/19/21 0001   ? ?  ? Assessment  ? Medical Diagnosis Z98.890 (ICD-10-CM) - S/P left rotator cuff repair   ? Referring Provider (PT) Dr. Tamera Punt   ? Hand Dominance Left   ?  ? Restrictions  ? Weight Bearing Restrictions Yes   ? LUE Weight Bearing Non weight bearing   ? ?  ?  ? ?  ? ? ? ? ? ? ? ? ? ? ? ? ? ? ? ? [place] Adult PT Treatment/Exercise - 04/19/21 0001   ? ?  ? Neck Exercises: Stretches  ? Upper Trapezius Stretch 30 seconds   ? Other Neck Stretches levator stretch x30 sec   ? Other Neck Stretches scalene stretch x 30 sec   ?  ? Shoulder Exercises: Standing  ? Other Standing Exercises scap squeeze w/ noodle along spine 5 sec x 10 reps   ? Other Standing Exercises  Scapular clock x10   ?  ? Shoulder Exercises: ROM/Strengthening  ? Pendulum flex/ext' circles CW/CCW x 30 each   ?  ? Vasopneumatic  ? Number Minutes Vasopneumatic  10 minutes   ? Vasopnuematic Location  Shoulder   ? Vasopneumatic Pressure Low   ? Vasopneumatic Temperature  34?   ?  ? Manual Therapy  ? Soft tissue mobilization working through the upper trap and clavicular area; pecs; posterior shoulder girdle musculature   ? Scapular Mobilization Lt   ? Passive ROM PROM Lt shoulder flexion to 90 deg; ER to ~ 15 deg per protocol; elbow flexion/extension; forearm sup/pronation   ? ?  ?  ? ?  ? ? ? ? ? ? ? ? ? ? ? ?  PT Short Term Goals - 04/10/21 1301   ? ?  ? PT SHORT TERM GOAL #1  ? Title Pt will be independent with HEP   ? Time 6   ? Period Weeks   ? Status New   ? Target Date 05/22/21   ?  ? PT SHORT TERM GOAL #2  ? Title Pt will have full AROM in L shoulder for light ADLs such as feeding and doing her hair   ? Time 6   ? Period Weeks   ? Status New   ? Target Date 05/22/21   ?  ? PT SHORT TERM GOAL #3  ? Title Pt will be independent with maintaining postural stability for good scapulohumeral rhythm   ? Time 6   ? Period Weeks   ? Status New   ? Target Date 05/22/21   ? ?  ?  ? ?  ? ? ? ? PT Long Term Goals - 04/10/21 1304   ? ?  ? PT LONG TERM GOAL #1  ? Title The patient will be indep with HEP progression.   ? Time 12   ? Period Weeks   ? Status New   ? Target Date 07/03/21   ?  ? PT LONG TERM GOAL #2  ? Title Pt will have full pain free shoulder ROM to perform all of her normal tasks   ? Time 12   ? Period Weeks   ? Status New   ? Target Date 07/03/21   ?  ? PT LONG TERM GOAL #3  ? Title Pt will be able to lift and carry at least 5# overhead to demo improved UE functional strength   ? Time 12   ? Period Weeks   ? Status New   ? Target Date 07/03/21   ?  ? PT LONG TERM GOAL #4  ? Title Pt will have improved FOTO score to predicted level   ? Time 12   ? Period Weeks   ? Status New   ? Target Date 07/03/21   ? ?  ?   ? ?  ? ? ? ? ? ? ? ? Plan - 04/19/21 1437   ? ? Clinical Impression Statement Continued to work on gentle PROM, manual work. Treatment focused on scapular mobility and stability.   ? Personal Factors and C

## 2021-04-20 ENCOUNTER — Telehealth: Payer: Self-pay | Admitting: Neurology

## 2021-04-20 MED ORDER — CYCLOBENZAPRINE HCL 10 MG PO TABS: ORAL_TABLET | ORAL | 3 refills | Status: AC

## 2021-04-20 NOTE — Telephone Encounter (Signed)
I assume we are talking about Flexeril, I will go ahead and refill that. ?

## 2021-04-20 NOTE — Telephone Encounter (Signed)
Received request for medication. Last written 11/2019 #30 with 3 refills, please advise.  ?

## 2021-04-23 ENCOUNTER — Encounter: Payer: Self-pay | Admitting: Rehabilitative and Restorative Service Providers"

## 2021-04-23 ENCOUNTER — Ambulatory Visit
Payer: BC Managed Care – PPO | Attending: Orthopedic Surgery | Admitting: Rehabilitative and Restorative Service Providers"

## 2021-04-23 DIAGNOSIS — R600 Localized edema: Secondary | ICD-10-CM | POA: Diagnosis not present

## 2021-04-23 DIAGNOSIS — R293 Abnormal posture: Secondary | ICD-10-CM | POA: Insufficient documentation

## 2021-04-23 DIAGNOSIS — M25512 Pain in left shoulder: Secondary | ICD-10-CM | POA: Insufficient documentation

## 2021-04-23 DIAGNOSIS — M6281 Muscle weakness (generalized): Secondary | ICD-10-CM | POA: Insufficient documentation

## 2021-04-23 DIAGNOSIS — M25612 Stiffness of left shoulder, not elsewhere classified: Secondary | ICD-10-CM | POA: Diagnosis not present

## 2021-04-23 DIAGNOSIS — G8929 Other chronic pain: Secondary | ICD-10-CM | POA: Insufficient documentation

## 2021-04-23 NOTE — Patient Instructions (Signed)
Access Code: [email or web address] ?URL: [email or web address] ?Date: 04/23/2021 ?Prepared by: Gillermo Murdoch ? ?Exercises ?- Flexion-Extension Shoulder Pendulum with Table Support  - 1 x daily - 7 x weekly - 1 sets - 30 sec hold ?- Horizontal Shoulder Pendulum with Table Support  - 1 x daily - 7 x weekly - 1 sets - 30 sec hold ?- Circular Shoulder Pendulum with Table Support  - 1 x daily - 7 x weekly - 1 sets - 30 sec hold ?- Forearm Strengthening with Ball Squeeze  - 1 x daily - 7 x weekly - 3 sets - 10 reps ?- Seated Elbow Flexion and Extension AROM  - 2 x daily - 7 x weekly - 1 sets - 10 reps ?- Wrist Flexion Extension AROM with Fingers Curled and Palm Down  - 2 x daily - 7 x weekly - 1 sets - 10 reps ?- Wrist Radial Ulnar Deviation AROM  - 2 x daily - 7 x weekly - 1 sets - 10 reps ?- Standing Forearm Pronation and Supination AROM  - 2 x daily - 7 x weekly - 1 sets - 10 reps ?- Seated Scapular Retraction  - 2 x daily - 7 x weekly - 1-2 sets - 10 reps - 10 sec  hold ?- Seated Hip Flexor Stretch  - 2 x daily - 7 x weekly - 1 sets - 3 reps - 30 sec  hold ?- Seated Shoulder Flexion Towel Slide at Table Top Full Range of Motion  - 2 x daily - 7 x weekly - 1 sets - 5-10 reps - 5-10sec  hold ?- Supine Shoulder External Rotation with Dowel  - 2 x daily - 7 x weekly - 1 sets - 5 reps - 5 sec  hold ?- Standing Shoulder and Trunk Flexion at Table  - 2 x daily - 7 x weekly - 1 sets - 5 reps - 5 sec  hold ?- Supine Shoulder Flexion AAROM with Dowel  - 2 x daily - 7 x weekly - 1 sets - 5-10 reps - 5-10 sec  hold ?

## 2021-04-23 NOTE — Therapy (Signed)
[place] ?[facility] ?[place] ?[place], [name], [address] ?Phone: [phone]   Fax:  [phone] ? ?Physical Therapy Treatment ? ?Patient Details  ?Name: Tonya Chandler ?MRN: [id number] ?Date of Birth: 06/04/1960 ?Referring Provider (PT): Dr. Tamera Punt ? ? ?Encounter Date: 04/23/2021 ? ? PT End of Session - 04/23/21 1022   ? ? Visit Number 5   ? Number of Visits 24   ? Date for PT Re-Evaluation 07/03/21   ? Authorization Type BCBS   ? PT Start Time 1016   ? PT Stop Time 1105   ? PT Time Calculation (min) 49 min   ? Activity Tolerance Patient tolerated treatment well   ? ?  ?  ? ?  ? ? ?Past Medical History:  ?Diagnosis Date  ? Abnormal ECG 04/01/2018  ? Abnormal stress echocardiogram 05/24/2019  ? Formatting of this note might be different from the original. Added automatically from request for surgery [phone]  ? Abnormal stress test 05/24/2019  ? Formatting of this note might be different from the original. Added automatically from request for surgery [phone]  ? Anemia   ? Angina pectoris ([place]) 06/04/2019  ? Anxiety   ? Atherosclerosis of native coronary artery with angina pectoris ([place]) 05/10/2015  ? Formatting of this note might be different from the original. 30% lesion-Chiu  ? Bilateral carpal tunnel syndrome 05/23/2017  ? Bilateral knee pain 02/16/2010  ? Qualifier: Diagnosis of  By: Koleen Nimrod MD, Dellis Filbert    ? Carpal tunnel syndrome, right 12/16/2017  ? Colon polyps   ? Depression   ? DEPRESSION 02/16/2010  ? Qualifier: Diagnosis of  By: Koleen Nimrod MD, Dellis Filbert    ? Depression, major, in partial remission ([place]) 07/14/2015  ? Diabetes mellitus   ? Diverticulosis   ? Dupuytren's contracture of right hand 06/24/2017  ? Essential hypertension 02/16/2010  ? Qualifier: Diagnosis of  By: Koleen Nimrod MD, Dellis Filbert    ? Gastro-esophageal reflux   ? Gastrointestinal ulcer   ? Generalized anxiety disorder 03/14/2015  ? GERD 02/16/2010  ? Qualifier: Diagnosis of  By: Koleen Nimrod MD, Dellis Filbert    ? H/O:  GI bleed   ? Michela Pitcher it was due to taking metformin  ? Hyperlipidemia   ? Hyperlipidemia LDL goal <70 02/16/2010  ? Qualifier: Diagnosis of  By: Koleen Nimrod MD, Dellis Filbert    ? Hypertension   ? IBS (irritable bowel syndrome)   ? Impingement syndrome of shoulder, right 06/24/2017  ? Midline low back pain with left-sided sciatica 12/29/2013  ? Musculoskeletal chest pain 05/04/2015  ? Obesity   ? Post-nasal drainage 02/10/2020  ? Last Assessment & Plan:  Formatting of this note might be different from the original. Concern over postnasal drainage. Throat awareness symptoms ever since she was packed in the right nostril last summer.  Was treated with antibiotics with some improvement.  Causes intermittent hoarseness and loss of voice.  Non-smoker. EXAM by anterior rhinoscopy shows no obvious polyps or purulence or obstructi  ? Precordial pain 05/04/2015  ? Primary insomnia 03/14/2015  ? Radiculitis of right cervical region 01/20/2018  ? Radiculopathy 04/02/2018  ? Right hand pain 06/24/2017  ? RLS (restless legs syndrome) 07/14/2015  ? Type 2 diabetes mellitus with diabetic neuropathy, with long-term current use of insulin ([place]) 02/16/2010  ? Qualifier: Diagnosis of  By: Koleen Nimrod MD, Dellis Filbert    ? Unstable angina ([place]) 06/11/2019  ? ? ?Past Surgical History:  ?Procedure Laterality Date  ? ABDOMINAL HYSTERECTOMY    ? ANTERIOR CERVICAL  DECOMP/DISCECTOMY FUSION N/A 04/02/2018  ? Procedure: ANTERIOR CERVICAL DECOMPRESSION FUSION, CERVICAL FOUR-FIVE, CERVICAL FIVE-SIX, WITH INSTRUMENTATION AND ALLOGRAFT;  Surgeon: Phylliss Bob, MD;  Location: [place];  Service: Orthopedics;  Laterality: N/A;  ? COLONOSCOPY WITH ESOPHAGOGASTRODUODENOSCOPY (EGD)  05/04/2009  ? CORONARY ARTERY BYPASS GRAFT N/A 06/14/2019  ? Procedure: CORONARY ARTERY BYPASS GRAFTING (CABG) using LIMA to LAD; Endoscopic Right Greater Saphenous Vein Harvest: SVG to Circ (distal); SVG to RCA (distal).;  Surgeon: [name], MD;  Location: [place];  Service: Open Heart Surgery;   Laterality: N/A;  ? ENDOVEIN HARVEST OF GREATER SAPHENOUS VEIN Right 06/14/2019  ? Procedure: [place] Of Greater Saphenous Vein;  Surgeon: [name], MD;  Location: [place];  Service: Open Heart Surgery;  Laterality: Right;  ? LEFT HEART CATH AND CORONARY ANGIOGRAPHY N/A 06/11/2019  ? Procedure: LEFT HEART CATH AND CORONARY ANGIOGRAPHY;  Surgeon: Wellington Hampshire, MD;  Location: [place] [facility];  Service: Cardiovascular;  Laterality: N/A;  ? PIP JOINT FUSION Bilateral   ? pinky  ? TEE WITHOUT CARDIOVERSION N/A 06/14/2019  ? Procedure: TRANSESOPHAGEAL ECHOCARDIOGRAM (TEE);  Surgeon: [name], MD;  Location: [place];  Service: Open Heart Surgery;  Laterality: N/A;  ? ? ?There were no vitals filed for this visit. ? ? Subjective Assessment - 04/23/21 1023   ? ? Subjective Patient reports that she was out of the sling most of the day over the weekend and she is really sore. She worked some of the soreness out yesterday.   ? Currently in Pain? No/denies   ? Pain Score 0-No pain   ? Pain Location Shoulder   ? Pain Orientation Left   ? Pain Descriptors / Indicators Tightness;Sore   ? Pain Type Surgical pain;Chronic pain   ? ?  ?  ? ?  ? ? ? ? ? ? ? ? ? ? ? ? ? ? ? ? ? ? ? ? [place] Adult PT Treatment/Exercise - 04/23/21 0001   ? ?  ? Therapeutic Activites   ? Therapeutic Activities Other Therapeutic Activities   ? Other Therapeutic Activities myofacial ball release work standing   ?  ? Neck Exercises: Stretches  ? Upper Trapezius Stretch 20 seconds;3 reps   ? Other Neck Stretches levator stretch x30 sec   ?  ? Shoulder Exercises: Supine  ? Other Supine Exercises scap squeeze 10 sec x 7 reps   ?  ? Shoulder Exercises: Standing  ? Other Standing Exercises scap squeeze w/ noodle along spine 5 sec x 10 reps   ?  ? Shoulder Exercises: ROM/Strengthening  ? Pendulum flex/ext' circles CW/CCW x 30 each   ?  ? Shoulder Exercises: Stretch  ? External Rotation Stretch 5 reps;10 seconds   noodle along spine elbow  at side using cane  ? Table Stretch - Flexion 5 reps;10 seconds   ? Table Stretch -Flexion Limitations 5 sec hold   ? Other Shoulder Stretches shoulder flexion assisting with Rt UE supine 10 sec hold x 5 reps   ?  ? Moist Heat Therapy  ? Number Minutes Moist Heat 10 Minutes   ? Moist Heat Location Cervical   ?  ? Cryotherapy  ? Number Minutes Cryotherapy 10 Minutes   ? Cryotherapy Location Shoulder   ? Type of Cryotherapy Ice pack   ?  ? Manual Therapy  ? Soft tissue mobilization working through the upper trap and clavicular area; pecs; posterior shoulder girdle musculature   ? Scapular Mobilization  Lt   ? Passive ROM PROM Lt shoulder flexion to 90 deg; ER to ~ 15 deg per protocol; elbow flexion/extension; forearm sup/pronation   ? ?  ?  ? ?  ? ? ? ? ? ? ? ? ? ? PT Education - 04/23/21 1045   ? ? Education Details HEP   ? Person(s) Educated Patient   ? Methods Explanation;Demonstration;Tactile cues;Verbal cues;Handout   ? Comprehension Verbalized understanding;Returned demonstration;Verbal cues required;Tactile cues required   ? ?  ?  ? ?  ? ? ? PT Short Term Goals - 04/10/21 1301   ? ?  ? PT SHORT TERM GOAL #1  ? Title Pt will be independent with HEP   ? Time 6   ? Period Weeks   ? Status New   ? Target Date 05/22/21   ?  ? PT SHORT TERM GOAL #2  ? Title Pt will have full AROM in L shoulder for light ADLs such as feeding and doing her hair   ? Time 6   ? Period Weeks   ? Status New   ? Target Date 05/22/21   ?  ? PT SHORT TERM GOAL #3  ? Title Pt will be independent with maintaining postural stability for good scapulohumeral rhythm   ? Time 6   ? Period Weeks   ? Status New   ? Target Date 05/22/21   ? ?  ?  ? ?  ? ? ? ? PT Long Term Goals - 04/10/21 1304   ? ?  ? PT LONG TERM GOAL #1  ? Title The patient will be indep with HEP progression.   ? Time 12   ? Period Weeks   ? Status New   ? Target Date 07/03/21   ?  ? PT LONG TERM GOAL #2  ? Title Pt will have full pain free shoulder ROM to perform all of her normal  tasks   ? Time 12   ? Period Weeks   ? Status New   ? Target Date 07/03/21   ?  ? PT LONG TERM GOAL #3  ? Title Pt will be able to lift and carry at least 5# overhead to demo improved UE functional strength

## 2021-04-26 ENCOUNTER — Encounter: Payer: Self-pay | Admitting: Rehabilitative and Restorative Service Providers"

## 2021-04-26 ENCOUNTER — Ambulatory Visit: Payer: BC Managed Care – PPO | Admitting: Rehabilitative and Restorative Service Providers"

## 2021-04-26 DIAGNOSIS — M25612 Stiffness of left shoulder, not elsewhere classified: Secondary | ICD-10-CM

## 2021-04-26 DIAGNOSIS — R293 Abnormal posture: Secondary | ICD-10-CM

## 2021-04-26 DIAGNOSIS — M6281 Muscle weakness (generalized): Secondary | ICD-10-CM

## 2021-04-26 DIAGNOSIS — R600 Localized edema: Secondary | ICD-10-CM

## 2021-04-26 DIAGNOSIS — G8929 Other chronic pain: Secondary | ICD-10-CM | POA: Diagnosis not present

## 2021-04-26 DIAGNOSIS — M25512 Pain in left shoulder: Secondary | ICD-10-CM | POA: Diagnosis not present

## 2021-04-26 NOTE — Therapy (Signed)
[facility] ?[facility] ?[address] ?[place], [name], [address] ?Phone: [phone]   Fax:  [phone] ? ?Physical Therapy Treatment ? ?Patient Details  ?Name: Tonya Chandler ?MRN: [id number] ?Date of Birth: March 06, 1960 ?Referring Provider (PT): Dr. Tamera Punt ? ? ?Encounter Date: 04/26/2021 ? ? PT End of Session - 04/26/21 1027   ? ? Visit Number 6   ? Number of Visits 24   ? Date for PT Re-Evaluation 07/03/21   ? Authorization Type BCBS   ? PT Start Time 1015   ? PT Stop Time 9381   ? PT Time Calculation (min) 48 min   ? Activity Tolerance Patient tolerated treatment well   ? ?  ?  ? ?  ? ? ?Past Medical History:  ?Diagnosis Date  ? Abnormal ECG 04/01/2018  ? Abnormal stress echocardiogram 05/24/2019  ? Formatting of this note might be different from the original. Added automatically from request for surgery [phone]  ? Abnormal stress test 05/24/2019  ? Formatting of this note might be different from the original. Added automatically from request for surgery [phone]  ? Anemia   ? Angina pectoris ([place]) 06/04/2019  ? Anxiety   ? Atherosclerosis of native coronary artery with angina pectoris ([place]) 05/10/2015  ? Formatting of this note might be different from the original. 30% lesion-Chiu  ? Bilateral carpal tunnel syndrome 05/23/2017  ? Bilateral knee pain 02/16/2010  ? Qualifier: Diagnosis of  By: Koleen Nimrod MD, Dellis Filbert    ? Carpal tunnel syndrome, right 12/16/2017  ? Colon polyps   ? Depression   ? DEPRESSION 02/16/2010  ? Qualifier: Diagnosis of  By: Koleen Nimrod MD, Dellis Filbert    ? Depression, major, in partial remission ([place]) 07/14/2015  ? Diabetes mellitus   ? Diverticulosis   ? Dupuytren's contracture of right hand 06/24/2017  ? Essential hypertension 02/16/2010  ? Qualifier: Diagnosis of  By: Koleen Nimrod MD, Dellis Filbert    ? Gastro-esophageal reflux   ? Gastrointestinal ulcer   ? Generalized anxiety disorder 03/14/2015  ? GERD 02/16/2010  ? Qualifier: Diagnosis of  By: Koleen Nimrod MD, Dellis Filbert    ? H/O:  GI bleed   ? Michela Pitcher it was due to taking metformin  ? Hyperlipidemia   ? Hyperlipidemia LDL goal <70 02/16/2010  ? Qualifier: Diagnosis of  By: Koleen Nimrod MD, Dellis Filbert    ? Hypertension   ? IBS (irritable bowel syndrome)   ? Impingement syndrome of shoulder, right 06/24/2017  ? Midline low back pain with left-sided sciatica 12/29/2013  ? Musculoskeletal chest pain 05/04/2015  ? Obesity   ? Post-nasal drainage 02/10/2020  ? Last Assessment & Plan:  Formatting of this note might be different from the original. Concern over postnasal drainage. Throat awareness symptoms ever since she was packed in the right nostril last summer.  Was treated with antibiotics with some improvement.  Causes intermittent hoarseness and loss of voice.  Non-smoker. EXAM by anterior rhinoscopy shows no obvious polyps or purulence or obstructi  ? Precordial pain 05/04/2015  ? Primary insomnia 03/14/2015  ? Radiculitis of right cervical region 01/20/2018  ? Radiculopathy 04/02/2018  ? Right hand pain 06/24/2017  ? RLS (restless legs syndrome) 07/14/2015  ? Type 2 diabetes mellitus with diabetic neuropathy, with long-term current use of insulin ([place]) 02/16/2010  ? Qualifier: Diagnosis of  By: Koleen Nimrod MD, Dellis Filbert    ? Unstable angina ([place]) 06/11/2019  ? ? ?Past Surgical History:  ?Procedure Laterality Date  ? ABDOMINAL HYSTERECTOMY    ? ANTERIOR CERVICAL  DECOMP/DISCECTOMY FUSION N/A 04/02/2018  ? Procedure: ANTERIOR CERVICAL DECOMPRESSION FUSION, CERVICAL FOUR-FIVE, CERVICAL FIVE-SIX, WITH INSTRUMENTATION AND ALLOGRAFT;  Surgeon: Phylliss Bob, MD;  Location: [facility];  Service: Orthopedics;  Laterality: N/A;  ? COLONOSCOPY WITH ESOPHAGOGASTRODUODENOSCOPY (EGD)  05/04/2009  ? CORONARY ARTERY BYPASS GRAFT N/A 06/14/2019  ? Procedure: CORONARY ARTERY BYPASS GRAFTING (CABG) using LIMA to LAD; Endoscopic Right Greater Saphenous Vein Harvest: SVG to Circ (distal); SVG to RCA (distal).;  Surgeon: [name], MD;  Location: [facility];  Service: Open Heart Surgery;   Laterality: N/A;  ? ENDOVEIN HARVEST OF GREATER SAPHENOUS VEIN Right 06/14/2019  ? Procedure: [place] Of Greater Saphenous Vein;  Surgeon: [name], MD;  Location: [facility];  Service: Open Heart Surgery;  Laterality: Right;  ? LEFT HEART CATH AND CORONARY ANGIOGRAPHY N/A 06/11/2019  ? Procedure: LEFT HEART CATH AND CORONARY ANGIOGRAPHY;  Surgeon: Wellington Hampshire, MD;  Location: [facility];  Service: Cardiovascular;  Laterality: N/A;  ? PIP JOINT FUSION Bilateral   ? pinky  ? TEE WITHOUT CARDIOVERSION N/A 06/14/2019  ? Procedure: TRANSESOPHAGEAL ECHOCARDIOGRAM (TEE);  Surgeon: [name], MD;  Location: [facility];  Service: Open Heart Surgery;  Laterality: N/A;  ? ? ?There were no vitals filed for this visit. ? ? Subjective Assessment - 04/26/21 1028   ? ? Subjective Continued pain and tightness in the Lt UE. Busy with moving and all that is associated with move.   ? Currently in Pain? Yes   ? Pain Score 6    ? Pain Location Shoulder   ? Pain Orientation Left   ? Pain Descriptors / Indicators Tightness;Sore   ? Pain Type Surgical pain;Chronic pain   ? ?  ?  ? ?  ? ? ? ? ? [facility] PT Assessment - 04/26/21 0001   ? ?  ? Assessment  ? Medical Diagnosis Z98.890 (ICD-10-CM) - S/P left rotator cuff repair   ? Referring Provider (PT) Dr. Tamera Punt   ? Hand Dominance Left   ?  ? Palpation  ? Palpation comment significant muscular tightness in the Lt shoulder girdle through the pecs; anterior deltoid; biceps; upper trap; leveator; teres   ? ?  ?  ? ?  ? ? ? ? ? ? ? ? ? ? ? ? ? ? ? ? [facility] Adult PT Treatment/Exercise - 04/26/21 0001   ? ?  ? Therapeutic Activites   ? Therapeutic Activities Other Therapeutic Activities   ? Other Therapeutic Activities myofacial ball release work standing   ?  ? Neck Exercises: Stretches  ? Upper Trapezius Stretch 20 seconds;3 reps   ? Other Neck Stretches levator stretch x30 sec   ?  ? Shoulder Exercises: Supine  ? Other Supine Exercises scap squeeze 10 sec x 7 reps   ?  ?  Shoulder Exercises: Standing  ? Other Standing Exercises scap squeeze w/ noodle along spine 5 sec x 10 reps   ?  ? Shoulder Exercises: ROM/Strengthening  ? Pendulum flex/ext circles CW/CCW x 30 each   ?  ? Shoulder Exercises: Stretch  ? External Rotation Stretch 5 reps;10 seconds   noodle along spine elbow at side using cane  ? Table Stretch - Flexion 5 reps;10 seconds   ? Table Stretch -Flexion Limitations 5 sec hold   ? Other Shoulder Stretches shoulder flexion assisting with Rt UE supine 10 sec hold x 5 reps   ?  ? Vasopneumatic  ? Number Minutes Vasopneumatic  10  minutes   ? Vasopnuematic Location  Shoulder   ? Vasopneumatic Pressure Low   ? Vasopneumatic Temperature  34?   ?  ? Manual Therapy  ? Soft tissue mobilization working through the upper trap and clavicular area; pecs; posterior shoulder girdle musculature   ? Scapular Mobilization Lt   ? Passive ROM PROM Lt shoulder flexion to 90 deg; ER to ~ 15 deg per protocol; elbow flexion/extension; forearm sup/pronation   ? ?  ?  ? ?  ? ? ? ? ? ? ? ? ? ? ? ? PT Short Term Goals - 04/10/21 1301   ? ?  ? PT SHORT TERM GOAL #1  ? Title Pt will be independent with HEP   ? Time 6   ? Period Weeks   ? Status New   ? Target Date 05/22/21   ?  ? PT SHORT TERM GOAL #2  ? Title Pt will have full AROM in L shoulder for light ADLs such as feeding and doing her hair   ? Time 6   ? Period Weeks   ? Status New   ? Target Date 05/22/21   ?  ? PT SHORT TERM GOAL #3  ? Title Pt will be independent with maintaining postural stability for good scapulohumeral rhythm   ? Time 6   ? Period Weeks   ? Status New   ? Target Date 05/22/21   ? ?  ?  ? ?  ? ? ? ? PT Long Term Goals - 04/10/21 1304   ? ?  ? PT LONG TERM GOAL #1  ? Title The patient will be indep with HEP progression.   ? Time 12   ? Period Weeks   ? Status New   ? Target Date 07/03/21   ?  ? PT LONG TERM GOAL #2  ? Title Pt will have full pain free shoulder ROM to perform all of her normal tasks   ? Time 12   ? Period Weeks    ? Status New   ? Target Date 07/03/21   ?  ? PT LONG TERM GOAL #3  ? Title Pt will be able to lift and carry at least 5# overhead to demo improved UE functional strength   ? Time 12   ? Period Weeks   ?

## 2021-04-30 ENCOUNTER — Ambulatory Visit: Payer: BC Managed Care – PPO | Admitting: Rehabilitative and Restorative Service Providers"

## 2021-04-30 ENCOUNTER — Encounter: Payer: Self-pay | Admitting: Rehabilitative and Restorative Service Providers"

## 2021-04-30 DIAGNOSIS — R600 Localized edema: Secondary | ICD-10-CM | POA: Diagnosis not present

## 2021-04-30 DIAGNOSIS — M25612 Stiffness of left shoulder, not elsewhere classified: Secondary | ICD-10-CM

## 2021-04-30 DIAGNOSIS — M25512 Pain in left shoulder: Secondary | ICD-10-CM | POA: Diagnosis not present

## 2021-04-30 DIAGNOSIS — R293 Abnormal posture: Secondary | ICD-10-CM | POA: Diagnosis not present

## 2021-04-30 DIAGNOSIS — M6281 Muscle weakness (generalized): Secondary | ICD-10-CM | POA: Diagnosis not present

## 2021-04-30 DIAGNOSIS — G8929 Other chronic pain: Secondary | ICD-10-CM | POA: Diagnosis not present

## 2021-04-30 NOTE — Therapy (Signed)
[facility] ?[facility] ?[address] ?[place], [name], [address] ?Phone: [phone]   Fax:  [phone] ? ?Physical Therapy Treatment ? ?Patient Details  ?Name: Tonya Chandler ?MRN: [id number] ?Date of Birth: [birth year] ?Referring Provider (PT): Dr. Tania Ade ? ? ?Encounter Date: 04/30/2021 ? ? PT End of Session - 04/30/21 1103   ? ? Visit Number 7   ? Number of Visits 24   ? Date for PT Re-Evaluation 07/03/21   ? Authorization Type BCBS   ? PT Start Time 1100   ? PT Stop Time 1148   ? PT Time Calculation (min) 48 min   ? Activity Tolerance Patient tolerated treatment well   ? ?  ?  ? ?  ? ? ?Past Medical History:  ?Diagnosis Date  ? Abnormal ECG 04/01/2018  ? Abnormal stress echocardiogram 05/24/2019  ? Formatting of this note might be different from the original. Added automatically from request for surgery [phone]  ? Abnormal stress test 05/24/2019  ? Formatting of this note might be different from the original. Added automatically from request for surgery [phone]  ? Anemia   ? Angina pectoris ([place]) 06/04/2019  ? Anxiety   ? Atherosclerosis of native coronary artery with angina pectoris ([place]) 05/10/2015  ? Formatting of this note might be different from the original. 30% lesion-Chiu  ? Bilateral carpal tunnel syndrome 05/23/2017  ? Bilateral knee pain 02/16/2010  ? Qualifier: Diagnosis of  By: Koleen Nimrod MD, Dellis Filbert    ? Carpal tunnel syndrome, right 12/16/2017  ? Colon polyps   ? Depression   ? DEPRESSION 02/16/2010  ? Qualifier: Diagnosis of  By: Koleen Nimrod MD, Dellis Filbert    ? Depression, major, in partial remission ([place]) 07/14/2015  ? Diabetes mellitus   ? Diverticulosis   ? Dupuytren's contracture of right hand 06/24/2017  ? Essential hypertension 02/16/2010  ? Qualifier: Diagnosis of  By: Koleen Nimrod MD, Dellis Filbert    ? Gastro-esophageal reflux   ? Gastrointestinal ulcer   ? Generalized anxiety disorder 03/14/2015  ? GERD 02/16/2010  ? Qualifier: Diagnosis of  By: Koleen Nimrod MD, Dellis Filbert     ? H/O: GI bleed   ? Michela Pitcher it was due to taking metformin  ? Hyperlipidemia   ? Hyperlipidemia LDL goal <70 02/16/2010  ? Qualifier: Diagnosis of  By: Koleen Nimrod MD, Dellis Filbert    ? Hypertension   ? IBS (irritable bowel syndrome)   ? Impingement syndrome of shoulder, right 06/24/2017  ? Midline low back pain with left-sided sciatica 12/29/2013  ? Musculoskeletal chest pain 05/04/2015  ? Obesity   ? Post-nasal drainage 02/10/2020  ? Last Assessment & Plan:  Formatting of this note might be different from the original. Concern over postnasal drainage. Throat awareness symptoms ever since she was packed in the right nostril last summer.  Was treated with antibiotics with some improvement.  Causes intermittent hoarseness and loss of voice.  Non-smoker. EXAM by anterior rhinoscopy shows no obvious polyps or purulence or obstructi  ? Precordial pain 05/04/2015  ? Primary insomnia 03/14/2015  ? Radiculitis of right cervical region 01/20/2018  ? Radiculopathy 04/02/2018  ? Right hand pain 06/24/2017  ? RLS (restless legs syndrome) 07/14/2015  ? Type 2 diabetes mellitus with diabetic neuropathy, with long-term current use of insulin ([place]) 02/16/2010  ? Qualifier: Diagnosis of  By: Koleen Nimrod MD, Dellis Filbert    ? Unstable angina ([place]) 06/11/2019  ? ? ?Past Surgical History:  ?Procedure Laterality Date  ? ABDOMINAL HYSTERECTOMY    ? ANTERIOR  CERVICAL DECOMP/DISCECTOMY FUSION N/A 04/02/2018  ? Procedure: ANTERIOR CERVICAL DECOMPRESSION FUSION, CERVICAL FOUR-FIVE, CERVICAL FIVE-SIX, WITH INSTRUMENTATION AND ALLOGRAFT;  Surgeon: Phylliss Bob, MD;  Location: [facility];  Service: Orthopedics;  Laterality: N/A;  ? COLONOSCOPY WITH ESOPHAGOGASTRODUODENOSCOPY (EGD)  05/04/2009  ? CORONARY ARTERY BYPASS GRAFT N/A 06/14/2019  ? Procedure: CORONARY ARTERY BYPASS GRAFTING (CABG) using LIMA to LAD; Endoscopic Right Greater Saphenous Vein Harvest: SVG to Circ (distal); SVG to RCA (distal).;  Surgeon: [name], MD;  Location: [facility];  Service: Open Heart  Surgery;  Laterality: N/A;  ? ENDOVEIN HARVEST OF GREATER SAPHENOUS VEIN Right 06/14/2019  ? Procedure: [place] Of Greater Saphenous Vein;  Surgeon: [name], MD;  Location: [facility];  Service: Open Heart Surgery;  Laterality: Right;  ? LEFT HEART CATH AND CORONARY ANGIOGRAPHY N/A 06/11/2019  ? Procedure: LEFT HEART CATH AND CORONARY ANGIOGRAPHY;  Surgeon: Wellington Hampshire, MD;  Location: [facility];  Service: Cardiovascular;  Laterality: N/A;  ? PIP JOINT FUSION Bilateral   ? pinky  ? TEE WITHOUT CARDIOVERSION N/A 06/14/2019  ? Procedure: TRANSESOPHAGEAL ECHOCARDIOGRAM (TEE);  Surgeon: [name], MD;  Location: [facility];  Service: Open Heart Surgery;  Laterality: N/A;  ? ? ?There were no vitals filed for this visit. ? ? Subjective Assessment - 04/30/21 1103   ? ? Subjective Patient reports that she has continued to work at home packing to move the end of the month. She has been out of her sling most of the weekend. She has increased pain in the Lt anterior shoulder and into the arm(biceps)   ? Currently in Pain? Yes   ? Pain Score 6    ? Pain Location Shoulder   ? Pain Orientation Left   ? Pain Descriptors / Indicators Tightness;Sore   ? Pain Type Surgical pain;Chronic pain   ? Pain Onset More than a month ago   ? Pain Frequency Intermittent   ? Aggravating Factors  using arm   ? Pain Relieving Factors meds; rest   ? ?  ?  ? ?  ? ? ? ? ? [facility] PT Assessment - 04/30/21 0001   ? ?  ? Assessment  ? Medical Diagnosis Z98.890 (ICD-10-CM) - S/P left rotator cuff repair   ? Referring Provider (PT) Dr. Tania Ade   ? Hand Dominance Left   ? Next MD Visit 04/30/21   ? Prior Therapy here for shoulder and for neck   ?  ? PROM  ? Overall PROM Comments PROM within tissue tolerance - no pain - pt supine   ? Left Shoulder Extension 30 Degrees   ? Left Shoulder Flexion 150 Degrees   ? Left Shoulder ABduction 157 Degrees   in scapular plane  ? Left Shoulder Internal Rotation 60 Degrees   in scapular  plane  ? Left Shoulder External Rotation 75 Degrees   in scapular plane  ?  ? Palpation  ? Palpation comment continued muscular tightness in the Lt shoulder girdle through the pecs; anterior deltoid; biceps; upper trap; leveator; teres   ? ?  ?  ? ?  ? ? ? ? ? ? ? ? ? ? ? ? ? ? ? ? [facility] Adult PT Treatment/Exercise - 04/30/21 0001   ? ?  ? Therapeutic Activites   ? Therapeutic Activities Other Therapeutic Activities   ? Other Therapeutic Activities myofacial ball release work standing   ?  ? Neck Exercises: Stretches  ? Upper Trapezius Stretch  20 seconds;3 reps   ? Other Neck Stretches levator stretch x30 sec   ?  ? Shoulder Exercises: Supine  ? Other Supine Exercises scap squeeze 10 sec x 7 reps   ?  ? Shoulder Exercises: Standing  ? Other Standing Exercises scap squeeze w/ noodle along spine 5 sec x 10 reps   ?  ? Shoulder Exercises: ROM/Strengthening  ? Pendulum flex/ext circles CW/CCW x 30 each   ?  ? Shoulder Exercises: Stretch  ? External Rotation Stretch 5 reps;10 seconds   noodle along spine elbow at side using cane  ? Table Stretch - Flexion 5 reps;10 seconds   ? Table Stretch -Flexion Limitations 5 sec hold   ? Other Shoulder Stretches shoulder flexion assisting with Rt UE supine 10 sec hold x 5 reps   ?  ? Vasopneumatic  ? Number Minutes Vasopneumatic  10 minutes   ? Vasopnuematic Location  Shoulder   ? Vasopneumatic Pressure Low   ? Vasopneumatic Temperature  34?   ?  ? Manual Therapy  ? Soft tissue mobilization working through the upper trap and clavicular area; pecs; posterior shoulder girdle musculature   ? Scapular Mobilization Lt   ? Passive ROM PROM Lt shoulder flexion to 90 deg; ER to ~ 15 deg per protocol; elbow flexion/extension; forearm sup/pronation   ? ?  ?  ? ?  ? ? ? ? ? ? ? ? ? ? ? ? PT Short Term Goals - 04/10/21 1301   ? ?  ? PT SHORT TERM GOAL #1  ? Title Pt will be independent with HEP   ? Time 6   ? Period Weeks   ? Status New   ? Target Date 05/22/21   ?  ? PT SHORT TERM GOAL #2  ?  Title Pt will have full AROM in L shoulder for light ADLs such as feeding and doing her hair   ? Time 6   ? Period Weeks   ? Status New   ? Target Date 05/22/21   ?  ? PT SHORT TERM GOAL #3  ? Title Pt will be

## 2021-05-01 ENCOUNTER — Other Ambulatory Visit: Payer: Self-pay | Admitting: Physician Assistant

## 2021-05-01 ENCOUNTER — Encounter: Payer: Self-pay | Admitting: Cardiology

## 2021-05-02 ENCOUNTER — Other Ambulatory Visit: Payer: Self-pay | Admitting: *Deleted

## 2021-05-02 MED ORDER — LISINOPRIL 5 MG PO TABS: 5.0000 mg | ORAL_TABLET | Freq: Two times a day (BID) | ORAL | 2 refills | Status: AC

## 2021-05-03 ENCOUNTER — Ambulatory Visit: Payer: BC Managed Care – PPO | Admitting: Physical Therapy

## 2021-05-03 ENCOUNTER — Encounter: Payer: Self-pay | Admitting: Physical Therapy

## 2021-05-03 DIAGNOSIS — G8929 Other chronic pain: Secondary | ICD-10-CM

## 2021-05-03 DIAGNOSIS — R600 Localized edema: Secondary | ICD-10-CM | POA: Diagnosis not present

## 2021-05-03 DIAGNOSIS — M6281 Muscle weakness (generalized): Secondary | ICD-10-CM

## 2021-05-03 DIAGNOSIS — R293 Abnormal posture: Secondary | ICD-10-CM | POA: Diagnosis not present

## 2021-05-03 DIAGNOSIS — M25512 Pain in left shoulder: Secondary | ICD-10-CM | POA: Diagnosis not present

## 2021-05-03 DIAGNOSIS — M25612 Stiffness of left shoulder, not elsewhere classified: Secondary | ICD-10-CM | POA: Diagnosis not present
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
## 2021-05-07 ENCOUNTER — Encounter: Payer: Self-pay | Admitting: Rehabilitative and Restorative Service Providers"

## 2021-05-07 ENCOUNTER — Ambulatory Visit (INDEPENDENT_AMBULATORY_CARE_PROVIDER_SITE_OTHER): Payer: BC Managed Care – PPO | Admitting: Physician Assistant

## 2021-05-07 ENCOUNTER — Encounter: Payer: Self-pay | Admitting: Physician Assistant

## 2021-05-07 ENCOUNTER — Ambulatory Visit: Payer: BC Managed Care – PPO | Admitting: Rehabilitative and Restorative Service Providers"

## 2021-05-07 VITALS — BP 148/86 | HR 83 | Ht 60.0 in | Wt 161.0 lb

## 2021-05-07 DIAGNOSIS — B009 Herpesviral infection, unspecified: Secondary | ICD-10-CM | POA: Diagnosis not present

## 2021-05-07 DIAGNOSIS — R293 Abnormal posture: Secondary | ICD-10-CM | POA: Diagnosis not present

## 2021-05-07 DIAGNOSIS — M25512 Pain in left shoulder: Secondary | ICD-10-CM | POA: Diagnosis not present

## 2021-05-07 DIAGNOSIS — M25612 Stiffness of left shoulder, not elsewhere classified: Secondary | ICD-10-CM | POA: Diagnosis not present

## 2021-05-07 DIAGNOSIS — R600 Localized edema: Secondary | ICD-10-CM

## 2021-05-07 DIAGNOSIS — E1165 Type 2 diabetes mellitus with hyperglycemia: Secondary | ICD-10-CM

## 2021-05-07 DIAGNOSIS — G8929 Other chronic pain: Secondary | ICD-10-CM | POA: Diagnosis not present

## 2021-05-07 DIAGNOSIS — Z794 Long term (current) use of insulin: Secondary | ICD-10-CM

## 2021-05-07 DIAGNOSIS — M6281 Muscle weakness (generalized): Secondary | ICD-10-CM

## 2021-05-07 DIAGNOSIS — E1140 Type 2 diabetes mellitus with diabetic neuropathy, unspecified: Secondary | ICD-10-CM | POA: Diagnosis not present

## 2021-05-07 DIAGNOSIS — M7541 Impingement syndrome of right shoulder: Secondary | ICD-10-CM

## 2021-05-07 LAB — POCT GLYCOSYLATED HEMOGLOBIN (HGB A1C): Hemoglobin A1C: 8.3 % — AB (ref 4.0–5.6)

## 2021-05-07 MED ORDER — DAPAGLIFLOZIN PROPANEDIOL 10 MG PO TABS: 10.0000 mg | ORAL_TABLET | Freq: Every day | ORAL | 0 refills | Status: AC

## 2021-05-07 MED ORDER — VALACYCLOVIR HCL 500 MG PO TABS: 500.0000 mg | ORAL_TABLET | Freq: Two times a day (BID) | ORAL | 3 refills | Status: AC | PRN

## 2021-05-07 MED ORDER — TRULICITY 4.5 MG/0.5ML ~~LOC~~ SOAJ: 4.5000 mg | SUBCUTANEOUS | 0 refills | Status: AC

## 2021-05-07 NOTE — Progress Notes (Addendum)
? ?Subjective:  ? ? Patient ID: Tonya Chandler, female    DOB: 09-21-1960, 61 y.o.   MRN: [id number] ? ?HPI ?Pt is a 61 yo obese female with T2DM, HTN, GERD, HLD, HSV-2, CAD with CABG who presents to the clinic foe 3 month follow up.  ? ?She has been using the [place] and tracking her sugars. They are peaking in late afternoon and evening. She is using long acting, meal time, trulicity, farxiga. She is compliant. She denies any leg pain or cramping. She denies any hypoglycemic symptoms. She is not exercising or watching her diet.  ? ?[place] She is concerned about finding a provider there and may come back here for a while.  ?.. ?Active Ambulatory Problems  ?  Diagnosis Date Noted  ? Type 2 diabetes mellitus with diabetic neuropathy, with long-term current use of insulin ([place]) 02/16/2010  ? Essential hypertension 02/16/2010  ? GERD 02/16/2010  ? Bilateral knee pain 02/16/2010  ? Impingement syndrome of shoulder, right 06/24/2017  ? Depression, major, in partial remission ([place]) 07/14/2015  ? Generalized anxiety disorder 03/14/2015  ? Primary insomnia 03/14/2015  ? RLS (restless legs syndrome) 07/14/2015  ? Angina pectoris ([place]) 06/04/2019  ? S/P CABG x 3 LIMA to LAD, SVG sequential to RCA and circumflex in May 2021   ? Obesity   ? IBS (irritable bowel syndrome)   ? Hypertension   ? Hyperlipidemia   ? H/O: GI bleed   ? Gastrointestinal ulcer   ? Gastro-esophageal reflux   ? Diverticulosis   ? Depression   ? Anxiety   ? Post-nasal drainage 02/10/2020  ? HSV-2 (herpes simplex virus 2) infection - genital, (+)viral culture 08/2020 09/11/2020  ? Acute meniscal tear of right knee 11/07/2020  ? Grief 11/07/2020  ? ?Resolved Ambulatory Problems  ?  Diagnosis Date Noted  ? Hyperlipidemia LDL goal <70 02/16/2010  ? DEPRESSION 02/16/2010  ? Right hand pain 06/24/2017  ? Dupuytren's contracture of right hand 06/24/2017  ? Carpal tunnel syndrome, right 12/16/2017  ?  Radiculitis of right cervical region 01/20/2018  ? Radiculopathy 04/02/2018  ? Abnormal ECG 04/01/2018  ? Abnormal stress test 05/24/2019  ? Atherosclerosis of native coronary artery with angina pectoris ([place]) 05/10/2015  ? Midline low back pain with left-sided sciatica 12/29/2013  ? Musculoskeletal chest pain 05/04/2015  ? Precordial pain 05/04/2015  ? Bilateral carpal tunnel syndrome 05/23/2017  ? Unstable angina ([place]) 06/11/2019  ? Colon polyps   ? Anemia   ? Abnormal stress echocardiogram 05/24/2019  ? ?Past Medical History:  ?Diagnosis Date  ? Diabetes mellitus   ? ? ? ? ?Review of Systems ? ?  See HPI.  ?Objective:  ? Physical Exam ?Vitals reviewed.  ?Constitutional:   ?   Appearance: Normal appearance. She is obese.  ?HENT:  ?   Head: Normocephalic.  ?   Right Ear: Tympanic membrane normal.  ?   Left Ear: Tympanic membrane normal.  ?   Mouth/Throat:  ?   Mouth: Mucous membranes are moist.  ?Neck:  ?   Vascular: No carotid bruit.  ?Cardiovascular:  ?   Rate and Rhythm: Normal rate and regular rhythm.  ?   Pulses: Normal pulses.  ?Pulmonary:  ?   Effort: Pulmonary effort is normal.  ?   Breath sounds: Normal breath sounds. No rhonchi.  ?Musculoskeletal:  ?   Right lower leg: No edema.  ?  Left lower leg: No edema.  ?Lymphadenopathy:  ?   Cervical: No cervical adenopathy.  ?Neurological:  ?   General: No focal deficit present.  ?   Mental Status: She is alert and oriented to person, place, and time.  ?Psychiatric:     ?   Mood and Affect: Mood normal.  ? ? ? ? ?.. ? ?  05/07/2021  ? 11:23 AM 11/18/2019  ?  3:41 PM 01/04/2019  ?  1:49 PM 09/30/2018  ? 12:11 PM  ?Depression screen PHQ 2/9  ?Decreased Interest 1 0 1 1  ?Down, Depressed, Hopeless 1 0 1 1  ?PHQ - 2 Score 2 0 2 2  ?Altered sleeping   1 1  ?Tired, decreased energy   0 0  ?Change in appetite   0 0  ?Feeling bad or failure about yourself    0 0  ?Trouble concentrating   0 0  ?Moving slowly or fidgety/restless   0 0  ?Suicidal thoughts   0 0  ?PHQ-9 Score    3 3  ?Difficult doing work/chores   Not difficult at all Somewhat difficult  ? ?.. ? ?  01/04/2019  ?  1:49 PM 09/30/2018  ? 12:12 PM  ?GAD 7 : Generalized Anxiety Score  ?Nervous, Anxious, on Edge 0 1  ?Control/stop worrying 0 0  ?Worry too much - different things 0 1  ?Trouble relaxing 0 0  ?Restless 0 0  ?Easily annoyed or irritable 1 1  ?Afraid - awful might happen 0 0  ?Total GAD 7 Score 1 3  ?Anxiety Difficulty Not difficult at all Somewhat difficult  ? ? ? ?   ?Assessment & Plan:  ?..Lulie was seen today for follow-up. ? ?Diagnoses and all orders for this visit: ? ?Uncontrolled type 2 diabetes mellitus with hyperglycemia (HCC) ?-     dapagliflozin propanediol (FARXIGA) 10 MG TABS tablet; Take 1 tablet (10 mg total) by mouth daily. ? ?Type 2 diabetes mellitus with diabetic neuropathy, with long-term current use of insulin (HCC) ?-     POCT glycosylated hemoglobin (Hb A1C) ?-     dapagliflozin propanediol (FARXIGA) 10 MG TABS tablet; Take 1 tablet (10 mg total) by mouth daily. ?-     Dulaglutide (TRULICITY) 4.5 IY/6.4BR SOPN; Inject 4.5 mg into the skin once a week. ? ?HSV-2 (herpes simplex virus 2) infection - genital, (+)viral culture 08/2020 ?-     valACYclovir (VALTREX) 500 MG tablet; Take 1 tablet (500 mg total) by mouth 2 (two) times daily as needed. ? ?Impingement syndrome of shoulder, right ? ? ?A1C is not to goal ?Increase semglee to 80 units at bedtime and 10 units in the morning ?Increase meal time insulin to 12 units after lunch and dinner ?Continue trulicity 4.'5mg'$  weekly and farxiga daily.  ?BP not to goal. Managed by cardiology. Discussed goal of under 130/80. If BP staying elevated then needs to follow up for adjustment.  ?On plavix and lipitor.  ?Needs eye exam.  ?Covid vaccine x3. ?Flu/pneumonia vaccine UTD.  ?Shingles vaccine UTD.  ? ?Added valtrex daily for prevention of HSV outbreak.  ? ?Follow up in 3 months.  ? ?

## 2021-05-07 NOTE — Patient Instructions (Signed)
Access Code: [email or web address] ?URL: [email or web address] ?Date: 05/07/2021 ?Prepared by: Gillermo Murdoch ? ?Exercises ?- Flexion-Extension Shoulder Pendulum with Table Support  - 1 x daily - 7 x weekly - 1 sets - 30 sec hold ?- Horizontal Shoulder Pendulum with Table Support  - 1 x daily - 7 x weekly - 1 sets - 30 sec hold ?- Circular Shoulder Pendulum with Table Support  - 1 x daily - 7 x weekly - 1 sets - 30 sec hold ?- Forearm Strengthening with Ball Squeeze  - 1 x daily - 7 x weekly - 3 sets - 10 reps ?- Seated Elbow Flexion and Extension AROM  - 2 x daily - 7 x weekly - 1 sets - 10 reps ?- Wrist Flexion Extension AROM with Fingers Curled and Palm Down  - 2 x daily - 7 x weekly - 1 sets - 10 reps ?- Wrist Radial Ulnar Deviation AROM  - 2 x daily - 7 x weekly - 1 sets - 10 reps ?- Standing Forearm Pronation and Supination AROM  - 2 x daily - 7 x weekly - 1 sets - 10 reps ?- Seated Scapular Retraction  - 2 x daily - 7 x weekly - 1-2 sets - 10 reps - 10 sec  hold ?- Seated Hip Flexor Stretch  - 2 x daily - 7 x weekly - 1 sets - 3 reps - 30 sec  hold ?- Seated Shoulder Flexion Towel Slide at Table Top Full Range of Motion  - 2 x daily - 7 x weekly - 1 sets - 5-10 reps - 5-10sec  hold ?- Supine Shoulder External Rotation with Dowel  - 2 x daily - 7 x weekly - 1 sets - 5 reps - 5 sec  hold ?- Standing Shoulder and Trunk Flexion at Table  - 2 x daily - 7 x weekly - 1 sets - 5 reps - 5 sec  hold ?- Supine Shoulder Flexion AAROM with Dowel  - 2 x daily - 7 x weekly - 1 sets - 5-10 reps - 5-10 sec  hold ?- Standing Shoulder Extension with Dowel  - 2 x daily - 7 x weekly - 1 sets - 5-10 reps - 5-10 sec  hold ?- Isometric Shoulder Extension at Wall  - 2 x daily - 7 x weekly - 1 sets - 5-10 reps - 5 sec  hold ?- Standing Isometric Shoulder External Rotation with Doorway  - 2 x daily - 7 x weekly - 1 sets - 5-10 reps - 5 sec  hold ?- Standing Isometric Shoulder Internal Rotation at Doorway  - 2 x daily - 7 x weekly - 1 sets  - 10 reps - 5 sec  hold ?- Isometric Shoulder Abduction at Wall  - 2 x daily - 7 x weekly - 1 sets - 5-10 reps - 5 sec  hold ?

## 2021-05-07 NOTE — Therapy (Signed)
[place] ?[facility] ?[place] ?[place], [name], [address] ?Phone: [phone]   Fax:  [phone] ? ?Physical Therapy Treatment ? ?Patient Details  ?Name: Tonya Chandler ?MRN: [id number] ?Date of Birth: [birth year] ?Referring Provider (PT): Dr. Tania Ade ? ? ?Encounter Date: 05/07/2021 ? ? PT End of Session - 05/07/21 1439   ? ? Visit Number 9   ? Number of Visits 24   ? Date for PT Re-Evaluation 07/03/21   ? Authorization Type BCBS   ? PT Start Time 1430   ? PT Stop Time 5465   ? PT Time Calculation (min) 49 min   ? Activity Tolerance Patient tolerated treatment well   ? ?  ?  ? ?  ? ? ?Past Medical History:  ?Diagnosis Date  ? Abnormal ECG 04/01/2018  ? Abnormal stress echocardiogram 05/24/2019  ? Formatting of this note might be different from the original. Added automatically from request for surgery [id number]  ? Abnormal stress test 05/24/2019  ? Formatting of this note might be different from the original. Added automatically from request for surgery [id number]  ? Anemia   ? Angina pectoris ([place]) 06/04/2019  ? Anxiety   ? Atherosclerosis of native coronary artery with angina pectoris ([place]) 05/10/2015  ? Formatting of this note might be different from the original. 30% lesion-Chiu  ? Bilateral carpal tunnel syndrome 05/23/2017  ? Bilateral knee pain 02/16/2010  ? Qualifier: Diagnosis of  By: Koleen Nimrod MD, Dellis Filbert    ? Carpal tunnel syndrome, right 12/16/2017  ? Colon polyps   ? Depression   ? DEPRESSION 02/16/2010  ? Qualifier: Diagnosis of  By: Koleen Nimrod MD, Dellis Filbert    ? Depression, major, in partial remission ([place]) 07/14/2015  ? Diabetes mellitus   ? Diverticulosis   ? Dupuytren's contracture of right hand 06/24/2017  ? Essential hypertension 02/16/2010  ? Qualifier: Diagnosis of  By: Koleen Nimrod MD, Dellis Filbert    ? Gastro-esophageal reflux   ? Gastrointestinal ulcer   ? Generalized anxiety disorder 03/14/2015  ? GERD 02/16/2010  ? Qualifier: Diagnosis of  By: Koleen Nimrod MD, Dellis Filbert     ? H/O: GI bleed   ? Michela Pitcher it was due to taking metformin  ? Hyperlipidemia   ? Hyperlipidemia LDL goal <70 02/16/2010  ? Qualifier: Diagnosis of  By: Koleen Nimrod MD, Dellis Filbert    ? Hypertension   ? IBS (irritable bowel syndrome)   ? Impingement syndrome of shoulder, right 06/24/2017  ? Midline low back pain with left-sided sciatica 12/29/2013  ? Musculoskeletal chest pain 05/04/2015  ? Obesity   ? Post-nasal drainage 02/10/2020  ? Last Assessment & Plan:  Formatting of this note might be different from the original. Concern over postnasal drainage. Throat awareness symptoms ever since she was packed in the right nostril last summer.  Was treated with antibiotics with some improvement.  Causes intermittent hoarseness and loss of voice.  Non-smoker. EXAM by anterior rhinoscopy shows no obvious polyps or purulence or obstructi  ? Precordial pain 05/04/2015  ? Primary insomnia 03/14/2015  ? Radiculitis of right cervical region 01/20/2018  ? Radiculopathy 04/02/2018  ? Right hand pain 06/24/2017  ? RLS (restless legs syndrome) 07/14/2015  ? Type 2 diabetes mellitus with diabetic neuropathy, with long-term current use of insulin ([place]) 02/16/2010  ? Qualifier: Diagnosis of  By: Koleen Nimrod MD, Dellis Filbert    ? Unstable angina ([place]) 06/11/2019  ? ? ?Past Surgical History:  ?Procedure Laterality Date  ? ABDOMINAL HYSTERECTOMY    ? ANTERIOR  CERVICAL DECOMP/DISCECTOMY FUSION N/A 04/02/2018  ? Procedure: ANTERIOR CERVICAL DECOMPRESSION FUSION, CERVICAL FOUR-FIVE, CERVICAL FIVE-SIX, WITH INSTRUMENTATION AND ALLOGRAFT;  Surgeon: Phylliss Bob, MD;  Location: [facility];  Service: Orthopedics;  Laterality: N/A;  ? COLONOSCOPY WITH ESOPHAGOGASTRODUODENOSCOPY (EGD)  05/04/2009  ? CORONARY ARTERY BYPASS GRAFT N/A 06/14/2019  ? Procedure: CORONARY ARTERY BYPASS GRAFTING (CABG) using LIMA to LAD; Endoscopic Right Greater Saphenous Vein Harvest: SVG to Circ (distal); SVG to RCA (distal).;  Surgeon: [name], MD;  Location: [facility];  Service: Open Heart  Surgery;  Laterality: N/A;  ? ENDOVEIN HARVEST OF GREATER SAPHENOUS VEIN Right 06/14/2019  ? Procedure: [place] Of Greater Saphenous Vein;  Surgeon: [name], MD;  Location: [facility];  Service: Open Heart Surgery;  Laterality: Right;  ? LEFT HEART CATH AND CORONARY ANGIOGRAPHY N/A 06/11/2019  ? Procedure: LEFT HEART CATH AND CORONARY ANGIOGRAPHY;  Surgeon: Wellington Hampshire, MD;  Location: [facility];  Service: Cardiovascular;  Laterality: N/A;  ? PIP JOINT FUSION Bilateral   ? pinky  ? TEE WITHOUT CARDIOVERSION N/A 06/14/2019  ? Procedure: TRANSESOPHAGEAL ECHOCARDIOGRAM (TEE);  Surgeon: [name], MD;  Location: [facility];  Service: Open Heart Surgery;  Laterality: N/A;  ? ? ?There were no vitals filed for this visit. ? ? Subjective Assessment - 05/07/21 1440   ? ? Subjective Patient has been driving a lot more since MD released her to drive.She continues to pack for the move the end of the month. She continues to have pain and tightness in the shoulder   ? Pain Score 3    ? Pain Location Shoulder   ? Pain Orientation Left   ? Pain Descriptors / Indicators Tightness;Sore   ? Pain Type Surgical pain;Chronic pain   ? Pain Onset More than a month ago   ? Pain Frequency Intermittent   ? ?  ?  ? ?  ? ? ? ? ? [facility] PT Assessment - 05/07/21 0001   ? ?  ? Assessment  ? Medical Diagnosis Z98.890 (ICD-10-CM) - S/P left rotator cuff repair   ? Referring Provider (PT) Dr. Tania Ade   ? Hand Dominance Left   ? Next MD Visit 04/30/21   ? Prior Therapy here for shoulder and for neck   ?  ? PROM  ? Overall PROM Comments PROM within tissue tolerance - no pain - pt supine   ? Left Shoulder Flexion 152 Degrees   ? Left Shoulder ABduction 155 Degrees   in scapular plane  ? Left Shoulder Internal Rotation 52 Degrees   in scapular plane  ? Left Shoulder External Rotation 68 Degrees   in scapular plane  ?  ? Palpation  ? Palpation comment continued muscular tightness in the Lt shoulder girdle through the  pecs; anterior deltoid; biceps; upper trap; leveator; teres   ? ?  ?  ? ?  ? ? ? ? ? ? ? ? ? ? ? ? ? ? ? ? [facility] Adult PT Treatment/Exercise - 05/07/21 0001   ? ?  ? Neck Exercises: Stretches  ? Upper Trapezius Stretch 20 seconds;3 reps   ? Other Neck Stretches levator stretch x30 sec   ?  ? Shoulder Exercises: Supine  ? Other Supine Exercises scap squeeze 10 sec x 7 reps   ?  ? Shoulder Exercises: Standing  ? Other Standing Exercises scap squeeze w/ noodle along spine 5 sec x 10 reps   ?  ? Shoulder Exercises: Pulleys  ?  Flexion 2 minutes   ?  ? Shoulder Exercises: ROM/Strengthening  ? Pendulum flex/ext circles CW/CCW x 30 each   ?  ? Shoulder Exercises: Isometric Strengthening  ? Extension 5X5"   ? External Rotation 5X5"   ? Internal Rotation 5X5"   ? ABduction 5X5"   ?  ? Shoulder Exercises: Stretch  ? External Rotation Stretch 5 reps;10 seconds   standing with cane  ? Table Stretch - Flexion 5 reps;10 seconds   ? Table Stretch -Flexion Limitations 5 sec hold   ? Other Shoulder Stretches shoulder flexion assisting with Rt UE supine 10 sec hold x 5 reps   ? Other Shoulder Stretches shoulder extension with cane 10 sec hold x 8 reps   ?  ? Moist Heat Therapy  ? Number Minutes Moist Heat 10 Minutes   ? Moist Heat Location Shoulder   ?  ? Manual Therapy  ? Manual therapy comments skilled palpation to assess affects of dry needling and manual work   ? Soft tissue mobilization Lt upper quadrant through the pecs, biceps, anterior deltoid, upper trap   ? Scapular Mobilization Lt   ? Passive ROM PROM Lt shoulder flexion; abduction in scapular plane; ER in scapular plane   ? ?  ?  ? ?  ? ? ? Trigger Point Dry Needling - 05/07/21 0001   ? ? Consent Given? Yes   ? Education Handout Provided Previously provided   ? Dry Needling Comments left   ? Upper Trapezius Response Palpable increased muscle length;Twitch reponse elicited   ? Pectoralis Major Response Palpable increased muscle length   ? Pectoralis Minor Response Palpable  increased muscle length   ? Deltoid Response Twitch response elicited;Palpable increased muscle length   ? Biceps Response Palpable increased muscle length   ? ?  ?  ? ?  ? ? ? ? ? ? ? ? PT Education - 05/07/21

## 2021-05-07 NOTE — Patient Instructions (Signed)
Increase semglee to 80 units at bedtime and 10 units in the morning.  ?Increase meal time insulin to 12 units lunch and dinner ? ? ?

## 2021-05-08 ENCOUNTER — Encounter: Payer: Self-pay | Admitting: Physician Assistant

## 2021-05-08 DIAGNOSIS — E1165 Type 2 diabetes mellitus with hyperglycemia: Secondary | ICD-10-CM | POA: Insufficient documentation

## 2021-05-10 ENCOUNTER — Encounter: Payer: Self-pay | Admitting: Physical Therapy

## 2021-05-11 ENCOUNTER — Ambulatory Visit: Payer: BC Managed Care – PPO | Admitting: Physical Therapy

## 2021-05-11 DIAGNOSIS — M6281 Muscle weakness (generalized): Secondary | ICD-10-CM

## 2021-05-11 DIAGNOSIS — M25612 Stiffness of left shoulder, not elsewhere classified: Secondary | ICD-10-CM

## 2021-05-11 DIAGNOSIS — R293 Abnormal posture: Secondary | ICD-10-CM

## 2021-05-11 DIAGNOSIS — M25512 Pain in left shoulder: Secondary | ICD-10-CM | POA: Diagnosis not present

## 2021-05-11 DIAGNOSIS — R600 Localized edema: Secondary | ICD-10-CM | POA: Diagnosis not present

## 2021-05-11 DIAGNOSIS — G8929 Other chronic pain: Secondary | ICD-10-CM | POA: Diagnosis not present

## 2021-05-11 NOTE — Therapy (Signed)
[place] ?[facility] ?[place] ?[place], [name], [address] ?Phone: [phone]   Fax:  [phone] ? ?Physical Therapy Treatment ? ?Patient Details  ?Name: Tonya Chandler ?MRN: [id number] ?Date of Birth: 1960-03-11 ?Referring Provider (PT): Dr. Tania Ade ? ? ?Encounter Date: 05/11/2021 ? ? PT End of Session - 05/11/21 0945   ? ? Visit Number 10   ? Number of Visits 24   ? Date for PT Re-Evaluation 07/03/21   ? Authorization Type BCBS   ? PT Start Time [phone]   ? PT Stop Time 1016   ? PT Time Calculation (min) 38 min   ? Activity Tolerance Patient tolerated treatment well   ? Behavior During Therapy [facility] for tasks assessed/performed   ? ?  ?  ? ?  ? ? ?Past Medical History:  ?Diagnosis Date  ? Abnormal ECG 04/01/2018  ? Abnormal stress echocardiogram 05/24/2019  ? Formatting of this note might be different from the original. Added automatically from request for surgery [id number]  ? Abnormal stress test 05/24/2019  ? Formatting of this note might be different from the original. Added automatically from request for surgery [id number]  ? Anemia   ? Angina pectoris ([place]) 06/04/2019  ? Anxiety   ? Atherosclerosis of native coronary artery with angina pectoris ([place]) 05/10/2015  ? Formatting of this note might be different from the original. 30% lesion-Chiu  ? Bilateral carpal tunnel syndrome 05/23/2017  ? Bilateral knee pain 02/16/2010  ? Qualifier: Diagnosis of  By: Koleen Nimrod MD, Dellis Filbert    ? Carpal tunnel syndrome, right 12/16/2017  ? Colon polyps   ? Depression   ? DEPRESSION 02/16/2010  ? Qualifier: Diagnosis of  By: Koleen Nimrod MD, Dellis Filbert    ? Depression, major, in partial remission ([place]) 07/14/2015  ? Diabetes mellitus   ? Diverticulosis   ? Dupuytren's contracture of right hand 06/24/2017  ? Essential hypertension 02/16/2010  ? Qualifier: Diagnosis of  By: Koleen Nimrod MD, Dellis Filbert    ? Gastro-esophageal reflux   ? Gastrointestinal ulcer   ? Generalized anxiety disorder 03/14/2015  ? GERD  02/16/2010  ? Qualifier: Diagnosis of  By: Koleen Nimrod MD, Dellis Filbert    ? H/O: GI bleed   ? Michela Pitcher it was due to taking metformin  ? Hyperlipidemia   ? Hyperlipidemia LDL goal <70 02/16/2010  ? Qualifier: Diagnosis of  By: Koleen Nimrod MD, Dellis Filbert    ? Hypertension   ? IBS (irritable bowel syndrome)   ? Impingement syndrome of shoulder, right 06/24/2017  ? Midline low back pain with left-sided sciatica 12/29/2013  ? Musculoskeletal chest pain 05/04/2015  ? Obesity   ? Post-nasal drainage 02/10/2020  ? Last Assessment & Plan:  Formatting of this note might be different from the original. Concern over postnasal drainage. Throat awareness symptoms ever since she was packed in the right nostril last summer.  Was treated with antibiotics with some improvement.  Causes intermittent hoarseness and loss of voice.  Non-smoker. EXAM by anterior rhinoscopy shows no obvious polyps or purulence or obstructi  ? Precordial pain 05/04/2015  ? Primary insomnia 03/14/2015  ? Radiculitis of right cervical region 01/20/2018  ? Radiculopathy 04/02/2018  ? Right hand pain 06/24/2017  ? RLS (restless legs syndrome) 07/14/2015  ? Type 2 diabetes mellitus with diabetic neuropathy, with long-term current use of insulin ([place]) 02/16/2010  ? Qualifier: Diagnosis of  By: Koleen Nimrod MD, Dellis Filbert    ? Unstable angina ([place]) 06/11/2019  ? ? ?Past Surgical History:  ?Procedure Laterality  Date  ? ABDOMINAL HYSTERECTOMY    ? ANTERIOR CERVICAL DECOMP/DISCECTOMY FUSION N/A 04/02/2018  ? Procedure: ANTERIOR CERVICAL DECOMPRESSION FUSION, CERVICAL FOUR-FIVE, CERVICAL FIVE-SIX, WITH INSTRUMENTATION AND ALLOGRAFT;  Surgeon: Phylliss Bob, MD;  Location: [facility];  Service: Orthopedics;  Laterality: N/A;  ? COLONOSCOPY WITH ESOPHAGOGASTRODUODENOSCOPY (EGD)  05/04/2009  ? CORONARY ARTERY BYPASS GRAFT N/A 06/14/2019  ? Procedure: CORONARY ARTERY BYPASS GRAFTING (CABG) using LIMA to LAD; Endoscopic Right Greater Saphenous Vein Harvest: SVG to Circ (distal); SVG to RCA (distal).;  Surgeon:  [name], MD;  Location: [facility];  Service: Open Heart Surgery;  Laterality: N/A;  ? ENDOVEIN HARVEST OF GREATER SAPHENOUS VEIN Right 06/14/2019  ? Procedure: [place] Of Greater Saphenous Vein;  Surgeon: [name], MD;  Location: [facility];  Service: Open Heart Surgery;  Laterality: Right;  ? LEFT HEART CATH AND CORONARY ANGIOGRAPHY N/A 06/11/2019  ? Procedure: LEFT HEART CATH AND CORONARY ANGIOGRAPHY;  Surgeon: Wellington Hampshire, MD;  Location: [facility];  Service: Cardiovascular;  Laterality: N/A;  ? PIP JOINT FUSION Bilateral   ? pinky  ? TEE WITHOUT CARDIOVERSION N/A 06/14/2019  ? Procedure: TRANSESOPHAGEAL ECHOCARDIOGRAM (TEE);  Surgeon: [name], MD;  Location: [facility];  Service: Open Heart Surgery;  Laterality: N/A;  ? ? ?There were no vitals filed for this visit. ? ? Subjective Assessment - 05/11/21 1016   ? ? Subjective Pt notes increased tightness in bilat UTs. Noticing increased R UE N/T due to UT tightness. She reports she has one more day left of packing. Continued deltoid and pec tightness.   ? Pertinent History cervical fusion 2020, Lt rotator cuff tear   ? Patient Stated Goals reduce radicular symptoms   ? Currently in Pain? Yes   ? Pain Score 3    ? Pain Location Shoulder   ? Pain Orientation Left   ? Pain Onset More than a month ago   ? ?  ?  ? ?  ? ? ? ? ? [facility] PT Assessment - 05/11/21 0001   ? ?  ? Assessment  ? Medical Diagnosis Z98.890 (ICD-10-CM) - S/P left rotator cuff repair   ? Referring Provider (PT) Dr. Tania Ade   ? ?  ?  ? ?  ? ? ? ? ? ? ? ? ? ? ? ? ? ? ? ? [facility] Adult PT Treatment/Exercise - 05/11/21 0001   ? ?  ? Neck Exercises: Stretches  ? Upper Trapezius Stretch 20 seconds;3 reps   ? Other Neck Stretches levator stretch x30 sec   ?  ? Shoulder Exercises: Pulleys  ? Flexion 2 minutes   ? Scaption 2 minutes   ?  ? Moist Heat Therapy  ? Number Minutes Moist Heat 10 Minutes   ? Moist Heat Location Shoulder   ?  ? Manual Therapy  ? Manual therapy  comments skilled palpation to assess affects of dry needling and manual work   ? Soft tissue mobilization Lt upper quadrant through the pecs, biceps, anterior deltoid, bilat upper trap   ? Passive ROM PROM Lt shoulder flexion; abduction in scapular plane; ER in scapular plane   ? ?  ?  ? ?  ? ? ? Trigger Point Dry Needling - 05/11/21 0001   ? ? Consent Given? Yes   ? Education Handout Provided Previously provided   ? Upper Trapezius Response Palpable increased muscle length;Twitch reponse elicited   ? Levator Scapulae Response Palpable increased muscle length   ?  Cervical multifidi Response Palpable increased muscle length   ? Pectoralis Major Response Palpable increased muscle length   ? Pectoralis Minor Response Palpable increased muscle length   ? Deltoid Response Twitch response elicited;Palpable increased muscle length   ? Biceps Response Palpable increased muscle length   ? ?  ?  ? ?  ? ? ? ? ? ? ? ? ? ? PT Short Term Goals - 04/10/21 1301   ? ?  ? PT SHORT TERM GOAL #1  ? Title Pt will be independent with HEP   ? Time 6   ? Period Weeks   ? Status New   ? Target Date 05/22/21   ?  ? PT SHORT TERM GOAL #2  ? Title Pt will have full AROM in L shoulder for light ADLs such as feeding and doing her hair   ? Time 6   ? Period Weeks   ? Status New   ? Target Date 05/22/21   ?  ? PT SHORT TERM GOAL #3  ? Title Pt will be independent with maintaining postural stability for good scapulohumeral rhythm   ? Time 6   ? Period Weeks   ? Status New   ? Target Date 05/22/21   ? ?  ?  ? ?  ? ? ? ? PT Long Term Goals - 04/10/21 1304   ? ?  ? PT LONG TERM GOAL #1  ? Title The patient will be indep with HEP progression.   ? Time 12   ? Period Weeks   ? Status New   ? Target Date 07/03/21   ?  ? PT LONG TERM GOAL #2  ? Title Pt will have full pain free shoulder ROM to perform all of her normal tasks   ? Time 12   ? Period Weeks   ? Status New   ? Target Date 07/03/21   ?  ? PT LONG TERM GOAL #3  ? Title Pt will be able to lift and  carry at least 5# overhead to demo improved UE functional strength   ? Time 12   ? Period Weeks   ? Status New   ? Target Date 07/03/21   ?  ? PT LONG TERM GOAL #4  ? Title Pt will have improved FOTO score

## 2021-05-14 ENCOUNTER — Ambulatory Visit: Payer: BC Managed Care – PPO | Admitting: Physical Therapy

## 2021-05-14 DIAGNOSIS — M25612 Stiffness of left shoulder, not elsewhere classified: Secondary | ICD-10-CM | POA: Diagnosis not present

## 2021-05-14 DIAGNOSIS — M6281 Muscle weakness (generalized): Secondary | ICD-10-CM

## 2021-05-14 DIAGNOSIS — R600 Localized edema: Secondary | ICD-10-CM | POA: Diagnosis not present

## 2021-05-14 DIAGNOSIS — R293 Abnormal posture: Secondary | ICD-10-CM

## 2021-05-14 DIAGNOSIS — G8929 Other chronic pain: Secondary | ICD-10-CM | POA: Diagnosis not present

## 2021-05-14 DIAGNOSIS — M25512 Pain in left shoulder: Secondary | ICD-10-CM | POA: Diagnosis not present

## 2021-05-14 NOTE — Therapy (Signed)
[facility] ?[place] ?[place], [name], [address] ?Phone: [phone]   Fax:  [phone] ? ?Physical Therapy Treatment and Discharge ? ?Patient Details  ?Name: Tonya Chandler ?MRN: [id number] ?Date of Birth: 09-08-1960 ?Referring Provider (PT): Dr. Tania Ade ? ?PHYSICAL THERAPY DISCHARGE SUMMARY ? ?Visits from Start of Care: 11 ? ?Current functional level related to goals / functional outcomes: ?See below ?  ?Remaining deficits: ?See below ?  ?Education / Equipment: ?See below  ? ?Patient agrees to discharge. Patient goals were partially met. Patient is being discharged due to  moving to a new area. ? ? ?Encounter Date: 05/14/2021 ? ? PT End of Session - 05/14/21 0911   ? ? Visit Number 11   ? Number of Visits 24   ? Date for PT Re-Evaluation 07/03/21   ? Authorization Type [employer]   ? PT Start Time [phone]   ? PT Stop Time 0930   ? PT Time Calculation (min) 40 min   ? Activity Tolerance Patient tolerated treatment well   ? Behavior During Therapy [facility] for tasks assessed/performed   ? ?  ?  ? ?  ? ? ?Past Medical History:  ?Diagnosis Date  ? Abnormal ECG 04/01/2018  ? Abnormal stress echocardiogram 05/24/2019  ? Formatting of this note might be different from the original. Added automatically from request for surgery [id number]  ? Abnormal stress test 05/24/2019  ? Formatting of this note might be different from the original. Added automatically from request for surgery [id number]  ? Anemia   ? Angina pectoris ([place]) 06/04/2019  ? Anxiety   ? Atherosclerosis of native coronary artery with angina pectoris ([place]) 05/10/2015  ? Formatting of this note might be different from the original. 30% lesion-[place]  ? Bilateral carpal tunnel syndrome 05/23/2017  ? Bilateral knee pain 02/16/2010  ? Qualifier: Diagnosis of  By: Koleen Nimrod MD, Dellis Filbert    ? Carpal tunnel syndrome, right 12/16/2017  ? Colon polyps   ? Depression   ? DEPRESSION 02/16/2010  ? Qualifier: Diagnosis of  By: Koleen Nimrod MD,  Dellis Filbert    ? Depression, major, in partial remission ([place]) 07/14/2015  ? Diabetes mellitus   ? Diverticulosis   ? Dupuytren's contracture of right hand 06/24/2017  ? Essential hypertension 02/16/2010  ? Qualifier: Diagnosis of  By: Koleen Nimrod MD, Dellis Filbert    ? Gastro-esophageal reflux   ? Gastrointestinal ulcer   ? Generalized anxiety disorder 03/14/2015  ? GERD 02/16/2010  ? Qualifier: Diagnosis of  By: Koleen Nimrod MD, Dellis Filbert    ? H/O: GI bleed   ? Michela Pitcher it was due to taking metformin  ? Hyperlipidemia   ? Hyperlipidemia LDL goal <70 02/16/2010  ? Qualifier: Diagnosis of  By: Koleen Nimrod MD, Dellis Filbert    ? Hypertension   ? IBS (irritable bowel syndrome)   ? Impingement syndrome of shoulder, right 06/24/2017  ? Midline low back pain with left-sided sciatica 12/29/2013  ? Musculoskeletal chest pain 05/04/2015  ? Obesity   ? Post-nasal drainage 02/10/2020  ? Last Assessment & Plan:  Formatting of this note might be different from the original. Concern over postnasal drainage. Throat awareness symptoms ever since she was packed in the right nostril last summer.  Was treated with antibiotics with some improvement.  Causes intermittent hoarseness and loss of voice.  Non-smoker. EXAM by anterior rhinoscopy shows no obvious polyps or purulence or obstructi  ? Precordial pain 05/04/2015  ? Primary insomnia 03/14/2015  ? Radiculitis of right cervical region  01/20/2018  ? Radiculopathy 04/02/2018  ? Right hand pain 06/24/2017  ? RLS (restless legs syndrome) 07/14/2015  ? Type 2 diabetes mellitus with diabetic neuropathy, with long-term current use of insulin ([place]) 02/16/2010  ? Qualifier: Diagnosis of  By: Koleen Nimrod MD, Dellis Filbert    ? Unstable angina ([place]) 06/11/2019  ? ? ?Past Surgical History:  ?Procedure Laterality Date  ? ABDOMINAL HYSTERECTOMY    ? ANTERIOR CERVICAL DECOMP/DISCECTOMY FUSION N/A 04/02/2018  ? Procedure: ANTERIOR CERVICAL DECOMPRESSION FUSION, CERVICAL FOUR-FIVE, CERVICAL FIVE-SIX, WITH INSTRUMENTATION AND ALLOGRAFT;  Surgeon: Phylliss Bob, MD;  Location: [facility];  Service: Orthopedics;  Laterality: N/A;  ? COLONOSCOPY WITH ESOPHAGOGASTRODUODENOSCOPY (EGD)  05/04/2009  ? CORONARY ARTERY BYPASS GRAFT N/A 06/14/2019  ? Procedure: CORONARY ARTERY BYPASS GRAFTING (CABG) using LIMA to LAD; Endoscopic Right Greater Saphenous Vein Harvest: SVG to Circ (distal); SVG to RCA (distal).;  Surgeon: [name], MD;  Location: [facility];  Service: Open Heart Surgery;  Laterality: N/A;  ? ENDOVEIN HARVEST OF GREATER SAPHENOUS VEIN Right 06/14/2019  ? Procedure: [place] Of Greater Saphenous Vein;  Surgeon: [name], MD;  Location: [facility];  Service: Open Heart Surgery;  Laterality: Right;  ? LEFT HEART CATH AND CORONARY ANGIOGRAPHY N/A 06/11/2019  ? Procedure: LEFT HEART CATH AND CORONARY ANGIOGRAPHY;  Surgeon: Wellington Hampshire, MD;  Location: [facility];  Service: Cardiovascular;  Laterality: N/A;  ? PIP JOINT FUSION Bilateral   ? pinky  ? TEE WITHOUT CARDIOVERSION N/A 06/14/2019  ? Procedure: TRANSESOPHAGEAL ECHOCARDIOGRAM (TEE);  Surgeon: [name], MD;  Location: [facility];  Service: Open Heart Surgery;  Laterality: N/A;  ? ? ?There were no vitals filed for this visit. ? ? ? ? ? ? [facility] PT Assessment - 05/14/21 0001   ? ?  ? Assessment  ? Medical Diagnosis Z98.890 (ICD-10-CM) - S/P left rotator cuff repair   ? Referring Provider (PT) Dr. Tania Ade   ?  ? Precautions  ? Precaution Comments Week 6-8   ?  ? Home Environment  ? Living Environment Private residence   ? ?  ?  ? ?  ? ? ? ? ? ? ? ? ? ? ? ? ? ? ? ? [place] Adult PT Treatment/Exercise - 05/14/21 0001   ? ?  ? Shoulder Exercises: Sidelying  ? External Rotation Left;20 reps;AROM   ? Flexion AROM;20 reps;Left   ? Flexion Limitations scaption   ? ABduction AROM;Left;20 reps   ?  ? Shoulder Exercises: Standing  ? Flexion AAROM;10 reps   ? Flexion Limitations wall wash   ? ABduction AAROM;10 reps   ? ABduction Limitations wall wash   ?  ? Moist Heat Therapy  ? Number Minutes  Moist Heat 5 Minutes   ? Moist Heat Location Shoulder   ?  ? Cryotherapy  ? Number Minutes Cryotherapy 5 Minutes   ? Cryotherapy Location Shoulder   ?  ? Manual Therapy  ? Manual therapy comments skilled palpation to assess affects of dry needling and manual work   ? Soft tissue mobilization L bicep, deltoid, upper trap   ? Passive ROM PROM Lt shoulder flexion; abduction in scapular plane; ER in scapular plane   ? ?  ?  ? ?  ? ? ? Trigger Point Dry Needling - 05/14/21 0001   ? ? Upper Trapezius Response Palpable increased muscle length;Twitch reponse elicited   ? Deltoid Response Palpable increased muscle length   ? Biceps Response Palpable  increased muscle length   ? ?  ?  ? ?  ? ? ? ? ? ? ? ? PT Education - 05/14/21 1016   ? ? Education Details Discussed HEP progressions until she can find a new PT clinic after she moves. Discussed where she is in her shoulder protocol.   ? Person(s) Educated Patient   ? Methods Explanation;Demonstration;Tactile cues;Verbal cues;Handout   ? Comprehension Verbalized understanding;Returned demonstration;Verbal cues required;Tactile cues required   ? ?  ?  ? ?  ? ? ? PT Short Term Goals - 05/14/21 0935   ? ?  ? PT SHORT TERM GOAL #1  ? Title Pt will be independent with HEP   ? Time 6   ? Period Weeks   ? Status Achieved   ? Target Date 05/22/21   ?  ? PT SHORT TERM GOAL #2  ? Title Pt will have full AROM in L shoulder for light ADLs such as feeding and doing her hair   ? Baseline Full PROM and AROM in gravity eliminated positions. Against gravity, elevation is only ~100 deg   ? Time 6   ? Period Weeks   ? Status Achieved   ? Target Date 05/22/21   ?  ? PT SHORT TERM GOAL #3  ? Title Pt will be independent with maintaining postural stability for good scapulohumeral rhythm   ? Time 6   ? Period Weeks   ? Status Achieved   ? Target Date 05/22/21   ? ?  ?  ? ?  ? ? ? ? PT Long Term Goals - 05/14/21 0936   ? ?  ? PT LONG TERM GOAL #1  ? Title The patient will be indep with HEP  progression.   ? Time 12   ? Period Weeks   ? Status Partially Met   ? Target Date 07/03/21   ?  ? PT LONG TERM GOAL #2  ? Title Pt will have full pain free shoulder ROM to perform all of her normal tasks   ? Time

## 2021-05-21 ENCOUNTER — Other Ambulatory Visit: Payer: Self-pay | Admitting: Neurology

## 2021-05-21 NOTE — Progress Notes (Signed)
Received request for Mirtazapine from [facility] in [place], [name].  ?Pharmacy not listed on patient's chart. LVM for patient to call back to see if she needs medication and where to send.  ?

## 2021-05-25 MED ORDER — MIRTAZAPINE 45 MG PO TABS: 45.0000 mg | ORAL_TABLET | Freq: Every day | ORAL | 0 refills | Status: AC

## 2021-05-28 DIAGNOSIS — E119 Type 2 diabetes mellitus without complications: Secondary | ICD-10-CM | POA: Diagnosis not present

## 2021-05-28 LAB — HM DIABETES EYE EXAM

## 2021-05-31 DIAGNOSIS — H5213 Myopia, bilateral: Secondary | ICD-10-CM | POA: Diagnosis not present

## 2021-06-07 ENCOUNTER — Other Ambulatory Visit: Payer: Self-pay | Admitting: Physician Assistant

## 2021-06-07 MED ORDER — CELECOXIB 200 MG PO CAPS: 200.0000 mg | ORAL_CAPSULE | Freq: Two times a day (BID) | ORAL | 0 refills | Status: AC | PRN

## 2021-06-07 MED ORDER — GABAPENTIN 800 MG PO TABS: ORAL_TABLET | ORAL | 5 refills | Status: AC

## 2021-06-07 NOTE — Telephone Encounter (Signed)
Celebrex rx written by historical provider.

## 2021-06-08 ENCOUNTER — Encounter: Payer: Self-pay | Admitting: Cardiology

## 2021-06-08 ENCOUNTER — Other Ambulatory Visit: Payer: Self-pay

## 2021-06-11 ENCOUNTER — Other Ambulatory Visit: Payer: Self-pay

## 2021-06-11 MED ORDER — METOPROLOL TARTRATE 25 MG PO TABS: 25.0000 mg | ORAL_TABLET | Freq: Two times a day (BID) | ORAL | 3 refills | Status: AC

## 2021-06-13 DIAGNOSIS — Z9889 Other specified postprocedural states: Secondary | ICD-10-CM | POA: Diagnosis not present

## 2021-06-13 DIAGNOSIS — M25512 Pain in left shoulder: Secondary | ICD-10-CM | POA: Diagnosis not present

## 2021-07-03 DIAGNOSIS — M25612 Stiffness of left shoulder, not elsewhere classified: Secondary | ICD-10-CM | POA: Diagnosis not present

## 2021-07-03 DIAGNOSIS — M6281 Muscle weakness (generalized): Secondary | ICD-10-CM | POA: Diagnosis not present

## 2021-07-03 DIAGNOSIS — M25512 Pain in left shoulder: Secondary | ICD-10-CM | POA: Diagnosis not present

## 2021-07-10 DIAGNOSIS — M542 Cervicalgia: Secondary | ICD-10-CM | POA: Diagnosis not present

## 2021-07-10 DIAGNOSIS — M25512 Pain in left shoulder: Secondary | ICD-10-CM | POA: Diagnosis not present

## 2021-07-10 DIAGNOSIS — Z981 Arthrodesis status: Secondary | ICD-10-CM | POA: Diagnosis not present

## 2021-07-10 DIAGNOSIS — M5412 Radiculopathy, cervical region: Secondary | ICD-10-CM | POA: Diagnosis not present

## 2021-07-10 DIAGNOSIS — M6281 Muscle weakness (generalized): Secondary | ICD-10-CM | POA: Diagnosis not present

## 2021-07-10 DIAGNOSIS — M4312 Spondylolisthesis, cervical region: Secondary | ICD-10-CM | POA: Diagnosis not present

## 2021-07-10 DIAGNOSIS — M25612 Stiffness of left shoulder, not elsewhere classified: Secondary | ICD-10-CM | POA: Diagnosis not present

## 2021-07-10 DIAGNOSIS — M50323 Other cervical disc degeneration at C6-C7 level: Secondary | ICD-10-CM | POA: Diagnosis not present

## 2021-07-23 DIAGNOSIS — R292 Abnormal reflex: Secondary | ICD-10-CM | POA: Diagnosis not present

## 2021-07-23 DIAGNOSIS — R202 Paresthesia of skin: Secondary | ICD-10-CM | POA: Diagnosis not present

## 2021-07-23 DIAGNOSIS — M542 Cervicalgia: Secondary | ICD-10-CM | POA: Diagnosis not present

## 2021-08-06 ENCOUNTER — Ambulatory Visit: Payer: BC Managed Care – PPO | Admitting: Physician Assistant

## 2021-08-06 DIAGNOSIS — I10 Essential (primary) hypertension: Secondary | ICD-10-CM | POA: Diagnosis not present

## 2021-08-06 DIAGNOSIS — E1142 Type 2 diabetes mellitus with diabetic polyneuropathy: Secondary | ICD-10-CM | POA: Diagnosis not present

## 2021-08-06 DIAGNOSIS — E1140 Type 2 diabetes mellitus with diabetic neuropathy, unspecified: Secondary | ICD-10-CM

## 2021-08-06 DIAGNOSIS — Z1231 Encounter for screening mammogram for malignant neoplasm of breast: Secondary | ICD-10-CM | POA: Diagnosis not present

## 2021-08-06 DIAGNOSIS — F320 Major depressive disorder, single episode, mild: Secondary | ICD-10-CM | POA: Diagnosis not present

## 2021-08-06 DIAGNOSIS — F411 Generalized anxiety disorder: Secondary | ICD-10-CM | POA: Diagnosis not present

## 2021-08-06 DIAGNOSIS — Z794 Long term (current) use of insulin: Secondary | ICD-10-CM | POA: Diagnosis not present

## 2021-08-07 DIAGNOSIS — I2510 Atherosclerotic heart disease of native coronary artery without angina pectoris: Secondary | ICD-10-CM | POA: Diagnosis not present

## 2021-08-07 DIAGNOSIS — I10 Essential (primary) hypertension: Secondary | ICD-10-CM | POA: Diagnosis not present

## 2021-08-07 DIAGNOSIS — E1142 Type 2 diabetes mellitus with diabetic polyneuropathy: Secondary | ICD-10-CM | POA: Diagnosis not present

## 2021-08-07 DIAGNOSIS — R5383 Other fatigue: Secondary | ICD-10-CM | POA: Diagnosis not present

## 2021-08-07 DIAGNOSIS — Z794 Long term (current) use of insulin: Secondary | ICD-10-CM | POA: Diagnosis not present

## 2021-08-17 DIAGNOSIS — M47812 Spondylosis without myelopathy or radiculopathy, cervical region: Secondary | ICD-10-CM | POA: Diagnosis not present

## 2021-08-20 ENCOUNTER — Other Ambulatory Visit: Payer: Self-pay | Admitting: Neurology

## 2021-08-21 DIAGNOSIS — R202 Paresthesia of skin: Secondary | ICD-10-CM | POA: Diagnosis not present

## 2021-09-26 ENCOUNTER — Ambulatory Visit: Payer: BC Managed Care – PPO | Admitting: Cardiology

## 2021-10-06 DIAGNOSIS — M542 Cervicalgia: Secondary | ICD-10-CM | POA: Diagnosis not present

## 2021-10-06 DIAGNOSIS — M47812 Spondylosis without myelopathy or radiculopathy, cervical region: Secondary | ICD-10-CM | POA: Diagnosis not present

## 2021-10-06 DIAGNOSIS — M5033 Other cervical disc degeneration, cervicothoracic region: Secondary | ICD-10-CM | POA: Diagnosis not present

## 2021-10-09 DIAGNOSIS — R202 Paresthesia of skin: Secondary | ICD-10-CM | POA: Diagnosis not present

## 2021-10-17 DIAGNOSIS — R202 Paresthesia of skin: Secondary | ICD-10-CM | POA: Diagnosis not present

## 2021-10-17 DIAGNOSIS — R200 Anesthesia of skin: Secondary | ICD-10-CM | POA: Diagnosis not present

## 2021-10-23 DIAGNOSIS — R9431 Abnormal electrocardiogram [ECG] [EKG]: Secondary | ICD-10-CM | POA: Diagnosis not present

## 2021-10-23 DIAGNOSIS — E785 Hyperlipidemia, unspecified: Secondary | ICD-10-CM | POA: Diagnosis not present

## 2021-10-23 DIAGNOSIS — Z951 Presence of aortocoronary bypass graft: Secondary | ICD-10-CM | POA: Diagnosis not present

## 2021-10-23 DIAGNOSIS — I10 Essential (primary) hypertension: Secondary | ICD-10-CM | POA: Diagnosis not present

## 2021-11-01 DIAGNOSIS — F4381 Prolonged grief disorder: Secondary | ICD-10-CM | POA: Diagnosis not present

## 2021-11-01 DIAGNOSIS — F4321 Adjustment disorder with depressed mood: Secondary | ICD-10-CM | POA: Diagnosis not present

## 2021-11-02 DIAGNOSIS — R202 Paresthesia of skin: Secondary | ICD-10-CM | POA: Diagnosis not present

## 2021-11-02 DIAGNOSIS — I639 Cerebral infarction, unspecified: Secondary | ICD-10-CM | POA: Diagnosis not present

## 2021-11-02 DIAGNOSIS — R200 Anesthesia of skin: Secondary | ICD-10-CM | POA: Diagnosis not present

## 2021-11-08 DIAGNOSIS — F4321 Adjustment disorder with depressed mood: Secondary | ICD-10-CM | POA: Diagnosis not present

## 2021-11-08 DIAGNOSIS — F4381 Prolonged grief disorder: Secondary | ICD-10-CM | POA: Diagnosis not present

## 2021-11-13 DIAGNOSIS — R202 Paresthesia of skin: Secondary | ICD-10-CM | POA: Diagnosis not present

## 2021-11-15 DIAGNOSIS — F4381 Prolonged grief disorder: Secondary | ICD-10-CM | POA: Diagnosis not present

## 2021-11-15 DIAGNOSIS — F4321 Adjustment disorder with depressed mood: Secondary | ICD-10-CM | POA: Diagnosis not present

## 2021-11-19 DIAGNOSIS — R200 Anesthesia of skin: Secondary | ICD-10-CM | POA: Diagnosis not present

## 2021-11-19 DIAGNOSIS — I639 Cerebral infarction, unspecified: Secondary | ICD-10-CM | POA: Diagnosis not present

## 2021-11-19 DIAGNOSIS — R202 Paresthesia of skin: Secondary | ICD-10-CM | POA: Diagnosis not present

## 2021-11-21 ENCOUNTER — Other Ambulatory Visit: Payer: Self-pay | Admitting: Osteopathic Medicine

## 2021-11-22 DIAGNOSIS — F4381 Prolonged grief disorder: Secondary | ICD-10-CM | POA: Diagnosis not present

## 2021-11-22 DIAGNOSIS — F4321 Adjustment disorder with depressed mood: Secondary | ICD-10-CM | POA: Diagnosis not present

## 2021-11-28 DIAGNOSIS — F4381 Prolonged grief disorder: Secondary | ICD-10-CM | POA: Diagnosis not present

## 2021-11-28 DIAGNOSIS — F4321 Adjustment disorder with depressed mood: Secondary | ICD-10-CM | POA: Diagnosis not present

## 2021-12-03 DIAGNOSIS — M47812 Spondylosis without myelopathy or radiculopathy, cervical region: Secondary | ICD-10-CM | POA: Diagnosis not present

## 2021-12-03 DIAGNOSIS — M961 Postlaminectomy syndrome, not elsewhere classified: Secondary | ICD-10-CM | POA: Diagnosis not present

## 2021-12-05 DIAGNOSIS — F4381 Prolonged grief disorder: Secondary | ICD-10-CM | POA: Diagnosis not present

## 2021-12-05 DIAGNOSIS — F4321 Adjustment disorder with depressed mood: Secondary | ICD-10-CM | POA: Diagnosis not present

## 2021-12-06 DIAGNOSIS — Z23 Encounter for immunization: Secondary | ICD-10-CM | POA: Diagnosis not present

## 2021-12-06 DIAGNOSIS — E1142 Type 2 diabetes mellitus with diabetic polyneuropathy: Secondary | ICD-10-CM | POA: Diagnosis not present

## 2021-12-06 DIAGNOSIS — K219 Gastro-esophageal reflux disease without esophagitis: Secondary | ICD-10-CM | POA: Diagnosis not present

## 2021-12-06 DIAGNOSIS — F320 Major depressive disorder, single episode, mild: Secondary | ICD-10-CM | POA: Diagnosis not present

## 2021-12-06 DIAGNOSIS — I10 Essential (primary) hypertension: Secondary | ICD-10-CM | POA: Diagnosis not present

## 2021-12-06 DIAGNOSIS — F411 Generalized anxiety disorder: Secondary | ICD-10-CM | POA: Diagnosis not present

## 2021-12-06 DIAGNOSIS — Z794 Long term (current) use of insulin: Secondary | ICD-10-CM | POA: Diagnosis not present

## 2021-12-11 DIAGNOSIS — F4321 Adjustment disorder with depressed mood: Secondary | ICD-10-CM | POA: Diagnosis not present

## 2021-12-11 DIAGNOSIS — M47812 Spondylosis without myelopathy or radiculopathy, cervical region: Secondary | ICD-10-CM | POA: Diagnosis not present

## 2021-12-11 DIAGNOSIS — F4381 Prolonged grief disorder: Secondary | ICD-10-CM | POA: Diagnosis not present

## 2021-12-21 DIAGNOSIS — J22 Unspecified acute lower respiratory infection: Secondary | ICD-10-CM | POA: Diagnosis not present

## 2021-12-21 DIAGNOSIS — J019 Acute sinusitis, unspecified: Secondary | ICD-10-CM | POA: Diagnosis not present

## 2021-12-21 DIAGNOSIS — B9689 Other specified bacterial agents as the cause of diseases classified elsewhere: Secondary | ICD-10-CM | POA: Diagnosis not present

## 2021-12-21 DIAGNOSIS — M47812 Spondylosis without myelopathy or radiculopathy, cervical region: Secondary | ICD-10-CM | POA: Diagnosis not present

## 2021-12-21 DIAGNOSIS — R059 Cough, unspecified: Secondary | ICD-10-CM | POA: Diagnosis not present

## 2021-12-21 DIAGNOSIS — M4722 Other spondylosis with radiculopathy, cervical region: Secondary | ICD-10-CM | POA: Diagnosis not present

## 2021-12-21 DIAGNOSIS — R079 Chest pain, unspecified: Secondary | ICD-10-CM | POA: Diagnosis not present

## 2021-12-24 DIAGNOSIS — F4321 Adjustment disorder with depressed mood: Secondary | ICD-10-CM | POA: Diagnosis not present

## 2021-12-24 DIAGNOSIS — F4381 Prolonged grief disorder: Secondary | ICD-10-CM | POA: Diagnosis not present

## 2021-12-25 DIAGNOSIS — M47812 Spondylosis without myelopathy or radiculopathy, cervical region: Secondary | ICD-10-CM | POA: Diagnosis not present

## 2021-12-25 DIAGNOSIS — M47813 Spondylosis without myelopathy or radiculopathy, cervicothoracic region: Secondary | ICD-10-CM | POA: Diagnosis not present

## 2021-12-25 DIAGNOSIS — M4312 Spondylolisthesis, cervical region: Secondary | ICD-10-CM | POA: Diagnosis not present

## 2021-12-28 DIAGNOSIS — R6889 Other general symptoms and signs: Secondary | ICD-10-CM | POA: Diagnosis not present

## 2021-12-28 DIAGNOSIS — M542 Cervicalgia: Secondary | ICD-10-CM | POA: Diagnosis not present

## 2021-12-28 DIAGNOSIS — M47812 Spondylosis without myelopathy or radiculopathy, cervical region: Secondary | ICD-10-CM | POA: Diagnosis not present

## 2021-12-28 DIAGNOSIS — M4722 Other spondylosis with radiculopathy, cervical region: Secondary | ICD-10-CM | POA: Diagnosis not present

## 2021-12-28 DIAGNOSIS — Z1231 Encounter for screening mammogram for malignant neoplasm of breast: Secondary | ICD-10-CM | POA: Diagnosis not present

## 2022-01-01 DIAGNOSIS — F4321 Adjustment disorder with depressed mood: Secondary | ICD-10-CM | POA: Diagnosis not present

## 2022-01-01 DIAGNOSIS — F4381 Prolonged grief disorder: Secondary | ICD-10-CM | POA: Diagnosis not present

## 2022-01-18 DIAGNOSIS — F4321 Adjustment disorder with depressed mood: Secondary | ICD-10-CM | POA: Diagnosis not present

## 2022-01-18 DIAGNOSIS — F4381 Prolonged grief disorder: Secondary | ICD-10-CM | POA: Diagnosis not present

## 2022-01-23 DIAGNOSIS — M47812 Spondylosis without myelopathy or radiculopathy, cervical region: Secondary | ICD-10-CM | POA: Diagnosis not present

## 2022-01-25 DIAGNOSIS — F4321 Adjustment disorder with depressed mood: Secondary | ICD-10-CM | POA: Diagnosis not present

## 2022-01-25 DIAGNOSIS — F4381 Prolonged grief disorder: Secondary | ICD-10-CM | POA: Diagnosis not present

## 2022-01-30 DIAGNOSIS — M961 Postlaminectomy syndrome, not elsewhere classified: Secondary | ICD-10-CM | POA: Diagnosis not present

## 2022-01-30 DIAGNOSIS — M47812 Spondylosis without myelopathy or radiculopathy, cervical region: Secondary | ICD-10-CM | POA: Diagnosis not present

## 2022-01-30 DIAGNOSIS — M4722 Other spondylosis with radiculopathy, cervical region: Secondary | ICD-10-CM | POA: Diagnosis not present

## 2022-02-01 DIAGNOSIS — F4321 Adjustment disorder with depressed mood: Secondary | ICD-10-CM | POA: Diagnosis not present

## 2022-02-01 DIAGNOSIS — F4381 Prolonged grief disorder: Secondary | ICD-10-CM | POA: Diagnosis not present

## 2022-02-04 ENCOUNTER — Other Ambulatory Visit: Payer: Self-pay | Admitting: Physician Assistant

## 2022-02-04 DIAGNOSIS — E1140 Type 2 diabetes mellitus with diabetic neuropathy, unspecified: Secondary | ICD-10-CM

## 2022-02-12 DIAGNOSIS — F4381 Prolonged grief disorder: Secondary | ICD-10-CM | POA: Diagnosis not present

## 2022-02-12 DIAGNOSIS — F4321 Adjustment disorder with depressed mood: Secondary | ICD-10-CM | POA: Diagnosis not present

## 2022-02-18 DIAGNOSIS — F4381 Prolonged grief disorder: Secondary | ICD-10-CM | POA: Diagnosis not present

## 2022-02-18 DIAGNOSIS — F4321 Adjustment disorder with depressed mood: Secondary | ICD-10-CM | POA: Diagnosis not present

## 2022-02-26 DIAGNOSIS — F4381 Prolonged grief disorder: Secondary | ICD-10-CM | POA: Diagnosis not present

## 2022-02-26 DIAGNOSIS — F4321 Adjustment disorder with depressed mood: Secondary | ICD-10-CM | POA: Diagnosis not present

## 2022-03-04 DIAGNOSIS — M4722 Other spondylosis with radiculopathy, cervical region: Secondary | ICD-10-CM | POA: Diagnosis not present

## 2022-03-04 DIAGNOSIS — M961 Postlaminectomy syndrome, not elsewhere classified: Secondary | ICD-10-CM | POA: Diagnosis not present

## 2022-03-04 DIAGNOSIS — M47812 Spondylosis without myelopathy or radiculopathy, cervical region: Secondary | ICD-10-CM | POA: Diagnosis not present

## 2022-03-07 DIAGNOSIS — H2513 Age-related nuclear cataract, bilateral: Secondary | ICD-10-CM | POA: Diagnosis not present

## 2022-03-07 DIAGNOSIS — H524 Presbyopia: Secondary | ICD-10-CM | POA: Diagnosis not present

## 2022-03-07 DIAGNOSIS — E113299 Type 2 diabetes mellitus with mild nonproliferative diabetic retinopathy without macular edema, unspecified eye: Secondary | ICD-10-CM | POA: Diagnosis not present

## 2022-03-08 DIAGNOSIS — F4321 Adjustment disorder with depressed mood: Secondary | ICD-10-CM | POA: Diagnosis not present

## 2022-03-08 DIAGNOSIS — F4381 Prolonged grief disorder: Secondary | ICD-10-CM | POA: Diagnosis not present

## 2022-03-11 ENCOUNTER — Other Ambulatory Visit: Payer: Self-pay | Admitting: Physician Assistant

## 2022-03-11 DIAGNOSIS — Z794 Long term (current) use of insulin: Secondary | ICD-10-CM

## 2022-03-12 DIAGNOSIS — M47812 Spondylosis without myelopathy or radiculopathy, cervical region: Secondary | ICD-10-CM | POA: Diagnosis not present

## 2022-03-14 DIAGNOSIS — F4381 Prolonged grief disorder: Secondary | ICD-10-CM | POA: Diagnosis not present

## 2022-03-14 DIAGNOSIS — F4321 Adjustment disorder with depressed mood: Secondary | ICD-10-CM | POA: Diagnosis not present

## 2022-03-19 DIAGNOSIS — D23111 Other benign neoplasm of skin of right upper eyelid, including canthus: Secondary | ICD-10-CM | POA: Diagnosis not present

## 2022-03-19 DIAGNOSIS — D210 Benign neoplasm of connective and other soft tissue of head, face and neck: Secondary | ICD-10-CM | POA: Diagnosis not present

## 2022-03-21 DIAGNOSIS — F4381 Prolonged grief disorder: Secondary | ICD-10-CM | POA: Diagnosis not present

## 2022-03-21 DIAGNOSIS — F4321 Adjustment disorder with depressed mood: Secondary | ICD-10-CM | POA: Diagnosis not present

## 2022-03-28 DIAGNOSIS — F4321 Adjustment disorder with depressed mood: Secondary | ICD-10-CM | POA: Diagnosis not present

## 2022-03-28 DIAGNOSIS — F4381 Prolonged grief disorder: Secondary | ICD-10-CM | POA: Diagnosis not present

## 2022-04-04 DIAGNOSIS — F4321 Adjustment disorder with depressed mood: Secondary | ICD-10-CM | POA: Diagnosis not present

## 2022-04-04 DIAGNOSIS — F4381 Prolonged grief disorder: Secondary | ICD-10-CM | POA: Diagnosis not present

## 2022-04-11 DIAGNOSIS — M79641 Pain in right hand: Secondary | ICD-10-CM | POA: Diagnosis not present

## 2022-04-11 DIAGNOSIS — E669 Obesity, unspecified: Secondary | ICD-10-CM | POA: Diagnosis not present

## 2022-04-11 DIAGNOSIS — Z794 Long term (current) use of insulin: Secondary | ICD-10-CM | POA: Diagnosis not present

## 2022-04-11 DIAGNOSIS — M19041 Primary osteoarthritis, right hand: Secondary | ICD-10-CM | POA: Diagnosis not present

## 2022-04-11 DIAGNOSIS — M7989 Other specified soft tissue disorders: Secondary | ICD-10-CM | POA: Diagnosis not present

## 2022-04-11 DIAGNOSIS — E1142 Type 2 diabetes mellitus with diabetic polyneuropathy: Secondary | ICD-10-CM | POA: Diagnosis not present

## 2022-04-11 DIAGNOSIS — M79642 Pain in left hand: Secondary | ICD-10-CM | POA: Diagnosis not present

## 2022-04-11 DIAGNOSIS — M19042 Primary osteoarthritis, left hand: Secondary | ICD-10-CM | POA: Diagnosis not present

## 2022-04-11 DIAGNOSIS — I10 Essential (primary) hypertension: Secondary | ICD-10-CM | POA: Diagnosis not present

## 2022-04-11 DIAGNOSIS — F324 Major depressive disorder, single episode, in partial remission: Secondary | ICD-10-CM | POA: Diagnosis not present

## 2022-04-12 DIAGNOSIS — F4321 Adjustment disorder with depressed mood: Secondary | ICD-10-CM | POA: Diagnosis not present

## 2022-04-12 DIAGNOSIS — F4381 Prolonged grief disorder: Secondary | ICD-10-CM | POA: Diagnosis not present

## 2022-04-15 DIAGNOSIS — M19042 Primary osteoarthritis, left hand: Secondary | ICD-10-CM | POA: Diagnosis not present

## 2022-04-15 DIAGNOSIS — M19041 Primary osteoarthritis, right hand: Secondary | ICD-10-CM | POA: Diagnosis not present

## 2022-04-18 DIAGNOSIS — F4321 Adjustment disorder with depressed mood: Secondary | ICD-10-CM | POA: Diagnosis not present

## 2022-04-18 DIAGNOSIS — F4381 Prolonged grief disorder: Secondary | ICD-10-CM | POA: Diagnosis not present

## 2022-04-26 DIAGNOSIS — F4381 Prolonged grief disorder: Secondary | ICD-10-CM | POA: Diagnosis not present

## 2022-04-26 DIAGNOSIS — F4321 Adjustment disorder with depressed mood: Secondary | ICD-10-CM | POA: Diagnosis not present

## 2022-05-02 DIAGNOSIS — F4381 Prolonged grief disorder: Secondary | ICD-10-CM | POA: Diagnosis not present

## 2022-05-02 DIAGNOSIS — F4321 Adjustment disorder with depressed mood: Secondary | ICD-10-CM | POA: Diagnosis not present

## 2022-05-09 DIAGNOSIS — M154 Erosive (osteo)arthritis: Secondary | ICD-10-CM | POA: Diagnosis not present

## 2022-05-09 DIAGNOSIS — E119 Type 2 diabetes mellitus without complications: Secondary | ICD-10-CM | POA: Diagnosis not present

## 2022-05-09 DIAGNOSIS — F4381 Prolonged grief disorder: Secondary | ICD-10-CM | POA: Diagnosis not present

## 2022-05-09 DIAGNOSIS — Z794 Long term (current) use of insulin: Secondary | ICD-10-CM | POA: Diagnosis not present

## 2022-05-09 DIAGNOSIS — F4321 Adjustment disorder with depressed mood: Secondary | ICD-10-CM | POA: Diagnosis not present

## 2022-05-09 DIAGNOSIS — D84821 Immunodeficiency due to drugs: Secondary | ICD-10-CM | POA: Diagnosis not present

## 2022-05-17 DIAGNOSIS — F4321 Adjustment disorder with depressed mood: Secondary | ICD-10-CM | POA: Diagnosis not present

## 2022-05-17 DIAGNOSIS — F4381 Prolonged grief disorder: Secondary | ICD-10-CM | POA: Diagnosis not present

## 2022-05-23 DIAGNOSIS — F4381 Prolonged grief disorder: Secondary | ICD-10-CM | POA: Diagnosis not present

## 2022-05-23 DIAGNOSIS — F4321 Adjustment disorder with depressed mood: Secondary | ICD-10-CM | POA: Diagnosis not present

## 2022-05-29 DIAGNOSIS — X32XXXA Exposure to sunlight, initial encounter: Secondary | ICD-10-CM | POA: Diagnosis not present

## 2022-05-29 DIAGNOSIS — L298 Other pruritus: Secondary | ICD-10-CM | POA: Diagnosis not present

## 2022-05-29 DIAGNOSIS — D2371 Other benign neoplasm of skin of right lower limb, including hip: Secondary | ICD-10-CM | POA: Diagnosis not present

## 2022-05-29 DIAGNOSIS — L570 Actinic keratosis: Secondary | ICD-10-CM | POA: Diagnosis not present

## 2022-05-29 DIAGNOSIS — L578 Other skin changes due to chronic exposure to nonionizing radiation: Secondary | ICD-10-CM | POA: Diagnosis not present

## 2022-05-29 DIAGNOSIS — L820 Inflamed seborrheic keratosis: Secondary | ICD-10-CM | POA: Diagnosis not present

## 2022-05-29 DIAGNOSIS — D2372 Other benign neoplasm of skin of left lower limb, including hip: Secondary | ICD-10-CM | POA: Diagnosis not present

## 2022-05-29 DIAGNOSIS — R208 Other disturbances of skin sensation: Secondary | ICD-10-CM | POA: Diagnosis not present

## 2022-05-29 DIAGNOSIS — L538 Other specified erythematous conditions: Secondary | ICD-10-CM | POA: Diagnosis not present

## 2022-05-30 DIAGNOSIS — F4321 Adjustment disorder with depressed mood: Secondary | ICD-10-CM | POA: Diagnosis not present

## 2022-05-30 DIAGNOSIS — F4381 Prolonged grief disorder: Secondary | ICD-10-CM | POA: Diagnosis not present

## 2022-06-13 DIAGNOSIS — F4381 Prolonged grief disorder: Secondary | ICD-10-CM | POA: Diagnosis not present

## 2022-06-13 DIAGNOSIS — F4321 Adjustment disorder with depressed mood: Secondary | ICD-10-CM | POA: Diagnosis not present

## 2022-06-20 DIAGNOSIS — F4381 Prolonged grief disorder: Secondary | ICD-10-CM | POA: Diagnosis not present

## 2022-06-20 DIAGNOSIS — F4321 Adjustment disorder with depressed mood: Secondary | ICD-10-CM | POA: Diagnosis not present

## 2022-06-27 DIAGNOSIS — F4321 Adjustment disorder with depressed mood: Secondary | ICD-10-CM | POA: Diagnosis not present

## 2022-06-27 DIAGNOSIS — F4381 Prolonged grief disorder: Secondary | ICD-10-CM | POA: Diagnosis not present

## 2022-07-01 DIAGNOSIS — M154 Erosive (osteo)arthritis: Secondary | ICD-10-CM | POA: Diagnosis not present

## 2022-07-08 DIAGNOSIS — D84821 Immunodeficiency due to drugs: Secondary | ICD-10-CM | POA: Diagnosis not present

## 2022-07-08 DIAGNOSIS — Z6832 Body mass index (BMI) 32.0-32.9, adult: Secondary | ICD-10-CM | POA: Diagnosis not present

## 2022-07-08 DIAGNOSIS — M154 Erosive (osteo)arthritis: Secondary | ICD-10-CM | POA: Diagnosis not present

## 2022-07-11 DIAGNOSIS — F4321 Adjustment disorder with depressed mood: Secondary | ICD-10-CM | POA: Diagnosis not present

## 2022-07-11 DIAGNOSIS — F4381 Prolonged grief disorder: Secondary | ICD-10-CM | POA: Diagnosis not present

## 2022-07-22 DIAGNOSIS — Z1330 Encounter for screening examination for mental health and behavioral disorders, unspecified: Secondary | ICD-10-CM | POA: Diagnosis not present

## 2022-07-22 DIAGNOSIS — I10 Essential (primary) hypertension: Secondary | ICD-10-CM | POA: Diagnosis not present

## 2022-07-22 DIAGNOSIS — M546 Pain in thoracic spine: Secondary | ICD-10-CM | POA: Diagnosis not present

## 2022-07-22 DIAGNOSIS — M1711 Unilateral primary osteoarthritis, right knee: Secondary | ICD-10-CM | POA: Diagnosis not present

## 2022-07-22 DIAGNOSIS — M25561 Pain in right knee: Secondary | ICD-10-CM | POA: Diagnosis not present

## 2022-07-26 DIAGNOSIS — F4381 Prolonged grief disorder: Secondary | ICD-10-CM | POA: Diagnosis not present

## 2022-07-26 DIAGNOSIS — F4321 Adjustment disorder with depressed mood: Secondary | ICD-10-CM | POA: Diagnosis not present

## 2022-07-29 DIAGNOSIS — F4321 Adjustment disorder with depressed mood: Secondary | ICD-10-CM | POA: Diagnosis not present

## 2022-07-29 DIAGNOSIS — F4381 Prolonged grief disorder: Secondary | ICD-10-CM | POA: Diagnosis not present

## 2022-08-06 DIAGNOSIS — I10 Essential (primary) hypertension: Secondary | ICD-10-CM | POA: Diagnosis not present

## 2022-08-06 DIAGNOSIS — E1142 Type 2 diabetes mellitus with diabetic polyneuropathy: Secondary | ICD-10-CM | POA: Diagnosis not present

## 2022-08-06 DIAGNOSIS — R197 Diarrhea, unspecified: Secondary | ICD-10-CM | POA: Diagnosis not present

## 2022-08-06 DIAGNOSIS — Z794 Long term (current) use of insulin: Secondary | ICD-10-CM | POA: Diagnosis not present

## 2022-08-06 DIAGNOSIS — F419 Anxiety disorder, unspecified: Secondary | ICD-10-CM | POA: Diagnosis not present

## 2022-08-06 DIAGNOSIS — K760 Fatty (change of) liver, not elsewhere classified: Secondary | ICD-10-CM | POA: Diagnosis not present

## 2022-08-06 DIAGNOSIS — D131 Benign neoplasm of stomach: Secondary | ICD-10-CM | POA: Diagnosis not present

## 2022-08-08 DIAGNOSIS — F4381 Prolonged grief disorder: Secondary | ICD-10-CM | POA: Diagnosis not present

## 2022-08-08 DIAGNOSIS — F4321 Adjustment disorder with depressed mood: Secondary | ICD-10-CM | POA: Diagnosis not present

## 2022-08-21 DIAGNOSIS — M25561 Pain in right knee: Secondary | ICD-10-CM | POA: Diagnosis not present

## 2022-08-22 DIAGNOSIS — Z794 Long term (current) use of insulin: Secondary | ICD-10-CM | POA: Diagnosis not present

## 2022-08-22 DIAGNOSIS — E669 Obesity, unspecified: Secondary | ICD-10-CM | POA: Diagnosis not present

## 2022-08-22 DIAGNOSIS — F4381 Prolonged grief disorder: Secondary | ICD-10-CM | POA: Diagnosis not present

## 2022-08-22 DIAGNOSIS — F4321 Adjustment disorder with depressed mood: Secondary | ICD-10-CM | POA: Diagnosis not present

## 2022-08-22 DIAGNOSIS — I10 Essential (primary) hypertension: Secondary | ICD-10-CM | POA: Diagnosis not present

## 2022-08-22 DIAGNOSIS — E1142 Type 2 diabetes mellitus with diabetic polyneuropathy: Secondary | ICD-10-CM | POA: Diagnosis not present

## 2022-08-22 DIAGNOSIS — K219 Gastro-esophageal reflux disease without esophagitis: Secondary | ICD-10-CM | POA: Diagnosis not present

## 2022-08-23 DIAGNOSIS — I10 Essential (primary) hypertension: Secondary | ICD-10-CM | POA: Diagnosis not present

## 2022-08-23 DIAGNOSIS — R5383 Other fatigue: Secondary | ICD-10-CM | POA: Diagnosis not present

## 2022-08-23 DIAGNOSIS — Z794 Long term (current) use of insulin: Secondary | ICD-10-CM | POA: Diagnosis not present

## 2022-08-23 DIAGNOSIS — E1142 Type 2 diabetes mellitus with diabetic polyneuropathy: Secondary | ICD-10-CM | POA: Diagnosis not present

## 2022-08-30 DIAGNOSIS — F4381 Prolonged grief disorder: Secondary | ICD-10-CM | POA: Diagnosis not present

## 2022-08-30 DIAGNOSIS — F4321 Adjustment disorder with depressed mood: Secondary | ICD-10-CM | POA: Diagnosis not present

## 2022-09-06 DIAGNOSIS — Z13820 Encounter for screening for osteoporosis: Secondary | ICD-10-CM | POA: Diagnosis not present

## 2022-09-06 DIAGNOSIS — Z780 Asymptomatic menopausal state: Secondary | ICD-10-CM | POA: Diagnosis not present

## 2022-09-06 DIAGNOSIS — M154 Erosive (osteo)arthritis: Secondary | ICD-10-CM | POA: Diagnosis not present

## 2022-09-06 DIAGNOSIS — F4321 Adjustment disorder with depressed mood: Secondary | ICD-10-CM | POA: Diagnosis not present

## 2022-09-06 DIAGNOSIS — F4381 Prolonged grief disorder: Secondary | ICD-10-CM | POA: Diagnosis not present

## 2022-09-06 DIAGNOSIS — D84821 Immunodeficiency due to drugs: Secondary | ICD-10-CM | POA: Diagnosis not present

## 2022-09-11 DIAGNOSIS — Z13820 Encounter for screening for osteoporosis: Secondary | ICD-10-CM | POA: Diagnosis not present

## 2022-09-11 DIAGNOSIS — Z780 Asymptomatic menopausal state: Secondary | ICD-10-CM | POA: Diagnosis not present

## 2022-09-13 DIAGNOSIS — F4321 Adjustment disorder with depressed mood: Secondary | ICD-10-CM | POA: Diagnosis not present

## 2022-09-13 DIAGNOSIS — F4381 Prolonged grief disorder: Secondary | ICD-10-CM | POA: Diagnosis not present

## 2022-09-17 DIAGNOSIS — I10 Essential (primary) hypertension: Secondary | ICD-10-CM | POA: Diagnosis not present

## 2022-09-17 DIAGNOSIS — R072 Precordial pain: Secondary | ICD-10-CM | POA: Diagnosis not present

## 2022-09-17 DIAGNOSIS — R9431 Abnormal electrocardiogram [ECG] [EKG]: Secondary | ICD-10-CM | POA: Diagnosis not present

## 2022-09-17 DIAGNOSIS — I25112 Atherosclerotic heart disease of native coronary artery with refractory angina pectoris: Secondary | ICD-10-CM | POA: Diagnosis not present

## 2022-09-19 DIAGNOSIS — S83511A Sprain of anterior cruciate ligament of right knee, initial encounter: Secondary | ICD-10-CM | POA: Diagnosis not present

## 2022-09-19 DIAGNOSIS — M25561 Pain in right knee: Secondary | ICD-10-CM | POA: Diagnosis not present

## 2022-09-19 DIAGNOSIS — S83241A Other tear of medial meniscus, current injury, right knee, initial encounter: Secondary | ICD-10-CM | POA: Diagnosis not present

## 2022-09-20 DIAGNOSIS — F4381 Prolonged grief disorder: Secondary | ICD-10-CM | POA: Diagnosis not present

## 2022-09-20 DIAGNOSIS — F4321 Adjustment disorder with depressed mood: Secondary | ICD-10-CM | POA: Diagnosis not present

## 2022-09-24 DIAGNOSIS — M2391 Unspecified internal derangement of right knee: Secondary | ICD-10-CM | POA: Diagnosis not present

## 2022-09-24 DIAGNOSIS — M25561 Pain in right knee: Secondary | ICD-10-CM | POA: Diagnosis not present

## 2022-09-30 DIAGNOSIS — I10 Essential (primary) hypertension: Secondary | ICD-10-CM | POA: Diagnosis not present

## 2022-09-30 DIAGNOSIS — J0140 Acute pansinusitis, unspecified: Secondary | ICD-10-CM | POA: Diagnosis not present

## 2022-10-04 DIAGNOSIS — F4381 Prolonged grief disorder: Secondary | ICD-10-CM | POA: Diagnosis not present

## 2022-10-04 DIAGNOSIS — F4321 Adjustment disorder with depressed mood: Secondary | ICD-10-CM | POA: Diagnosis not present

## 2022-10-07 DIAGNOSIS — I25110 Atherosclerotic heart disease of native coronary artery with unstable angina pectoris: Secondary | ICD-10-CM | POA: Diagnosis not present

## 2022-10-07 DIAGNOSIS — Z7985 Long-term (current) use of injectable non-insulin antidiabetic drugs: Secondary | ICD-10-CM | POA: Diagnosis not present

## 2022-10-07 DIAGNOSIS — T82858A Stenosis of vascular prosthetic devices, implants and grafts, initial encounter: Secondary | ICD-10-CM | POA: Diagnosis not present

## 2022-10-07 DIAGNOSIS — Y840 Cardiac catheterization as the cause of abnormal reaction of the patient, or of later complication, without mention of misadventure at the time of the procedure: Secondary | ICD-10-CM | POA: Diagnosis not present

## 2022-10-07 DIAGNOSIS — R0989 Other specified symptoms and signs involving the circulatory and respiratory systems: Secondary | ICD-10-CM | POA: Diagnosis not present

## 2022-10-07 DIAGNOSIS — I97630 Postprocedural hematoma of a circulatory system organ or structure following a cardiac catheterization: Secondary | ICD-10-CM | POA: Diagnosis not present

## 2022-10-07 DIAGNOSIS — E1169 Type 2 diabetes mellitus with other specified complication: Secondary | ICD-10-CM | POA: Diagnosis not present

## 2022-10-07 DIAGNOSIS — E1140 Type 2 diabetes mellitus with diabetic neuropathy, unspecified: Secondary | ICD-10-CM | POA: Diagnosis not present

## 2022-10-07 DIAGNOSIS — I10 Essential (primary) hypertension: Secondary | ICD-10-CM | POA: Diagnosis not present

## 2022-10-07 DIAGNOSIS — E785 Hyperlipidemia, unspecified: Secondary | ICD-10-CM | POA: Diagnosis not present

## 2022-10-07 DIAGNOSIS — Z794 Long term (current) use of insulin: Secondary | ICD-10-CM | POA: Diagnosis not present

## 2022-10-07 DIAGNOSIS — Z90710 Acquired absence of both cervix and uterus: Secondary | ICD-10-CM | POA: Diagnosis not present

## 2022-10-07 DIAGNOSIS — Z86010 Personal history of colonic polyps: Secondary | ICD-10-CM | POA: Diagnosis not present

## 2022-10-07 DIAGNOSIS — Z7902 Long term (current) use of antithrombotics/antiplatelets: Secondary | ICD-10-CM | POA: Diagnosis not present

## 2022-10-07 DIAGNOSIS — R011 Cardiac murmur, unspecified: Secondary | ICD-10-CM | POA: Diagnosis not present

## 2022-10-07 DIAGNOSIS — I2582 Chronic total occlusion of coronary artery: Secondary | ICD-10-CM | POA: Diagnosis not present

## 2022-10-07 DIAGNOSIS — R079 Chest pain, unspecified: Secondary | ICD-10-CM | POA: Diagnosis not present

## 2022-10-07 DIAGNOSIS — I252 Old myocardial infarction: Secondary | ICD-10-CM | POA: Diagnosis not present

## 2022-10-07 DIAGNOSIS — I25710 Atherosclerosis of autologous vein coronary artery bypass graft(s) with unstable angina pectoris: Secondary | ICD-10-CM | POA: Diagnosis not present

## 2022-10-14 DIAGNOSIS — R011 Cardiac murmur, unspecified: Secondary | ICD-10-CM | POA: Diagnosis not present

## 2022-10-14 DIAGNOSIS — R0989 Other specified symptoms and signs involving the circulatory and respiratory systems: Secondary | ICD-10-CM | POA: Diagnosis not present

## 2022-10-14 DIAGNOSIS — I7090 Unspecified atherosclerosis: Secondary | ICD-10-CM | POA: Diagnosis not present

## 2022-10-15 DIAGNOSIS — M545 Low back pain, unspecified: Secondary | ICD-10-CM | POA: Diagnosis not present

## 2022-10-15 DIAGNOSIS — I10 Essential (primary) hypertension: Secondary | ICD-10-CM | POA: Diagnosis not present

## 2022-10-15 DIAGNOSIS — I97630 Postprocedural hematoma of a circulatory system organ or structure following a cardiac catheterization: Secondary | ICD-10-CM | POA: Diagnosis not present

## 2022-10-15 DIAGNOSIS — M25511 Pain in right shoulder: Secondary | ICD-10-CM | POA: Diagnosis not present

## 2022-10-16 DIAGNOSIS — R935 Abnormal findings on diagnostic imaging of other abdominal regions, including retroperitoneum: Secondary | ICD-10-CM | POA: Diagnosis not present

## 2022-10-16 DIAGNOSIS — I10 Essential (primary) hypertension: Secondary | ICD-10-CM | POA: Diagnosis not present

## 2022-10-16 DIAGNOSIS — K760 Fatty (change of) liver, not elsewhere classified: Secondary | ICD-10-CM | POA: Diagnosis not present

## 2022-10-17 DIAGNOSIS — I10 Essential (primary) hypertension: Secondary | ICD-10-CM | POA: Diagnosis not present

## 2022-10-17 DIAGNOSIS — I97630 Postprocedural hematoma of a circulatory system organ or structure following a cardiac catheterization: Secondary | ICD-10-CM | POA: Diagnosis not present

## 2022-10-17 DIAGNOSIS — Z23 Encounter for immunization: Secondary | ICD-10-CM | POA: Diagnosis not present

## 2022-10-18 DIAGNOSIS — F4321 Adjustment disorder with depressed mood: Secondary | ICD-10-CM | POA: Diagnosis not present

## 2022-10-18 DIAGNOSIS — F4381 Prolonged grief disorder: Secondary | ICD-10-CM | POA: Diagnosis not present

## 2022-10-22 DIAGNOSIS — I25112 Atherosclerotic heart disease of native coronary artery with refractory angina pectoris: Secondary | ICD-10-CM | POA: Diagnosis not present

## 2022-10-22 DIAGNOSIS — E1159 Type 2 diabetes mellitus with other circulatory complications: Secondary | ICD-10-CM | POA: Diagnosis not present

## 2022-10-22 DIAGNOSIS — H538 Other visual disturbances: Secondary | ICD-10-CM | POA: Diagnosis not present

## 2022-10-22 DIAGNOSIS — E1169 Type 2 diabetes mellitus with other specified complication: Secondary | ICD-10-CM | POA: Diagnosis not present

## 2022-10-22 DIAGNOSIS — R1031 Right lower quadrant pain: Secondary | ICD-10-CM | POA: Diagnosis not present

## 2022-10-22 DIAGNOSIS — G8918 Other acute postprocedural pain: Secondary | ICD-10-CM | POA: Diagnosis not present

## 2022-10-22 DIAGNOSIS — I152 Hypertension secondary to endocrine disorders: Secondary | ICD-10-CM | POA: Diagnosis not present

## 2022-10-23 DIAGNOSIS — R1031 Right lower quadrant pain: Secondary | ICD-10-CM | POA: Diagnosis not present

## 2022-10-23 DIAGNOSIS — G8918 Other acute postprocedural pain: Secondary | ICD-10-CM | POA: Diagnosis not present

## 2022-10-25 DIAGNOSIS — F4321 Adjustment disorder with depressed mood: Secondary | ICD-10-CM | POA: Diagnosis not present

## 2022-10-25 DIAGNOSIS — F4381 Prolonged grief disorder: Secondary | ICD-10-CM | POA: Diagnosis not present

## 2022-10-29 DIAGNOSIS — M25542 Pain in joints of left hand: Secondary | ICD-10-CM | POA: Diagnosis not present

## 2022-10-29 DIAGNOSIS — M25541 Pain in joints of right hand: Secondary | ICD-10-CM | POA: Diagnosis not present

## 2022-10-29 DIAGNOSIS — M154 Erosive (osteo)arthritis: Secondary | ICD-10-CM | POA: Diagnosis not present

## 2022-10-31 DIAGNOSIS — X32XXXA Exposure to sunlight, initial encounter: Secondary | ICD-10-CM | POA: Diagnosis not present

## 2022-10-31 DIAGNOSIS — L538 Other specified erythematous conditions: Secondary | ICD-10-CM | POA: Diagnosis not present

## 2022-10-31 DIAGNOSIS — L578 Other skin changes due to chronic exposure to nonionizing radiation: Secondary | ICD-10-CM | POA: Diagnosis not present

## 2022-10-31 DIAGNOSIS — L820 Inflamed seborrheic keratosis: Secondary | ICD-10-CM | POA: Diagnosis not present

## 2022-10-31 DIAGNOSIS — R208 Other disturbances of skin sensation: Secondary | ICD-10-CM | POA: Diagnosis not present

## 2022-10-31 DIAGNOSIS — D2261 Melanocytic nevi of right upper limb, including shoulder: Secondary | ICD-10-CM | POA: Diagnosis not present

## 2022-10-31 DIAGNOSIS — D2239 Melanocytic nevi of other parts of face: Secondary | ICD-10-CM | POA: Diagnosis not present

## 2022-11-01 DIAGNOSIS — F4321 Adjustment disorder with depressed mood: Secondary | ICD-10-CM | POA: Diagnosis not present

## 2022-11-01 DIAGNOSIS — F4381 Prolonged grief disorder: Secondary | ICD-10-CM | POA: Diagnosis not present

## 2022-11-05 DIAGNOSIS — Z955 Presence of coronary angioplasty implant and graft: Secondary | ICD-10-CM | POA: Diagnosis not present

## 2022-11-06 DIAGNOSIS — E119 Type 2 diabetes mellitus without complications: Secondary | ICD-10-CM | POA: Diagnosis not present

## 2022-11-07 DIAGNOSIS — Z955 Presence of coronary angioplasty implant and graft: Secondary | ICD-10-CM | POA: Diagnosis not present

## 2022-11-08 DIAGNOSIS — R011 Cardiac murmur, unspecified: Secondary | ICD-10-CM | POA: Diagnosis not present

## 2022-11-08 DIAGNOSIS — R0989 Other specified symptoms and signs involving the circulatory and respiratory systems: Secondary | ICD-10-CM | POA: Diagnosis not present

## 2022-11-12 DIAGNOSIS — Z955 Presence of coronary angioplasty implant and graft: Secondary | ICD-10-CM | POA: Diagnosis not present

## 2022-11-14 DIAGNOSIS — Z955 Presence of coronary angioplasty implant and graft: Secondary | ICD-10-CM | POA: Diagnosis not present

## 2022-11-15 DIAGNOSIS — F4381 Prolonged grief disorder: Secondary | ICD-10-CM | POA: Diagnosis not present

## 2022-11-15 DIAGNOSIS — F4321 Adjustment disorder with depressed mood: Secondary | ICD-10-CM | POA: Diagnosis not present

## 2022-11-18 DIAGNOSIS — M25541 Pain in joints of right hand: Secondary | ICD-10-CM | POA: Diagnosis not present

## 2022-11-18 DIAGNOSIS — M154 Erosive (osteo)arthritis: Secondary | ICD-10-CM | POA: Diagnosis not present

## 2022-11-18 DIAGNOSIS — M25542 Pain in joints of left hand: Secondary | ICD-10-CM | POA: Diagnosis not present

## 2022-11-19 DIAGNOSIS — Z955 Presence of coronary angioplasty implant and graft: Secondary | ICD-10-CM | POA: Diagnosis not present

## 2022-11-21 DIAGNOSIS — Z955 Presence of coronary angioplasty implant and graft: Secondary | ICD-10-CM | POA: Diagnosis not present

## 2022-11-22 DIAGNOSIS — Z794 Long term (current) use of insulin: Secondary | ICD-10-CM | POA: Diagnosis not present

## 2022-11-22 DIAGNOSIS — E66811 Obesity, class 1: Secondary | ICD-10-CM | POA: Diagnosis not present

## 2022-11-22 DIAGNOSIS — R0982 Postnasal drip: Secondary | ICD-10-CM | POA: Diagnosis not present

## 2022-11-22 DIAGNOSIS — E1142 Type 2 diabetes mellitus with diabetic polyneuropathy: Secondary | ICD-10-CM | POA: Diagnosis not present

## 2022-11-22 DIAGNOSIS — I10 Essential (primary) hypertension: Secondary | ICD-10-CM | POA: Diagnosis not present

## 2022-11-25 DIAGNOSIS — M25542 Pain in joints of left hand: Secondary | ICD-10-CM | POA: Diagnosis not present

## 2022-11-25 DIAGNOSIS — M25541 Pain in joints of right hand: Secondary | ICD-10-CM | POA: Diagnosis not present

## 2022-11-25 DIAGNOSIS — M154 Erosive (osteo)arthritis: Secondary | ICD-10-CM | POA: Diagnosis not present

## 2022-11-26 DIAGNOSIS — Z955 Presence of coronary angioplasty implant and graft: Secondary | ICD-10-CM | POA: Diagnosis not present

## 2022-11-28 DIAGNOSIS — Z955 Presence of coronary angioplasty implant and graft: Secondary | ICD-10-CM | POA: Diagnosis not present

## 2022-11-29 DIAGNOSIS — R072 Precordial pain: Secondary | ICD-10-CM | POA: Diagnosis not present

## 2022-11-29 DIAGNOSIS — Z951 Presence of aortocoronary bypass graft: Secondary | ICD-10-CM | POA: Diagnosis not present

## 2022-11-29 DIAGNOSIS — R9431 Abnormal electrocardiogram [ECG] [EKG]: Secondary | ICD-10-CM | POA: Diagnosis not present

## 2022-11-29 DIAGNOSIS — F4381 Prolonged grief disorder: Secondary | ICD-10-CM | POA: Diagnosis not present

## 2022-11-29 DIAGNOSIS — F4321 Adjustment disorder with depressed mood: Secondary | ICD-10-CM | POA: Diagnosis not present

## 2022-11-29 DIAGNOSIS — I10 Essential (primary) hypertension: Secondary | ICD-10-CM | POA: Diagnosis not present

## 2022-12-02 DIAGNOSIS — H524 Presbyopia: Secondary | ICD-10-CM | POA: Diagnosis not present

## 2022-12-03 DIAGNOSIS — Z955 Presence of coronary angioplasty implant and graft: Secondary | ICD-10-CM | POA: Diagnosis not present

## 2022-12-04 DIAGNOSIS — M154 Erosive (osteo)arthritis: Secondary | ICD-10-CM | POA: Diagnosis not present

## 2022-12-04 DIAGNOSIS — M25541 Pain in joints of right hand: Secondary | ICD-10-CM | POA: Diagnosis not present

## 2022-12-04 DIAGNOSIS — M25542 Pain in joints of left hand: Secondary | ICD-10-CM | POA: Diagnosis not present

## 2022-12-05 DIAGNOSIS — Z955 Presence of coronary angioplasty implant and graft: Secondary | ICD-10-CM | POA: Diagnosis not present

## 2022-12-06 DIAGNOSIS — F4321 Adjustment disorder with depressed mood: Secondary | ICD-10-CM | POA: Diagnosis not present

## 2022-12-06 DIAGNOSIS — F4381 Prolonged grief disorder: Secondary | ICD-10-CM | POA: Diagnosis not present

## 2022-12-09 DIAGNOSIS — M25542 Pain in joints of left hand: Secondary | ICD-10-CM | POA: Diagnosis not present

## 2022-12-09 DIAGNOSIS — M154 Erosive (osteo)arthritis: Secondary | ICD-10-CM | POA: Diagnosis not present

## 2022-12-09 DIAGNOSIS — M25541 Pain in joints of right hand: Secondary | ICD-10-CM | POA: Diagnosis not present

## 2022-12-10 DIAGNOSIS — Z955 Presence of coronary angioplasty implant and graft: Secondary | ICD-10-CM | POA: Diagnosis not present

## 2022-12-12 DIAGNOSIS — Z955 Presence of coronary angioplasty implant and graft: Secondary | ICD-10-CM | POA: Diagnosis not present

## 2022-12-13 DIAGNOSIS — F4381 Prolonged grief disorder: Secondary | ICD-10-CM | POA: Diagnosis not present

## 2022-12-13 DIAGNOSIS — F4321 Adjustment disorder with depressed mood: Secondary | ICD-10-CM | POA: Diagnosis not present

## 2022-12-16 DIAGNOSIS — Z6832 Body mass index (BMI) 32.0-32.9, adult: Secondary | ICD-10-CM | POA: Diagnosis not present

## 2022-12-16 DIAGNOSIS — M154 Erosive (osteo)arthritis: Secondary | ICD-10-CM | POA: Diagnosis not present

## 2022-12-16 DIAGNOSIS — D84821 Immunodeficiency due to drugs: Secondary | ICD-10-CM | POA: Diagnosis not present

## 2022-12-17 DIAGNOSIS — Z955 Presence of coronary angioplasty implant and graft: Secondary | ICD-10-CM | POA: Diagnosis not present

## 2022-12-23 DIAGNOSIS — M154 Erosive (osteo)arthritis: Secondary | ICD-10-CM | POA: Diagnosis not present

## 2022-12-23 DIAGNOSIS — M25542 Pain in joints of left hand: Secondary | ICD-10-CM | POA: Diagnosis not present

## 2022-12-23 DIAGNOSIS — M25541 Pain in joints of right hand: Secondary | ICD-10-CM | POA: Diagnosis not present

## 2022-12-24 DIAGNOSIS — Z955 Presence of coronary angioplasty implant and graft: Secondary | ICD-10-CM | POA: Diagnosis not present

## 2022-12-27 DIAGNOSIS — F4381 Prolonged grief disorder: Secondary | ICD-10-CM | POA: Diagnosis not present

## 2022-12-27 DIAGNOSIS — F4321 Adjustment disorder with depressed mood: Secondary | ICD-10-CM | POA: Diagnosis not present

## 2022-12-30 DIAGNOSIS — I10 Essential (primary) hypertension: Secondary | ICD-10-CM | POA: Diagnosis not present

## 2022-12-30 DIAGNOSIS — E119 Type 2 diabetes mellitus without complications: Secondary | ICD-10-CM | POA: Diagnosis not present

## 2022-12-30 DIAGNOSIS — R7989 Other specified abnormal findings of blood chemistry: Secondary | ICD-10-CM | POA: Diagnosis not present

## 2022-12-30 DIAGNOSIS — E1142 Type 2 diabetes mellitus with diabetic polyneuropathy: Secondary | ICD-10-CM | POA: Diagnosis not present

## 2022-12-30 DIAGNOSIS — Z888 Allergy status to other drugs, medicaments and biological substances status: Secondary | ICD-10-CM | POA: Diagnosis not present

## 2022-12-30 DIAGNOSIS — K219 Gastro-esophageal reflux disease without esophagitis: Secondary | ICD-10-CM | POA: Diagnosis not present

## 2022-12-30 DIAGNOSIS — Z880 Allergy status to penicillin: Secondary | ICD-10-CM | POA: Diagnosis not present

## 2022-12-30 DIAGNOSIS — Z794 Long term (current) use of insulin: Secondary | ICD-10-CM | POA: Diagnosis not present

## 2022-12-30 DIAGNOSIS — I2510 Atherosclerotic heart disease of native coronary artery without angina pectoris: Secondary | ICD-10-CM | POA: Diagnosis not present

## 2022-12-30 DIAGNOSIS — I252 Old myocardial infarction: Secondary | ICD-10-CM | POA: Diagnosis not present

## 2022-12-30 DIAGNOSIS — E785 Hyperlipidemia, unspecified: Secondary | ICD-10-CM | POA: Diagnosis not present

## 2022-12-30 DIAGNOSIS — Z79899 Other long term (current) drug therapy: Secondary | ICD-10-CM | POA: Diagnosis not present

## 2022-12-30 DIAGNOSIS — R0600 Dyspnea, unspecified: Secondary | ICD-10-CM | POA: Diagnosis not present

## 2022-12-30 DIAGNOSIS — R0789 Other chest pain: Secondary | ICD-10-CM | POA: Diagnosis not present

## 2022-12-30 DIAGNOSIS — R0602 Shortness of breath: Secondary | ICD-10-CM | POA: Diagnosis not present

## 2022-12-30 DIAGNOSIS — Z7982 Long term (current) use of aspirin: Secondary | ICD-10-CM | POA: Diagnosis not present

## 2022-12-30 DIAGNOSIS — R9431 Abnormal electrocardiogram [ECG] [EKG]: Secondary | ICD-10-CM | POA: Diagnosis not present

## 2022-12-30 DIAGNOSIS — R0902 Hypoxemia: Secondary | ICD-10-CM | POA: Diagnosis not present

## 2022-12-31 DIAGNOSIS — I2510 Atherosclerotic heart disease of native coronary artery without angina pectoris: Secondary | ICD-10-CM | POA: Diagnosis not present

## 2022-12-31 DIAGNOSIS — R0602 Shortness of breath: Secondary | ICD-10-CM | POA: Diagnosis not present

## 2023-01-04 DIAGNOSIS — Z1231 Encounter for screening mammogram for malignant neoplasm of breast: Secondary | ICD-10-CM | POA: Diagnosis not present

## 2023-01-08 DIAGNOSIS — M25541 Pain in joints of right hand: Secondary | ICD-10-CM | POA: Diagnosis not present

## 2023-01-08 DIAGNOSIS — M154 Erosive (osteo)arthritis: Secondary | ICD-10-CM | POA: Diagnosis not present

## 2023-01-08 DIAGNOSIS — M25542 Pain in joints of left hand: Secondary | ICD-10-CM | POA: Diagnosis not present

## 2023-01-10 DIAGNOSIS — F4321 Adjustment disorder with depressed mood: Secondary | ICD-10-CM | POA: Diagnosis not present

## 2023-01-10 DIAGNOSIS — I2510 Atherosclerotic heart disease of native coronary artery without angina pectoris: Secondary | ICD-10-CM | POA: Diagnosis not present

## 2023-01-10 DIAGNOSIS — E119 Type 2 diabetes mellitus without complications: Secondary | ICD-10-CM | POA: Diagnosis not present

## 2023-01-10 DIAGNOSIS — Z955 Presence of coronary angioplasty implant and graft: Secondary | ICD-10-CM | POA: Diagnosis not present

## 2023-01-10 DIAGNOSIS — R0602 Shortness of breath: Secondary | ICD-10-CM | POA: Diagnosis not present

## 2023-01-10 DIAGNOSIS — F4381 Prolonged grief disorder: Secondary | ICD-10-CM | POA: Diagnosis not present

## 2023-01-16 DIAGNOSIS — Z955 Presence of coronary angioplasty implant and graft: Secondary | ICD-10-CM | POA: Diagnosis not present

## 2023-03-14 IMAGING — MR MR SHOULDER*L* W/O CM
5 series · 40 of 40 positions shown · non-contrast
Comparison: Left shoulder x-rays dated April 03, 2020.

CLINICAL DATA: Chronic left shoulder pain and limited range of
motion. No prior surgery.

EXAM:
MRI OF THE LEFT SHOULDER WITHOUT CONTRAST
TECHNIQUE: Multiplanar, multisequence MR imaging of the shoulder was performed.
No intravenous contrast was administered.

[Series 3: T2 fat-sat · axial · 4.0mm · 0.27mm/px · z∈[-102,+35]mm · 10 of 30 slices shown (1 of 3)]
[im 1/30]
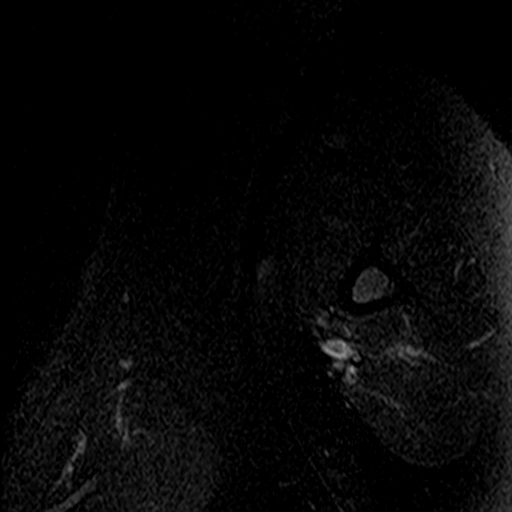
[im 4/30]
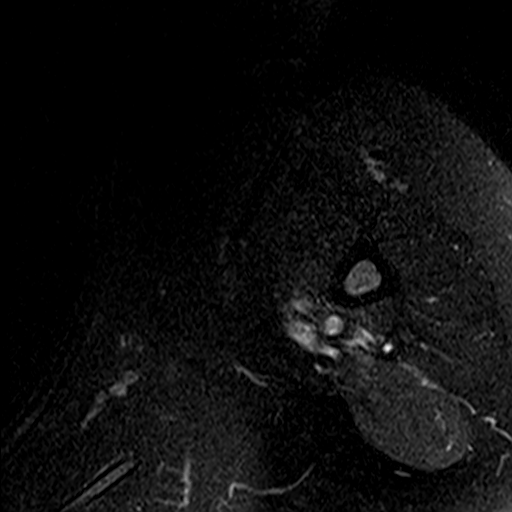
[im 7/30]
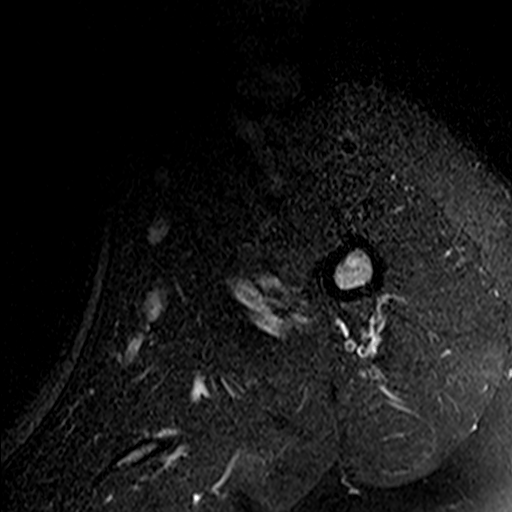
[im 10/30]
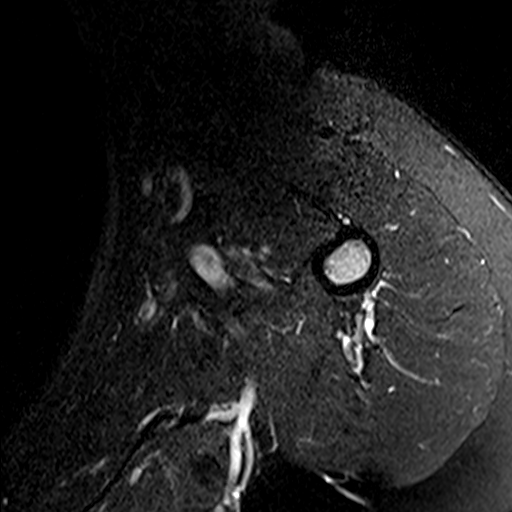
[im 13/30]
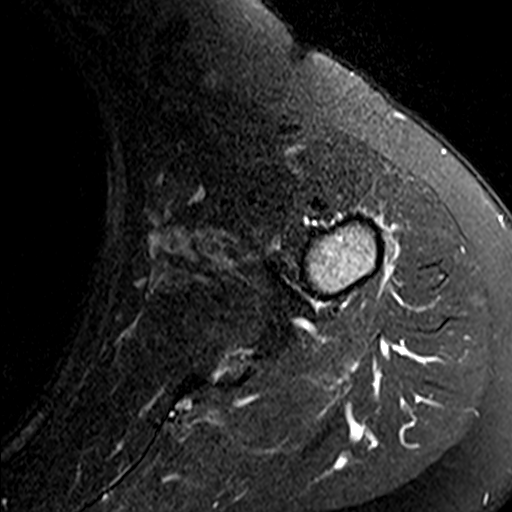
[im 17/30]
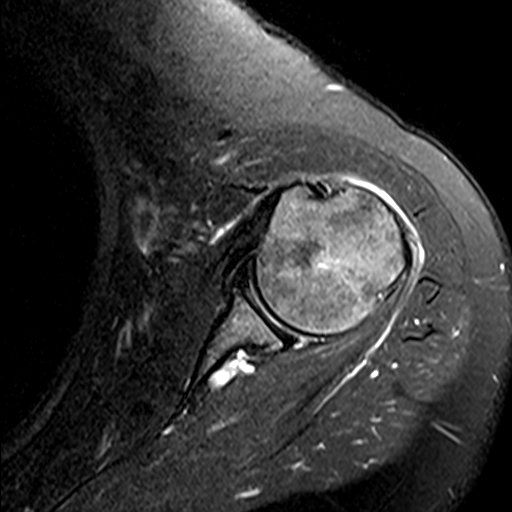
[im 20/30]
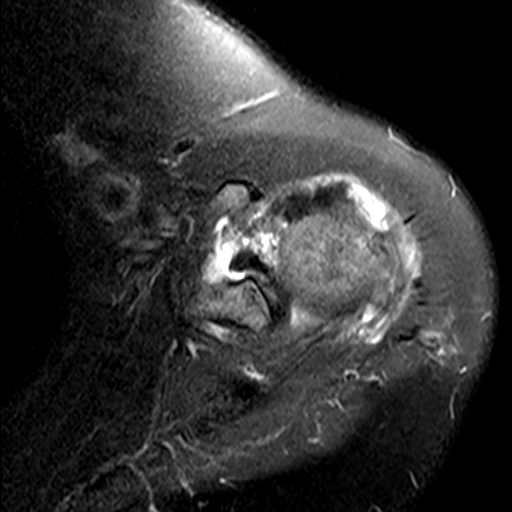
[im 23/30]
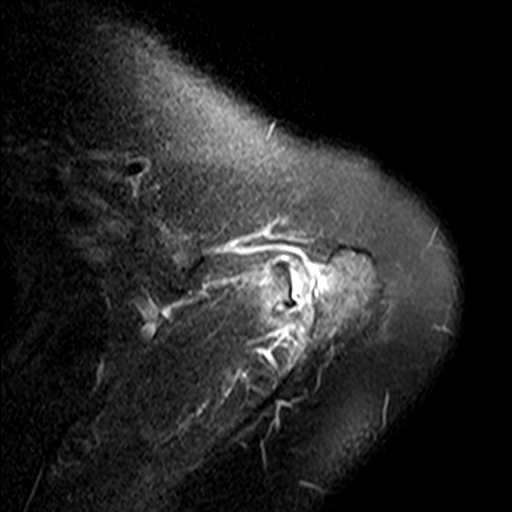
[im 26/30]
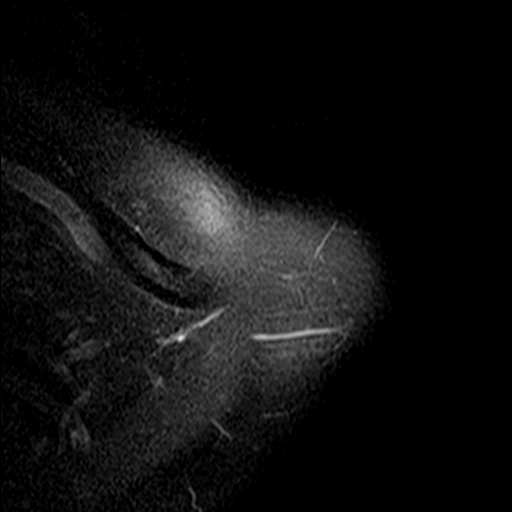
[im 30/30]
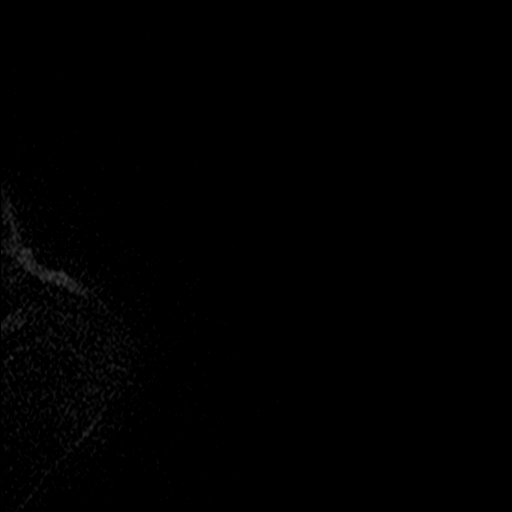

[Series 4: T2 fat-sat · oblique · 4.0mm · 0.59mm/px · 7 of 18 slices shown (2 of 3)]
[im 1/18]
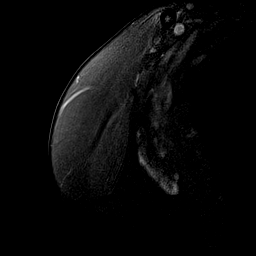
[im 3/18]
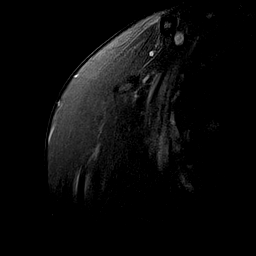
[im 6/18]
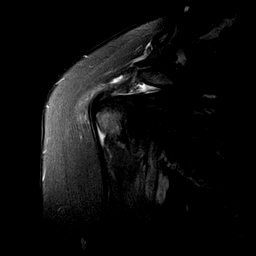
[im 9/18]
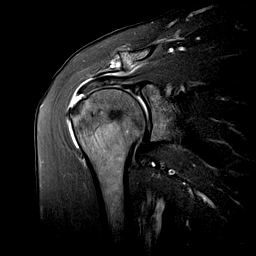
[im 12/18]
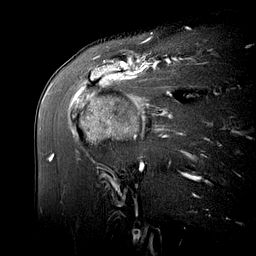
[im 15/18]
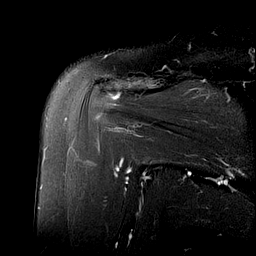
[im 18/18]
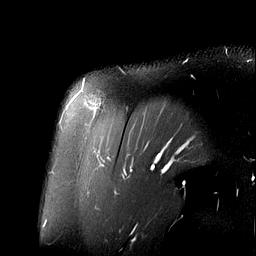

[Series 5: PD · oblique · 4.0mm · 0.59mm/px · 7 of 18 slices shown]
[im 1/18]
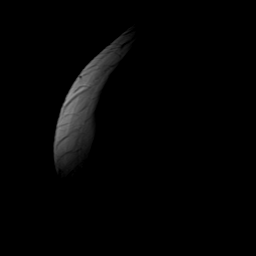
[im 3/18]
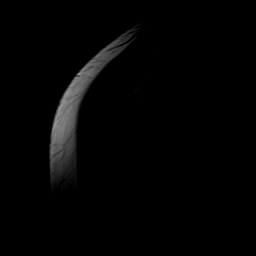
[im 6/18]
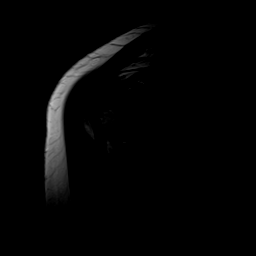
[im 9/18]
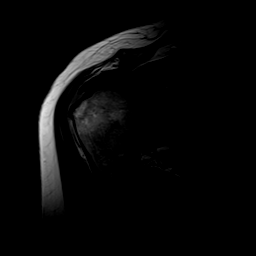
[im 12/18]
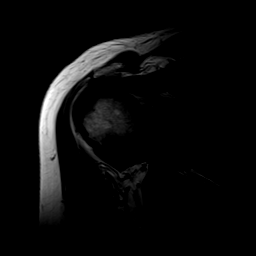
[im 15/18]
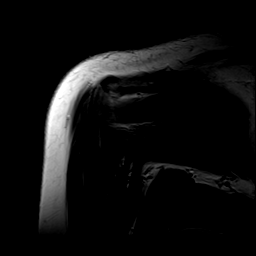
[im 18/18]
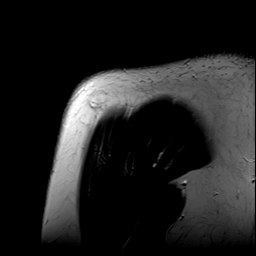

[Series 6: T2 fat-sat · oblique · 4.0mm · 0.59mm/px · 8 of 20 slices shown (3 of 3)]
[im 1/20]
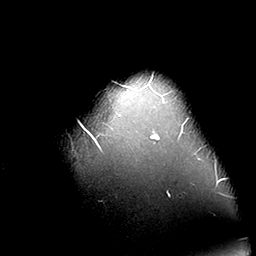
[im 3/20]
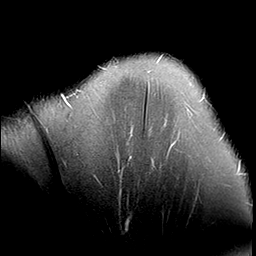
[im 6/20]
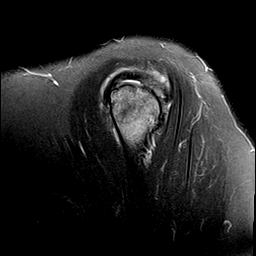
[im 9/20]
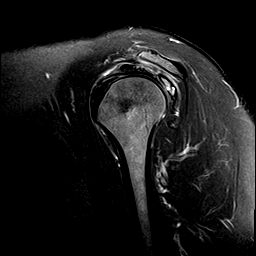
[im 11/20]
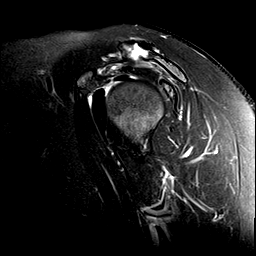
[im 14/20]
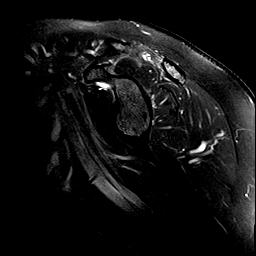
[im 17/20]
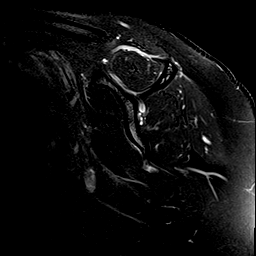
[im 20/20]
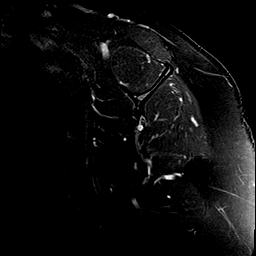

[Series 7: T1 · oblique · 4.0mm · 0.59mm/px · 8 of 20 slices shown]
[im 1/20]
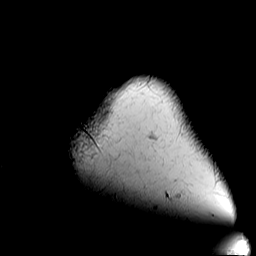
[im 3/20]
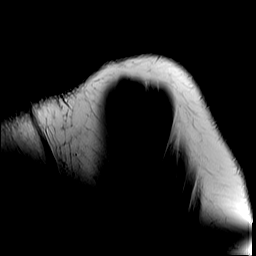
[im 6/20]
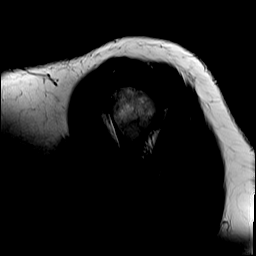
[im 9/20]
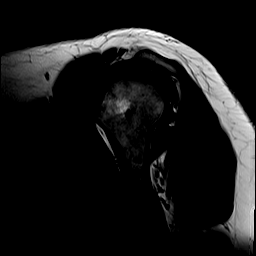
[im 11/20]
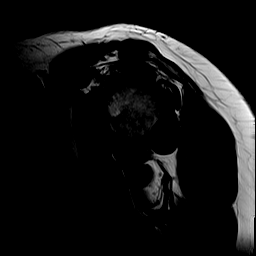
[im 14/20]
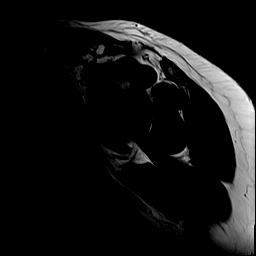
[im 17/20]
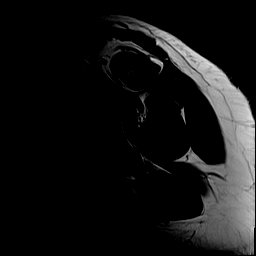
[im 20/20]
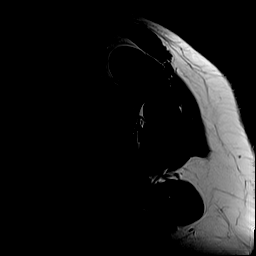

[40 of 40 positions shown; findings below may reference images not displayed]

FINDINGS: Rotator cuff: Moderate supraspinatus tendinosis with high-grade near
full-thickness bursal surface tear at the insertion (series 4, image
9), measuring 1.9 cm in AP dimension. Moderate infraspinatus
tendinosis. Mild subscapularis tendinosis. The teres minor tendon is
unremarkable.

Muscles: No atrophy or abnormal signal of the muscles of the rotator
cuff.

Biceps long head:  Intact and normally positioned.

Acromioclavicular Joint: Mild arthropathy of the acromioclavicular
joint. Type II acromion. Trace subacromial/subdeltoid bursal fluid.

Glenohumeral Joint: No joint effusion. No chondral defect.

Labrum: Grossly intact, but evaluation is limited by lack of
intraarticular fluid. Superior labral degeneration.

Bones:  No marrow abnormality, fracture or dislocation.

Other: None.
IMPRESSION: 1. Moderate supraspinatus tendinosis with high-grade near
full-thickness bursal surface tear at the insertion.
2. Moderate infraspinatus and mild subscapularis tendinosis.
3. Mild acromioclavicular osteoarthritis.
